# Patient Record
Sex: Female | Born: 1980 | Race: Black or African American | Hispanic: No | Smoking: Current every day smoker
Health system: Southern US, Community
[De-identification: ages and names within clinical notes are randomized; demographics above are authoritative.]

## PROBLEM LIST (undated history)

## (undated) DIAGNOSIS — Z8739 Personal history of other diseases of the musculoskeletal system and connective tissue: Secondary | ICD-10-CM

## (undated) DIAGNOSIS — Z862 Personal history of diseases of the blood and blood-forming organs and certain disorders involving the immune mechanism: Secondary | ICD-10-CM

## (undated) DIAGNOSIS — Z720 Tobacco use: Secondary | ICD-10-CM

## (undated) DIAGNOSIS — F1010 Alcohol abuse, uncomplicated: Secondary | ICD-10-CM

## (undated) DIAGNOSIS — E876 Hypokalemia: Secondary | ICD-10-CM

## (undated) DIAGNOSIS — I428 Other cardiomyopathies: Secondary | ICD-10-CM

## (undated) DIAGNOSIS — I4729 Other ventricular tachycardia: Secondary | ICD-10-CM

## (undated) DIAGNOSIS — D72819 Decreased white blood cell count, unspecified: Secondary | ICD-10-CM

## (undated) DIAGNOSIS — D709 Neutropenia, unspecified: Secondary | ICD-10-CM

## (undated) DIAGNOSIS — J45909 Unspecified asthma, uncomplicated: Secondary | ICD-10-CM

## (undated) DIAGNOSIS — R7401 Elevation of levels of liver transaminase levels: Secondary | ICD-10-CM

## (undated) DIAGNOSIS — I469 Cardiac arrest, cause unspecified: Secondary | ICD-10-CM

## (undated) DIAGNOSIS — Z9289 Personal history of other medical treatment: Secondary | ICD-10-CM

## (undated) HISTORY — DX: Personal history of other diseases of the musculoskeletal system and connective tissue: Z87.39

## (undated) HISTORY — DX: Personal history of other medical treatment: Z92.89

## (undated) HISTORY — DX: Decreased white blood cell count, unspecified: D72.819

## (undated) HISTORY — DX: Unspecified asthma, uncomplicated: J45.909

## (undated) HISTORY — DX: Cardiac arrest, cause unspecified: I46.9

## (undated) HISTORY — DX: Neutropenia, unspecified: D70.9

## (undated) HISTORY — DX: Personal history of diseases of the blood and blood-forming organs and certain disorders involving the immune mechanism: Z86.2

## (undated) HISTORY — DX: Other cardiomyopathies: I42.8

## (undated) MED FILL — Iron Sucrose Inj 20 MG/ML (Fe Equiv): INTRAVENOUS | Qty: 10 | Status: AC

---
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
## 2013-07-08 ENCOUNTER — Emergency Department: Payer: Self-pay | Admitting: Emergency Medicine

## 2014-09-27 ENCOUNTER — Ambulatory Visit
Admit: 2014-09-27 | Disposition: A | Discharge status: Home / Self Care | Payer: Self-pay | Attending: Internal Medicine | Admitting: Internal Medicine

## 2014-10-11 ENCOUNTER — Ambulatory Visit
Admit: 2014-10-11 | Disposition: A | Discharge status: Home / Self Care | Payer: Self-pay | Attending: Internal Medicine | Admitting: Internal Medicine

## 2015-01-29 ENCOUNTER — Inpatient Hospital Stay: Payer: Medicaid Other

## 2015-02-03 ENCOUNTER — Inpatient Hospital Stay: Payer: Medicaid Other | Attending: Internal Medicine

## 2015-02-03 ENCOUNTER — Other Ambulatory Visit: Payer: Self-pay

## 2015-02-03 DIAGNOSIS — D72819 Decreased white blood cell count, unspecified: Secondary | ICD-10-CM | POA: Insufficient documentation

## 2015-03-06 ENCOUNTER — Other Ambulatory Visit: Payer: Medicaid Other

## 2015-03-20 ENCOUNTER — Inpatient Hospital Stay: Payer: Medicaid Other | Attending: Internal Medicine

## 2015-03-20 DIAGNOSIS — D709 Neutropenia, unspecified: Secondary | ICD-10-CM | POA: Diagnosis not present

## 2015-03-20 DIAGNOSIS — R700 Elevated erythrocyte sedimentation rate: Secondary | ICD-10-CM | POA: Insufficient documentation

## 2015-03-20 DIAGNOSIS — D72819 Decreased white blood cell count, unspecified: Secondary | ICD-10-CM | POA: Insufficient documentation

## 2015-03-20 LAB — CBC
HCT: 42.1 % (ref 35.0–47.0)
HEMOGLOBIN: 14 g/dL (ref 12.0–16.0)
MCH: 30 pg (ref 26.0–34.0)
MCHC: 33.2 g/dL (ref 32.0–36.0)
MCV: 90.3 fL (ref 80.0–100.0)
Platelets: 144 10*3/uL — ABNORMAL LOW (ref 150–440)
RBC: 4.66 MIL/uL (ref 3.80–5.20)
RDW: 13.9 % (ref 11.5–14.5)
WBC: 2.4 10*3/uL — AB (ref 3.6–11.0)

## 2015-03-20 LAB — LACTATE DEHYDROGENASE: LDH: 228 U/L — AB (ref 98–192)

## 2015-03-26 ENCOUNTER — Telehealth: Payer: Self-pay | Admitting: *Deleted

## 2015-03-26 NOTE — Telephone Encounter (Signed)
Labs from 03/20/15 continues to show mild leukopenia, looks like differential was not done. Also serum LDH is just above normal range but is better than March 2016. If patient is clinically doing well without any fevers, night sweats, unintentional weight loss, enlarged lymph node masses on self exam, recurrent infections, then plan is continued monitoring and keep scheduled appointments the same. Otherwise let me know and we will need to see her. Thanks.

## 2015-03-27 NOTE — Telephone Encounter (Signed)
I will need to see her and discuss getting workup (bone marrow biopsy and CT scan) to r/o lymphoma. Please make appt for tomorrow. Thanks.

## 2015-03-27 NOTE — Telephone Encounter (Signed)
Pt reports that she is having "alot and I mean a lot of night sweats. I have to get up and shower. I am  Also itching a lot" Denies fevers.

## 2015-03-27 NOTE — Telephone Encounter (Signed)
Called and left message that her appt has been changed to tomorrow at 915 and asked that she return  My call to confirm

## 2015-03-27 NOTE — Telephone Encounter (Signed)
I have called patient again and still get vm

## 2015-03-27 NOTE — Telephone Encounter (Signed)
Patient reports that she has transportation issues and uses [facility]. I offered her a $10 gas voucher if she could get someone to bring her to the appt and she got her neighbor to agree to bring her

## 2015-03-28 ENCOUNTER — Other Ambulatory Visit: Payer: Medicaid Other

## 2015-03-28 ENCOUNTER — Inpatient Hospital Stay: Payer: Medicaid Other

## 2015-03-28 ENCOUNTER — Inpatient Hospital Stay: Payer: Medicaid Other | Admitting: Internal Medicine

## 2015-03-28 ENCOUNTER — Ambulatory Visit: Payer: Medicaid Other | Admitting: Internal Medicine

## 2015-04-02 ENCOUNTER — Ambulatory Visit: Payer: Medicaid Other | Admitting: Internal Medicine

## 2015-04-02 ENCOUNTER — Other Ambulatory Visit: Payer: Medicaid Other

## 2015-04-18 ENCOUNTER — Inpatient Hospital Stay: Payer: Medicaid Other | Attending: Internal Medicine | Admitting: Internal Medicine

## 2015-04-18 ENCOUNTER — Inpatient Hospital Stay: Payer: Medicaid Other

## 2015-04-18 VITALS — BP 108/76 | HR 79 | Temp 96.7°F | Resp 18 | Ht 62.0 in | Wt 95.9 lb

## 2015-04-18 DIAGNOSIS — R700 Elevated erythrocyte sedimentation rate: Secondary | ICD-10-CM | POA: Insufficient documentation

## 2015-04-18 DIAGNOSIS — Z79899 Other long term (current) drug therapy: Secondary | ICD-10-CM | POA: Insufficient documentation

## 2015-04-18 DIAGNOSIS — D890 Polyclonal hypergammaglobulinemia: Secondary | ICD-10-CM

## 2015-04-18 DIAGNOSIS — F1010 Alcohol abuse, uncomplicated: Secondary | ICD-10-CM | POA: Insufficient documentation

## 2015-04-18 DIAGNOSIS — D649 Anemia, unspecified: Secondary | ICD-10-CM | POA: Insufficient documentation

## 2015-04-18 DIAGNOSIS — M303 Mucocutaneous lymph node syndrome [Kawasaki]: Secondary | ICD-10-CM | POA: Diagnosis not present

## 2015-04-18 DIAGNOSIS — R740 Nonspecific elevation of levels of transaminase and lactic acid dehydrogenase [LDH]: Secondary | ICD-10-CM | POA: Insufficient documentation

## 2015-04-18 DIAGNOSIS — R918 Other nonspecific abnormal finding of lung field: Secondary | ICD-10-CM | POA: Insufficient documentation

## 2015-04-18 DIAGNOSIS — J45909 Unspecified asthma, uncomplicated: Secondary | ICD-10-CM | POA: Insufficient documentation

## 2015-04-18 DIAGNOSIS — F17210 Nicotine dependence, cigarettes, uncomplicated: Secondary | ICD-10-CM | POA: Insufficient documentation

## 2015-04-18 DIAGNOSIS — R634 Abnormal weight loss: Secondary | ICD-10-CM

## 2015-04-18 DIAGNOSIS — R61 Generalized hyperhidrosis: Secondary | ICD-10-CM | POA: Insufficient documentation

## 2015-04-18 DIAGNOSIS — D72819 Decreased white blood cell count, unspecified: Secondary | ICD-10-CM | POA: Insufficient documentation

## 2015-04-18 LAB — HEPATIC FUNCTION PANEL
ALBUMIN: 4.3 g/dL (ref 3.5–5.0)
ALT: 42 U/L (ref 14–54)
AST: 121 U/L — AB (ref 15–41)
Alkaline Phosphatase: 75 U/L (ref 38–126)
Bilirubin, Direct: 0.2 mg/dL (ref 0.1–0.5)
Indirect Bilirubin: 0.3 mg/dL (ref 0.3–0.9)
TOTAL PROTEIN: 8.1 g/dL (ref 6.5–8.1)
Total Bilirubin: 0.5 mg/dL (ref 0.3–1.2)

## 2015-04-18 LAB — LACTATE DEHYDROGENASE: LDH: 192 U/L (ref 98–192)

## 2015-04-18 LAB — BASIC METABOLIC PANEL
ANION GAP: 8 (ref 5–15)
BUN: 6 mg/dL (ref 6–20)
CALCIUM: 8.4 mg/dL — AB (ref 8.9–10.3)
CO2: 25 mmol/L (ref 22–32)
CREATININE: 0.57 mg/dL (ref 0.44–1.00)
Chloride: 101 mmol/L (ref 101–111)
GFR calc Af Amer: >60 mL/min (ref >60)
GLUCOSE: 82 mg/dL (ref 65–99)
Potassium: 3.9 mmol/L (ref 3.5–5.1)
Sodium: 134 mmol/L — ABNORMAL LOW (ref 135–145)

## 2015-04-18 LAB — CBC WITH DIFFERENTIAL/PLATELET
BASOS ABS: 0 10*3/uL (ref 0–0.1)
Basophils Relative: 1 %
EOS ABS: 0.1 10*3/uL (ref 0–0.7)
Eosinophils Relative: 3 %
HCT: 38.7 % (ref 35.0–47.0)
HEMOGLOBIN: 13 g/dL (ref 12.0–16.0)
LYMPHS ABS: 0.7 10*3/uL — AB (ref 1.0–3.6)
Lymphocytes Relative: 31 %
MCH: 29.9 pg (ref 26.0–34.0)
MCHC: 33.5 g/dL (ref 32.0–36.0)
MCV: 89.1 fL (ref 80.0–100.0)
Monocytes Absolute: 0.3 10*3/uL (ref 0.2–0.9)
Monocytes Relative: 16 %
NEUTROS PCT: 49 %
Neutro Abs: 1.1 10*3/uL — ABNORMAL LOW (ref 1.4–6.5)
Platelets: 128 10*3/uL — ABNORMAL LOW (ref 150–440)
RBC: 4.34 MIL/uL (ref 3.80–5.20)
RDW: 13.9 % (ref 11.5–14.5)
WBC: 2.1 10*3/uL — AB (ref 3.6–11.0)

## 2015-04-18 LAB — FOLATE: FOLATE: 7.3 ng/mL (ref >5.9)

## 2015-04-18 LAB — IRON AND TIBC
IRON: 97 ug/dL (ref 28–170)
SATURATION RATIOS: 27 % (ref 10.4–31.8)
TIBC: 351 ug/dL (ref 250–450)
UIBC: 256 ug/dL

## 2015-04-18 LAB — FERRITIN: FERRITIN: 107 ng/mL (ref 11–307)

## 2015-04-18 LAB — RETICULOCYTES
RBC.: 4.34 MIL/uL (ref 3.80–5.20)
RETIC COUNT ABSOLUTE: 43.4 10*3/uL (ref 19.0–183.0)
RETIC CT PCT: 1 % (ref 0.4–3.1)

## 2015-04-18 LAB — SEDIMENTATION RATE: SED RATE: 5 mm/h (ref 0–20)

## 2015-04-18 LAB — PREGNANCY, URINE: Preg Test, Ur: NEGATIVE

## 2015-04-18 NOTE — Progress Notes (Signed)
[facility]  Telephone:([phone]) [phone] Fax:(336) [phone]     ID: [name] OB: [birth year]  MR#: [id number]  OZH#:[id number]  Patient Care Team: Colleen Can, MD as PCP - General (Internal Medicine)  CHIEF COMPLAINT/DIAGNOSIS:  Persistent unexplained Leukopenia, Mild Neutropenia. Also found to have mildly elevated ESR of 66, C-reactive protein at 6.3 and serum copper at 181 (ref range 72-166) -  possible etiologies include secondary to regular alcohol intake, ?possible component of constitutional leukopenia/neutropenia. 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 Serum ceruloplasmin level in normal range. 10/28/14 - US Abdomen. IMPRESSION: 1. No splenomegaly. No focal splenic abnormality. Normal abdominal ultrasound.  [prior labs on 06/17/14 reported WBC low at 2600, 55% neutrophils, ANC 1400, 33% lymphocytes, ALC 1900, 10% monos, AMC 300, 2% eos, 0% basos, Hb 13.0, MCV 79, platelets 239, HBsAg negative, HCV Ab negative,  ESR of 66, C-reactive protein at 6.3, serum copper at 181 (ref range 72-166), ANA negative. On 05/30/14, WBC 2200, Hb 13.1, platelets 220, ANC 1200, ALC low at 500, Cr 0.7, LFTs unremarkable, albumin 4.5, HIV Ab nonreactive. On 06/03/14, WBC 2400, Hb 12.1, platelets 208, ANC 1200, normal B12 and folate level].   HPI:   Patient returns for continued hematology follow-up, she had CBC done on September 8 which had continued to show elevated serum LDH along with persistent leukopenia. At that time, patient had been contacted over telephone and had reported she was having major night sweats, she could not come for follow-up appointment until today due to transportation issues. States that she continues to have severe night sweats, appetite is steady but she does not gain much weight, feels that she may have lost at least 6 pounds in the last few months.  She continues to take alcohol, mostly beer, on a regular basis. She has chronic fatigue on exertion. Denies any fevers, chills or recurrent infections. Denies any new skin rashes, joint redness or swelling although sometimes has some stiffness and discomfort in joints of her hands. No recurrent mouth sores or thrush.     Review of Systems   As in HPI above. In addition, no new headaches.  No earache or discharge. No sore throat or dysphagia. No abdominal pain, constipation, diarrhea, dysuria or hematuria. No new bone pain. Appetite is stable. No new paresthesias in extremities.    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 Otherwise remarkable for breast cancer. Denies hematological disorders including leukopenia/neutropenia.    Social History - smokes about 3 cigarettes per day 15 years now. Drinks beer every day, 40 oz and 24 oz cans. Denies recreational drug usage. Physically active.     ROS  No Known Allergies  No current outpatient prescriptions on file.   No current facility-administered medications for this visit.    PHYSICAL EXAM: Filed Vitals:   04/18/15 1211  BP: 108/76  Pulse: 79  Temp: 96.7 F (35.9 C)  Resp: 18     Body mass index is 17.54 kg/(m^2).      GENERAL: Patient is thin individual, alert and oriented and in no acute distress. There is no icterus. HEENT: EOMs intact. No cervical lymphadenopathy. CVS: S1S2, regular LUNGS: Bilaterally clear to auscultation, no rhonchi. ABDOMEN: Soft, nontender. No hepatosplenomegaly clinically.  NEURO: grossly nonfocal, cranial nerves are intact.   EXTREMITIES: No pedal edema. LYMPHATICS: No palpable adenopathy in axillary or inguinal areas.   LAB RESULTS: Lab Results  Component Value Date   WBC 2.1* 04/18/2015   NEUTROABS 1.1* 04/18/2015   HGB 13.0 04/18/2015    HCT 38.7 04/18/2015   MCV 89.1 04/18/2015   PLT 128* 04/18/2015   ANC 1.1, ALC 0.7   STUDIES: 06/17/14 - WBC low at 2600, 55% neutrophils, ANC 1400, 33% lymphocytes, ALC 1900, 10% monos, AMC 300, 2% eos, 0% basos, Hb 13.0, MCV 79, platelets 239, HBsAg negative, HCV Ab negative,  ESR of 66, C-reactive protein at 6.3, serum copper at 181 (ref range 72-166), ANA negative.  05/30/14 - WBC 2200, Hb 13.1, platelets 220, ANC 1200, ALC low at 500, Cr 0.7, LFTs unremarkable, albumin 4.5, HIV Ab nonreactive.  06/03/14 - WBC 2400, Hb 12.1, platelets 208, ANC 1200, normal B12 and folate level.    ASSESSMENT / PLAN:   1. Persistent unexplained Leukopenia, Mild Neutropenia. Also found to have mildly elevated ESR of 66, C-reactive protein at 6.3 and serum copper at 181 (ref range 72-166) -  possible etiologies include secondary to regular alcohol intake, possible component of constitutional leukopenia/neutropenia versus other. 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 Serum ceruloplasmin level in normal range. US Abdomen. IMPRESSION: 1. No splenomegaly. No focal splenic abnormality. Normal abdominal ultrasound. Peripheral blood flow cytometry was unremarkable   -     patient continues to have persistent leukopenia, mild neutropenia, also has mild thrombocytopenia. Have explained that these could be secondary to alcohol intake and have advised her to quit alcohol completely. Also, she has unexplained major night sweats and unintentional weight loss along with elevated LDH. Will send additional workup today, get CT scan of the chest/abdomen/pelvis to evaluate for possibility of underlying lymphoma, plan bone marrow biopsy next week to evaluate for lymphoma or other etiology. She will then return for follow-up in one week after bone marrow biopsy is done to discuss results of workup and make further plan of  management  2. H/o Elevated serum Copper level -  serum ceruloplasmin level was unremarkable. Repeat serum copper level in April 2016 normal at 154.  3. Also given elevated ESR and C-reactive protein along with prior history of Kawasaki's disease, recommend Rheumatology evaluation, patient states that she wants this to be determined by her primary physician.     4. In between visits, the patient has been advised to call or come to the ER in case of fevers, chills, bleeding, acute sickness or new symptoms.          Patient is agreeable to above plan.   Leia Alf, MD   04/18/2015 2:12 PM

## 2015-04-18 NOTE — Progress Notes (Signed)
Patient is here for follow-up of leukopenia. She states that she is very fatigued and stays this way. She states that her appetite is not that great and could be much better. She states that she has night sweats just about every night.

## 2015-04-19 LAB — VITAMIN B12: VITAMIN B 12: 487 pg/mL (ref 180–914)

## 2015-04-22 ENCOUNTER — Encounter: Payer: Self-pay | Admitting: *Deleted

## 2015-04-24 ENCOUNTER — Ambulatory Visit
Admission: RE | Admit: 2015-04-24 | Discharge: 2015-04-24 | Disposition: A | Discharge status: Home / Self Care | Payer: Medicaid Other | Source: Ambulatory Visit | Attending: Internal Medicine | Admitting: Internal Medicine

## 2015-04-24 DIAGNOSIS — D72819 Decreased white blood cell count, unspecified: Secondary | ICD-10-CM | POA: Insufficient documentation

## 2015-04-24 DIAGNOSIS — J439 Emphysema, unspecified: Secondary | ICD-10-CM | POA: Insufficient documentation

## 2015-04-24 DIAGNOSIS — R918 Other nonspecific abnormal finding of lung field: Secondary | ICD-10-CM | POA: Insufficient documentation

## 2015-04-24 MED ORDER — IOHEXOL 300 MG/ML  SOLN: 75.0000 mL | Freq: Once | INTRAMUSCULAR | Status: DC | PRN

## 2015-04-25 ENCOUNTER — Other Ambulatory Visit: Payer: Self-pay | Admitting: *Deleted

## 2015-04-25 ENCOUNTER — Inpatient Hospital Stay (HOSPITAL_BASED_OUTPATIENT_CLINIC_OR_DEPARTMENT_OTHER): Payer: Medicaid Other | Admitting: Internal Medicine

## 2015-04-25 ENCOUNTER — Inpatient Hospital Stay: Payer: Medicaid Other

## 2015-04-25 ENCOUNTER — Encounter: Payer: Self-pay | Admitting: Internal Medicine

## 2015-04-25 VITALS — Temp 97.8°F | Resp 16 | Ht 62.0 in | Wt 94.0 lb

## 2015-04-25 DIAGNOSIS — Z79899 Other long term (current) drug therapy: Secondary | ICD-10-CM

## 2015-04-25 DIAGNOSIS — D72819 Decreased white blood cell count, unspecified: Secondary | ICD-10-CM

## 2015-04-25 DIAGNOSIS — R740 Nonspecific elevation of levels of transaminase and lactic acid dehydrogenase [LDH]: Secondary | ICD-10-CM

## 2015-04-25 DIAGNOSIS — R700 Elevated erythrocyte sedimentation rate: Secondary | ICD-10-CM | POA: Diagnosis not present

## 2015-04-25 DIAGNOSIS — D649 Anemia, unspecified: Secondary | ICD-10-CM

## 2015-04-25 DIAGNOSIS — D708 Other neutropenia: Secondary | ICD-10-CM

## 2015-04-25 LAB — CBC WITH DIFFERENTIAL/PLATELET
Basophils Absolute: 0 10*3/uL (ref 0–0.1)
Basophils Relative: 1 %
EOS PCT: 2 %
Eosinophils Absolute: 0 10*3/uL (ref 0–0.7)
HEMATOCRIT: 41.9 % (ref 35.0–47.0)
HEMOGLOBIN: 13.9 g/dL (ref 12.0–16.0)
LYMPHS ABS: 0.7 10*3/uL — AB (ref 1.0–3.6)
LYMPHS PCT: 28 %
MCH: 29.5 pg (ref 26.0–34.0)
MCHC: 33.1 g/dL (ref 32.0–36.0)
MCV: 89.2 fL (ref 80.0–100.0)
Monocytes Absolute: 0.4 10*3/uL (ref 0.2–0.9)
Monocytes Relative: 17 %
NEUTROS ABS: 1.4 10*3/uL (ref 1.4–6.5)
NEUTROS PCT: 52 %
Platelets: 188 10*3/uL (ref 150–440)
RBC: 4.7 MIL/uL (ref 3.80–5.20)
RDW: 13.9 % (ref 11.5–14.5)
WBC: 2.5 10*3/uL — AB (ref 3.6–11.0)

## 2015-04-25 MED ORDER — LIDOCAINE HCL 2 % IJ SOLN
INTRAMUSCULAR | Status: AC
Start: 2015-04-25 — End: 2015-04-25
  Filled 2015-04-25: qty 20

## 2015-04-25 MED ORDER — HEPARIN SOD (PORK) LOCK FLUSH 100 UNIT/ML IV SOLN
INTRAVENOUS | Status: AC
  Filled 2015-04-25: qty 5

## 2015-04-25 NOTE — Progress Notes (Signed)
BONE MARROW BIOPSY/ASPIRATE PROGRESS NOTE  Yvette [name] presents for Bone Marrow biopsy per MD orders. Yvette [name] verbalized understanding of procedure. Consent reviewed and signed.  Cortni R Malkowski positioned supine for procedure. Time-out performed and Bone Marrow Checklist initiated.  Procedure aborted per MD orders. CBC has improved. CT scan stable. Bone marrow no longer needed.

## 2015-04-25 NOTE — Progress Notes (Signed)
[facility] OFFICE PROGRESS NOTE  Patient Care Team: Colleen Can, MD as PCP - General (Internal Medicine)   SUMMARY OF ONCOLOGIC HISTORY:  # NOV 2015 WBC- 2.2/ANC- 1200/ALC-500; [name]; ALC- 400. [HIV/HCV Ab-Neg]  # OCT 2016- Bil indeterminate lung nodules- repeat CT one year  # Hx of Kawasaki disease [age 37M & 13y]  # Alcohol abuse/Smoking  INTERVAL HISTORY:  A pleasant 34 year old African-American female patient with above history of chronic mild leukopenia/neutropenia is here to review the results of her CT scans that were done for concerns for lymphoma/also for a possible bone marrow biopsy.  Patient states that approximately 2-3 weeks ago she had symptoms of hot flashes/mild sweats. She denies any drenching night sweats no high-grade fevers. She states to have lost about 6 pounds in the last many months.  Her appetite is fair. She denies any frequent infections denies any admissions to the hospital on for antibiotics.   Unfortunately patient drinks alcohol; and also smokes. She denies any skin rashes; denies any other family history of blood problems.  REVIEW OF SYSTEMS:  A complete 10 point review of system is done which is negative except mentioned above/history of present illness.   PAST MEDICAL HISTORY :  Past Medical History  Diagnosis Date  . Leukopenia   . Neutropenia ([place])   . Asthma   . History of anemia   . History of blood transfusion 1984  . H/O Kawasaki's disease     as 60 month old child and at 83 years    PAST SURGICAL HISTORY :  No past surgical history on file.  FAMILY HISTORY :   Family History  Problem Relation Age of Onset  . Lupus Mother     SOCIAL HISTORY:   Social History  Substance Use Topics  . Smoking status: Current Every Day Smoker -- 1.00 packs/day for 15 years    Types: Cigarettes  . Smokeless tobacco: Never Used     Comment: 3 cigars  . Alcohol Use: 24.0 oz/week    40 Standard drinks  or equivalent per week     Comment: daily    ALLERGIES:  has No Known Allergies.  MEDICATIONS:  Current Outpatient Prescriptions  Medication Sig Dispense Refill  . PARoxetine (PAXIL) 20 MG tablet Take 20 mg by mouth daily. Patient states that she takes paxil intermittently for depression     No current facility-administered medications for this visit.    PHYSICAL EXAMINATION: ECOG PERFORMANCE STATUS: 0 - Asymptomatic  Temp(Src) 97.8 F (36.6 C) (Tympanic)  Resp 16  Ht $R'5\' 2"'aF$  (1.575 m)  Wt 94 lb (42.638 kg)  BMI 17.19 kg/m2  SpO2 100%  LMP 08/24/2014  Filed Weights   04/25/15 1106  Weight: 94 lb (42.638 kg)    GENERAL: Thin built ;well-developed; Alert, no distress and comfortable.  Alone; nervous. She smells alcohol. EYES: no pallor or icterus OROPHARYNX: no thrush or ulceration; good dentition  NECK: supple, no masses felt LYMPH:  no palpable lymphadenopathy in the cervical, axillary or inguinal regions LUNGS: clear to auscultation and  No wheeze or crackles HEART/CVS: regular rate & rhythm and no murmurs; No lower extremity edema ABDOMEN:abdomen soft, non-tender and normal bowel sounds Musculoskeletal:no cyanosis of digits and no clubbing  PSYCH: alert & oriented x 3 with fluent speech NEURO: no focal motor/sensory deficits SKIN:  no rashes or significant lesions  LABORATORY DATA:  I have reviewed the data as listed    Component Value Date/Time  NA 134* 04/18/2015 1400   K 3.9 04/18/2015 1400   CL 101 04/18/2015 1400   CO2 25 04/18/2015 1400   GLUCOSE 82 04/18/2015 1400   BUN 6 04/18/2015 1400   CREATININE 0.57 04/18/2015 1400   CALCIUM 8.4* 04/18/2015 1400   PROT 8.1 04/18/2015 1400   ALBUMIN 4.3 04/18/2015 1400   AST 121* 04/18/2015 1400   ALT 42 04/18/2015 1400   ALKPHOS 75 04/18/2015 1400   BILITOT 0.5 04/18/2015 1400   GFRNONAA >60 04/18/2015 1400   GFRAA >60 04/18/2015 1400    No results found for: SPEP, UPEP  Lab Results  Component Value  Date   WBC 2.5* 04/25/2015   NEUTROABS 1.4 04/25/2015   HGB 13.9 04/25/2015   HCT 41.9 04/25/2015   MCV 89.2 04/25/2015   PLT 188 04/25/2015      Chemistry      Component Value Date/Time   NA 134* 04/18/2015 1400   K 3.9 04/18/2015 1400   CL 101 04/18/2015 1400   CO2 25 04/18/2015 1400   BUN 6 04/18/2015 1400   CREATININE 0.57 04/18/2015 1400      Component Value Date/Time   CALCIUM 8.4* 04/18/2015 1400   ALKPHOS 75 04/18/2015 1400   AST 121* 04/18/2015 1400   ALT 42 04/18/2015 1400   BILITOT 0.5 04/18/2015 1400       RADIOGRAPHIC STUDIES: I have personally reviewed the radiological images as listed and agreed with the findings in the report. Ct Chest W Contrast  04/24/2015  CLINICAL DATA:  Night sweats. Leukopenia and thrombocytopenia. Unintentional weight loss of 10 pounds. Abdominal pain. EXAM: CT CHEST, ABDOMEN, AND PELVIS WITH CONTRAST TECHNIQUE: Multidetector CT imaging of the chest, abdomen and pelvis was performed following the standard protocol during bolus administration of intravenous contrast. CONTRAST:  80 mL Omnipaque 300 COMPARISON:  None. FINDINGS: CT CHEST FINDINGS Mediastinum/Lymph Nodes: No masses, pathologically enlarged lymph nodes, or other significant abnormality. Lungs/Pleura: No evidence of pleural effusion. Mild emphysema noted. Mild peripheral scarring seen in both upper lobes. Scattered tiny bilateral pulmonary nodules are noted,, with largest index nodules measuring 4 mm in the anterior left upper lobe on image 21 and 4 mm in the right lower lobe on image 25. No evidence of pulmonary consolidation. Musculoskeletal: No chest wall mass or suspicious bone lesions identified. CT ABDOMEN PELVIS FINDINGS Hepatobiliary: No masses or other significant abnormality. Pancreas: No mass, inflammatory changes, or other significant abnormality. Spleen: Within normal limits in size and appearance. Adrenals/Urinary Tract: No masses identified. No evidence of  hydronephrosis. Stomach/Bowel: No evidence of obstruction, inflammatory process, or abnormal fluid collections. Vascular/Lymphatic: No pathologically enlarged lymph nodes. No evidence of abdominal aortic aneursym. Reproductive: No mass or other significant abnormality. Other: None. Musculoskeletal:  No suspicious bone lesions identified. IMPRESSION: No lymphadenopathy or other soft tissue masses identified. No evidence of splenomegaly. Mild emphysema. Tiny indeterminate bilateral pulmonary nodules measuring up to 4 mm. If the patient is at high risk for bronchogenic carcinoma, follow-up chest CT at 1 year is recommended. If the patient is at low risk, no follow-up is needed. This recommendation follows the consensus statement: Guidelines for Management of Small Pulmonary Nodules Detected on CT Scans: A Statement from the [employer] as published in Radiology 2005; 237:395-400. Electronically Signed   By: Earle Gell M.D.   On: 04/24/2015 12:36   Ct Abdomen Pelvis W Contrast  04/24/2015  CLINICAL DATA:  Night sweats. Leukopenia and thrombocytopenia. Unintentional weight loss of 10 pounds. Abdominal pain. EXAM: CT  CHEST, ABDOMEN, AND PELVIS WITH CONTRAST TECHNIQUE: Multidetector CT imaging of the chest, abdomen and pelvis was performed following the standard protocol during bolus administration of intravenous contrast. CONTRAST:  80 mL Omnipaque 300 COMPARISON:  None. FINDINGS: CT CHEST FINDINGS Mediastinum/Lymph Nodes: No masses, pathologically enlarged lymph nodes, or other significant abnormality. Lungs/Pleura: No evidence of pleural effusion. Mild emphysema noted. Mild peripheral scarring seen in both upper lobes. Scattered tiny bilateral pulmonary nodules are noted,, with largest index nodules measuring 4 mm in the anterior left upper lobe on image 21 and 4 mm in the right lower lobe on image 25. No evidence of pulmonary consolidation. Musculoskeletal: No chest wall mass or suspicious bone lesions  identified. CT ABDOMEN PELVIS FINDINGS Hepatobiliary: No masses or other significant abnormality. Pancreas: No mass, inflammatory changes, or other significant abnormality. Spleen: Within normal limits in size and appearance. Adrenals/Urinary Tract: No masses identified. No evidence of hydronephrosis. Stomach/Bowel: No evidence of obstruction, inflammatory process, or abnormal fluid collections. Vascular/Lymphatic: No pathologically enlarged lymph nodes. No evidence of abdominal aortic aneursym. Reproductive: No mass or other significant abnormality. Other: None. Musculoskeletal:  No suspicious bone lesions identified. IMPRESSION: No lymphadenopathy or other soft tissue masses identified. No evidence of splenomegaly. Mild emphysema. Tiny indeterminate bilateral pulmonary nodules measuring up to 4 mm. If the patient is at high risk for bronchogenic carcinoma, follow-up chest CT at 1 year is recommended. If the patient is at low risk, no follow-up is needed. This recommendation follows the consensus statement: Guidelines for Management of Small Pulmonary Nodules Detected on CT Scans: A Statement from the [place] as published in Radiology 2005; 237:395-400. Electronically Signed   By: Earle Gell M.D.   On: 04/24/2015 12:36     ASSESSMENT & PLAN:   # MILD LEUKOPENIA/NEUTROPENIA-likely secondary to her African-American descent;  Most recent white count is 2.5/absolute neutrophil count of 1400. This is fairly chronic for the last 1 year with slight intermittent worsening. Patient is fairly symptomatic; no frequent infections.   I reviewed the extensive workup done at previous visits including HIV which were negative.  # Mild hot flashes with no significant sweating- question related to Depo-Provera. CT scan-no evidence of any lymphadenopathy or splenomegaly or hepatomegaly NOT suggestive of a lymphoma like process.  # Subcentimeter tiny bilateral lung nodules- recommend CT scan in 1 year  follow-up.  # Alcohol abuse/smoking- discussed multiple times with the patient to quit smoking/abstain alcohol use.  Patient will have labs on monthly basis; follow-up in 3 months.  No orders of the defined types were placed in this encounter.   All questions were answered. The patient knows to call the clinic with any problems, questions or concerns. No barriers to learning was detected. I spent 25 minutes counseling the patient face to face. The total time spent in the appointment was 30 minutes and more than 50% was on counseling and review of test results     Cammie Sickle, MD 04/25/2015 11:45 AM

## 2015-05-02 ENCOUNTER — Other Ambulatory Visit: Payer: Medicaid Other

## 2015-05-02 ENCOUNTER — Ambulatory Visit: Payer: Medicaid Other | Admitting: Internal Medicine

## 2015-05-22 ENCOUNTER — Inpatient Hospital Stay: Payer: Medicaid Other | Attending: Family Medicine

## 2015-05-22 DIAGNOSIS — D72819 Decreased white blood cell count, unspecified: Secondary | ICD-10-CM | POA: Insufficient documentation

## 2015-05-22 DIAGNOSIS — D649 Anemia, unspecified: Secondary | ICD-10-CM | POA: Insufficient documentation

## 2015-05-22 DIAGNOSIS — R740 Nonspecific elevation of levels of transaminase and lactic acid dehydrogenase [LDH]: Secondary | ICD-10-CM | POA: Insufficient documentation

## 2015-05-22 DIAGNOSIS — R700 Elevated erythrocyte sedimentation rate: Secondary | ICD-10-CM | POA: Insufficient documentation

## 2015-05-22 LAB — CBC WITH DIFFERENTIAL/PLATELET
BASOS ABS: 0 10*3/uL (ref 0–0.1)
BASOS PCT: 1 %
Eosinophils Absolute: 0 10*3/uL (ref 0–0.7)
Eosinophils Relative: 2 %
HEMATOCRIT: 38.2 % (ref 35.0–47.0)
Hemoglobin: 12.9 g/dL (ref 12.0–16.0)
Lymphocytes Relative: 33 %
Lymphs Abs: 0.7 10*3/uL — ABNORMAL LOW (ref 1.0–3.6)
MCH: 30 pg (ref 26.0–34.0)
MCHC: 33.6 g/dL (ref 32.0–36.0)
MCV: 89.3 fL (ref 80.0–100.0)
MONO ABS: 0.4 10*3/uL (ref 0.2–0.9)
Monocytes Relative: 18 %
NEUTROS ABS: 1 10*3/uL — AB (ref 1.4–6.5)
Neutrophils Relative %: 46 %
PLATELETS: 172 10*3/uL (ref 150–440)
RBC: 4.28 MIL/uL (ref 3.80–5.20)
RDW: 14.2 % (ref 11.5–14.5)
WBC: 2.1 10*3/uL — ABNORMAL LOW (ref 3.6–11.0)

## 2015-05-23 ENCOUNTER — Other Ambulatory Visit: Payer: Medicaid Other

## 2015-06-20 ENCOUNTER — Inpatient Hospital Stay: Payer: Medicaid Other | Attending: Family Medicine

## 2015-06-20 DIAGNOSIS — R700 Elevated erythrocyte sedimentation rate: Secondary | ICD-10-CM | POA: Insufficient documentation

## 2015-06-20 DIAGNOSIS — R740 Nonspecific elevation of levels of transaminase and lactic acid dehydrogenase [LDH]: Secondary | ICD-10-CM | POA: Diagnosis not present

## 2015-06-20 DIAGNOSIS — D708 Other neutropenia: Secondary | ICD-10-CM

## 2015-06-20 DIAGNOSIS — D649 Anemia, unspecified: Secondary | ICD-10-CM | POA: Insufficient documentation

## 2015-06-20 DIAGNOSIS — D72819 Decreased white blood cell count, unspecified: Secondary | ICD-10-CM | POA: Diagnosis not present

## 2015-06-20 LAB — CBC WITH DIFFERENTIAL/PLATELET
Basophils Absolute: 0 10*3/uL (ref 0–0.1)
Basophils Relative: 1 %
Eosinophils Absolute: 0.1 10*3/uL (ref 0–0.7)
Eosinophils Relative: 2 %
HCT: 41.6 % (ref 35.0–47.0)
Hemoglobin: 13.9 g/dL (ref 12.0–16.0)
Lymphocytes Relative: 29 %
Lymphs Abs: 0.9 10*3/uL — ABNORMAL LOW (ref 1.0–3.6)
MCH: 29.8 pg (ref 26.0–34.0)
MCHC: 33.4 g/dL (ref 32.0–36.0)
MCV: 89.3 fL (ref 80.0–100.0)
Monocytes Absolute: 0.6 10*3/uL (ref 0.2–0.9)
Monocytes Relative: 18 %
Neutro Abs: 1.6 10*3/uL (ref 1.4–6.5)
Neutrophils Relative %: 50 %
Platelets: 185 10*3/uL (ref 150–440)
RBC: 4.66 MIL/uL (ref 3.80–5.20)
RDW: 14.4 % (ref 11.5–14.5)
WBC: 3.2 10*3/uL — ABNORMAL LOW (ref 3.6–11.0)

## 2015-06-20 NOTE — Addendum Note (Signed)
Addended by: Gala Murdoch on: 06/20/2015 09:59 AM   Modules accepted: Orders

## 2015-07-18 ENCOUNTER — Other Ambulatory Visit: Payer: Medicaid Other

## 2015-07-18 ENCOUNTER — Ambulatory Visit: Payer: Medicaid Other | Admitting: Internal Medicine

## 2015-07-25 ENCOUNTER — Inpatient Hospital Stay (HOSPITAL_BASED_OUTPATIENT_CLINIC_OR_DEPARTMENT_OTHER): Payer: Medicaid Other | Admitting: Internal Medicine

## 2015-07-25 ENCOUNTER — Inpatient Hospital Stay: Payer: Medicaid Other | Attending: Internal Medicine

## 2015-07-25 ENCOUNTER — Encounter: Payer: Self-pay | Admitting: Internal Medicine

## 2015-07-25 VITALS — BP 119/74 | HR 86 | Temp 98.2°F | Resp 6 | Ht 62.0 in | Wt 99.2 lb

## 2015-07-25 DIAGNOSIS — D708 Other neutropenia: Secondary | ICD-10-CM

## 2015-07-25 DIAGNOSIS — F1010 Alcohol abuse, uncomplicated: Secondary | ICD-10-CM

## 2015-07-25 DIAGNOSIS — R918 Other nonspecific abnormal finding of lung field: Secondary | ICD-10-CM | POA: Diagnosis not present

## 2015-07-25 DIAGNOSIS — D72819 Decreased white blood cell count, unspecified: Secondary | ICD-10-CM | POA: Diagnosis not present

## 2015-07-25 DIAGNOSIS — F17210 Nicotine dependence, cigarettes, uncomplicated: Secondary | ICD-10-CM | POA: Insufficient documentation

## 2015-07-25 DIAGNOSIS — Z79899 Other long term (current) drug therapy: Secondary | ICD-10-CM | POA: Diagnosis not present

## 2015-07-25 DIAGNOSIS — J45909 Unspecified asthma, uncomplicated: Secondary | ICD-10-CM | POA: Insufficient documentation

## 2015-07-25 LAB — CBC WITH DIFFERENTIAL/PLATELET
BASOS PCT: 1 %
Basophils Absolute: 0 10*3/uL (ref 0–0.1)
Eosinophils Absolute: 0.1 10*3/uL (ref 0–0.7)
Eosinophils Relative: 3 %
HEMATOCRIT: 39 % (ref 35.0–47.0)
Hemoglobin: 13.1 g/dL (ref 12.0–16.0)
LYMPHS ABS: 0.6 10*3/uL — AB (ref 1.0–3.6)
Lymphocytes Relative: 28 %
MCH: 30 pg (ref 26.0–34.0)
MCHC: 33.5 g/dL (ref 32.0–36.0)
MCV: 89.6 fL (ref 80.0–100.0)
MONO ABS: 0.4 10*3/uL (ref 0.2–0.9)
MONOS PCT: 21 %
NEUTROS ABS: 0.9 10*3/uL — AB (ref 1.4–6.5)
Neutrophils Relative %: 47 %
Platelets: 197 10*3/uL (ref 150–440)
RBC: 4.36 MIL/uL (ref 3.80–5.20)
RDW: 14.4 % (ref 11.5–14.5)
WBC: 2 10*3/uL — ABNORMAL LOW (ref 3.6–11.0)

## 2015-07-25 NOTE — Progress Notes (Signed)
[facility] OFFICE PROGRESS NOTE  Patient Care Team: Suan Halter, MD as PCP - General (Internal Medicine)   SUMMARY OF ONCOLOGIC HISTORY:  # NOV 2015 WBC- 2.2/ANC- 1200/ALC-500; [name]; [name]  # OCT 2016- Bil indeterminate lung nodules- repeat CT  In July 2017  # Hx of Kawasaki disease [age 56M & 13y]  # Alcohol abuse/Smoking  INTERVAL HISTORY:  A pleasant 35 year-old African-American female patient with above history of chronic mild leukopenia/neutropenia is here for follow-up.   Patient had one episode of upper respiratory tract infection 4 which was prescribed antibiotics;  But she did not complete the course.  It resolved by itself.  No  Admissions to the hospital.  Unfortunately patient continues to drink alcohol; and also smokes.  No weight loss. No night sweats.   REVIEW OF SYSTEMS:  A complete 10 point review of system is done which is negative except mentioned above/history of present illness.   PAST MEDICAL HISTORY :  Past Medical History  Diagnosis Date  . Leukopenia   . Neutropenia ([facility])   . Asthma   . History of anemia   . History of blood transfusion 1984  . H/O Kawasaki's disease     as 66 month old child and at 18 years    PAST SURGICAL HISTORY :  History reviewed. No pertinent past surgical history.  FAMILY HISTORY :   Family History  Problem Relation Age of Onset  . Lupus Mother     SOCIAL HISTORY:   Social History  Substance Use Topics  . Smoking status: Current Every Day Smoker -- 1.00 packs/day for 15 years    Types: Cigarettes  . Smokeless tobacco: Never Used     Comment: 3 cigars  . Alcohol Use: 24.0 oz/week    40 Standard drinks or equivalent per week     Comment: daily    ALLERGIES:  has No Known Allergies.  MEDICATIONS:  Current Outpatient Prescriptions  Medication Sig Dispense Refill  . PARoxetine (PAXIL) 20 MG tablet Take 20  mg by mouth daily. Reported on 07/25/2015     No current facility-administered medications for this visit.    PHYSICAL EXAMINATION: ECOG PERFORMANCE STATUS: 0 - Asymptomatic  BP 119/74 mmHg  Pulse 86  Temp(Src) 98.2 F (36.8 C) (Tympanic)  Resp 6  Ht 5\' 2"  (1.575 m)  Wt 99 lb 3.3 oz (45 kg)  BMI 18.14 kg/m2  Filed Weights   07/25/15 1139  Weight: 99 lb 3.3 oz (45 kg)    GENERAL: Thin built ;well-developed; Alert, no distress and comfortable.  Alone; nervous. She is alone. EYES: no pallor or icterus OROPHARYNX: no thrush or ulceration; good dentition  NECK: supple, no masses felt LYMPH:  no palpable lymphadenopathy in the cervical, axillary or inguinal regions LUNGS: clear to auscultation and  No wheeze or crackles HEART/CVS: regular rate & rhythm and no murmurs; No lower extremity edema ABDOMEN:abdomen soft, non-tender and normal bowel sounds Musculoskeletal:no cyanosis of digits and no clubbing  PSYCH: alert & oriented x 3 with fluent speech NEURO: no focal motor/sensory deficits SKIN:  no rashes or significant lesions  LABORATORY DATA:  I have reviewed the data as listed    Component Value Date/Time   NA 134* 04/18/2015 1400   K 3.9 04/18/2015 1400   CL 101 04/18/2015 1400   CO2 25 04/18/2015 1400   GLUCOSE 82 04/18/2015 1400   BUN 6 04/18/2015 1400  CREATININE 0.57 04/18/2015 1400   CALCIUM 8.4* 04/18/2015 1400   PROT 8.1 04/18/2015 1400   ALBUMIN 4.3 04/18/2015 1400   AST 121* 04/18/2015 1400   ALT 42 04/18/2015 1400   ALKPHOS 75 04/18/2015 1400   BILITOT 0.5 04/18/2015 1400   GFRNONAA >60 04/18/2015 1400   GFRAA >60 04/18/2015 1400    No results found for: SPEP, UPEP  Lab Results  Component Value Date   WBC 2.0* 07/25/2015   NEUTROABS 0.9* 07/25/2015   HGB 13.1 07/25/2015   HCT 39.0 07/25/2015   MCV 89.6 07/25/2015   PLT 197 07/25/2015      Chemistry      Component Value Date/Time   NA 134* 04/18/2015 1400   K 3.9 04/18/2015 1400   CL 101  04/18/2015 1400   CO2 25 04/18/2015 1400   BUN 6 04/18/2015 1400   CREATININE 0.57 04/18/2015 1400      Component Value Date/Time   CALCIUM 8.4* 04/18/2015 1400   ALKPHOS 75 04/18/2015 1400   AST 121* 04/18/2015 1400   ALT 42 04/18/2015 1400   BILITOT 0.5 04/18/2015 1400       RADIOGRAPHIC STUDIES: I have personally reviewed the radiological images as listed and agreed with the findings in the report. No results found.   ASSESSMENT & PLAN:   # MILD LEUKOPENIA/NEUTROPENIA-likely secondary to her African-American descent Vs. Alcohol use; Today her white count is 2.0;  Absolute neutrophil count is 900;  Lymphocyte count as 600/ stable or slightly worse.  Normal hemoglobin platelets. Patient is fairly symptomatic; no frequent infections.   #  Small lung nodules subcentimeter noted on CT scan/ incidental in October 2016-  Follow-up scan in 6 months.  # Alcohol abuse/smoking- discussed multiple times with the patient to quit smoking/abstain alcohol use.  #  Patient asked regarding  Filing for disability/  I told the patient that from the blood  abnormalitystandpoint-  She does not qualify for disability.  She can talk to her PCP regarding further information.  #  Patient will follow-up in 6 months/labs;  With a scan prior to the next visit.       Earna Coder, MD 07/25/2015 11:54 AM

## 2015-07-25 NOTE — Progress Notes (Signed)
Patient here today for follow up for Leukopenia. States she has had increased fatigue. Reports cough and had an ear infection in December that she was given Amoxicillin that she did not finish the prescription. Denies any pain today.

## 2015-08-14 ENCOUNTER — Emergency Department: Payer: Medicaid Other

## 2015-08-14 ENCOUNTER — Emergency Department
Admission: EM | Admit: 2015-08-14 | Discharge: 2015-08-14 | Disposition: A | Discharge status: Home / Self Care | Payer: Medicaid Other | Attending: Emergency Medicine | Admitting: Emergency Medicine

## 2015-08-14 ENCOUNTER — Encounter: Payer: Self-pay | Admitting: Emergency Medicine

## 2015-08-14 DIAGNOSIS — Z79899 Other long term (current) drug therapy: Secondary | ICD-10-CM | POA: Insufficient documentation

## 2015-08-14 DIAGNOSIS — M79642 Pain in left hand: Secondary | ICD-10-CM | POA: Diagnosis present

## 2015-08-14 DIAGNOSIS — L089 Local infection of the skin and subcutaneous tissue, unspecified: Secondary | ICD-10-CM | POA: Insufficient documentation

## 2015-08-14 DIAGNOSIS — F17210 Nicotine dependence, cigarettes, uncomplicated: Secondary | ICD-10-CM | POA: Insufficient documentation

## 2015-08-14 DIAGNOSIS — B9689 Other specified bacterial agents as the cause of diseases classified elsewhere: Secondary | ICD-10-CM

## 2015-08-14 MED ORDER — SULFAMETHOXAZOLE-TRIMETHOPRIM 800-160 MG PO TABS: 1.0000 | ORAL_TABLET | Freq: Two times a day (BID) | ORAL | Status: DC

## 2015-08-14 MED ORDER — IBUPROFEN 800 MG PO TABS: 800.0000 mg | ORAL_TABLET | Freq: Three times a day (TID) | ORAL | Status: DC | PRN

## 2015-08-14 NOTE — ED Notes (Signed)
States she is not sure what she did to her left index finger   Pain with some swelling since saturday

## 2015-08-14 NOTE — ED Provider Notes (Signed)
[facility] Emergency Department Provider Note  ____________________________________________  Time seen: Approximately 11:48 AM  I have reviewed the triage vital signs and the nursing notes.   HISTORY  Chief Complaint Hand Pain    HPI Yvette [name] is a 35 y.o. female patient complaining of pain, edema, and erythema second digit left hand. Patient states she was at a party 4 days ago and struck her finger against something but did not have pain until the next day. The last 2 days patient is nose erythema and increasing edema to the distal phalange. Patient state decreased range of motion with flexion of the finger. Patient rates the pain as a 5/10. No palliative measures taken for this complaint.   Past Medical History  Diagnosis Date  . Leukopenia   . Neutropenia (HCC)   . Asthma   . History of anemia   . History of blood transfusion 1984  . 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 Leukopenia 02/03/2015    History reviewed. No pertinent past surgical history.  Current Outpatient Rx  Name  Route  Sig  Dispense  Refill  . ibuprofen (ADVIL,MOTRIN) 800 MG tablet   Oral   Take 1 tablet (800 mg total) by mouth every 8 (eight) hours as needed.   30 tablet   0   . PARoxetine (PAXIL) 20 MG tablet   Oral   Take 20 mg by mouth daily. Reported on 07/25/2015         . sulfamethoxazole-trimethoprim (BACTRIM DS,SEPTRA DS) 800-160 MG tablet   Oral   Take 1 tablet by mouth 2 (two) times daily.   20 tablet   0     Allergies Review of patient's allergies indicates no known allergies.  Family History  Problem Relation Age of Onset  . Lupus Mother     Social History Social History  Substance Use Topics  . Smoking status: Current Every Day Smoker -- 1.00 packs/day for 15 years    Types: Cigarettes  . Smokeless tobacco: Never Used     Comment: 3 cigars  . Alcohol  Use: 24.0 oz/week    40 Standard drinks or equivalent per week     Comment: daily    Review of Systems Constitutional: No fever/chills Eyes: No visual changes. ENT: No sore throat. Cardiovascular: Denies chest pain. Respiratory: Denies shortness of breath. Gastrointestinal: No abdominal pain.  No nausea, no vomiting.  No diarrhea.  No constipation. Genitourinary: Negative for dysuria. Musculoskeletal: Negative for back pain. Skin: Negative for rash. Swelling and redness is secondary to left hand. Neurological: Negative for headaches, focal weakness or numbness.   ____________________________________________   PHYSICAL EXAM:  VITAL SIGNS: ED Triage Vitals  Enc Vitals Group     BP --      Pulse --      Resp --      Temp --      Temp src --      SpO2 --      Weight --      Height --      Head Cir --      Peak Flow --      Pain Score 08/14/15 1130 5     Pain Loc --      Pain Edu? --      Excl. in GC? --     Constitutional: Alert  and oriented. Well appearing and in no acute distress. Eyes: Conjunctivae are normal. PERRL. EOMI. Head: Atraumatic. Nose: No congestion/rhinnorhea. Mouth/Throat: Mucous membranes are moist.  Oropharynx non-erythematous. Neck: No stridor.  No cervical spine tenderness to palpation. Hematological/Lymphatic/Immunilogical: No cervical lymphadenopathy. Cardiovascular: Normal rate, regular rhythm. Grossly normal heart sounds.  Good peripheral circulation. Respiratory: Normal respiratory effort.  No retractions. Lungs CTAB. Gastrointestinal: Soft and nontender. No distention. No abdominal bruits. No CVA tenderness. Musculoskeletal: No lower extremity tenderness nor edema.  No joint effusions. Neurologic:  Normal speech and language. No gross focal neurologic deficits are appreciated. No gait instability. Skin:  Skin is warm, dry and intact. No rash noted. Psychiatric: Mood and affect are normal. Speech and behavior are  normal.  ____________________________________________   LABS (all labs ordered are listed, but only abnormal results are displayed)  Labs Reviewed - No data to display ____________________________________________  EKG   ____________________________________________  RADIOLOGY   ____________________________________________   PROCEDURES  Procedure(s) performed: None  Critical Care performed: No  ____________________________________________   INITIAL IMPRESSION / ASSESSMENT AND PLAN / ED COURSE  Pertinent labs & imaging results that were available during my care of the patient were reviewed by me and considered in my medical decision making (see chart for details).  Cellulitis secondary to left hand. Patient given discharge care instructions. Patient given a prescription for Bactrim and ibuprofen. Patient advised follow-up family doctor if no improvement or worsening condition. ____________________________________________   FINAL CLINICAL IMPRESSION(S) / ED DIAGNOSES  Final diagnoses:  Bacterial skin infection      Joni Reining, PA-C 08/14/15 1211  Minna Antis, MD 08/14/15 1504

## 2015-08-14 NOTE — Discharge Instructions (Signed)
Fingertip Infection When an infection is around the nail, it is called a paronychia. When it appears over the tip of the finger, it is called a felon. These infections are due to minor injuries or cracks in the skin. If they are not treated properly, they can lead to bone infection and permanent damage to the fingernail. Incision and drainage is necessary if a pus pocket (an abscess) has formed. Antibiotics and pain medicine may also be needed. Keep your hand elevated for the next 2-3 days to reduce swelling and pain. If a pack was placed in the abscess, it should be removed in 1-2 days by your caregiver. Soak the finger in warm water for 20 minutes 4 times daily to help promote drainage. Keep the hands as dry as possible. Wear protective gloves with cotton liners. See your caregiver for follow-up care as recommended.  HOME CARE INSTRUCTIONS   Keep wound clean, dry and dressed as suggested by your caregiver.  Soak in warm salt water for fifteen minutes, four times per day for bacterial infections.  Your caregiver will prescribe an antibiotic if a bacterial infection is suspected. Take antibiotics as directed and finish the prescription, even if the problem appears to be improving before the medicine is gone.  Only take over-the-counter or prescription medicines for pain, discomfort, or fever as directed by your caregiver. SEEK IMMEDIATE MEDICAL CARE IF:  There is redness, swelling, or increasing pain in the wound.  Pus or any other unusual drainage is coming from the wound.  An unexplained oral temperature above 102 F (38.9 C) develops.  You notice a foul smell coming from the wound or dressing. MAKE SURE YOU:   Understand these instructions.  Monitor your condition.  Contact your caregiver if you are getting worse or not improving.   This information is not intended to replace advice given to you by your health care provider. Make sure you discuss any questions you have with your  health care provider.   Document Released: 08/05/2004 Document Revised: 09/20/2011 Document Reviewed: 12/16/2014 Elsevier Interactive Patient Education 2016 Elsevier Inc[employer]  

## 2015-08-22 ENCOUNTER — Emergency Department
Admission: EM | Admit: 2015-08-22 | Discharge: 2015-08-22 | Disposition: A | Discharge status: Home / Self Care | Payer: Medicaid Other | Attending: Student | Admitting: Student

## 2015-08-22 DIAGNOSIS — L03012 Cellulitis of left finger: Secondary | ICD-10-CM | POA: Insufficient documentation

## 2015-08-22 DIAGNOSIS — Z79899 Other long term (current) drug therapy: Secondary | ICD-10-CM | POA: Insufficient documentation

## 2015-08-22 DIAGNOSIS — F17210 Nicotine dependence, cigarettes, uncomplicated: Secondary | ICD-10-CM | POA: Insufficient documentation

## 2015-08-22 DIAGNOSIS — Z792 Long term (current) use of antibiotics: Secondary | ICD-10-CM | POA: Insufficient documentation

## 2015-08-22 DIAGNOSIS — R2232 Localized swelling, mass and lump, left upper limb: Secondary | ICD-10-CM | POA: Diagnosis present

## 2015-08-22 MED ORDER — BACITRACIN ZINC 500 UNIT/GM EX OINT
TOPICAL_OINTMENT | CUTANEOUS | Status: DC | PRN
  Administered 2015-08-22: 1 via TOPICAL
  Filled 2015-08-22: qty 0.9

## 2015-08-22 MED ORDER — CEPHALEXIN 500 MG PO CAPS: 500.0000 mg | ORAL_CAPSULE | Freq: Three times a day (TID) | ORAL | Status: DC

## 2015-08-22 MED ORDER — PENTAFLUOROPROP-TETRAFLUOROETH EX AERO
INHALATION_SPRAY | CUTANEOUS | Status: DC | PRN
  Administered 2015-08-22: 30 via TOPICAL
  Filled 2015-08-22: qty 30

## 2015-08-22 NOTE — Discharge Instructions (Signed)
Paronychia °Paronychia is an infection of the skin that surrounds a nail. It usually affects the skin around a fingernail, but it may also occur near a toenail. It often causes pain and swelling around the nail. This condition may come on suddenly or develop over a longer period. In some cases, a collection of pus (abscess) can form near or under the nail. Usually, paronychia is not serious and it clears up with treatment. °CAUSES °This condition may be caused by bacteria or fungi. It is commonly caused by either Streptococcus or Staphylococcus bacteria. The bacteria or fungi often cause the infection by getting into the affected area through an opening in the skin, such as a cut or a hangnail. °RISK FACTORS °This condition is more likely to develop in: °· People who get their hands wet often, such as those who work as dishwashers, bartenders, or nurses. °· People who bite their fingernails or suck their thumbs. °· People who trim their nails too short. °· People who have hangnails or injured fingertips. °· People who get manicures. °· People who have diabetes. °SYMPTOMS °Symptoms of this condition include: °· Redness and swelling of the skin near the nail. °· Tenderness around the nail when you touch the area. °· Pus-filled bumps under the cuticle. The cuticle is the skin at the base or sides of the nail. °· Fluid or pus under the nail. °· Throbbing pain in the area. °DIAGNOSIS °This condition is usually diagnosed with a physical exam. In some cases, a sample of pus may be taken from an abscess to be tested in a lab. This can help to determine what type of bacteria or fungi is causing the condition. °TREATMENT °Treatment for this condition depends on the cause and severity of the condition. If the condition is mild, it may clear up on its own in a few days. Your health care provider may recommend soaking the affected area in warm water a few times a day. When treatment is needed, the options may  include: °· Antibiotic medicine, if the condition is caused by a bacterial infection. °· Antifungal medicine, if the condition is caused by a fungal infection. °· Incision and drainage, if an abscess is present. In this procedure, the health care provider will cut open the abscess so the pus can drain out. °HOME CARE INSTRUCTIONS °· Soak the affected area in warm water if directed to do so by your health care provider. You may be told to do this for 20 minutes, 2-3 times a day. Keep the area dry in between soakings. °· Take medicines only as directed by your health care provider. °· If you were prescribed an antibiotic medicine, finish all of it even if you start to feel better. °· Keep the affected area clean. °· Do not try to drain a fluid-filled bump yourself. °· If you will be washing dishes or performing other tasks that require your hands to get wet, wear rubber gloves. You should also wear gloves if your hands might come in contact with irritating substances, such as cleaners or chemicals. °· Follow your health care provider's instructions about: °¨ Wound care. °¨ Bandage (dressing) changes and removal. °SEEK MEDICAL CARE IF: °· Your symptoms get worse or do not improve with treatment. °· You have a fever or chills. °· You have redness spreading from the affected area. °· You have continued or increased fluid, blood, or pus coming from the affected area. °· Your finger or knuckle becomes swollen or is difficult to move. °  °  This information is not intended to replace advice given to you by your health care provider. Make sure you discuss any questions you have with your health care provider. °  °Document Released: 12/22/2000 Document Revised: 11/12/2014 Document Reviewed: 06/05/2014 °Elsevier Interactive Patient Education ©2016 Elsevier Inc. ° °

## 2015-08-22 NOTE — ED Provider Notes (Signed)
[facility] Emergency Department Provider Note  ____________________________________________  Time seen: Approximately 1:19 PM  I have reviewed the triage vital signs and the nursing notes.   HISTORY  Chief Complaint Abscess    HPI Yvette [name] is a 35 y.o. female , NAD, presents to the emergency department with complaints of continued pain and swelling about left index finger. Has been on Bactrim for 8 days but notes no oozing or weeping about the finger. No specific injury or trauma. No numbness or tingling.    Past Medical History  Diagnosis Date  . Leukopenia   . Neutropenia (HCC)   . Asthma   . History of anemia   . History of blood transfusion 1984  . H/O Kawasaki's disease     as 58 month old child and at 54 years    Patient Active Problem List   Diagnosis Date Noted  . Leukopenia 02/03/2015    No past surgical history on file.  Current Outpatient Rx  Name  Route  Sig  Dispense  Refill  . cephALEXin (KEFLEX) 500 MG capsule   Oral   Take 1 capsule (500 mg total) by mouth 3 (three) times daily.   21 capsule   0   . ibuprofen (ADVIL,MOTRIN) 800 MG tablet   Oral   Take 1 tablet (800 mg total) by mouth every 8 (eight) hours as needed.   30 tablet   0   . PARoxetine (PAXIL) 20 MG tablet   Oral   Take 20 mg by mouth daily. Reported on 07/25/2015         . sulfamethoxazole-trimethoprim (BACTRIM DS,SEPTRA DS) 800-160 MG tablet   Oral   Take 1 tablet by mouth 2 (two) times daily.   20 tablet   0     Allergies Review of patient's allergies indicates no known allergies.  Family History  Problem Relation Age of Onset  . Lupus Mother     Social History Social History  Substance Use Topics  . Smoking status: Current Every Day Smoker -- 1.00 packs/day for 15 years    Types: Cigarettes  . Smokeless tobacco: Never Used     Comment: 3 cigars  . Alcohol Use: 24.0 oz/week    40 Standard drinks or equivalent per week   Comment: daily     Review of Systems  Constitutional: No fever/chills, fatigue.  Cardiovascular: No chest pain. Respiratory: No shortness of breath.  Musculoskeletal: Positive pain left index finger. Negative for back pain.  Skin: Positive redness, swelling left index finger. No oozing or weeping. Negative for rash, skin sores. Neurological: Negative for headaches, focal weakness or numbness. 10-point ROS otherwise negative.  ____________________________________________   PHYSICAL EXAM:  VITAL SIGNS: ED Triage Vitals  Enc Vitals Group     BP --      Pulse --      Resp --      Temp --      Temp src --      SpO2 --      Weight --      Height --      Head Cir --      Peak Flow --      Pain Score --      Pain Loc --      Pain Edu? --      Excl. in GC? --     Constitutional: Alert and oriented. Well appearing and in no acute distress. Eyes: Conjunctivae are normal. PERRL. Head:  Atraumatic. Hematological/Lymphatic/Immunilogical: No cervical lymphadenopathy. Cardiovascular:   Good peripheral circulation. Respiratory: Normal respiratory effort without tachypnea or retractions.  Musculoskeletal: Left index finger with tenderness about skin sore. No joint pain. FROM of left fingers, hand, and wrist.  Neurologic:  Normal speech and language. No gross focal neurologic deficits are appreciated.  Skin:  Distal left index finger inflamed about the nail bed with a green tint to the skin. Area is fluctuant.  Psychiatric: Mood and affect are normal. Speech and behavior are normal. Patient exhibits appropriate insight and judgement.   ____________________________________________   LABS  None  ____________________________________________  EKG  None ____________________________________________  RADIOLOGY  None ____________________________________________    PROCEDURES  Procedure(s) performed: INCISION AND DRAINAGE Performed by: Hope Pigeon Consent: Verbal consent  obtained. Risks and benefits: risks, benefits and alternatives were discussed Type: abscess  Body area: left distal index finger, paronychia  Anesthesia: none  Incision was made with a scalpel.  Local anesthetic: Pain ease spray  Anesthetic total: NA  Complexity: simple  Drainage: purulent  Drainage amount: 1.68mL  Packing material: None  Patient tolerance: Patient tolerated the procedure well with no immediate complications.     Medications  pentafluoroprop-tetrafluoroeth (GEBAUERS) aerosol (30 application Topical Given by Other 08/22/15 1354)     ____________________________________________   INITIAL IMPRESSION / ASSESSMENT AND PLAN / ED COURSE  Patient's diagnosis is consistent with paronychia about left index finger. Patient will be discharged home with prescriptions for Keflex 500 mg take one tablet by mouth 3 times daily for week in conjunction with the remaining Bactrim DS prescription she has.  It has no improvement after incision and drainage today as well as addition of Keflex, she is to return to this emergency department for further evaluation and management.  Patient is given ED precautions to return to the ED for any worsening or new symptoms.    ____________________________________________  FINAL CLINICAL IMPRESSION(S) / ED DIAGNOSES  Final diagnoses:  Paronychia, finger, left      NEW MEDICATIONS STARTED DURING THIS VISIT:  New Prescriptions   CEPHALEXIN (KEFLEX) 500 MG CAPSULE    Take 1 capsule (500 mg total) by mouth 3 (three) times daily.         Hope Pigeon, PA-C 08/22/15 1439  Gayla Doss, MD 08/22/15 [phone]

## 2015-08-22 NOTE — ED Notes (Signed)
Per patient she was seen here on the 2nd for left index finger swelling. Patient states she was put on bactrim for 10 days. Today is her 8th day of antibiotics. Patient states her finger has gotten progressively swollen and puffy. Finger does have a bump noted to the lateral nail bed that is light green in color. Patient states she has 3/10 pain to that finger. Positive pulses and sensations bilaterally.

## 2015-12-15 ENCOUNTER — Ambulatory Visit: Payer: Medicaid Other

## 2016-01-19 ENCOUNTER — Ambulatory Visit: Payer: Medicaid Other

## 2016-01-19 ENCOUNTER — Ambulatory Visit
Admission: RE | Admit: 2016-01-19 | Discharge: 2016-01-19 | Disposition: A | Discharge status: Home / Self Care | Payer: Medicaid Other | Source: Ambulatory Visit | Attending: Internal Medicine | Admitting: Internal Medicine

## 2016-01-19 DIAGNOSIS — R918 Other nonspecific abnormal finding of lung field: Secondary | ICD-10-CM

## 2016-01-19 DIAGNOSIS — K449 Diaphragmatic hernia without obstruction or gangrene: Secondary | ICD-10-CM | POA: Diagnosis not present

## 2016-01-19 DIAGNOSIS — D72819 Decreased white blood cell count, unspecified: Secondary | ICD-10-CM | POA: Diagnosis present

## 2016-01-19 MED ORDER — IOPAMIDOL (ISOVUE-300) INJECTION 61%
75.0000 mL | Freq: Once | INTRAVENOUS | Status: AC | PRN
  Administered 2016-01-19: 75 mL via INTRAVENOUS

## 2016-01-23 ENCOUNTER — Other Ambulatory Visit: Payer: Medicaid Other

## 2016-01-23 ENCOUNTER — Inpatient Hospital Stay: Payer: Medicaid Other | Admitting: Internal Medicine

## 2016-01-23 ENCOUNTER — Ambulatory Visit: Payer: Medicaid Other | Admitting: Hematology and Oncology

## 2016-01-23 ENCOUNTER — Inpatient Hospital Stay: Payer: Medicaid Other

## 2016-02-18 ENCOUNTER — Inpatient Hospital Stay: Payer: Medicaid Other

## 2016-02-18 ENCOUNTER — Inpatient Hospital Stay: Payer: Medicaid Other | Admitting: Internal Medicine

## 2016-03-03 ENCOUNTER — Encounter (INDEPENDENT_AMBULATORY_CARE_PROVIDER_SITE_OTHER): Payer: Self-pay

## 2016-03-03 ENCOUNTER — Inpatient Hospital Stay: Payer: Medicaid Other

## 2016-03-03 ENCOUNTER — Inpatient Hospital Stay: Payer: Medicaid Other | Attending: Internal Medicine | Admitting: Internal Medicine

## 2016-03-03 ENCOUNTER — Encounter: Payer: Self-pay | Admitting: Internal Medicine

## 2016-03-03 VITALS — BP 123/85 | HR 65 | Temp 96.9°F | Resp 16 | Ht 62.0 in | Wt 94.2 lb

## 2016-03-03 DIAGNOSIS — J45909 Unspecified asthma, uncomplicated: Secondary | ICD-10-CM | POA: Diagnosis not present

## 2016-03-03 DIAGNOSIS — D72819 Decreased white blood cell count, unspecified: Secondary | ICD-10-CM

## 2016-03-03 DIAGNOSIS — F17210 Nicotine dependence, cigarettes, uncomplicated: Secondary | ICD-10-CM | POA: Diagnosis not present

## 2016-03-03 DIAGNOSIS — R918 Other nonspecific abnormal finding of lung field: Secondary | ICD-10-CM | POA: Diagnosis not present

## 2016-03-03 DIAGNOSIS — F1010 Alcohol abuse, uncomplicated: Secondary | ICD-10-CM | POA: Insufficient documentation

## 2016-03-03 DIAGNOSIS — Z79899 Other long term (current) drug therapy: Secondary | ICD-10-CM | POA: Diagnosis not present

## 2016-03-03 DIAGNOSIS — M303 Mucocutaneous lymph node syndrome [Kawasaki]: Secondary | ICD-10-CM | POA: Diagnosis not present

## 2016-03-03 LAB — CBC WITH DIFFERENTIAL/PLATELET
BASOS ABS: 0 10*3/uL (ref 0–0.1)
EOS ABS: 0.1 10*3/uL (ref 0–0.7)
HCT: 36.5 % (ref 35.0–47.0)
HEMOGLOBIN: 12.5 g/dL (ref 12.0–16.0)
LYMPHS ABS: 0.6 10*3/uL — AB (ref 1.0–3.6)
Lymphocytes Relative: 48 %
MCH: 30.3 pg (ref 26.0–34.0)
MCHC: 34.3 g/dL (ref 32.0–36.0)
MCV: 88.5 fL (ref 80.0–100.0)
Monocytes Absolute: 0.3 10*3/uL (ref 0.2–0.9)
Monocytes Relative: 20 %
Neutro Abs: 0.4 10*3/uL — ABNORMAL LOW (ref 1.4–6.5)
PLATELETS: 146 10*3/uL — AB (ref 150–440)
RBC: 4.12 MIL/uL (ref 3.80–5.20)
RDW: 14.5 % (ref 11.5–14.5)
WBC: 1.3 10*3/uL — AB (ref 3.6–11.0)

## 2016-03-03 LAB — COMPREHENSIVE METABOLIC PANEL
ALBUMIN: 4.1 g/dL (ref 3.5–5.0)
ALK PHOS: 81 U/L (ref 38–126)
ALT: 73 U/L — AB (ref 14–54)
AST: 279 U/L — AB (ref 15–41)
Anion gap: 9 (ref 5–15)
BUN: 10 mg/dL (ref 6–20)
CALCIUM: 8.4 mg/dL — AB (ref 8.9–10.3)
CHLORIDE: 104 mmol/L (ref 101–111)
CO2: 22 mmol/L (ref 22–32)
CREATININE: 0.57 mg/dL (ref 0.44–1.00)
GFR calc Af Amer: >60 mL/min (ref >60)
GFR calc non Af Amer: >60 mL/min (ref >60)
GLUCOSE: 77 mg/dL (ref 65–99)
Potassium: 4.1 mmol/L (ref 3.5–5.1)
SODIUM: 135 mmol/L (ref 135–145)
Total Bilirubin: 0.6 mg/dL (ref 0.3–1.2)
Total Protein: 7.3 g/dL (ref 6.5–8.1)

## 2016-03-03 NOTE — Progress Notes (Signed)
Critical ANC today. Called by [facility], in lab @ [phone] 03/03/2016 Anc: 0.4.  Read  Back process performed with Selena Batten.  Read back process performed--with Dr. Donneta Romberg 1120am

## 2016-03-03 NOTE — Progress Notes (Signed)
Here for CT scan results.  

## 2016-03-03 NOTE — Progress Notes (Signed)
[name] OFFICE PROGRESS NOTE  Patient Care Team: Yvette Can, MD as PCP - General (Internal Medicine)   SUMMARY OF ONCOLOGIC HISTORY:  # NOV 2015 WBC- 2.2/ANC- 1200/ALC-500; [name]; 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   # Hx of Kawasaki disease [age 28M & 13y]  # Alcohol abuse/Smoking  INTERVAL HISTORY:  A pleasant 35 year-old African-American female patient with above history of chronic mild leukopenia/neutropenia is here for follow-up.    Unfortunately patient continues to drink alcohol; and also smokes.  No weight loss. No night sweats. Denies any recent infections. He has not been in the hospital.   REVIEW OF SYSTEMS:  A complete 10 point review of system is done which is negative except mentioned above/history of present illness.   PAST MEDICAL HISTORY :  Past Medical History:  Diagnosis Date  . Asthma   . H/O Kawasaki's disease    as 40 month old child and at 31 years  . History of anemia   . History of blood transfusion 1984  . Leukopenia   . Neutropenia ([place])     PAST SURGICAL HISTORY :  No past surgical history on file.  FAMILY HISTORY :   Family History  Problem Relation Age of Onset  . Lupus Mother   . Cancer Paternal Aunt     Breast   . Cancer Paternal Aunt     Breast     SOCIAL HISTORY:   Social History  Substance Use Topics  . Smoking status: Current Every Day Smoker    Packs/day: 1.00    Years: 15.00    Types: Cigarettes  . Smokeless tobacco: Never Used     Comment: 3 cigars  . Alcohol use 24.0 oz/week    40 Standard drinks or equivalent per week     Comment: daily    ALLERGIES:  has No Known Allergies.  MEDICATIONS:  Current Outpatient Prescriptions  Medication Sig Dispense Refill  . ibuprofen (ADVIL,MOTRIN) 800 MG tablet Take 1 tablet (800 mg total) by mouth every 8 (eight) hours as  needed. 30 tablet 0  . PARoxetine (PAXIL) 20 MG tablet Take 20 mg by mouth daily. Reported on 07/25/2015     No current facility-administered medications for this visit.     PHYSICAL EXAMINATION: ECOG PERFORMANCE STATUS: 0 - Asymptomatic  BP 123/85 (BP Location: Right Arm, Patient Position: Sitting)   Pulse 65   Temp (!) 96.9 F (36.1 C) (Tympanic)   Resp 16   Ht '5\' 2"'$  (1.575 m)   Wt 94 lb 3.2 oz (42.7 kg)   SpO2 99%   BMI 17.23 kg/m   Filed Weights   03/03/16 1145  Weight: 94 lb 3.2 oz (42.7 kg)    GENERAL: Thin built ;well-developed; Alert, no distress and comfortable.  Alone; nervous. She is alone. EYES: no pallor or icterus OROPHARYNX: no thrush or ulceration; good dentition  NECK: supple, no masses felt LYMPH:  no palpable lymphadenopathy in the cervical, axillary or inguinal regions LUNGS: clear to auscultation and  No wheeze or crackles HEART/CVS: regular rate & rhythm and no murmurs; No lower extremity edema ABDOMEN:abdomen soft, non-tender and normal bowel sounds Musculoskeletal:no cyanosis of digits and no clubbing  PSYCH: alert & oriented x 3 with fluent speech NEURO: no focal motor/sensory deficits SKIN:  no rashes or significant lesions  LABORATORY DATA:  I have reviewed the data  as listed    Component Value Date/Time   NA 135 03/03/2016 1102   K 4.1 03/03/2016 1102   CL 104 03/03/2016 1102   CO2 22 03/03/2016 1102   GLUCOSE 77 03/03/2016 1102   BUN 10 03/03/2016 1102   CREATININE 0.57 03/03/2016 1102   CALCIUM 8.4 (L) 03/03/2016 1102   PROT 7.3 03/03/2016 1102   ALBUMIN 4.1 03/03/2016 1102   AST 279 (H) 03/03/2016 1102   ALT 73 (H) 03/03/2016 1102   ALKPHOS 81 03/03/2016 1102   BILITOT 0.6 03/03/2016 1102   GFRNONAA >60 03/03/2016 1102   GFRAA >60 03/03/2016 1102    No results found for: SPEP, UPEP  Lab Results  Component Value Date   WBC 1.3 (LL) 03/03/2016   NEUTROABS 0.4 (L) 03/03/2016   HGB 12.5 03/03/2016   HCT 36.5 03/03/2016    MCV 88.5 03/03/2016   PLT 146 (L) 03/03/2016      Chemistry      Component Value Date/Time   NA 135 03/03/2016 1102   K 4.1 03/03/2016 1102   CL 104 03/03/2016 1102   CO2 22 03/03/2016 1102   BUN 10 03/03/2016 1102   CREATININE 0.57 03/03/2016 1102      Component Value Date/Time   CALCIUM 8.4 (L) 03/03/2016 1102   ALKPHOS 81 03/03/2016 1102   AST 279 (H) 03/03/2016 1102   ALT 73 (H) 03/03/2016 1102   BILITOT 0.6 03/03/2016 1102       RADIOGRAPHIC STUDIES: I have personally reviewed the radiological images as listed and agreed with the findings in the report. No results found.   ASSESSMENT & PLAN:  Leukopenia # Today white count 1.3 ANC 400- asymptomatic/hemoglobin 12.5 platelets 147. Question alcohol versus other causes. Recommend bone marrow biopsy discussed the procedure pros and cons. Want to have and anesthesia; question transportation issues to [place].  # Patient will follow-up with me in approximately 3-4 weeks if she is having a bone marrow biopsy; otherwise follow-up with me in 6 months/CBC CMP.  # Heavy alcohol- not interested in cessation.  # Lung nodules- CT chest July 2017 improving/likely inflammatory.       Cammie Sickle, MD 03/03/2016 12:46 PM

## 2016-03-03 NOTE — Assessment & Plan Note (Addendum)
#  Today white count 1.3 ANC 400- asymptomatic/hemoglobin 12.5 platelets 147. Question alcohol versus other causes. Recommend bone marrow biopsy discussed the procedure pros and cons. Want to have and anesthesia; question transportation issues to [place].  # Patient will follow-up with me in approximately 3-4 weeks if she is having a bone marrow biopsy; otherwise follow-up with me in 6 months/CBC CMP.  # AST/ALT- elevated- likely sec to alcohol. Asymptomatic.   # Heavy alcohol- not interested in cessation.  # Lung nodules- CT chest July 2017 improving/likely inflammatory.

## 2016-03-08 ENCOUNTER — Other Ambulatory Visit: Payer: Medicaid Other

## 2016-03-08 ENCOUNTER — Ambulatory Visit: Payer: Medicaid Other | Admitting: Internal Medicine

## 2016-03-09 ENCOUNTER — Other Ambulatory Visit: Payer: Self-pay | Admitting: Radiology

## 2016-03-10 ENCOUNTER — Other Ambulatory Visit: Payer: Self-pay | Admitting: General Surgery

## 2016-03-10 ENCOUNTER — Other Ambulatory Visit: Payer: Self-pay | Admitting: Radiology

## 2016-03-12 ENCOUNTER — Ambulatory Visit (HOSPITAL_COMMUNITY): Admission: RE | Admit: 2016-03-12 | Payer: Medicaid Other | Source: Ambulatory Visit

## 2016-05-20 ENCOUNTER — Telehealth: Payer: Self-pay | Admitting: *Deleted

## 2016-05-20 NOTE — Telephone Encounter (Signed)
Pt called. Left msg for scheduling team in cancer center. Pt asking for ct guided bone marrow biospy - pt now agreeable to biopsy.  MD does not need to see patient now -

## 2016-05-25 ENCOUNTER — Other Ambulatory Visit: Payer: Self-pay | Admitting: Radiology

## 2016-05-26 ENCOUNTER — Encounter: Payer: Self-pay | Admitting: Oncology

## 2016-05-26 ENCOUNTER — Ambulatory Visit
Admission: RE | Admit: 2016-05-26 | Discharge: 2016-05-26 | Disposition: A | Discharge status: Home / Self Care | Payer: Medicaid Other | Source: Ambulatory Visit | Attending: Internal Medicine | Admitting: Internal Medicine

## 2016-05-26 DIAGNOSIS — J45909 Unspecified asthma, uncomplicated: Secondary | ICD-10-CM | POA: Diagnosis not present

## 2016-05-26 DIAGNOSIS — Z803 Family history of malignant neoplasm of breast: Secondary | ICD-10-CM | POA: Insufficient documentation

## 2016-05-26 DIAGNOSIS — D696 Thrombocytopenia, unspecified: Secondary | ICD-10-CM | POA: Insufficient documentation

## 2016-05-26 DIAGNOSIS — Z8489 Family history of other specified conditions: Secondary | ICD-10-CM | POA: Insufficient documentation

## 2016-05-26 DIAGNOSIS — Z79899 Other long term (current) drug therapy: Secondary | ICD-10-CM | POA: Diagnosis not present

## 2016-05-26 DIAGNOSIS — F17210 Nicotine dependence, cigarettes, uncomplicated: Secondary | ICD-10-CM | POA: Insufficient documentation

## 2016-05-26 DIAGNOSIS — D72819 Decreased white blood cell count, unspecified: Secondary | ICD-10-CM | POA: Diagnosis not present

## 2016-05-26 DIAGNOSIS — M303 Mucocutaneous lymph node syndrome [Kawasaki]: Secondary | ICD-10-CM | POA: Insufficient documentation

## 2016-05-26 LAB — CBC WITH DIFFERENTIAL/PLATELET
BASOS PCT: 1 %
Basophils Absolute: 0 10*3/uL (ref 0–0.1)
EOS ABS: 0.1 10*3/uL (ref 0–0.7)
EOS PCT: 2 %
HCT: 40.8 % (ref 35.0–47.0)
HEMOGLOBIN: 13.8 g/dL (ref 12.0–16.0)
Lymphocytes Relative: 28 %
Lymphs Abs: 0.8 10*3/uL — ABNORMAL LOW (ref 1.0–3.6)
MCH: 29.9 pg (ref 26.0–34.0)
MCHC: 33.9 g/dL (ref 32.0–36.0)
MCV: 88.3 fL (ref 80.0–100.0)
MONOS PCT: 15 %
Monocytes Absolute: 0.4 10*3/uL (ref 0.2–0.9)
NEUTROS PCT: 54 %
Neutro Abs: 1.5 10*3/uL (ref 1.4–6.5)
PLATELETS: 132 10*3/uL — AB (ref 150–440)
RBC: 4.62 MIL/uL (ref 3.80–5.20)
RDW: 14.8 % — ABNORMAL HIGH (ref 11.5–14.5)
WBC: 2.8 10*3/uL — AB (ref 3.6–11.0)

## 2016-05-26 LAB — PROTIME-INR
INR: 1
PROTHROMBIN TIME: 13.2 s (ref 11.4–15.2)

## 2016-05-26 LAB — PREGNANCY, URINE: Preg Test, Ur: NEGATIVE

## 2016-05-26 MED ORDER — MIDAZOLAM HCL 2 MG/2ML IJ SOLN
INTRAMUSCULAR | Status: AC | PRN
Start: 2016-05-26 — End: 2016-05-26
  Administered 2016-05-26 (×2): 1 mg via INTRAVENOUS

## 2016-05-26 MED ORDER — SODIUM CHLORIDE 0.9 % IV SOLN
INTRAVENOUS | Status: DC
  Administered 2016-05-26: 10:00:00 via INTRAVENOUS

## 2016-05-26 MED ORDER — MIDAZOLAM HCL 2 MG/2ML IJ SOLN
INTRAMUSCULAR | Status: DC | PRN
  Administered 2016-05-26: 1 mg via INTRAVENOUS

## 2016-05-26 MED ORDER — FENTANYL CITRATE (PF) 100 MCG/2ML IJ SOLN
INTRAMUSCULAR | Status: AC | PRN
  Administered 2016-05-26 (×2): 50 ug via INTRAVENOUS

## 2016-05-26 NOTE — H&P (Signed)
Chief Complaint: Patient was seen in consultation today for bone marrow biopsy at the request of Brahmanday,Govinda R  Referring Physician(s): Brahmanday,Govinda R  Patient Status: [facility] - Out-pt  History of Present Illness: Yvette [name] is a 35 y.o. female with leukopenia here for CT guided bone marrow biopsy.  Past Medical History:  Diagnosis Date  . Asthma   . 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 History of anemia   . History of blood transfusion 1984  . Leukopenia   . Neutropenia ([place])     History reviewed. No pertinent surgical history.  Allergies: Patient has no known allergies.  Medications: Prior to Admission medications   Medication Sig Start Date End Date Taking? Authorizing Provider  ibuprofen (ADVIL,MOTRIN) 800 MG tablet Take 1 tablet (800 mg total) by mouth every 8 (eight) hours as needed. Patient not taking: Reported on 05/26/2016 08/14/15   Sable Feil, PA-C  PARoxetine (PAXIL) 20 MG tablet Take 20 mg by mouth daily. Reported on 07/25/2015    Historical Provider, MD     Family History  Problem Relation Age of Onset  . Lupus Mother   . Cancer Paternal Aunt     Breast   . Cancer Paternal Aunt     Breast     Social History   Social History  . Marital status: Single    Spouse name: N/A  . Number of children: N/A  . Years of education: N/A   Social History Main Topics  . Smoking status: Current Every Day Smoker    Packs/day: 0.20    Years: 15.00    Types: Cigarettes  . Smokeless tobacco: Never Used  . Alcohol use 36.6 oz/week    40 Standard drinks or equivalent, 21 Cans of beer per week     Comment: daily  . Drug use: No  . Sexual activity: Yes    Partners: Male   Other Topics Concern  . None   Social History Narrative  . None    Review of Systems: A 12 point ROS discussed and pertinent positives are indicated in the HPI above.  All other systems are negative.  Review of Systems  Constitutional:  Negative.   HENT: Negative.   Respiratory: Negative.   Cardiovascular: Negative.   Gastrointestinal: Negative.   Genitourinary: Negative.   Musculoskeletal: Negative.   Neurological: Negative.     Vital Signs: BP 136/79   Temp 98.6 F (37 C) (Oral)   Wt 94 lb (42.6 kg)   SpO2 100%   BMI 17.19 kg/m   Physical Exam  Constitutional: She is oriented to person, place, and time. No distress.  HENT:  Head: Normocephalic and atraumatic.  Mouth/Throat: Oropharynx is clear and moist.  Neck: Neck supple. No JVD present. No tracheal deviation present. No thyromegaly present.  Cardiovascular: Normal rate, regular rhythm and normal heart sounds.  Exam reveals no gallop and no friction rub.   No murmur heard. Pulmonary/Chest: Effort normal and breath sounds normal. No stridor. No respiratory distress. She has no wheezes. She has no rales.  Abdominal: Soft. Bowel sounds are normal. She exhibits no distension and no mass. There is no tenderness. There is no rebound and no guarding.  Musculoskeletal: She exhibits no edema.  Lymphadenopathy:    She has no cervical adenopathy.  Neurological: She is alert and oriented to person, place, and time.  Skin: She is not diaphoretic.  Vitals reviewed.   Mallampati  Score:  MD Evaluation Airway: WNL Heart: WNL Abdomen: WNL Chest/ Lungs: WNL ASA  Classification: 2 Mallampati/Airway Score: One  Imaging: No results found.  Labs:  CBC:  Recent Labs  06/20/15 0950 07/25/15 1104 03/03/16 1102 05/26/16 0855  WBC 3.2* 2.0* 1.3* 2.8*  HGB 13.9 13.1 12.5 13.8  HCT 41.6 39.0 36.5 40.8  PLT 185 197 146* 132*    COAGS:  Recent Labs  05/26/16 0855  INR 1.00    BMP:  Recent Labs  03/03/16 1102  NA 135  K 4.1  CL 104  CO2 22  GLUCOSE 77  BUN 10  CALCIUM 8.4*  CREATININE 0.57  GFRNONAA >60  GFRAA >60    LIVER FUNCTION TESTS:  Recent Labs  03/03/16 1102  BILITOT 0.6  AST 279*  ALT 73*  ALKPHOS 81  PROT 7.3  ALBUMIN  4.1     Assessment and Plan:  Bone marrow biopsy today under CT guidance.  Consent obtained.  Risks and benefits discussed with the patient including, but not limited to bleeding, infection, damage to adjacent structures or low yield requiring additional tests. All of the patient's questions were answered, patient is agreeable to proceed. Consent signed and in chart.  Thank you for this interesting consult.  I greatly enjoyed meeting [name] and look forward to participating in their care.  A copy of this report was sent to the requesting provider on this date.  Electronically SignedAletta Edouard T 05/26/2016, 9:52 AM   I spent a total of 30 Minutes in face to face in clinical consultation, greater than 50% of which was counseling/coordinating care for bone marrow biopsy.

## 2016-05-26 NOTE — Discharge Instructions (Signed)
Bone Marrow Aspiration and Bone Marrow Biopsy, Adult Bone marrow aspiration and bone marrow biopsy are procedures that are done to diagnose blood disorders. You may also have one of these procedures to help diagnose infections or some types of cancer. Bone marrow is the soft tissue that is inside your bones. Blood cells are produced in bone marrow. For bone marrow aspiration, a sample of tissue in liquid form is removed from inside your bone. For a bone marrow biopsy, a small core of bone marrow tissue is removed. These samples are examined under a microscope or tested in a lab. You may need these procedures if you have an abnormal complete blood count (CBC). The aspiration or biopsy sample is usually taken from the top of your hip bone. Sometimes, an aspiration sample is taken from your chest bone (sternum). Tell a health care provider about:  Any allergies you have.  All medicines you are taking, including vitamins, herbs, eye drops, creams, and over-the-counter medicines.  Any problems you or family members have had with anesthetic medicines.  Any blood or bone disorders you have.  Any surgeries you have had.  Any medical conditions you have.  Whether you are pregnant or you think that you may be pregnant. What are the risks? Generally, this is a safe procedure. However, problems may occur, including:  Infection.  Bleeding.  Persistent pain after the procedure.  Cracking (fracture) of the bone.  Allergic reactions to medicines. What happens before the procedure? Staying hydrated  Follow instructions from your health care provider about hydration, which may include:  Up to 2 hours before the procedure - you may continue to drink clear liquids, such as water, clear fruit juice, black coffee, and plain tea. Eating and drinking restrictions  Follow instructions from your health care provider about eating and drinking, which may include:  8 hours before the procedure - stop  eating heavy meals or foods such as meat, fried foods, or fatty foods.  6 hours before the procedure - stop eating light meals or foods, such as toast or cereal.  6 hours before the procedure - stop drinking milk or drinks that contain milk.  2 hours before the procedure - stop drinking clear liquids. Medicines  Ask your health care provider about:  Changing or stopping your regular medicines. This is especially important if you are taking diabetes medicines or blood thinners.  Taking medicines such as aspirin and ibuprofen. These medicines can thin your blood. Do not take these medicines before your procedure if your health care provider instructs you not to.  You may be given antibiotic medicine to help prevent infection. General instructions  Plan to have someone take you home after the procedure.  If you will be going home right after the procedure, plan to have someone with you for 24 hours.  Ask your health care provider how your surgical site will be marked or identified. What happens during the procedure?  To reduce your risk of infection:  Your health care team will wash or sanitize their hands.  Your skin will be washed with soap.  Hair may be removed from the surgical area.  An IV tube may be inserted into one of your veins.  The injection site will be cleaned with a germ-killing solution (antiseptic).  You will be given one or more of the following:  A medicine to help you relax (sedative).  A medicine to numb the area (local anesthetic).  A medicine to make you fall asleep (general  asleep (general anesthetic). °· The bone marrow sample will be removed as follows: °¨ For an aspiration, a hollow needle will be inserted through your skin and into your bone. Bone marrow fluid will be drawn up into a syringe. °¨ For a biopsy, your health care provider will use a hollow needle to remove a core of tissue from your bone marrow. °· The needle will be removed. °· A bandage (dressing)  will be placed over the insertion site and taped in place. °The procedure may vary among health care providers and hospitals. °What happens after the procedure? °· Your blood pressure, heart rate, breathing rate, and blood oxygen level will be monitored until the medicines you were given have worn off. °· Your IV tube will be removed, and the insertion site will be checked for bleeding. °· Do not drive for 24 hours if you were given a sedative. °This information is not intended to replace advice given to you by your health care provider. Make sure you discuss any questions you have with your health care provider. °Document Released: 07/01/2004 Document Revised: 01/16/2016 Document Reviewed: 12/10/2015 °Elsevier Interactive Patient Education © 2017 [employer] ° °

## 2016-05-26 NOTE — Procedures (Signed)
Interventional Radiology Procedure Note  Procedure: CT guided aspirate and core biopsy of right iliac bone Complications: None Recommendations: - Bedrest supine x 1 hrs - Follow biopsy results  Romelle Reiley T. Karmelo Bass, M.D Pager:  [phone]   

## 2016-06-11 ENCOUNTER — Inpatient Hospital Stay: Payer: Medicaid Other

## 2016-06-11 ENCOUNTER — Inpatient Hospital Stay: Payer: Medicaid Other | Attending: Internal Medicine | Admitting: Internal Medicine

## 2016-06-11 DIAGNOSIS — F1010 Alcohol abuse, uncomplicated: Secondary | ICD-10-CM

## 2016-06-11 DIAGNOSIS — J45909 Unspecified asthma, uncomplicated: Secondary | ICD-10-CM | POA: Diagnosis not present

## 2016-06-11 DIAGNOSIS — D708 Other neutropenia: Secondary | ICD-10-CM | POA: Insufficient documentation

## 2016-06-11 DIAGNOSIS — F17210 Nicotine dependence, cigarettes, uncomplicated: Secondary | ICD-10-CM | POA: Insufficient documentation

## 2016-06-11 DIAGNOSIS — Z79899 Other long term (current) drug therapy: Secondary | ICD-10-CM | POA: Diagnosis not present

## 2016-06-11 DIAGNOSIS — D709 Neutropenia, unspecified: Secondary | ICD-10-CM | POA: Insufficient documentation

## 2016-06-11 DIAGNOSIS — D72819 Decreased white blood cell count, unspecified: Secondary | ICD-10-CM

## 2016-06-11 DIAGNOSIS — M303 Mucocutaneous lymph node syndrome [Kawasaki]: Secondary | ICD-10-CM | POA: Diagnosis not present

## 2016-06-11 LAB — CBC WITH DIFFERENTIAL/PLATELET
BASOS ABS: 0 10*3/uL (ref 0–0.1)
Basophils Relative: 1 %
EOS ABS: 0.1 10*3/uL (ref 0–0.7)
EOS PCT: 3 %
HCT: 39.3 % (ref 35.0–47.0)
Hemoglobin: 13.3 g/dL (ref 12.0–16.0)
Lymphocytes Relative: 33 %
Lymphs Abs: 1 10*3/uL (ref 1.0–3.6)
MCH: 30 pg (ref 26.0–34.0)
MCHC: 33.8 g/dL (ref 32.0–36.0)
MCV: 88.8 fL (ref 80.0–100.0)
Monocytes Absolute: 0.5 10*3/uL (ref 0.2–0.9)
Monocytes Relative: 17 %
Neutro Abs: 1.3 10*3/uL — ABNORMAL LOW (ref 1.4–6.5)
Neutrophils Relative %: 46 %
PLATELETS: 156 10*3/uL (ref 150–440)
RBC: 4.42 MIL/uL (ref 3.80–5.20)
RDW: 14.2 % (ref 11.5–14.5)
WBC: 2.9 10*3/uL — AB (ref 3.6–11.0)

## 2016-06-11 NOTE — Progress Notes (Signed)
Pt here for follow up. Does c/o intermittent s.o.b. On inspiration-left side she stated.

## 2016-06-11 NOTE — Assessment & Plan Note (Signed)
#  Intermittent neutropenia white count  2-3 ANC [phone]; asymptomatic/hemoglobin 12.5 platelets 147. Likely alcohol versus other causes- likely benign ethnic neutropenia. Bone marrow biopsy- Negative for any malignancy. She was given a copy of her bone marrow biopsy.  # Heavy alcohol- again discussed cessation. not interested in cessation.  # Patient follow-up with me in approximately 12 months CBC CMP prior.

## 2016-06-11 NOTE — Progress Notes (Signed)
[facility] OFFICE PROGRESS NOTE  Patient Care Team: Colleen Can, MD as PCP - General (Internal Medicine)   SUMMARY OF ONCOLOGIC HISTORY:  # NOV 2015 WBC- 2.2/ANC- 1200/ALC-500; [name]; 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 Normal cytogenetics  # OCT 2016- Bil indeterminate lung nodules-CT  In July 2017- Improving.   # Hx of Kawasaki disease [age 56M & 13y]  # Alcohol abuse/Smoking  INTERVAL HISTORY:  A pleasant 35 year-old African-American female patient with above history of chronic mild leukopenia/neutropenia is here for follow-up/ review the results of her bone marrow biopsy.   Unfortunately patient continues to drink alcohol; and also smokes.  No weight loss. No night sweats. Denies any recent infections.   REVIEW OF SYSTEMS:  A complete 10 point review of system is done which is negative except mentioned above/history of present illness.   PAST MEDICAL HISTORY :  Past Medical History:  Diagnosis Date  . Asthma   . H/O Kawasaki's disease    as 55 month old child and at 84 years  . History of anemia   . History of blood transfusion 1984  . Leukopenia   . Neutropenia ([place])     PAST SURGICAL HISTORY :  No past surgical history on file.  FAMILY HISTORY :   Family History  Problem Relation Age of Onset  . Lupus Mother   . Cancer Paternal Aunt     Breast   . Cancer Paternal Aunt     Breast     SOCIAL HISTORY:   Social History  Substance Use Topics  . Smoking status: Current Every Day Smoker    Packs/day: 0.20    Years: 15.00    Types: Cigarettes  . Smokeless tobacco: Never Used  . Alcohol use 36.6 oz/week    40 Standard drinks or equivalent, 21 Cans of beer per week     Comment: daily    ALLERGIES:  has No Known Allergies.  MEDICATIONS:  Current Outpatient Prescriptions  Medication Sig Dispense Refill  .  diphenhydrAMINE (BENADRYL) 25 MG tablet Take 25 mg by mouth at bedtime as needed for sleep.    Marland Kitchen PARoxetine (PAXIL) 20 MG tablet Take 20 mg by mouth daily. Reported on 07/25/2015    . sertraline (ZOLOFT) 25 MG tablet Take 25 mg by mouth daily.     No current facility-administered medications for this visit.     PHYSICAL EXAMINATION: ECOG PERFORMANCE STATUS: 0 - Asymptomatic  BP 106/80 (BP Location: Right Arm, Patient Position: Sitting)   Pulse 88   Temp 97.1 F (36.2 C) (Tympanic)   Resp 18   Wt 92 lb 9.6 oz (42 kg)   BMI 16.94 kg/m   Filed Weights   06/11/16 1052  Weight: 92 lb 9.6 oz (42 kg)    GENERAL: Thin built ;well-developed; Alert, no distress and comfortable.  Alone; nervous. She is alone. EYES: no pallor or icterus OROPHARYNX: no thrush or ulceration; good dentition  NECK: supple, no masses felt LYMPH:  no palpable lymphadenopathy in the cervical, axillary or inguinal regions LUNGS: clear to auscultation and  No wheeze or crackles HEART/CVS: regular rate & rhythm and no murmurs; No lower extremity edema ABDOMEN:abdomen soft, non-tender and normal bowel sounds Musculoskeletal:no cyanosis of digits and no clubbing  PSYCH: alert & oriented x 3 with fluent speech NEURO: no focal motor/sensory deficits SKIN:  no rashes or significant lesions  LABORATORY DATA:  I have reviewed the data as listed    Component Value Date/Time   NA 135 03/03/2016 1102   K 4.1 03/03/2016 1102   CL 104 03/03/2016 1102   CO2 22 03/03/2016 1102   GLUCOSE 77 03/03/2016 1102   BUN 10 03/03/2016 1102   CREATININE 0.57 03/03/2016 1102   CALCIUM 8.4 (L) 03/03/2016 1102   PROT 7.3 03/03/2016 1102   ALBUMIN 4.1 03/03/2016 1102   AST 279 (H) 03/03/2016 1102   ALT 73 (H) 03/03/2016 1102   ALKPHOS 81 03/03/2016 1102   BILITOT 0.6 03/03/2016 1102   GFRNONAA >60 03/03/2016 1102   GFRAA >60 03/03/2016 1102    No results found for: SPEP, UPEP  Lab Results  Component Value Date   WBC 2.9  (L) 06/11/2016   NEUTROABS 1.3 (L) 06/11/2016   HGB 13.3 06/11/2016   HCT 39.3 06/11/2016   MCV 88.8 06/11/2016   PLT 156 06/11/2016      Chemistry      Component Value Date/Time   NA 135 03/03/2016 1102   K 4.1 03/03/2016 1102   CL 104 03/03/2016 1102   CO2 22 03/03/2016 1102   BUN 10 03/03/2016 1102   CREATININE 0.57 03/03/2016 1102      Component Value Date/Time   CALCIUM 8.4 (L) 03/03/2016 1102   ALKPHOS 81 03/03/2016 1102   AST 279 (H) 03/03/2016 1102   ALT 73 (H) 03/03/2016 1102   BILITOT 0.6 03/03/2016 1102       RADIOGRAPHIC STUDIES: I have personally reviewed the radiological images as listed and agreed with the findings in the report. No results found.   ASSESSMENT & PLAN:  Other neutropenia ([place]) # Intermittent neutropenia white count  2-3 ANC [phone]; asymptomatic/hemoglobin 12.5 platelets 147. Likely alcohol versus other causes- likely benign ethnic neutropenia. Bone marrow biopsy- Negative for any malignancy. She was given a copy of her bone marrow biopsy.  # Heavy alcohol- again discussed cessation. not interested in cessation.  # Patient follow-up with me in approximately 12 months CBC CMP prior.       Cammie Sickle, MD 06/12/2016 11:47 AM

## 2016-06-23 ENCOUNTER — Other Ambulatory Visit: Payer: Medicaid Other

## 2016-06-23 ENCOUNTER — Ambulatory Visit: Payer: Medicaid Other | Admitting: Internal Medicine

## 2016-12-15 ENCOUNTER — Ambulatory Visit: Payer: Medicaid Other

## 2016-12-28 ENCOUNTER — Ambulatory Visit: Payer: Medicaid Other | Attending: Family Medicine

## 2016-12-28 DIAGNOSIS — G8929 Other chronic pain: Secondary | ICD-10-CM | POA: Insufficient documentation

## 2016-12-28 DIAGNOSIS — M545 Low back pain: Secondary | ICD-10-CM | POA: Diagnosis not present

## 2016-12-28 DIAGNOSIS — M6281 Muscle weakness (generalized): Secondary | ICD-10-CM

## 2016-12-28 NOTE — Therapy (Signed)
[place] [facility]ICINE [address] [place], [address] Phone: [phone]   Fax:  [phone]  Physical Therapy Evaluation  Patient Details  Name: Yvette [name] MRN: [id number] Date of Birth: [birth year] Referring Provider: Lanier Ensign MD  Encounter Date: 12/28/2016      PT End of Session - 12/28/16 1340    Visit Number 1   Number of Visits 12   Date for PT Re-Evaluation 02/08/17   PT Start Time 1030   PT Stop Time 1125   PT Time Calculation (min) 55 min   Activity Tolerance Patient tolerated treatment well   Behavior During Therapy [facility] for tasks assessed/performed      Past Medical History:  Diagnosis Date  . Asthma   . 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 History of anemia   . History of blood transfusion 1984  . Leukopenia   . Neutropenia (HCC)     History reviewed. No pertinent surgical history.  There were no vitals filed for this visit.       Subjective Assessment - 12/28/16 1048    Subjective Patient reports increased back pain when standing for prolonged periods of time, lying on her back (needs to lift feet for suppport, sititng without support, and walking. Patient states increase strain with lifting but reports the most drastic increase in pain is when standing for prolonged periods of time. Patient reports she begins to feel nasueated and dizzy when performing prolonged standing. Patient reports after she sits and lies down for 2 minutes the pain will go away. Patient reports pain is worse with acitivity.    Pertinent History PMH: Kawaski's x 2, underweight, scoliosis, denies other PMH   Limitations Standing;Walking;Lifting   How long can you stand comfortably?   Diagnostic tests None   Patient Stated Goals to learn more about scoliosis   Currently in Pain? Yes   Pain Score 0-No pain  7/10: worst   Pain Location Back   Pain Orientation Right;Left    Pain Descriptors / Indicators Burning;Aching   Pain Type Chronic pain   Pain Onset More than a month ago   Pain Frequency Intermittent            [facility] PT Assessment - 12/28/16 1045      Assessment   Medical Diagnosis LBP   Referring Provider Lanier Ensign MD   Onset Date/Surgical Date 07/12/13   Hand Dominance Right   Next MD Visit 01/04/2017   Prior Therapy none     Balance Screen   Has the patient fallen in the past 6 months Yes   How many times? 1   Has the patient had a decrease in activity level because of a fear of falling?  Yes   Is the patient reluctant to leave their home because of a fear of falling?  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   Leisure fishing, playing with daughter     Cognition   Overall Cognitive Status Within Functional Limits for tasks assessed     Observation/Other Assessments   Other Surveys  Other Surveys   Modified [place] 40%     Sensation   Light Touch Appears Intact   Additional Comments Reflexes: 3+; Hoffman sign: neg     Functional Tests   Functional tests Squat     Squat   Comments Increased forward knee translation, increased forward lumbar flexion     Posture/Postural Control   Posture/Postural Control Postural limitations   Postural Limitations Rounded Shoulders;Forward head;Decreased lumbar lordosis  Increased lumbar flex in standing     ROM / Strength   AROM / PROM / Strength AROM;Strength     AROM   AROM Assessment Site Hip;Knee;Ankle;Lumbar    Right/Left Hip Right;Left   Right Hip Extension 20   Right Hip Flexion 120   Right Hip External Rotation  80   Right Hip ABduction 40   Left Hip Extension 20   Left Hip Flexion 120   Left Hip External Rotation  80   Left Hip ABduction 40   Right/Left Knee Right;Left   Right Knee Extension 0   Right Knee Flexion 120   Left Knee Extension 0   Left Knee Flexion 120   Right/Left Ankle Right;Left   Right Ankle Dorsiflexion 10   Right Ankle Eversion 20   Left Ankle Dorsiflexion 10   Left Ankle Eversion 20   Lumbar Flexion WNL - pain   Lumbar Extension 50% limited - pain   Lumbar - Right Side Bend WNL   Lumbar - Left Side Bend 25% limited - pain   Lumbar - Right Rotation WNL   Lumbar - Left Rotation WNL     Strength   Strength Assessment Site Ankle;Knee;Hip;Lumbar   Right/Left Hip Right;Left   Right Hip Flexion 4-/5   Right Hip Extension 3+/5   Right Hip External Rotation  4-/5   Right Hip ABduction 4-/5   Left Hip Flexion 4/5   Left Hip Extension 3+/5   Left Hip External Rotation 4-/5   Left Hip ABduction 4-/5   Right/Left Knee Left;Right   Right Knee Flexion 4/5   Right Knee Extension 4+/5   Left Knee Flexion 4/5   Left Knee Extension 4+/5   Right/Left Ankle Right;Left   Right Ankle Dorsiflexion 4-/5   Right Ankle Plantar Flexion 4-/5   Right Ankle Eversion 4+/5   Left Ankle Dorsiflexion 4-/5   Left Ankle Eversion 4-/5   Lumbar Flexion 4-/5   Lumbar Extension 4-/5     Palpation   Spinal mobility WNL L1-3; L4-S1 hypomobile - pain   SI assessment  Increased pain with P-A pressure along L SI   Palpation comment Increased TTP along B superior glute musculature and QL     Special Tests    Special Tests Lumbar   Lumbar Tests FABER test;Straight Leg Raise     FABER test   findings Negative   Comment B     Straight Leg Raise   Findings Negative   Comment B      Objective measurements completed on examination: See above findings.   TREATMENT:(Added to  HEP) Bridges -- x 10  Squats -- x 10  Prone press ups -- x 10  Clamshells        PT Education - 12/28/16 1339    Education provided Yes   Education Details HEP: Bridges, squats,  hip abduction   Person(s) Educated Patient   Methods Explanation;Demonstration   Comprehension Verbalized understanding;Returned demonstration             PT Long Term Goals - 12/28/16 1347      PT LONG TERM GOAL #1   Title Patient will be independent with HEP to continue benefits of therapy after discharge   Baseline Dependent with exercise performance and technique   Time 6   Period Weeks   Status New     PT LONG TERM GOAL #2   Title Patient will improve MODI to under 10% to demonstrate singificant improvement in lumbar functioning and less pain with functional activities.    Baseline MODI: 40%   Time 6   Period Weeks   Status New     PT LONG TERM GOAL #3   Title Patient will be able to stand for >1 hour to better allow for the performance of functional activities such as cooking and cleaning around home   Baseline can stand for without pain   Time 6   Period Weeks   Status New                Plan - 12/28/16 1341    Clinical Impression Statement Patient is a 36 yo female presenting with increased lumbar dysfunction with insidious onset which has worsened over the past 3 years. Patient demonstrates decreased hip and lumbar strength and poor coordination leading to patient increased LBP. Patient was educated to perform lumbar exercises to help combat the pain. Patient will benefit from further skilled therapy focused on improving functional strength to return to prior level of function.    History and Personal Factors relevant to plan of care: Hx of ETOH abuse, Chronic pain, underweight, current everyday smoker, Kawasaki Dz   Clinical Presentation Evolving   Clinical Presentation due to: Chronic pain, poor movement patterns, increased LE/hip weakness   Clinical Decision  Making Moderate   Rehab Potential Good   Clinical Impairments Affecting Rehab Potential (+) highly motivated (-) Current everyday smoker, ETOH abuse   PT Frequency 2x / week   PT Duration 6 weeks   PT Treatment/Interventions Dry needling;Neuromuscular re-education;Gait training;Stair training;Functional mobility training;Therapeutic activities;Therapeutic exercise;Balance training;Iontophoresis 4mg /ml Dexamethasone;Electrical Stimulation;Moist Heat;Ultrasound;Cryotherapy;Patient/family education;Manual techniques;Passive range of motion   PT Next Visit Plan progress hip strenghening   Consulted and Agree with Plan of Care Patient      Patient will benefit from skilled therapeutic intervention in order to improve the following deficits and impairments:  Decreased activity tolerance, Decreased endurance, Decreased range of motion, Decreased strength, Decreased coordination, Increased muscle spasms, Pain, Impaired sensation, Increased fascial restricitons, Abnormal gait  Visit Diagnosis: Chronic bilateral low back pain without sciatica - Plan: PT plan of care cert/re-cert  Muscle weakness (generalized) - Plan: PT plan of care cert/re-cert     Problem List Patient Active Problem List   Diagnosis Date Noted  . Other neutropenia (HCC) 06/11/2016  . Leukopenia 02/03/2015    Myrene Galas, PT DPT 12/28/2016, 2:01 PM  [place] [facility] PHYSICAL AND SPORTS MEDICINE [address] [place], [address] Phone: [phone]   Fax:  [phone]  Name: Yvette [name] MRN: [id number] Date of Birth: Dec 22, 1980

## 2017-06-13 ENCOUNTER — Inpatient Hospital Stay: Payer: Medicaid Other | Attending: Internal Medicine | Admitting: Internal Medicine

## 2017-06-13 ENCOUNTER — Other Ambulatory Visit: Payer: Self-pay

## 2017-06-13 ENCOUNTER — Inpatient Hospital Stay: Payer: Medicaid Other

## 2017-06-13 VITALS — BP 119/73 | HR 96 | Temp 97.8°F | Resp 20

## 2017-06-13 DIAGNOSIS — J45909 Unspecified asthma, uncomplicated: Secondary | ICD-10-CM | POA: Insufficient documentation

## 2017-06-13 DIAGNOSIS — Z79899 Other long term (current) drug therapy: Secondary | ICD-10-CM | POA: Diagnosis not present

## 2017-06-13 DIAGNOSIS — M303 Mucocutaneous lymph node syndrome [Kawasaki]: Secondary | ICD-10-CM | POA: Diagnosis not present

## 2017-06-13 DIAGNOSIS — F17210 Nicotine dependence, cigarettes, uncomplicated: Secondary | ICD-10-CM | POA: Diagnosis not present

## 2017-06-13 DIAGNOSIS — D708 Other neutropenia: Secondary | ICD-10-CM | POA: Diagnosis not present

## 2017-06-13 DIAGNOSIS — F1010 Alcohol abuse, uncomplicated: Secondary | ICD-10-CM

## 2017-06-13 LAB — CBC WITH DIFFERENTIAL/PLATELET
BASOS PCT: 1 %
Basophils Absolute: 0 10*3/uL (ref 0–0.1)
Eosinophils Absolute: 0.1 10*3/uL (ref 0–0.7)
Eosinophils Relative: 3 %
HEMATOCRIT: 35.3 % (ref 35.0–47.0)
HEMOGLOBIN: 11.9 g/dL — AB (ref 12.0–16.0)
LYMPHS ABS: 0.6 10*3/uL — AB (ref 1.0–3.6)
LYMPHS PCT: 31 %
MCH: 30.8 pg (ref 26.0–34.0)
MCHC: 33.7 g/dL (ref 32.0–36.0)
MCV: 91.4 fL (ref 80.0–100.0)
MONO ABS: 0.4 10*3/uL (ref 0.2–0.9)
MONOS PCT: 21 %
NEUTROS ABS: 0.9 10*3/uL — AB (ref 1.4–6.5)
NEUTROS PCT: 44 %
Platelets: 148 10*3/uL — ABNORMAL LOW (ref 150–440)
RBC: 3.86 MIL/uL (ref 3.80–5.20)
RDW: 13.7 % (ref 11.5–14.5)
WBC: 2 10*3/uL — ABNORMAL LOW (ref 3.6–11.0)

## 2017-06-13 LAB — COMPREHENSIVE METABOLIC PANEL
ALBUMIN: 4.2 g/dL (ref 3.5–5.0)
ALT: 24 U/L (ref 14–54)
ANION GAP: 12 (ref 5–15)
AST: 73 U/L — ABNORMAL HIGH (ref 15–41)
Alkaline Phosphatase: 67 U/L (ref 38–126)
BUN: 11 mg/dL (ref 6–20)
CALCIUM: 9 mg/dL (ref 8.9–10.3)
CHLORIDE: 100 mmol/L — AB (ref 101–111)
CO2: 23 mmol/L (ref 22–32)
Creatinine, Ser: 0.61 mg/dL (ref 0.44–1.00)
GFR calc non Af Amer: >60 mL/min (ref >60)
GLUCOSE: 78 mg/dL (ref 65–99)
POTASSIUM: 4.1 mmol/L (ref 3.5–5.1)
SODIUM: 135 mmol/L (ref 135–145)
Total Bilirubin: 0.5 mg/dL (ref 0.3–1.2)
Total Protein: 8.1 g/dL (ref 6.5–8.1)

## 2017-06-13 NOTE — Progress Notes (Signed)
[place] OFFICE PROGRESS NOTE  Patient Care Team: Colleen Can, MD as PCP - General (Internal Medicine)   SUMMARY OF ONCOLOGIC HISTORY:  # NOV 2015 WBC- 2.2/ANC- 1200/ALC-500; [name]; 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 Normal cytogenetics  # OCT 2016- Bil indeterminate lung nodules-CT  In July 2017- Improving.   # Hx of Kawasaki disease [age 57M & 13y]  # Alcohol abuse/Smoking  INTERVAL HISTORY:  A pleasant 36 year-old African-American female patient with above history of chronic mild leukopenia/neutropenia is here for follow-up.  Patient unfortunately continues to drink alcohol.  She also smokes.  She denies any recent fevers or hospitalizations.  No weight loss or night sweats.   She does complain of hot flashes-she is on Lupron.  She also complains of easy bruising.  REVIEW OF SYSTEMS:  A complete 10 point review of system is done which is negative except mentioned above/history of present illness.   PAST MEDICAL HISTORY :  Past Medical History:  Diagnosis Date  . Asthma   . H/O Kawasaki's disease    as 51 month old child and at 41 years  . History of anemia   . History of blood transfusion 1984  . Leukopenia   . Neutropenia ([place])     PAST SURGICAL HISTORY :  No past surgical history on file.  FAMILY HISTORY :   Family History  Problem Relation Age of Onset  . Lupus Mother   . Cancer Paternal Aunt        Breast   . Cancer Paternal Aunt        Breast     SOCIAL HISTORY:   Social History   Tobacco Use  . Smoking status: Current Every Day Smoker    Packs/day: 0.20    Years: 15.00    Pack years: 3.00    Types: Cigarettes  . Smokeless tobacco: Never Used  Substance Use Topics  . Alcohol use: Yes    Alcohol/week: 36.6 oz    Types: 40 Standard drinks or equivalent, 21 Cans of beer per week    Comment: daily  . Drug  use: No    ALLERGIES:  has No Known Allergies.  MEDICATIONS:  Current Outpatient Medications  Medication Sig Dispense Refill  . cyclobenzaprine (FLEXERIL) 5 MG tablet Take 5 mg by mouth 3 (three) times daily as needed for muscle spasms.    . diphenhydrAMINE (BENADRYL) 25 MG tablet Take 25 mg by mouth at bedtime as needed for sleep.    Marland Kitchen ibuprofen (ADVIL,MOTRIN) 200 MG tablet Take 200 mg by mouth every 6 (six) hours as needed for mild pain.    Marland Kitchen sertraline (ZOLOFT) 25 MG tablet Take 25 mg by mouth daily.     No current facility-administered medications for this visit.     PHYSICAL EXAMINATION: ECOG PERFORMANCE STATUS: 0 - Asymptomatic  BP 119/73   Pulse 96   Temp 97.8 F (36.6 C) (Tympanic)   Resp 20   There were no vitals filed for this visit.  GENERAL: Thin built ;well-developed; Alert, no distress and comfortable.  Alone; She is alone. EYES: no pallor or icterus OROPHARYNX: no thrush or ulceration; good dentition  NECK: supple, no masses felt LYMPH:  no palpable lymphadenopathy in the cervical, axillary or inguinal regions LUNGS: clear to auscultation and  No wheeze or crackles HEART/CVS: regular rate & rhythm and no murmurs; No lower extremity edema  ABDOMEN:abdomen soft, non-tender and normal bowel sounds Musculoskeletal:no cyanosis of digits and no clubbing  PSYCH: alert & oriented x 3 with fluent speech NEURO: no focal motor/sensory deficits SKIN:  no rashes or significant lesions  LABORATORY DATA:  I have reviewed the data as listed    Component Value Date/Time   NA 135 06/13/2017 1020   K 4.1 06/13/2017 1020   CL 100 (L) 06/13/2017 1020   CO2 23 06/13/2017 1020   GLUCOSE 78 06/13/2017 1020   BUN 11 06/13/2017 1020   CREATININE 0.61 06/13/2017 1020   CALCIUM 9.0 06/13/2017 1020   PROT 8.1 06/13/2017 1020   ALBUMIN 4.2 06/13/2017 1020   AST 73 (H) 06/13/2017 1020   ALT 24 06/13/2017 1020   ALKPHOS 67 06/13/2017 1020   BILITOT 0.5 06/13/2017 1020    GFRNONAA >60 06/13/2017 1020   GFRAA >60 06/13/2017 1020    No results found for: SPEP, UPEP  Lab Results  Component Value Date   WBC 2.0 (L) 06/13/2017   NEUTROABS 0.9 (L) 06/13/2017   HGB 11.9 (L) 06/13/2017   HCT 35.3 06/13/2017   MCV 91.4 06/13/2017   PLT 148 (L) 06/13/2017      Chemistry      Component Value Date/Time   NA 135 06/13/2017 1020   K 4.1 06/13/2017 1020   CL 100 (L) 06/13/2017 1020   CO2 23 06/13/2017 1020   BUN 11 06/13/2017 1020   CREATININE 0.61 06/13/2017 1020      Component Value Date/Time   CALCIUM 9.0 06/13/2017 1020   ALKPHOS 67 06/13/2017 1020   AST 73 (H) 06/13/2017 1020   ALT 24 06/13/2017 1020   BILITOT 0.5 06/13/2017 1020       RADIOGRAPHIC STUDIES: I have personally reviewed the radiological images as listed and agreed with the findings in the report. No results found.   ASSESSMENT & PLAN:  Other neutropenia ([place]) # Intermittent neutropenia white count  2-3 ANC [phone]; asymptomatic/hemoglobin 12.5 platelets 147. Likely alcohol versus other causes- likely benign ethnic neutropenia. Bone marrow biopsy- Negative for any malignancy.   # Hot flashes-? Related to depot lupron.   # easy bruising-PT/PTT normal; ? Sec to alcohol.   # Heavy alcohol- again discussed cessation. not interested in cessation.  # Patient follow-up with NP in approximately 12 months CBC CMP prior.       Cammie Sickle, MD 06/13/2017 10:50 AM

## 2017-06-13 NOTE — Progress Notes (Signed)
Patient here for follow-up for h/o neutropenia. Patient reports hot flashes, but denies any fevers or signs of infection.

## 2017-06-13 NOTE — Assessment & Plan Note (Addendum)
#  Intermittent neutropenia white count  2-3 ANC [phone]; asymptomatic/hemoglobin 12.5 platelets 147. Likely alcohol versus other causes- likely benign ethnic neutropenia. Bone marrow biopsy- Negative for any malignancy.   # Hot flashes-? Related to depot lupron.   # easy bruising-PT/PTT normal; ? Sec to alcohol.   # Heavy alcohol- again discussed cessation. not interested in cessation.  # Patient follow-up with NP in approximately 12 months CBC CMP prior.

## 2017-11-06 ENCOUNTER — Emergency Department: Payer: Medicaid Other

## 2017-11-06 ENCOUNTER — Other Ambulatory Visit: Payer: Self-pay

## 2017-11-06 ENCOUNTER — Emergency Department
Admission: EM | Admit: 2017-11-06 | Discharge: 2017-11-06 | Disposition: A | Discharge status: Home / Self Care | Payer: Medicaid Other | Attending: Emergency Medicine | Admitting: Emergency Medicine

## 2017-11-06 DIAGNOSIS — R079 Chest pain, unspecified: Secondary | ICD-10-CM | POA: Diagnosis not present

## 2017-11-06 DIAGNOSIS — F10920 Alcohol use, unspecified with intoxication, uncomplicated: Secondary | ICD-10-CM

## 2017-11-06 DIAGNOSIS — Z79899 Other long term (current) drug therapy: Secondary | ICD-10-CM | POA: Diagnosis not present

## 2017-11-06 DIAGNOSIS — F10129 Alcohol abuse with intoxication, unspecified: Secondary | ICD-10-CM | POA: Diagnosis not present

## 2017-11-06 DIAGNOSIS — F17210 Nicotine dependence, cigarettes, uncomplicated: Secondary | ICD-10-CM | POA: Diagnosis not present

## 2017-11-06 DIAGNOSIS — J45909 Unspecified asthma, uncomplicated: Secondary | ICD-10-CM | POA: Insufficient documentation

## 2017-11-06 LAB — BASIC METABOLIC PANEL
Anion gap: 13 (ref 5–15)
BUN: 7 mg/dL (ref 6–20)
CO2: 25 mmol/L (ref 22–32)
Calcium: 8.7 mg/dL — ABNORMAL LOW (ref 8.9–10.3)
Chloride: 103 mmol/L (ref 101–111)
Creatinine, Ser: 0.51 mg/dL (ref 0.44–1.00)
GFR calc Af Amer: >60 mL/min (ref >60)
Glucose, Bld: 85 mg/dL (ref 65–99)
POTASSIUM: 3.8 mmol/L (ref 3.5–5.1)
Sodium: 141 mmol/L (ref 135–145)

## 2017-11-06 LAB — CBC
HCT: 40.2 % (ref 35.0–47.0)
Hemoglobin: 13.7 g/dL (ref 12.0–16.0)
MCH: 31.7 pg (ref 26.0–34.0)
MCHC: 34.1 g/dL (ref 32.0–36.0)
MCV: 92.8 fL (ref 80.0–100.0)
PLATELETS: 153 10*3/uL (ref 150–440)
RBC: 4.33 MIL/uL (ref 3.80–5.20)
RDW: 14 % (ref 11.5–14.5)
WBC: 1.5 10*3/uL — ABNORMAL LOW (ref 3.6–11.0)

## 2017-11-06 LAB — TROPONIN I: Troponin I: <0.03 ng/mL (ref <0.03)

## 2017-11-06 LAB — FIBRIN DERIVATIVES D-DIMER (ARMC ONLY): FIBRIN DERIVATIVES D-DIMER (ARMC): 385.84 ng{FEU}/mL (ref 0.00–499.00)

## 2017-11-06 LAB — ETHANOL: Alcohol, Ethyl (B): 401 mg/dL (ref <10)

## 2017-11-06 NOTE — ED Notes (Signed)
Patient is resting comfortably. 

## 2017-11-06 NOTE — ED Triage Notes (Signed)
Pt to the er via ems for stabbing chest pain to the right side of the chest next to breast that is intermittent. Pt had two 40 oz beers or more tonight and was staggering on eMS arrival. Pain started 3 days ago.

## 2017-11-06 NOTE — ED Provider Notes (Addendum)
None  [facility] Emergency Department Provider Note   ____________________________________________   First MD Initiated Contact with Patient 11/06/17 [phone]     (approximate)  I have reviewed the triage vital signs and the nursing notes.   HISTORY  Chief Complaint Chest Pain    HPI Yvette [name] is a 37 y.o. female who comes into the hospital today with a stabbing pain to her right breast.  She states that she did have 2-40 ounce beers tonight but the pain started about 3 days ago.  The patient is seen at the cancer center for leukopenia and she has not taken anything for the pain.  The pain is to her lateral right breast and it hurts worse when she takes a deep breath in.  The patient rates her pain a 10 out of 10 in intensity currently.  She denies any fever, nausea, vomiting.  She is never had this before.  Past Medical History:  Diagnosis Date  . Asthma   . H/O Kawasaki's disease    as 13 month old child and at 70 years  . History of anemia   . History of blood transfusion 1984  . Leukopenia   . Neutropenia [facility])     Patient Active Problem List   Diagnosis Date Noted  . Other neutropenia (HCC) 06/11/2016  . Leukopenia 02/03/2015    History reviewed. No pertinent surgical history.  Prior to Admission medications   Medication Sig Start Date End Date Taking? Authorizing Provider  cyclobenzaprine (FLEXERIL) 5 MG tablet Take 5 mg by mouth 3 (three) times daily as needed for muscle spasms.    [provider]  diphenhydrAMINE (BENADRYL) 25 MG tablet Take 25 mg by mouth at bedtime as needed for sleep.    [provider]  ibuprofen (ADVIL,MOTRIN) 200 MG tablet Take 200 mg by mouth every 6 (six) hours as needed for mild pain.    [provider]  sertraline (ZOLOFT) 25 MG tablet Take 25 mg by mouth daily.    [provider]    Allergies Patient has no known allergies.  Family History  Problem Relation Age of Onset   . Lupus Mother   . Cancer Paternal Aunt        Breast   . Cancer Paternal Aunt        Breast     Social History Social History   Tobacco Use  . Smoking status: Current Every Day Smoker    Packs/day: 0.20    Years: 15.00    Pack years: 3.00    Types: Cigarettes  . Smokeless tobacco: Never Used  Substance Use Topics  . Alcohol use: Yes    Alcohol/week: 36.6 oz    Types: 21 Cans of beer, [name]  . Drug use: No    Review of Systems  Constitutional: No fever/chills Eyes: No visual changes. ENT: No sore throat. Cardiovascular: chest pain. Respiratory: shortness of breath. Gastrointestinal: No abdominal pain.  No nausea, no vomiting.  No diarrhea.  No constipation. Genitourinary: Negative for dysuria. Musculoskeletal: Negative for back pain. Skin: Negative for rash. Neurological: Negative for headaches, focal weakness or numbness.   ____________________________________________   PHYSICAL EXAM:  VITAL SIGNS: ED Triage Vitals  Enc Vitals Group     BP 11/06/17 0330 118/78     Pulse Rate 11/06/17 0330 79     Resp 11/06/17 0330 11     Temp 11/06/17 0330 (!)  97.5 F (36.4 C)     Temp Source 11/06/17 0330 Oral     SpO2 11/06/17 0328 99 %     Weight 11/06/17 0331 100 lb (45.4 kg)     Height 11/06/17 0331 5\' 1"  (1.549 m)     Head Circumference --      Peak Flow --      Pain Score 11/06/17 0331 10     Pain Loc --      Pain Edu? --      Excl. in GC? --     Constitutional: Alert and oriented. Well appearing and in mild distress. Eyes: Conjunctivae are normal. PERRL. EOMI. Head: Atraumatic. Nose: No congestion/rhinnorhea. Mouth/Throat: Mucous membranes are moist.  Oropharynx non-erythematous. Cardiovascular: Normal rate, regular rhythm. Grossly normal heart sounds.  Good peripheral circulation. Respiratory: Normal respiratory effort.  No retractions. Lungs CTAB. Gastrointestinal: Soft and nontender. No  distention.  Positive bowel sounds Musculoskeletal: No lower extremity tenderness nor edema.  Neurologic:  Normal speech and language.  Skin:  Skin is warm, dry and intact.  Psychiatric: Mood and affect are normal.  ____________________________________________   LABS (all labs ordered are listed, but only abnormal results are displayed)  Labs Reviewed  CBC - Abnormal; Notable for the following components:      Result Value   WBC 1.5 (*)    All other components within normal limits  BASIC METABOLIC PANEL - Abnormal; Notable for the following components:   Calcium 8.7 (*)    All other components within normal limits  ETHANOL - Abnormal; Notable for the following components:   Alcohol, Ethyl (B) 401 (*)    All other components within normal limits  TROPONIN I  FIBRIN DERIVATIVES D-DIMER ([facility] ONLY)   ____________________________________________  EKG  ED ECG REPORT I, Rebecka Apley, the attending physician, personally viewed and interpreted this ECG.   Date: 11/06/2017  EKG Time: 320  Rate: 97  Rhythm: normal sinus rhythm  Axis: normal  Intervals:none  ST&T Change: none  ____________________________________________  RADIOLOGY  ED MD interpretation:  CXR: Emphysematous changes and chronic bronchitis changes in the lungs  Official radiology report(s): Dg Chest 2 View  Result Date: 11/06/2017 CLINICAL DATA:  Stabbing pain to the right side of the chest. Began 3 days ago. Current smoker. EXAM: CHEST - 2 VIEW COMPARISON:  CT chest 01/19/2016 FINDINGS: Hyperinflation suggesting emphysematous change. Peribronchial thickening consistent with chronic bronchitis. No airspace disease or consolidation. No blunting of costophrenic angles. No pneumothorax. Mediastinal contours appear intact. Normal heart size and pulmonary vascularity. IMPRESSION: Emphysematous changes and chronic bronchitic changes in the lungs. No active infiltration. Electronically Signed   By: Burman Nieves M.D.   On: 11/06/2017 05:32    ____________________________________________   PROCEDURES  Procedure(s) performed: None  Procedures  Critical Care performed: No  ____________________________________________   INITIAL IMPRESSION / ASSESSMENT AND PLAN / ED COURSE  As part of my medical decision making, I reviewed the following data within the electronic MEDICAL RECORD NUMBER Notes from prior ED visits and [place] Controlled Substance Database   This is 37 year old female who comes into the hospital today with some right-sided pain.  As soon as I left the room the nurse states that the patient states her pain is gone.  My differential diagnosis includes musculoskeletal pain, pulmonary embolus, pneumonia, acute coronary syndrome.  I did check some blood work to include a CBC BMP troponin, d-dimer and an ethanol.  The patient's blood work is unremarkable aside from her  white blood cell count of 1.5 and her ethanol of 401.  I do not feel that we need to repeat the patient's troponin given her pain of 3 days and her resolution of symptoms.  We will allow the patient to metabolize until she is clinically sober or someone comes to pick her up to take her home. Dr. Cyril Loosen will reassess the patient      ____________________________________________   FINAL CLINICAL IMPRESSION(S) / ED DIAGNOSES  Final diagnoses:  Chest pain, unspecified type  Alcoholic intoxication without complication [facility])     ED Discharge Orders    None       Note:  This document was prepared using Dragon voice recognition software and may include unintentional dictation errors.   Rebecka Apley, MD 11/06/17 1610  Rebecka Apley, MD 11/06/17 [phone]

## 2017-11-06 NOTE — ED Notes (Signed)
Pt reports no pain at this time and would like her phone to call her friend who just gave birth.

## 2017-11-06 NOTE — ED Notes (Signed)
Pt is resting comfortably. Pt given a phone to call her husband.

## 2017-11-06 NOTE — ED Notes (Signed)
Pt given phone to call for ride for d/c. Pt A&Ox4, speaking in complete sentences. Denies complaints at this time. Will d/c once ride produced.

## 2017-11-06 NOTE — ED Notes (Signed)
Dc instructions discussed, pt informed to follow up with PCP and call tomorrow for appointment.  Pt left with friend.  Denies any needs at this time. Pt ambulatory to the lobby without difficulty.

## 2018-06-14 ENCOUNTER — Inpatient Hospital Stay: Payer: Medicaid Other | Admitting: Oncology

## 2018-06-14 ENCOUNTER — Inpatient Hospital Stay: Payer: Medicaid Other

## 2018-07-04 ENCOUNTER — Emergency Department
Admission: EM | Admit: 2018-07-04 | Discharge: 2018-07-05 | Disposition: A | Discharge status: Home / Self Care | Payer: Medicaid Other | Attending: Emergency Medicine | Admitting: Emergency Medicine

## 2018-07-04 ENCOUNTER — Other Ambulatory Visit: Payer: Self-pay

## 2018-07-04 DIAGNOSIS — F10220 Alcohol dependence with intoxication, uncomplicated: Secondary | ICD-10-CM | POA: Diagnosis present

## 2018-07-04 DIAGNOSIS — Z79899 Other long term (current) drug therapy: Secondary | ICD-10-CM | POA: Insufficient documentation

## 2018-07-04 DIAGNOSIS — F17210 Nicotine dependence, cigarettes, uncomplicated: Secondary | ICD-10-CM | POA: Insufficient documentation

## 2018-07-04 DIAGNOSIS — F10920 Alcohol use, unspecified with intoxication, uncomplicated: Secondary | ICD-10-CM

## 2018-07-04 LAB — HCG, QUANTITATIVE, PREGNANCY

## 2018-07-04 NOTE — Discharge Instructions (Signed)
Please seek medical attention for any high fevers, chest pain, shortness of breath, change in behavior, persistent vomiting, bloody stool or any other new or concerning symptoms.  

## 2018-07-04 NOTE — ED Notes (Signed)
Patient found to have removed IV. Spoke with boyfriend who states he will come to pick her up in the morning, states she has noone else to come get her.

## 2018-07-04 NOTE — ED Triage Notes (Signed)
Patient arrives via EMS, Per boyfriend, patient drank 25 beers, per patient, patient drank liquor. BGL 105. 20g inserted into left hand.

## 2018-07-04 NOTE — ED Provider Notes (Signed)
[facility] Emergency Department Provider Note   ____________________________________________   I have reviewed the triage vital signs and the nursing notes.   HISTORY  Chief Complaint Alcohol Intoxication   History limited by: Intoxication   HPI Yvette [name] is a 37 y.o. female who presents to the emergency department today via EMS because of concern for alcohol intoxication. Per EMS boyfriend on the scene stated she drank 25 beers. He did ask for her to be transported out of the house. Unclear if there was any other concern for the patient other than intoxication. The patient herself does appear to be intoxicated and admits to drinking alcohol but cannot state how much.    Per medical record review patient has a history of anemia.   Past Medical History:  Diagnosis Date  . Asthma   . 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 History of anemia   . History of blood transfusion 1984  . Leukopenia   . Neutropenia [facility](HCC)     Patient Active Problem List   Diagnosis Date Noted  . Other neutropenia (HCC) 06/11/2016  . Leukopenia 02/03/2015    No past surgical history on file.  Prior to Admission medications   Medication Sig Start Date End Date Taking? Authorizing Provider  cyclobenzaprine (FLEXERIL) 5 MG tablet Take 5 mg by mouth 3 (three) times daily as needed for muscle spasms.    [provider]  diphenhydrAMINE (BENADRYL) 25 MG tablet Take 25 mg by mouth at bedtime as needed for sleep.    [provider]  ibuprofen (ADVIL,MOTRIN) 200 MG tablet Take 200 mg by mouth every 6 (six) hours as needed for mild pain.    [provider]  sertraline (ZOLOFT) 25 MG tablet Take 25 mg by mouth daily.    [provider]    Allergies Patient has no known allergies.  Family History  Problem Relation Age of Onset  . Lupus Mother   . Cancer Paternal Aunt        Breast   . Cancer Paternal Aunt         Breast     Social History Social History   Tobacco Use  . Smoking status: Current Every Day Smoker    Packs/day: 0.20    Years: 15.00    Pack years: 3.00    Types: Cigarettes  . Smokeless tobacco: Never Used  Substance Use Topics  . Alcohol use: Yes    Alcohol/week: 61.0 standard drinks    Types: 21 Cans of beer, [name]  . Drug use: No    Review of Systems Unable to obtain reliable review of systems secondary to intoxication ____________________________________________   PHYSICAL EXAM:  VITAL SIGNS: ED Triage Vitals  Enc Vitals Group     BP 07/04/18 2101 116/70     Pulse Rate 07/04/18 2101 (!) 110     Resp 07/04/18 2101 16     Temp 07/04/18 2101 97.7 F (36.5 C)     Temp Source 07/04/18 2101 Oral     SpO2 07/04/18 2101 100 %     Weight --      Height --      Head Circumference --      Peak Flow --      Pain Score 07/04/18 2102 0   Constitutional: Alert and oriented.  Eyes: Conjunctivae are normal.  ENT  Head: Normocephalic and atraumatic.      Nose: No congestion/rhinnorhea.      Mouth/Throat: Mucous membranes are moist.      Neck: No stridor. Hematological/Lymphatic/Immunilogical: No cervical lymphadenopathy. Cardiovascular: Tachycardic, regular rhythm.  No murmurs, rubs, or gallops.  Respiratory: Normal respiratory effort without tachypnea nor retractions. Breath sounds are clear and equal bilaterally. No wheezes/rales/rhonchi. Gastrointestinal: Soft and non tender. No rebound. No guarding.  Genitourinary: Deferred Musculoskeletal: Normal range of motion in all extremities. No lower extremity edema. Neurologic: Intoxicated. Able to answer simple questions. Moving all extremities.  Skin:  Skin is warm, dry and intact. No rash noted. Psychiatric: Intoxicated  ____________________________________________    LABS (pertinent positives/negatives)  bhcg  <1  ____________________________________________   EKG  None  ____________________________________________    RADIOLOGY  None  ____________________________________________   PROCEDURES  Procedures  ____________________________________________   INITIAL IMPRESSION / ASSESSMENT AND PLAN / ED COURSE  Pertinent labs & imaging results that were available during my care of the patient were reviewed by me and considered in my medical decision making (see chart for details).   Patient presented to the emergency department today via EMS because of concerns for alcohol intoxication.  On exam patient appears intoxicated.  No evidence of trauma.  Patient's beta-hCG was negative.  Will observe observe for sobriety.   ____________________________________________   FINAL CLINICAL IMPRESSION(S) / ED DIAGNOSES  Final diagnoses:  Alcoholic intoxication without complication (HCC)     Note: This dictation was prepared with Dragon dictation. Any transcriptional errors that result from this process are unintentional     Phineas Semen, MD 07/04/18 [phone]

## 2018-07-05 MED ORDER — LORAZEPAM 2 MG PO TABS
2.0000 mg | ORAL_TABLET | Freq: Once | ORAL | Status: AC
  Administered 2018-07-05: 2 mg via ORAL
  Filled 2018-07-05: qty 1

## 2018-07-05 NOTE — ED Notes (Signed)
Patient resting in bed, able to wake up to answer questions.

## 2018-07-05 NOTE — ED Notes (Signed)
Patient awake, requesting to leave. Dr. York Cerise made aware.

## 2018-07-05 NOTE — ED Notes (Signed)
Pt reports decreased anxiety and "shakiness" after taking Ativan - pt reports that her boyfriend is on the way to get her - she denies need to use phone to call again

## 2018-07-05 NOTE — ED Provider Notes (Signed)
-----------------------------------------   6:31 AM on 07/05/2018 -----------------------------------------   Blood pressure (!) 118/54, pulse (!) 124, temperature 97.7 F (36.5 C), temperature source Oral, resp. rate 16, SpO2 97 %.  The patient had no acute events since last update.  She was signed out to me as alcohol intoxication requiring observation overnight because of her intoxication as well as the fact that no one would come pick her up.  She is calm and cooperative at this time.  She has been resting all night.  Her heart rate is gone up to the 120s but she is a daily drinker and is likely starting to withdraw.  She is not having any hallucinations.  I am giving her Ativan 2 mg by mouth.  She is cleared for discharge when her ride gets here.   Loleta Rose, MD 07/05/18 [phone]

## 2018-07-05 NOTE — ED Notes (Signed)
Patient given phone to attempt to call ride

## 2018-09-13 ENCOUNTER — Inpatient Hospital Stay: Payer: Medicaid Other | Admitting: Internal Medicine

## 2018-09-13 ENCOUNTER — Inpatient Hospital Stay: Payer: Medicaid Other

## 2018-09-20 ENCOUNTER — Inpatient Hospital Stay: Payer: Medicaid Other | Admitting: Internal Medicine

## 2018-09-20 ENCOUNTER — Inpatient Hospital Stay: Payer: Medicaid Other

## 2018-12-14 ENCOUNTER — Telehealth: Payer: Self-pay | Admitting: Internal Medicine

## 2018-12-15 ENCOUNTER — Inpatient Hospital Stay: Payer: Medicaid Other | Attending: Internal Medicine

## 2018-12-15 ENCOUNTER — Telehealth: Payer: Self-pay | Admitting: *Deleted

## 2018-12-15 ENCOUNTER — Other Ambulatory Visit: Payer: Self-pay

## 2018-12-15 ENCOUNTER — Inpatient Hospital Stay: Payer: Medicaid Other | Admitting: Internal Medicine

## 2018-12-15 ENCOUNTER — Other Ambulatory Visit: Payer: Self-pay | Admitting: *Deleted

## 2018-12-15 DIAGNOSIS — D708 Other neutropenia: Secondary | ICD-10-CM

## 2018-12-15 DIAGNOSIS — D696 Thrombocytopenia, unspecified: Secondary | ICD-10-CM | POA: Diagnosis present

## 2018-12-15 DIAGNOSIS — D709 Neutropenia, unspecified: Secondary | ICD-10-CM | POA: Diagnosis present

## 2018-12-15 LAB — CBC WITH DIFFERENTIAL/PLATELET
Abs Immature Granulocytes: 0.01 10*3/uL (ref 0.00–0.07)
Basophils Absolute: 0 10*3/uL (ref 0.0–0.1)
Basophils Relative: 1 %
Eosinophils Absolute: 0.1 10*3/uL (ref 0.0–0.5)
Eosinophils Relative: 3 %
HCT: 35.6 % — ABNORMAL LOW (ref 36.0–46.0)
Hemoglobin: 12 g/dL (ref 12.0–15.0)
Immature Granulocytes: 1 %
Lymphocytes Relative: 33 %
Lymphs Abs: 0.7 10*3/uL (ref 0.7–4.0)
MCH: 30.2 pg (ref 26.0–34.0)
MCHC: 33.7 g/dL (ref 30.0–36.0)
MCV: 89.4 fL (ref 80.0–100.0)
Monocytes Absolute: 0.4 10*3/uL (ref 0.1–1.0)
Monocytes Relative: 18 %
Neutro Abs: 0.9 10*3/uL — ABNORMAL LOW (ref 1.7–7.7)
Neutrophils Relative %: 44 %
Platelets: 127 10*3/uL — ABNORMAL LOW (ref 150–400)
RBC: 3.98 MIL/uL (ref 3.87–5.11)
RDW: 14.2 % (ref 11.5–15.5)
WBC: 2 10*3/uL — ABNORMAL LOW (ref 4.0–10.5)
nRBC: 0 % (ref 0.0–0.2)

## 2018-12-15 LAB — COMPREHENSIVE METABOLIC PANEL
ALT: 56 U/L — ABNORMAL HIGH (ref 0–44)
AST: 177 U/L — ABNORMAL HIGH (ref 15–41)
Albumin: 4.3 g/dL (ref 3.5–5.0)
Alkaline Phosphatase: 110 U/L (ref 38–126)
Anion gap: 13 (ref 5–15)
BUN: 6 mg/dL (ref 6–20)
CO2: 24 mmol/L (ref 22–32)
Calcium: 9 mg/dL (ref 8.9–10.3)
Chloride: 104 mmol/L (ref 98–111)
Creatinine, Ser: 0.61 mg/dL (ref 0.44–1.00)
GFR calc Af Amer: >60 mL/min (ref >60)
GFR calc non Af Amer: >60 mL/min (ref >60)
Glucose, Bld: 99 mg/dL (ref 70–99)
Potassium: 4.3 mmol/L (ref 3.5–5.1)
Sodium: 141 mmol/L (ref 135–145)
Total Bilirubin: 0.3 mg/dL (ref 0.3–1.2)
Total Protein: 8.3 g/dL — ABNORMAL HIGH (ref 6.5–8.1)

## 2018-12-15 LAB — LACTATE DEHYDROGENASE: LDH: 273 U/L — ABNORMAL HIGH (ref 98–192)

## 2018-12-15 NOTE — Telephone Encounter (Signed)
Called patient 3 times for Telehealth visit.  No answer, no voicemail, recording states "Your call cannot be completed at this time, Please try your call later".

## 2018-12-15 NOTE — Telephone Encounter (Signed)
2nd call attempt by nursing team to reach patient for virtual visit- unsuccessful. First call attempt by Raynelle Fanning, CMA at 11 am. No answer.  I personally left a vm for patient to request a return phone call.  Colette, pls r/s this phone visit.

## 2018-12-18 ENCOUNTER — Telehealth: Payer: Self-pay | Admitting: Internal Medicine

## 2018-12-19 ENCOUNTER — Encounter: Payer: Self-pay | Admitting: Internal Medicine

## 2018-12-19 ENCOUNTER — Other Ambulatory Visit: Payer: Self-pay

## 2018-12-19 ENCOUNTER — Inpatient Hospital Stay (HOSPITAL_BASED_OUTPATIENT_CLINIC_OR_DEPARTMENT_OTHER): Payer: Medicaid Other | Admitting: Internal Medicine

## 2018-12-19 ENCOUNTER — Other Ambulatory Visit: Payer: Self-pay | Admitting: *Deleted

## 2018-12-19 DIAGNOSIS — D708 Other neutropenia: Secondary | ICD-10-CM

## 2018-12-19 NOTE — Assessment & Plan Note (Addendum)
#  Intermittent neutropenia white count  2-3 ANC [phone]; today absolute neutrophil count is 900; hemoglobin 12 platelets 127.  Asymptomatic/benign ethnic neutropenia.  Previous bone marrow biopsy negative.  # Heavy alcohol-again discussed cessation patient not interested.  #Question acute bronchitis-recommend symptomatic treatment with antihistamines/Mucinex.  Defer further management to PCP.  # DISPOSITION: # 12 months- MD-labs-cbc/cmp/ldh-Dr.B

## 2018-12-19 NOTE — Progress Notes (Signed)
I connected with [name] on 12/19/18 at  9:30 AM EDT by telephone visit and verified that I am speaking with the correct person using two identifiers.  I discussed the limitations, risks, security and privacy concerns of performing an evaluation and management service by telemedicine and the availability of in-person appointments. I also discussed with the patient that there may be a patient responsible charge related to this service. The patient expressed understanding and agreed to proceed.    Other persons participating in the visit and their role in the encounter: RN/medical reconciliation Patient's location: Home Provider's location: Home   No history exists.     Chief Complaint: Neutropenia thrombocytopenia   History of present illness:Yvette [name] 38 y.o.  female with history of chronic intermittent neutropenia/thrombocytopenia is here for follow-up.  Patient denies any frequent infections.  Denies any frequent hospitalization for pneumonia or sinusitis.  Patient also have chest tightness and cough intermittent wheezing.  Shortness of breath on exertion.  Denies any fevers.  Denies any chills.  Unfortunately continues smoke/she also continues to drink alcohol heavily.  Observation/objective:  Assessment and plan: Other neutropenia ([place]) # Intermittent neutropenia white count  2-3 ANC [phone]; today absolute neutrophil count is 900; hemoglobin 12 platelets 127.  Asymptomatic/benign ethnic neutropenia.  Previous bone marrow biopsy negative.  # Heavy alcohol-again discussed cessation patient not interested.  #Question acute bronchitis-recommend symptomatic treatment with antihistamines/Mucinex.  Defer further management to PCP.  # DISPOSITION: # 12 months- MD-labs-cbc/cmp/ldh-Dr.B    Follow-up instructions:  I discussed the assessment and treatment plan with the patient.  The patient was provided an opportunity to ask questions and all were answered.  The patient  agreed with the plan and demonstrated understanding of instructions.  The patient was advised to call back or seek an in person evaluation if the symptoms worsen or if the condition fails to improve as anticipated.  I provided 12 minutes of non face-to-face telephone visit time during this encounter, and > 50% was spent counseling as documented under my assessment & plan.   Dr. Charlaine Dalton Taney at [facility] 12/19/2018 3:08 PM

## 2018-12-30 ENCOUNTER — Other Ambulatory Visit: Payer: Self-pay

## 2018-12-30 ENCOUNTER — Emergency Department: Payer: Medicaid Other

## 2018-12-30 ENCOUNTER — Encounter: Payer: Self-pay | Admitting: Emergency Medicine

## 2018-12-30 DIAGNOSIS — M79641 Pain in right hand: Secondary | ICD-10-CM | POA: Insufficient documentation

## 2018-12-30 DIAGNOSIS — Z5321 Procedure and treatment not carried out due to patient leaving prior to being seen by health care provider: Secondary | ICD-10-CM | POA: Insufficient documentation

## 2018-12-30 NOTE — ED Triage Notes (Signed)
Pt arrives POV to triage with c/o "I was stupid and I punched a wall". Pt has noted swelling to right hand at this time but is otherwise in NAD.

## 2018-12-31 ENCOUNTER — Emergency Department
Admission: EM | Admit: 2018-12-31 | Discharge: 2018-12-31 | Disposition: A | Discharge status: Home / Self Care | Payer: Medicaid Other | Attending: Emergency Medicine | Admitting: Emergency Medicine

## 2019-01-01 ENCOUNTER — Telehealth: Payer: Self-pay | Admitting: Emergency Medicine

## 2019-01-01 NOTE — Telephone Encounter (Signed)
Called patient due to lwot to inquire about condition and follow up plans. She does have a fracture on her xray.  She will need to return here or go to UC or pcp to follow up.  I left her a message asking her to call me.

## 2019-08-14 ENCOUNTER — Telehealth: Payer: Self-pay | Admitting: *Deleted

## 2019-08-14 NOTE — Telephone Encounter (Signed)
Patient called reporting that she had her physical and that her PCP wants her to see [place] prior to her Jun appointment due to lab results. She does not know what results and I have asked her to contact them to send a fax to [place] so that we can determine when we need to see her. Please watch for a fax from [facility] health  Actually there is a fax from them under Media for a referral with results of WBC 2.2 and ANC of 1.0. Please advise

## 2019-08-15 NOTE — Telephone Encounter (Signed)
Dr. Leonard Schwartz- please review labs and let Colette know when to schedule the patient. She is not due to come back until 6 months from now. Pcp is requesting a sooner apt.

## 2019-08-15 NOTE — Telephone Encounter (Signed)
Please have pt follow up with me next week-MD; labs; cbc/cmp/ldh- Thanks GB

## 2019-08-17 ENCOUNTER — Other Ambulatory Visit: Payer: Self-pay

## 2019-08-20 ENCOUNTER — Inpatient Hospital Stay: Payer: Medicaid Other | Admitting: Internal Medicine

## 2019-08-20 ENCOUNTER — Inpatient Hospital Stay: Payer: Medicaid Other | Attending: Internal Medicine

## 2019-08-20 ENCOUNTER — Other Ambulatory Visit: Payer: Self-pay

## 2019-08-20 DIAGNOSIS — D708 Other neutropenia: Secondary | ICD-10-CM

## 2019-08-20 LAB — CBC WITH DIFFERENTIAL/PLATELET
Abs Immature Granulocytes: 0 10*3/uL (ref 0.00–0.07)
Basophils Absolute: 0 10*3/uL (ref 0.0–0.1)
Basophils Relative: 1 %
Eosinophils Absolute: 0 10*3/uL (ref 0.0–0.5)
Eosinophils Relative: 2 %
HCT: 38.3 % (ref 36.0–46.0)
Hemoglobin: 12.6 g/dL (ref 12.0–15.0)
Immature Granulocytes: 0 %
Lymphocytes Relative: 26 %
Lymphs Abs: 0.5 10*3/uL — ABNORMAL LOW (ref 0.7–4.0)
MCH: 29.7 pg (ref 26.0–34.0)
MCHC: 32.9 g/dL (ref 30.0–36.0)
MCV: 90.3 fL (ref 80.0–100.0)
Monocytes Absolute: 0.5 10*3/uL (ref 0.1–1.0)
Monocytes Relative: 22 %
Neutro Abs: 1 10*3/uL — ABNORMAL LOW (ref 1.7–7.7)
Neutrophils Relative %: 49 %
Platelets: 152 10*3/uL (ref 150–400)
RBC: 4.24 MIL/uL (ref 3.87–5.11)
RDW: 13.2 % (ref 11.5–15.5)
WBC: 2 10*3/uL — ABNORMAL LOW (ref 4.0–10.5)
nRBC: 0 % (ref 0.0–0.2)

## 2019-08-20 LAB — BASIC METABOLIC PANEL
Anion gap: 13 (ref 5–15)
BUN: 7 mg/dL (ref 6–20)
CO2: 21 mmol/L — ABNORMAL LOW (ref 22–32)
Calcium: 9.1 mg/dL (ref 8.9–10.3)
Chloride: 99 mmol/L (ref 98–111)
Creatinine, Ser: 0.53 mg/dL (ref 0.44–1.00)
GFR calc Af Amer: >60 mL/min (ref >60)
GFR calc non Af Amer: >60 mL/min (ref >60)
Glucose, Bld: 106 mg/dL — ABNORMAL HIGH (ref 70–99)
Potassium: 3.6 mmol/L (ref 3.5–5.1)
Sodium: 133 mmol/L — ABNORMAL LOW (ref 135–145)

## 2019-08-20 LAB — LACTATE DEHYDROGENASE: LDH: 237 U/L — ABNORMAL HIGH (ref 98–192)

## 2019-08-20 NOTE — Progress Notes (Unsigned)
[place] OFFICE PROGRESS NOTE  Patient Care Team: Colleen Can, MD as PCP - General (Internal Medicine)   SUMMARY OF ONCOLOGIC HISTORY:  # NOV 2015 WBC- 2.2/ANC- 1200/ALC-500; [name]; 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 Normal cytogenetics  # OCT 2016- Bil indeterminate lung nodules-CT  In July 2017- Improving.   # Hx of Kawasaki disease [age 49M & 13y]  # Alcohol abuse/Smoking  INTERVAL HISTORY:  A pleasant 39 year-old African-American female patient with above history of chronic mild leukopenia/neutropenia is here for follow-up...   Chest pain- 6-8 months.. on reflux- not helping.. ? With eating..   Patient unfortunately continues to drink alcohol.  She also smokes.  She denies any recent fevers or hospitalizations.  No weight loss or night sweats.   She does complain of hot flashes-she is on Lupron.  She also complains of easy bruising.  Review of Systems  Constitutional: Negative for chills, diaphoresis, fever, malaise/fatigue and weight loss.  HENT: Negative for nosebleeds and sore throat.   Eyes: Negative for double vision.  Respiratory: Negative for cough, hemoptysis, sputum production, shortness of breath and wheezing.   Cardiovascular: Negative for chest pain, palpitations, orthopnea and leg swelling.  Gastrointestinal: Negative for abdominal pain, blood in stool, constipation, diarrhea, heartburn, melena, nausea and vomiting.  Genitourinary: Negative for dysuria, frequency and urgency.  Musculoskeletal: Negative for back pain and joint pain.  Skin: Negative.  Negative for itching and rash.  Neurological: Negative for dizziness, tingling, focal weakness, weakness and headaches.  Endo/Heme/Allergies: Does not bruise/bleed easily.  Psychiatric/Behavioral: Negative for depression. The patient is not nervous/anxious and does not  have insomnia.     PAST MEDICAL HISTORY :  Past Medical History:  Diagnosis Date  . Asthma   . H/O Kawasaki's disease    as 55 month old child and at 74 years  . History of anemia   . History of blood transfusion 1984  . Leukopenia   . Neutropenia ([place])     PAST SURGICAL HISTORY :  No past surgical history on file.  FAMILY HISTORY :   Family History  Problem Relation Age of Onset  . Lupus Mother   . Cancer Paternal Aunt        Breast   . Cancer Paternal Aunt        Breast     SOCIAL HISTORY:   Social History   Tobacco Use  . Smoking status: Current Every Day Smoker    Packs/day: 0.20    Years: 15.00    Pack years: 3.00    Types: Cigarettes  . Smokeless tobacco: Never Used  Substance Use Topics  . Alcohol use: Yes    Alcohol/week: 61.0 standard drinks    Types: 21 Cans of beer, [name]  . Drug use: No    ALLERGIES:  has No Known Allergies.  MEDICATIONS:  Current Outpatient Medications  Medication Sig Dispense Refill  . ibuprofen (ADVIL) 100 MG tablet Take 200 mg by mouth every 6 (six) hours as needed for fever.    . mirtazapine (REMERON) 15 MG tablet Take 15 mg by mouth at bedtime.    Marland Kitchen omeprazole (PRILOSEC) 20 MG capsule Take 20 mg by mouth daily.     No current facility-administered medications for this visit.    PHYSICAL EXAMINATION: ECOG PERFORMANCE STATUS: 0 - Asymptomatic  BP Marland Kitchen)  143/93 (BP Location: Left Arm, Patient Position: Sitting, Cuff Size: Normal)   Pulse 86   Temp 97.9 F (36.6 C) (Tympanic)   Wt 105 lb (47.6 kg)   BMI 19.52 kg/m   Filed Weights   08/20/19 1458  Weight: 105 lb (47.6 kg)    Physical Exam  Constitutional: She is oriented to person, place, and time and well-developed, well-nourished, and in no distress.  HENT:  Head: Normocephalic and atraumatic.  Mouth/Throat: Oropharynx is clear and moist. No oropharyngeal exudate.  Eyes: Pupils are equal, round, and reactive  to light.  Cardiovascular: Normal rate and regular rhythm.  Pulmonary/Chest: No respiratory distress. She has no wheezes.  Abdominal: Soft. Bowel sounds are normal. She exhibits no distension and no mass. There is no abdominal tenderness. There is no rebound and no guarding.  Musculoskeletal:        General: No tenderness or edema. Normal range of motion.     Cervical back: Normal range of motion and neck supple.  Neurological: She is alert and oriented to person, place, and time.  Skin: Skin is warm.  Psychiatric: Affect normal.     LABORATORY DATA:  I have reviewed the data as listed    Component Value Date/Time   NA 133 (L) 08/20/2019 1429   K 3.6 08/20/2019 1429   CL 99 08/20/2019 1429   CO2 21 (L) 08/20/2019 1429   GLUCOSE 106 (H) 08/20/2019 1429   BUN 7 08/20/2019 1429   CREATININE 0.53 08/20/2019 1429   CALCIUM 9.1 08/20/2019 1429   PROT 8.3 (H) 12/15/2018 0933   ALBUMIN 4.3 12/15/2018 0933   AST 177 (H) 12/15/2018 0933   ALT 56 (H) 12/15/2018 0933   ALKPHOS 110 12/15/2018 0933   BILITOT 0.3 12/15/2018 0933   GFRNONAA >60 08/20/2019 1429   GFRAA >60 08/20/2019 1429    No results found for: SPEP, UPEP  Lab Results  Component Value Date   WBC 2.0 (L) 08/20/2019   NEUTROABS 1.0 (L) 08/20/2019   HGB 12.6 08/20/2019   HCT 38.3 08/20/2019   MCV 90.3 08/20/2019   PLT 152 08/20/2019      Chemistry      Component Value Date/Time   NA 133 (L) 08/20/2019 1429   K 3.6 08/20/2019 1429   CL 99 08/20/2019 1429   CO2 21 (L) 08/20/2019 1429   BUN 7 08/20/2019 1429   CREATININE 0.53 08/20/2019 1429      Component Value Date/Time   CALCIUM 9.1 08/20/2019 1429   ALKPHOS 110 12/15/2018 0933   AST 177 (H) 12/15/2018 0933   ALT 56 (H) 12/15/2018 0933   BILITOT 0.3 12/15/2018 0933       RADIOGRAPHIC STUDIES: I have personally reviewed the radiological images as listed and agreed with the findings in the report. No results found.   ASSESSMENT & PLAN:  No  problem-specific Assessment & Plan notes found for this encounter.      Earna Coder, MD 08/20/2019 3:20 PM

## 2019-08-20 NOTE — Assessment & Plan Note (Signed)
#  Intermittent neutropenia white count  2-3 ANC [phone]; today absolute neutrophil count is 900; hemoglobin 12 platelets 127.  Asymptomatic/benign ethnic neutropenia.  Previous bone marrow biopsy negative.  # Heavy alcohol-again discussed cessation patient not interested.  #chest pains- ? EGD-   #   # DISPOSITION: # 12 months- MD-labs-cbc/cmp/ldh-Dr.B  Cc; Therapist, nutritional.

## 2019-08-23 ENCOUNTER — Other Ambulatory Visit: Payer: Self-pay

## 2019-08-23 ENCOUNTER — Encounter: Payer: Self-pay | Admitting: Emergency Medicine

## 2019-08-23 ENCOUNTER — Emergency Department: Payer: Medicaid Other

## 2019-08-23 ENCOUNTER — Observation Stay
Admission: EM | Admit: 2019-08-23 | Discharge: 2019-08-24 | Disposition: A | Discharge status: Home / Self Care | Payer: Medicaid Other | Attending: Internal Medicine | Admitting: Internal Medicine

## 2019-08-23 DIAGNOSIS — K7010 Alcoholic hepatitis without ascites: Secondary | ICD-10-CM | POA: Insufficient documentation

## 2019-08-23 DIAGNOSIS — R112 Nausea with vomiting, unspecified: Secondary | ICD-10-CM | POA: Diagnosis present

## 2019-08-23 DIAGNOSIS — D72819 Decreased white blood cell count, unspecified: Secondary | ICD-10-CM | POA: Insufficient documentation

## 2019-08-23 DIAGNOSIS — E860 Dehydration: Principal | ICD-10-CM | POA: Insufficient documentation

## 2019-08-23 DIAGNOSIS — A059 Bacterial foodborne intoxication, unspecified: Secondary | ICD-10-CM | POA: Diagnosis not present

## 2019-08-23 DIAGNOSIS — E872 Acidosis: Secondary | ICD-10-CM | POA: Insufficient documentation

## 2019-08-23 DIAGNOSIS — Z20822 Contact with and (suspected) exposure to covid-19: Secondary | ICD-10-CM | POA: Insufficient documentation

## 2019-08-23 DIAGNOSIS — Z79899 Other long term (current) drug therapy: Secondary | ICD-10-CM | POA: Insufficient documentation

## 2019-08-23 DIAGNOSIS — F17210 Nicotine dependence, cigarettes, uncomplicated: Secondary | ICD-10-CM | POA: Insufficient documentation

## 2019-08-23 DIAGNOSIS — E876 Hypokalemia: Secondary | ICD-10-CM | POA: Diagnosis not present

## 2019-08-23 LAB — BASIC METABOLIC PANEL
Anion gap: 21 — ABNORMAL HIGH (ref 5–15)
BUN: 11 mg/dL (ref 6–20)
CO2: 18 mmol/L — ABNORMAL LOW (ref 22–32)
Calcium: 10.1 mg/dL (ref 8.9–10.3)
Chloride: 98 mmol/L (ref 98–111)
Creatinine, Ser: 0.78 mg/dL (ref 0.44–1.00)
GFR calc Af Amer: >60 mL/min (ref >60)
GFR calc non Af Amer: >60 mL/min (ref >60)
Glucose, Bld: 119 mg/dL — ABNORMAL HIGH (ref 70–99)
Potassium: 3.7 mmol/L (ref 3.5–5.1)
Sodium: 137 mmol/L (ref 135–145)

## 2019-08-23 LAB — HEPATIC FUNCTION PANEL
ALT: 48 U/L — ABNORMAL HIGH (ref 0–44)
AST: 70 U/L — ABNORMAL HIGH (ref 15–41)
Albumin: 5 g/dL (ref 3.5–5.0)
Alkaline Phosphatase: 81 U/L (ref 38–126)
Bilirubin, Direct: 0.1 mg/dL (ref 0.0–0.2)
Indirect Bilirubin: 0.9 mg/dL (ref 0.3–0.9)
Total Bilirubin: 1 mg/dL (ref 0.3–1.2)
Total Protein: 9.3 g/dL — ABNORMAL HIGH (ref 6.5–8.1)

## 2019-08-23 LAB — CBC
HCT: 41.2 % (ref 36.0–46.0)
Hemoglobin: 13.9 g/dL (ref 12.0–15.0)
MCH: 30.2 pg (ref 26.0–34.0)
MCHC: 33.7 g/dL (ref 30.0–36.0)
MCV: 89.4 fL (ref 80.0–100.0)
Platelets: 166 10*3/uL (ref 150–400)
RBC: 4.61 MIL/uL (ref 3.87–5.11)
RDW: 13 % (ref 11.5–15.5)
WBC: 9.6 10*3/uL (ref 4.0–10.5)
nRBC: 0 % (ref 0.0–0.2)

## 2019-08-23 LAB — TROPONIN I (HIGH SENSITIVITY): Troponin I (High Sensitivity): 3 ng/L (ref <18)

## 2019-08-23 LAB — LIPASE, BLOOD: Lipase: 18 U/L (ref 11–51)

## 2019-08-23 LAB — SARS CORONAVIRUS 2 (TAT 6-24 HRS): SARS Coronavirus 2: NEGATIVE

## 2019-08-23 LAB — MAGNESIUM: Magnesium: 1.9 mg/dL (ref 1.7–2.4)

## 2019-08-23 MED ORDER — ONDANSETRON HCL 4 MG/2ML IJ SOLN
4.0000 mg | Freq: Four times a day (QID) | INTRAMUSCULAR | Status: DC | PRN
  Administered 2019-08-24: 4 mg via INTRAVENOUS
  Filled 2019-08-23: qty 2

## 2019-08-23 MED ORDER — HEPARIN SODIUM (PORCINE) 5000 UNIT/ML IJ SOLN
5000.0000 [IU] | Freq: Three times a day (TID) | INTRAMUSCULAR | Status: DC
  Administered 2019-08-23 – 2019-08-24 (×2): 5000 [IU] via SUBCUTANEOUS
  Filled 2019-08-23 (×2): qty 1

## 2019-08-23 MED ORDER — HALOPERIDOL LACTATE 5 MG/ML IJ SOLN
2.5000 mg | Freq: Once | INTRAMUSCULAR | Status: AC
  Administered 2019-08-23: 2.5 mg via INTRAVENOUS
  Filled 2019-08-23: qty 1

## 2019-08-23 MED ORDER — DIPHENHYDRAMINE HCL 50 MG/ML IJ SOLN
25.0000 mg | Freq: Once | INTRAMUSCULAR | Status: AC
  Administered 2019-08-23: 25 mg via INTRAVENOUS
  Filled 2019-08-23: qty 1

## 2019-08-23 MED ORDER — ONDANSETRON 4 MG PO TBDP
4.0000 mg | ORAL_TABLET | Freq: Once | ORAL | Status: AC | PRN
  Administered 2019-08-23: 4 mg via ORAL

## 2019-08-23 MED ORDER — MIRTAZAPINE 15 MG PO TABS
15.0000 mg | ORAL_TABLET | Freq: Every day | ORAL | Status: DC
  Administered 2019-08-24: 15 mg via ORAL
  Filled 2019-08-23: qty 1

## 2019-08-23 MED ORDER — SODIUM CHLORIDE 0.9 % IV BOLUS
1000.0000 mL | Freq: Once | INTRAVENOUS | Status: AC
  Administered 2019-08-23: 1000 mL via INTRAVENOUS

## 2019-08-23 MED ORDER — SODIUM BICARBONATE 8.4 % IV SOLN
INTRAVENOUS | Status: DC
  Filled 2019-08-23 (×4): qty 100

## 2019-08-23 MED ORDER — PROMETHAZINE HCL 25 MG PO TABS: 12.5000 mg | ORAL_TABLET | Freq: Four times a day (QID) | ORAL | Status: DC | PRN

## 2019-08-23 MED ORDER — SODIUM CHLORIDE 0.9 % IV BOLUS
1000.0000 mL | Freq: Once | INTRAVENOUS | Status: AC
  Administered 2019-08-23: 12:00:00 1000 mL via INTRAVENOUS

## 2019-08-23 MED ORDER — LORAZEPAM 2 MG/ML IJ SOLN: 0.5000 mg | Freq: Four times a day (QID) | INTRAMUSCULAR | Status: DC | PRN

## 2019-08-23 MED ORDER — ACETAMINOPHEN 650 MG RE SUPP: 650.0000 mg | Freq: Four times a day (QID) | RECTAL | Status: DC | PRN

## 2019-08-23 MED ORDER — ONDANSETRON HCL 4 MG/2ML IJ SOLN
4.0000 mg | Freq: Once | INTRAMUSCULAR | Status: AC
  Administered 2019-08-23: 12:00:00 4 mg via INTRAVENOUS
  Filled 2019-08-23: qty 2

## 2019-08-23 MED ORDER — ONDANSETRON HCL 4 MG PO TABS: 4.0000 mg | ORAL_TABLET | Freq: Four times a day (QID) | ORAL | Status: DC | PRN

## 2019-08-23 MED ORDER — PROMETHAZINE HCL 25 MG/ML IJ SOLN
12.5000 mg | Freq: Once | INTRAMUSCULAR | Status: AC
  Administered 2019-08-23: 12:00:00 12.5 mg via INTRAVENOUS
  Filled 2019-08-23: qty 1

## 2019-08-23 MED ORDER — HALOPERIDOL LACTATE 5 MG/ML IJ SOLN: 2.5000 mg | Freq: Once | INTRAMUSCULAR | Status: DC

## 2019-08-23 MED ORDER — PANTOPRAZOLE SODIUM 40 MG IV SOLR
40.0000 mg | Freq: Once | INTRAVENOUS | Status: AC
  Administered 2019-08-23: 15:00:00 40 mg via INTRAVENOUS
  Filled 2019-08-23: qty 40

## 2019-08-23 MED ORDER — ACETAMINOPHEN 325 MG PO TABS: 650.0000 mg | ORAL_TABLET | Freq: Four times a day (QID) | ORAL | Status: DC | PRN

## 2019-08-23 MED ORDER — ONDANSETRON 4 MG PO TBDP
ORAL_TABLET | ORAL | Status: AC
  Filled 2019-08-23: qty 1

## 2019-08-23 MED ORDER — DICYCLOMINE HCL 10 MG/ML IM SOLN
20.0000 mg | Freq: Once | INTRAMUSCULAR | Status: AC
  Administered 2019-08-23: 13:00:00 20 mg via INTRAMUSCULAR
  Filled 2019-08-23: qty 2

## 2019-08-23 NOTE — ED Triage Notes (Addendum)
Patient to ER for c/o vomiting (17 episodes) with blood seen in emesis (began at 10pm last night). Patient also reports feeling weak, "like I'm going to pass out". +Chest pain.

## 2019-08-23 NOTE — ED Provider Notes (Signed)
[facility] Emergency Department Provider Note  ____________________________________________  Time seen: Approximately 2:10 PM  I have reviewed the triage vital signs and the nursing notes.   HISTORY  Chief Complaint Emesis and Weakness    HPI Yvette [name] is a 39 y.o. female with a history of anemia, chronic leukopenia, asthma who comes the ED complaining of upper abdominal pain and multiple episodes of vomiting.  She was in her usual state of health until dinnertime yesterday when she ate some fish that came frozen from the grocery.  She had left it on the counter to fall, prepared and after eating she started having nausea and a feeling of malaise.  She soon started vomiting and has vomited almost 20 times since last night after dinner.  Unable to eat or drink anything.  Has upper abdominal pain that is nonradiating, no aggravating or alleviating factors.  Denies constipation or diarrhea.  No fevers or chills or body aches.  Denies rash or itching, shortness of breath or wheezing.   Follows up with cancer center for monitoring of her chronic leukopenia.  Reports a negative bone marrow biopsy in the past.   Past Medical History:  Diagnosis Date  . Asthma   . 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 History of anemia   . History of blood transfusion 1984  . Leukopenia   . Neutropenia [facility])      Patient Active Problem List   Diagnosis Date Noted  . Other neutropenia ([place]) 06/11/2016  . Leukopenia 02/03/2015     History reviewed. No pertinent surgical history.   Prior to Admission medications   Medication Sig Start Date End Date Taking? Authorizing Provider  ibuprofen (ADVIL) 100 MG tablet Take 200 mg by mouth every 6 (six) hours as needed for fever.    [provider]  mirtazapine (REMERON) 15 MG tablet Take 15 mg by mouth at bedtime.    [provider]  omeprazole (PRILOSEC) 20 MG capsule Take 20 mg by  mouth daily.    [provider]     Allergies Patient has no known allergies.   Family History  Problem Relation Age of Onset  . Lupus Mother   . Cancer Paternal Aunt        Breast   . Cancer Paternal Aunt        Breast     Social History Social History   Tobacco Use  . Smoking status: Current Every Day Smoker    Packs/day: 0.20    Years: 15.00    Pack years: 3.00    Types: Cigarettes  . Smokeless tobacco: Never Used  Substance Use Topics  . Alcohol use: Yes    Alcohol/week: 61.0 standard drinks    Types: 21 Cans of beer, [name]  . Drug use: No    Review of Systems  Constitutional:   No fever or chills.  ENT:   No sore throat. No rhinorrhea. Cardiovascular:   No chest pain or syncope. Respiratory:   No dyspnea or cough. Gastrointestinal:   Positive as above for upper abdominal pain and vomiting Musculoskeletal:   Negative for focal pain or swelling All other systems reviewed and are negative except as documented above in ROS and HPI.  ____________________________________________   PHYSICAL EXAM:  VITAL SIGNS: ED Triage Vitals  Enc Vitals Group     BP 08/23/19 0917 Marland Kitchen)  151/88     Pulse Rate 08/23/19 0917 65     Resp 08/23/19 0917 18     Temp 08/23/19 0917 97.9 F (36.6 C)     Temp Source 08/23/19 0917 Oral     SpO2 08/23/19 0917 100 %     Weight 08/23/19 0918 105 lb (47.6 kg)     Height 08/23/19 0918 5' (1.524 m)     Head Circumference --      Peak Flow --      Pain Score 08/23/19 0918 7     Pain Loc --      Pain Edu? --      Excl. in [place]? --     Vital signs reviewed, nursing assessments reviewed.   Constitutional:   Alert and oriented. Non-toxic appearance. Eyes:   Conjunctivae are normal. EOMI. PERRL. ENT      Head:   Normocephalic and atraumatic.      Nose:   Wearing a mask.      Mouth/Throat:   Wearing a mask.      Neck:   No meningismus. Full  ROM. Hematological/Lymphatic/Immunilogical:   No cervical lymphadenopathy. Cardiovascular:   RRR. Symmetric bilateral radial and DP pulses.  No murmurs. Cap refill less than 2 seconds. Respiratory:   Normal respiratory effort without tachypnea/retractions. Breath sounds are clear and equal bilaterally. No wheezes/rales/rhonchi. Gastrointestinal:   Soft and nontender. Non distended. There is no CVA tenderness.  No rebound, rigidity, or guarding. Musculoskeletal:   Normal range of motion in all extremities. No joint effusions.  No lower extremity tenderness.  No edema. Neurologic:   Normal speech and language.  Motor grossly intact. No acute focal neurologic deficits are appreciated.  Skin:    Skin is warm, dry and intact. No rash noted.  No petechiae, purpura, or bullae.  ____________________________________________    LABS (pertinent positives/negatives) (all labs ordered are listed, but only abnormal results are displayed) Labs Reviewed  BASIC METABOLIC PANEL - Abnormal; Notable for the following components:      Result Value   CO2 18 (*)    Glucose, Bld 119 (*)    Anion gap 21 (*)    All other components within normal limits  HEPATIC FUNCTION PANEL - Abnormal; Notable for the following components:   Total Protein 9.3 (*)    AST 70 (*)    ALT 48 (*)    All other components within normal limits  CBC  LIPASE, BLOOD  URINALYSIS, COMPLETE (UACMP) WITH MICROSCOPIC  POC URINE PREG, ED  TROPONIN I (HIGH SENSITIVITY)   ____________________________________________   EKG  Interpreted by me Normal sinus rhythm rate of 79, normal axis and intervals.  Poor R wave progression.  Normal ST segments and T waves.  ____________________________________________    [id number]  DG Chest 2 View  Result Date: 08/23/2019 CLINICAL DATA:  Chest pain EXAM: CHEST - 2 VIEW COMPARISON:  November 06, 2017. FINDINGS: The lungs are clear. The heart size and pulmonary vascularity are normal. No  adenopathy. There is thoracolumbar levoscoliosis. No pneumothorax. IMPRESSION: Lungs clear.  Cardiac silhouette within normal limits. Electronically Signed   By: Lowella Grip III M.D.   On: 08/23/2019 09:49    ____________________________________________   PROCEDURES Procedures  ____________________________________________  DIFFERENTIAL DIAGNOSIS   GERD, gastritis, food poisoning, scombroid.  CLINICAL IMPRESSION / ASSESSMENT AND PLAN / ED COURSE  Medications ordered in the ED: Medications  ondansetron (ZOFRAN-ODT) disintegrating tablet 4 mg (4 mg Oral Given 08/23/19 0929)  diphenhydrAMINE (BENADRYL) injection  25 mg (25 mg Intravenous Given 08/23/19 1146)  sodium chloride 0.9 % bolus 1,000 mL (0 mLs Intravenous Stopped 08/23/19 1327)  ondansetron (ZOFRAN) injection 4 mg (4 mg Intravenous Given 08/23/19 1142)  dicyclomine (BENTYL) injection 20 mg (20 mg Intramuscular Given 08/23/19 1234)  promethazine (PHENERGAN) injection 12.5 mg (12.5 mg Intravenous Given 08/23/19 1229)  haloperidol lactate (HALDOL) injection 2.5 mg (2.5 mg Intravenous Given 08/23/19 1418)  sodium chloride 0.9 % bolus 1,000 mL (1,000 mLs Intravenous New Bag/Given 08/23/19 1422)  pantoprazole (PROTONIX) injection 40 mg (40 mg Intravenous Given 08/23/19 1528)    Pertinent labs & imaging results that were available during my care of the patient were reviewed by me and considered in my medical decision making (see chart for details).  Yvette [name] was evaluated in Emergency Department on 08/23/2019 for the symptoms described in the history of present illness. She was evaluated in the context of the global COVID-19 pandemic, which necessitated consideration that the patient might be at risk for infection with the SARS-CoV-2 virus that causes COVID-19. Institutional protocols and algorithms that pertain to the evaluation of patients at risk for COVID-19 are in a state of rapid change based on information released by regulatory  bodies including the CDC and federal and state organizations. These policies and algorithms were followed during the patient's care in the ED.   Patient presents with abdominal pain and vomiting.  Vital signs unremarkable.  Labs are all reassuring.  Considering the patient's symptoms, medical history, and physical examination today, I have low suspicion for cholecystitis or biliary pathology, pancreatitis, perforation or bowel obstruction, hernia, intra-abdominal abscess, AAA or dissection, volvulus or intussusception, mesenteric ischemia, or appendicitis.  We will give the patient IV fluids, antiemetics.     Clinical Course as of Aug 22 1732  Thu Aug 23, 2019  1415 Patient reports that Phenergan did help with her nausea some, but when prompted to p.o. trial, she states that her nausea is worse again.  I will try low-dose of Haldol with further IV fluids.  If symptoms are not improving she may require hospitalization for intractable vomiting.   [PS]  1733 Patient reports persistent nausea, unable to eat or drink despite multiple attempts to p.o. trial.  At this point I will consult hospitalist for further management of intractable vomiting   [PS]    Clinical Course User Index [PS] Carrie Mew, MD     ____________________________________________   FINAL CLINICAL IMPRESSION(S) / ED DIAGNOSES    Final diagnoses:  Food poisoning  Non-intractable vomiting with nausea, unspecified vomiting type  Dehydration     ED Discharge Orders    None      Portions of this note were generated with dragon dictation software. Dictation errors may occur despite best attempts at proofreading.   Carrie Mew, MD 08/23/19 1734

## 2019-08-23 NOTE — H&P (Signed)
[facility] [place] [facility]   PATIENT NAME: Yvette [name]    MR#:  [id number]  DATE OF BIRTH:  Sep 25, 1980  DATE OF ADMISSION:  08/23/2019  PRIMARY CARE PHYSICIAN: Suan Halter, MD   REQUESTING/REFERRING PHYSICIAN: Dr Scotty Court  Patient coming from :Home   CHIEF COMPLAINT:  Intractable nausea vomiting since yesterday  HISTORY OF PRESENT ILLNESS:  Yvette [name]  is a 39 y.o. female with a known history of chronic leukopenia, chronic anemia follows at the cancer center with Dr. Donneta Romberg comes to the emergency room after she started having upper abdominal pain and multiple episodes of vomiting since likes night. Patient's say  during dinner time she thawed some fish that came frozen from the grocery store. She had left it on the counter for few hours before cooking it eight after that started having vomiting about 20 times after dinner. She is unable to keep anything orally.  ED course:  In the ER patient is currently dry heaving. She got Zofran and Phenergan. Receiving IV fluids. She was found to be clinically dehydrated and hence internal medicine was consulted for admission. Patient is afebrile pulses 71 blood pressure is 138/78 sats are hundred percent on room air. Denies any fever or diarrhea.  It is being admitted for intractable nausea vomiting suspected due to food poisoning. PAST MEDICAL HISTORY:   Past Medical History:  Diagnosis Date  . Asthma   . H/O Kawasaki's disease    as 54 month old child and at 8 years  . History of anemia   . History of blood transfusion 1984  . Leukopenia   . Neutropenia ([facility])     PAST SURGICAL HISTOIRY:  History reviewed. No pertinent surgical history.  SOCIAL HISTORY:   Social History   Tobacco Use  . Smoking status: Current Every Day Smoker    Packs/day: 0.20    Years: 15.00    Pack years: 3.00    Types: Cigarettes  . Smokeless tobacco: Never Used  Substance Use Topics  . Alcohol use: Yes   Alcohol/week: 61.0 standard drinks    Types: 21 Cans of beer, [name]    FAMILY HISTORY:   Family History  Problem Relation Age of Onset  . Lupus Mother   . Cancer Paternal Aunt        Breast   . Cancer Paternal Aunt        Breast     DRUG ALLERGIES:  No Known Allergies  REVIEW OF SYSTEMS:  Review of Systems  Constitutional: Negative for chills, fever and weight loss.  HENT: Negative for ear discharge, ear pain and nosebleeds.   Eyes: Negative for blurred vision, pain and discharge.  Respiratory: Negative for sputum production, shortness of breath, wheezing and stridor.   Cardiovascular: Negative for chest pain, palpitations, orthopnea and PND.  Gastrointestinal: Positive for abdominal pain, nausea and vomiting. Negative for diarrhea.  Genitourinary: Negative for frequency and urgency.  Musculoskeletal: Negative for back pain and joint pain.  Neurological: Positive for weakness. Negative for sensory change, speech change and focal weakness.  Psychiatric/Behavioral: Negative for depression and hallucinations. The patient is not nervous/anxious.      MEDICATIONS AT HOME:   Prior to Admission medications   Medication Sig Start Date End Date Taking? Authorizing Provider  ibuprofen (ADVIL) 200 MG tablet Take 200-400 mg by mouth every 6 (six) hours as needed for pain or fever.    Yes [provider]  mirtazapine (REMERON) 15 MG tablet Take 15 mg by mouth at bedtime.   Yes [provider]  omeprazole (PRILOSEC) 20 MG capsule Take 20 mg by mouth daily.   Yes [provider]      VITAL SIGNS:  Blood pressure 138/78, pulse (!) 58, temperature 97.9 F (36.6 C), temperature source Oral, resp. rate 18, height 5' (1.524 m), weight 47.6 kg, last menstrual period 08/09/2019, SpO2 100 %.  PHYSICAL EXAMINATION:  GENERAL:  39 y.o.-year-old patient lying in the bed with no acute distress.  EYES: Pupils equal,  round, reactive to light and accommodation. No scleral icterus.  HEENT: Head atraumatic, normocephalic. Oropharynx and nasopharynx clear.  NECK:  Supple, no jugular venous distention. No thyroid enlargement, no tenderness.  LUNGS: Normal breath sounds bilaterally, no wheezing, rales,rhonchi or crepitation. No use of accessory muscles of respiration.  CARDIOVASCULAR: S1, S2 normal. No murmurs, rubs, or gallops.  ABDOMEN: Soft, nontender, nondistended. Bowel sounds present. No organomegaly or mass.  EXTREMITIES: No pedal edema, cyanosis, or clubbing.  NEUROLOGIC: Cranial nerves II through XII are intact. Muscle strength 5/5 in all extremities. Sensation intact. Gait not checked.  PSYCHIATRIC: The patient is alert and oriented x 3.  SKIN: No obvious rash, lesion, or ulcer.   LABORATORY PANEL:   CBC Recent Labs  Lab 08/23/19 0950  WBC 9.6  HGB 13.9  HCT 41.2  PLT 166   ------------------------------------------------------------------------------------------------------------------  Chemistries  Recent Labs  Lab 08/23/19 0950  NA 137  K 3.7  CL 98  CO2 18*  GLUCOSE 119*  BUN 11  CREATININE 0.78  CALCIUM 10.1  AST 70*  ALT 48*  ALKPHOS 81  BILITOT 1.0   ------------------------------------------------------------------------------------------------------------------  Cardiac Enzymes No results for input(s): TROPONINI in the last 168 hours. ------------------------------------------------------------------------------------------------------------------  RADIOLOGY:  DG Chest 2 View  Result Date: 08/23/2019 CLINICAL DATA:  Chest pain EXAM: CHEST - 2 VIEW COMPARISON:  November 06, 2017. FINDINGS: The lungs are clear. The heart size and pulmonary vascularity are normal. No adenopathy. There is thoracolumbar levoscoliosis. No pneumothorax. IMPRESSION: Lungs clear.  Cardiac silhouette within normal limits. Electronically Signed   By: Lowella Grip III M.D.   On: 08/23/2019  09:49    EKG:    IMPRESSION AND PLAN:   Yvette [name]  is a 39 y.o. female with a known history of chronic leukopenia, chronic anemia follows at the cancer center with Dr. Rogue Bussing comes to the emergency room after she started having upper abdominal pain and multiple episodes of vomiting since likes night  1. Intractable nausea and vomiting suspected due to food poisoning from eating fish last night -for overnight observation -IV fluids -monitor input output -clear liquid -PRN Zofran and Phenergan  2. 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 She drank last night 16 ounce beer can. -No signs of withdrawal however will keep her on PRN Ativan as needed for symptoms of withdrawal -monitor LFTs -patient advised to abstain from excessive drinking  3. Metabolic acidosis with mild elevated anion gap due to intractable nausea vomiting with starvation acidosis -will give 1/2 normal saline with bicarb  4. Chronic leukopenia-- white count is stable -patient follows at the cancer center with Dr. Rogue Bussing  5. DVT prophylaxis subcu heparin   Family Communication : patient Consults : none Code Status : full DVT prophylaxis : heparin  TOTAL TIME TAKING CARE OF THIS PATIENT: *45* minutes.    Fritzi Mandes M.D  [facility]     CC: Primary care physician; Suan Halter, MD

## 2019-08-23 NOTE — ED Notes (Addendum)
ED TO INPATIENT HANDOFF REPORT  ED Nurse Name and Phone #:   Elijah Birk RN  [phone]  S Name/Age/Gender Yvette [name] 39 y.o. female Room/Bed: ED17A/ED17A  Code Status   Code Status: Not on file  Home/SNF/Other Home Patient oriented to: self, place, time and situation Is this baseline? Yes   Triage Complete: Triage complete  Chief Complaint Intractable vomiting with nausea [R11.2]  Triage Note Patient to ER for c/o vomiting (17 episodes) with blood seen in emesis (began at 10pm last night). Patient also reports feeling weak, "like I'm going to pass out". +Chest pain.     Allergies No Known Allergies  Level of Care/Admitting Diagnosis ED Disposition    ED Disposition Condition Comment   Admit  Hospital Area: [facility] [[id number]]  Level of Care: Med-Surg [16]  Covid Evaluation: Asymptomatic Screening Protocol (No Symptoms)  Diagnosis: Intractable vomiting with nausea [[id number]]  Admitting Physician: Joselyn Glassman  Attending Physician: Joselyn Glassman       B Medical/Surgery History Past Medical History:  Diagnosis Date  . Asthma   . H/O Kawasaki's disease    as 17 month old child and at 69 years  . History of anemia   . History of blood transfusion 1984  . Leukopenia   . Neutropenia (HCC)    History reviewed. No pertinent surgical history.   A IV Location/Drains/Wounds Patient Lines/Drains/Airways Status   Active Line/Drains/Airways    Name:   Placement date:   Placement time:   Site:   Days:   Peripheral IV 08/23/19 Left Hand   08/23/19    1006    Hand   less than 1          Intake/Output Last 24 hours  Intake/Output Summary (Last 24 hours) at 08/23/2019 2013 Last data filed at 08/23/2019 1937 Gross per 24 hour  Intake 2000 ml  Output --  Net 2000 ml    Labs/Imaging Results for orders placed or performed during the hospital encounter of 08/23/19 (from the past 48 hour(s))  Basic metabolic panel     Status: Abnormal    Collection Time: 08/23/19  9:50 AM  Result Value Ref Range   Sodium 137 135 - 145 mmol/L    Comment: LYTES REPEATED DAS   Potassium 3.7 3.5 - 5.1 mmol/L   Chloride 98 98 - 111 mmol/L   CO2 18 (L) 22 - 32 mmol/L   Glucose, Bld 119 (H) 70 - 99 mg/dL   BUN 11 6 - 20 mg/dL   Creatinine, Ser 3.66 0.44 - 1.00 mg/dL   Calcium 29.4 8.9 - 76.5 mg/dL   GFR calc non Af Amer >60 >60 mL/min   GFR calc Af Amer >60 >60 mL/min   Anion gap 21 (H) 5 - 15    Comment: Performed at [facility], [address] [place], [place] [address]  CBC     Status: None   Collection Time: 08/23/19  9:50 AM  Result Value Ref Range   WBC 9.6 4.0 - 10.5 K/uL   RBC 4.61 3.87 - 5.11 MIL/uL   Hemoglobin 13.9 12.0 - 15.0 g/dL   HCT 54.6 56.8 - 12.7 %   MCV 89.4 80.0 - 100.0 fL   MCH 30.2 26.0 - 34.0 pg   MCHC 33.7 30.0 - 36.0 g/dL   RDW 51.7 00.1 - 74.9 %   Platelets 166 150 - 400 K/uL   nRBC 0.0 0.0 - 0.2 %    Comment: Performed  at [facility], [place], [place] [address]  Troponin I (High Sensitivity)     Status: None   Collection Time: 08/23/19  9:50 AM  Result Value Ref Range   Troponin I (High Sensitivity) 3 <18 ng/L    Comment: (NOTE) Elevated high sensitivity troponin I (hsTnI) values and significant  changes across serial measurements may suggest ACS but many other  chronic and acute conditions are known to elevate hsTnI results.  Refer to the "Links" section for chest pain algorithms and additional  guidance. Performed at [facility], [address] [place], [place] [address]   Lipase, blood     Status: None   Collection Time: 08/23/19  9:50 AM  Result Value Ref Range   Lipase 18 11 - 51 U/L    Comment: Performed at [facility], [address] [place], [place] [address]  Hepatic function panel     Status: Abnormal   Collection Time: 08/23/19  9:50 AM  Result Value Ref Range   Total Protein 9.3 (H) 6.5 - 8.1 g/dL   Albumin 5.0 3.5 - 5.0  g/dL   AST 70 (H) 15 - 41 U/L   ALT 48 (H) 0 - 44 U/L   Alkaline Phosphatase 81 38 - 126 U/L   Total Bilirubin 1.0 0.3 - 1.2 mg/dL   Bilirubin, Direct 0.1 0.0 - 0.2 mg/dL   Indirect Bilirubin 0.9 0.3 - 0.9 mg/dL    Comment: Performed at [facility], [address] [place], [place] [address]  Magnesium     Status: None   Collection Time: 08/23/19  7:40 PM  Result Value Ref Range   Magnesium 1.9 1.7 - 2.4 mg/dL    Comment: Performed at [facility], [address] [place],  [address]   DG Chest 2 View  Result Date: 08/23/2019 CLINICAL DATA:  Chest pain EXAM: CHEST - 2 VIEW COMPARISON:  November 06, 2017. FINDINGS: The lungs are clear. The heart size and pulmonary vascularity are normal. No adenopathy. There is thoracolumbar levoscoliosis. No pneumothorax. IMPRESSION: Lungs clear.  Cardiac silhouette within normal limits. Electronically Signed   By: Lowella Grip III M.D.   On: 08/23/2019 09:49    Pending Labs Unresulted Labs (From admission, onward)    Start     Ordered   08/23/19 1758  SARS CORONAVIRUS 2 (TAT 6-24 HRS) Nasopharyngeal Nasopharyngeal Swab  (Tier 3 (TAT 6-24 hrs))  ONCE - STAT,   STAT    Question Answer Comment  Is this test for diagnosis or screening Screening   Symptomatic for COVID-19 as defined by CDC No   Hospitalized for COVID-19 No   Admitted to ICU for COVID-19 No   Previously tested for COVID-19 No   Resident in a congregate (group) care setting No   Employed in healthcare setting No   Pregnant No      08/23/19 1757   08/23/19 1009  Urinalysis, Complete w Microscopic  Once,   STAT     08/23/19 1008   Signed and Held  CBC  (heparin)  Once,   R    Comments: Baseline for heparin therapy IF NOT ALREADY DRAWN.  Notify MD if PLT < 100 K.    Signed and Held   Signed and Held  Creatinine, serum  (heparin)  Once,   R    Comments: Baseline for heparin therapy IF NOT ALREADY DRAWN.    Signed and Held   Signed and Held  Comprehensive metabolic panel  Tomorrow morning,   R     Signed and Held          Vitals/Pain Today's Vitals   08/23/19 1215 08/23/19 1300 08/23/19 1330 08/23/19 1415  BP: (!) 157/85 140/77 140/79 138/78  Pulse: 75 71 68 (!) 58  Resp: 15 17  18   Temp:      TempSrc:      SpO2: 100% 100% 98% 100%  Weight:      Height:      PainSc:        Isolation Precautions No active isolations  Medications Medications  haloperidol lactate (HALDOL) injection 2.5 mg (has no administration in time range)  diphenhydrAMINE (BENADRYL) injection 25 mg (has no administration in time range)  LORazepam (ATIVAN) injection 0.5 mg (has no administration in time range)  promethazine (PHENERGAN) tablet 12.5 mg (has no administration in time range)  sodium bicarbonate 100 mEq in dextrose 5 % 1,000 mL infusion (has no administration in time range)  ondansetron (ZOFRAN-ODT) disintegrating tablet 4 mg (4 mg Oral Given 08/23/19 0929)  diphenhydrAMINE (BENADRYL) injection 25 mg (25 mg Intravenous Given 08/23/19 1146)  sodium chloride 0.9 % bolus 1,000 mL (0 mLs Intravenous Stopped 08/23/19 1327)  ondansetron (ZOFRAN) injection 4 mg (4 mg Intravenous Given 08/23/19 1142)  dicyclomine (BENTYL) injection 20 mg (20 mg Intramuscular Given 08/23/19 1234)  promethazine (PHENERGAN) injection 12.5 mg (12.5 mg Intravenous Given 08/23/19 1229)  haloperidol lactate (HALDOL) injection 2.5 mg (2.5 mg Intravenous Given 08/23/19 1418)  sodium chloride 0.9 % bolus 1,000 mL (0 mLs Intravenous Stopped 08/23/19 1937)  pantoprazole (PROTONIX) injection 40 mg (40 mg Intravenous Given 08/23/19 1528)    Mobility walks Low fall risk   Focused Assessments Cardiac Assessment Handoff:    Lab Results  Component Value Date   TROPONINI <0.03 11/06/2017   No results found for: DDIMER Does the Patient currently have chest pain? No     R Recommendations: See Admitting Provider Note  Report given to:  11/08/2017 RN on 2C  Additional Notes:

## 2019-08-24 DIAGNOSIS — E860 Dehydration: Secondary | ICD-10-CM | POA: Diagnosis not present

## 2019-08-24 DIAGNOSIS — E876 Hypokalemia: Secondary | ICD-10-CM

## 2019-08-24 DIAGNOSIS — A059 Bacterial foodborne intoxication, unspecified: Secondary | ICD-10-CM | POA: Diagnosis not present

## 2019-08-24 DIAGNOSIS — R112 Nausea with vomiting, unspecified: Secondary | ICD-10-CM | POA: Diagnosis not present

## 2019-08-24 LAB — COMPREHENSIVE METABOLIC PANEL
ALT: 29 U/L (ref 0–44)
AST: 39 U/L (ref 15–41)
Albumin: 3.3 g/dL — ABNORMAL LOW (ref 3.5–5.0)
Alkaline Phosphatase: 52 U/L (ref 38–126)
Anion gap: 8 (ref 5–15)
BUN: 6 mg/dL (ref 6–20)
CO2: 27 mmol/L (ref 22–32)
Calcium: 8.7 mg/dL — ABNORMAL LOW (ref 8.9–10.3)
Chloride: 102 mmol/L (ref 98–111)
Creatinine, Ser: 0.67 mg/dL (ref 0.44–1.00)
GFR calc Af Amer: >60 mL/min (ref >60)
GFR calc non Af Amer: >60 mL/min (ref >60)
Glucose, Bld: 116 mg/dL — ABNORMAL HIGH (ref 70–99)
Potassium: 2.8 mmol/L — ABNORMAL LOW (ref 3.5–5.1)
Sodium: 137 mmol/L (ref 135–145)
Total Bilirubin: 0.9 mg/dL (ref 0.3–1.2)
Total Protein: 6.5 g/dL (ref 6.5–8.1)

## 2019-08-24 LAB — POTASSIUM: Potassium: 4.9 mmol/L (ref 3.5–5.1)

## 2019-08-24 MED ORDER — POTASSIUM CHLORIDE 20 MEQ PO PACK
40.0000 meq | PACK | ORAL | Status: AC
  Administered 2019-08-24 (×2): 40 meq via ORAL
  Filled 2019-08-24 (×2): qty 2

## 2019-08-24 MED ORDER — ONDANSETRON HCL 4 MG PO TABS: 4.0000 mg | ORAL_TABLET | Freq: Four times a day (QID) | ORAL | 0 refills | Status: DC | PRN

## 2019-08-24 MED ORDER — POTASSIUM CHLORIDE 10 MEQ/100ML IV SOLN: 10.0000 meq | INTRAVENOUS | Status: AC

## 2019-08-24 NOTE — Progress Notes (Signed)
   08/24/19 1400  Clinical Encounter Type  Visited With Patient  Visit Type Initial  Referral From Chaplain  Consult/Referral To Chaplain  Chaplain briefly visited with patient. Patient said she is feeling much better. Nurse came in and Chaplain left.

## 2019-08-24 NOTE — Discharge Summary (Signed)
[facility] - [place] [facility]   PATIENT NAME: Yvette [name]    MR#:  [id number]  DATE OF BIRTH:  08/11/1980  DATE OF ADMISSION:  08/23/2019 ADMITTING PHYSICIAN: Enedina Finner, MD  DATE OF DISCHARGE: 08/24/2019  PRIMARY CARE PHYSICIAN: Suan Halter, MD    ADMISSION DIAGNOSIS:  Dehydration [E86.0] Food poisoning [A05.9] Intractable vomiting with nausea [R11.2] Non-intractable vomiting with nausea, unspecified vomiting type [R11.2]  DISCHARGE DIAGNOSIS:  intractable nausea vomiting suspected due to food poisoning metabolic acidosis resolved hypokalemia repleted SECONDARY DIAGNOSIS:   Past Medical History:  Diagnosis Date  . Asthma   . 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 History of anemia   . History of blood transfusion 1984  . Leukopenia   . Neutropenia [facility]     HOSPITAL COURSE:   Brittanny Levenhagen  is a 39 y.o. female with a known history of chronic leukopenia, chronic anemia follows at the cancer center with Dr. Donneta Romberg comes to the emergency room after she started having upper abdominal pain and multiple episodes of vomiting since last night  1. Intractable nausea and vomiting suspected due to food poisoning from eating fish last night -no vomiting or dry heaves since admission. -Received IV fluids -tolerated clear liquid-- Stryffeler liquid -PRN Zofran   2. Elevated LFTs appears due to alcoholic hepatitis and vomiting -patient has history of drinking alcohol on daily basis. She drank last night 16 ounce beer can. -No signs of withdrawal however will keep her on PRN Ativan as needed for symptoms of withdrawal -monitor LFTs-- trending down -patient advised to abstain from excessive drinking  3. Metabolic acidosis with mild elevated anion gap due to intractable nausea vomiting with starvation acidosis -will give 1/2 normal saline with bicarb-- resolved -potassium repleted--4.9  4. Chronic leukopenia--  white count is stable -patient follows at the cancer center with Dr. Donneta Romberg  5. DVT prophylaxis subcu heparin  Hemodynamically stable. Will discharge patient to home with outpatient follow-up primary care and Dr. Donneta Romberg at the cancer center on her follow-up appointment patient is in agreement with the plan CONSULTS OBTAINED:    DRUG ALLERGIES:  No Known Allergies  DISCHARGE MEDICATIONS:   Allergies as of 08/24/2019   No Known Allergies     Medication List    STOP taking these medications   ibuprofen 200 MG tablet Commonly known as: ADVIL     TAKE these medications   mirtazapine 15 MG tablet Commonly known as: REMERON Take 15 mg by mouth at bedtime.   omeprazole 20 MG capsule Commonly known as: PRILOSEC Take 20 mg by mouth daily.   ondansetron 4 MG tablet Commonly known as: ZOFRAN Take 1 tablet (4 mg total) by mouth every 6 (six) hours as needed for nausea.       If you experience worsening of your admission symptoms, develop shortness of breath, life threatening emergency, suicidal or homicidal thoughts you must seek medical attention immediately by calling 911 or calling your MD immediately  if symptoms less severe.  You Must read complete instructions/literature along with all the possible adverse reactions/side effects for all the Medicines you take and that have been prescribed to you. Take any new Medicines after you have completely understood and accept all the possible adverse reactions/side effects.   Please note  You were cared for by a hospitalist during your hospital stay. If you have any questions about your discharge medications or the care you received  while you were in the hospital after you are discharged, you can call the unit and asked to speak with the hospitalist on call if the hospitalist that took care of you is not available. Once you are discharged, your primary care physician will handle any further medical issues. Please note that NO  REFILLS for any discharge medications will be authorized once you are discharged, as it is imperative that you return to your primary care physician (or establish a relationship with a primary care physician if you do not have one) for your aftercare needs so that they can reassess your need for medications and monitor your lab values. Today   SUBJECTIVE   No vomiting. Tolerating clear liquid. No abdominal pain  VITAL SIGNS:  Blood pressure 116/81, pulse 73, temperature 98.2 F (36.8 C), temperature source Axillary, resp. rate 18, height 5' (1.524 m), weight 47.6 kg, last menstrual period 08/09/2019, SpO2 100 %.  I/O:    Intake/Output Summary (Last 24 hours) at 08/24/2019 1316 Last data filed at 08/24/2019 1042 Gross per 24 hour  Intake 2600 ml  Output 650 ml  Net 1950 ml    PHYSICAL EXAMINATION:  GENERAL:  39 y.o.-year-old patient lying in the bed with no acute distress.  EYES: Pupils equal, round, reactive to light and accommodation. No scleral icterus.  HEENT: Head atraumatic, normocephalic. Oropharynx and nasopharynx clear.  NECK:  Supple, no jugular venous distention. No thyroid enlargement, no tenderness.  LUNGS: Normal breath sounds bilaterally, no wheezing, rales,rhonchi or crepitation. No use of accessory muscles of respiration.  CARDIOVASCULAR: S1, S2 normal. No murmurs, rubs, or gallops.  ABDOMEN: Soft, non-tender, non-distended. Bowel sounds present. No organomegaly or mass.  EXTREMITIES: No pedal edema, cyanosis, or clubbing.  NEUROLOGIC: Cranial nerves II through XII are intact. Muscle strength 5/5 in all extremities. Sensation intact. Gait not checked.  PSYCHIATRIC: The patient is alert and oriented x 3.  SKIN: No obvious rash, lesion, or ulcer.   DATA REVIEW:   CBC  Recent Labs  Lab 08/23/19 0950  WBC 9.6  HGB 13.9  HCT 41.2  PLT 166    Chemistries  Recent Labs  Lab 08/23/19 0950 08/23/19 1940 08/24/19 0619 08/24/19 0619 08/24/19 1220  NA   < >  --   137  --   --   K   < >  --  2.8*   < > 4.9  CL   < >  --  102  --   --   CO2   < >  --  27  --   --   GLUCOSE   < >  --  116*  --   --   BUN   < >  --  6  --   --   CREATININE   < >  --  0.67  --   --   CALCIUM   < >  --  8.7*  --   --   MG  --  1.9  --   --   --   AST   < >  --  39  --   --   ALT   < >  --  29  --   --   ALKPHOS   < >  --  52  --   --   BILITOT   < >  --  0.9  --   --    < > = values in this interval not displayed.  Microbiology Results   Recent Results (from the past 240 hour(s))  SARS CORONAVIRUS 2 (TAT 6-24 HRS) Nasopharyngeal Nasopharyngeal Swab     Status: None   Collection Time: 08/23/19  5:58 PM   Specimen: Nasopharyngeal Swab  Result Value Ref Range Status   SARS Coronavirus 2 NEGATIVE NEGATIVE Final    Comment: (NOTE) SARS-CoV-2 target nucleic acids are NOT DETECTED. The SARS-CoV-2 RNA is generally detectable in upper and lower respiratory specimens during the acute phase of infection. Negative results do not preclude SARS-CoV-2 infection, do not rule out co-infections with other pathogens, and should not be used as the sole basis for treatment or other patient management decisions. Negative results must be combined with clinical observations, patient history, and epidemiological information. The expected result is Negative. Fact Sheet for Patients: HairSlick.no Fact Sheet for Healthcare Providers: quierodirigir.com This test is not yet approved or cleared by the [place] FDA and  has been authorized for detection and/or diagnosis of SARS-CoV-2 by FDA under an Emergency Use Authorization (EUA). This EUA will remain  in effect (meaning this test can be used) for the duration of the COVID-19 declaration under Section 56 4(b)(1) of the Act, 21 U.S.C. section 360bbb-3(b)(1), unless the authorization is terminated or revoked sooner. Performed at [facility], [address],  [place], [place] [address]     RADIOLOGY:  DG Chest 2 View  Result Date: 08/23/2019 CLINICAL DATA:  Chest pain EXAM: CHEST - 2 VIEW COMPARISON:  November 06, 2017. FINDINGS: The lungs are clear. The heart size and pulmonary vascularity are normal. No adenopathy. There is thoracolumbar levoscoliosis. No pneumothorax. IMPRESSION: Lungs clear.  Cardiac silhouette within normal limits. Electronically Signed   By: Bretta Bang III M.D.   On: 08/23/2019 09:49     CODE STATUS:     Code Status Orders  (From admission, onward)         Start     Ordered   08/23/19 2200  Full code  Continuous     08/23/19 2159        Code Status History    This patient has a current code status but no historical code status.   Advance Care Planning Activity       TOTAL TIME TAKING CARE OF THIS PATIENT: *40* minutes.    Enedina Finner M.D  [facility]    CC: Primary care physician; Suan Halter, MD

## 2019-08-24 NOTE — Progress Notes (Signed)
Yvette [name] to be D/C'd Home per MD order.  Discussed prescriptions and follow up appointments with the patient. Prescriptions given to patient, medication list explained in detail. Pt verbalized understanding.  Allergies as of 08/24/2019   No Known Allergies     Medication List    STOP taking these medications   ibuprofen 200 MG tablet Commonly known as: ADVIL     TAKE these medications   mirtazapine 15 MG tablet Commonly known as: REMERON Take 15 mg by mouth at bedtime.   omeprazole 20 MG capsule Commonly known as: PRILOSEC Take 20 mg by mouth daily.   ondansetron 4 MG tablet Commonly known as: ZOFRAN Take 1 tablet (4 mg total) by mouth every 6 (six) hours as needed for nausea.       Vitals:   08/24/19 0555 08/24/19 0919  BP: (!) 102/51 116/81  Pulse: 94 73  Resp: 18 18  Temp: 98.5 F (36.9 C) 98.2 F (36.8 C)  SpO2: 99% 100%    Skin clean, dry and intact without evidence of skin break down, no evidence of skin tears noted. IV catheter discontinued intact. Site without signs and symptoms of complications. Dressing and pressure applied. Pt denies pain at this time. No complaints noted.  An After Visit Summary was printed and given to the patient. Patient escorted via WC, and D/C home via private auto.  Madie Reno, RN

## 2019-08-24 NOTE — Discharge Instructions (Signed)
Patient advised to take soft diet for 1 to 2 days and then advance as tolerated. Drink plenty of fluids

## 2019-08-24 NOTE — Consult Note (Signed)
PHARMACY CONSULT NOTE  Pharmacy Consult for Electrolyte Monitoring and Replacement   Recent Labs: Potassium (mmol/L)  Date Value  08/24/2019 2.8 (L)   Magnesium (mg/dL)  Date Value  61/60/7371 1.9   Calcium (mg/dL)  Date Value  01/05/9484 8.7 (L)   Albumin (g/dL)  Date Value  46/27/0350 3.3 (L)   Sodium (mmol/L)  Date Value  08/24/2019 137   Corrected Ca: 9.26 mg/dL  Assessment: 39 y.o. female with a known history of chronic leukopenia, chronic anemia follows at the cancer center with Dr. Donneta Romberg comes to the emergency room after she started having upper abdominal pain and multiple episodes of vomiting   Goal of Therapy:  Electrolytes WNL  Plan:   Potassium was replaced with a total of 80 mEq oral KCl  Follow-up potassium level 4.9 mmol/L following replacement  No further electrolyte supplementation required  Yvette [name] ,PharmD Clinical Pharmacist 08/24/2019 8:14 AM

## 2019-09-29 ENCOUNTER — Emergency Department
Admission: EM | Admit: 2019-09-29 | Discharge: 2019-09-29 | Disposition: A | Discharge status: Home / Self Care | Payer: Medicaid Other | Attending: Emergency Medicine | Admitting: Emergency Medicine

## 2019-09-29 ENCOUNTER — Other Ambulatory Visit: Payer: Self-pay

## 2019-09-29 ENCOUNTER — Emergency Department: Payer: Medicaid Other

## 2019-09-29 DIAGNOSIS — R7989 Other specified abnormal findings of blood chemistry: Secondary | ICD-10-CM | POA: Insufficient documentation

## 2019-09-29 DIAGNOSIS — Z79899 Other long term (current) drug therapy: Secondary | ICD-10-CM | POA: Diagnosis not present

## 2019-09-29 DIAGNOSIS — F17210 Nicotine dependence, cigarettes, uncomplicated: Secondary | ICD-10-CM | POA: Insufficient documentation

## 2019-09-29 DIAGNOSIS — J45909 Unspecified asthma, uncomplicated: Secondary | ICD-10-CM | POA: Diagnosis not present

## 2019-09-29 DIAGNOSIS — R1011 Right upper quadrant pain: Secondary | ICD-10-CM | POA: Insufficient documentation

## 2019-09-29 DIAGNOSIS — R110 Nausea: Secondary | ICD-10-CM | POA: Insufficient documentation

## 2019-09-29 LAB — COMPREHENSIVE METABOLIC PANEL
ALT: 121 U/L — ABNORMAL HIGH (ref 0–44)
AST: 422 U/L — ABNORMAL HIGH (ref 15–41)
Albumin: 4.8 g/dL (ref 3.5–5.0)
Alkaline Phosphatase: 88 U/L (ref 38–126)
Anion gap: 16 — ABNORMAL HIGH (ref 5–15)
BUN: 9 mg/dL (ref 6–20)
CO2: 18 mmol/L — ABNORMAL LOW (ref 22–32)
Calcium: 8.8 mg/dL — ABNORMAL LOW (ref 8.9–10.3)
Chloride: 110 mmol/L (ref 98–111)
Creatinine, Ser: 0.58 mg/dL (ref 0.44–1.00)
GFR calc Af Amer: >60 mL/min (ref >60)
GFR calc non Af Amer: >60 mL/min (ref >60)
Glucose, Bld: 148 mg/dL — ABNORMAL HIGH (ref 70–99)
Potassium: 4.1 mmol/L (ref 3.5–5.1)
Sodium: 144 mmol/L (ref 135–145)
Total Bilirubin: 0.5 mg/dL (ref 0.3–1.2)
Total Protein: 8.7 g/dL — ABNORMAL HIGH (ref 6.5–8.1)

## 2019-09-29 LAB — CBC
HCT: 41.2 % (ref 36.0–46.0)
Hemoglobin: 14 g/dL (ref 12.0–15.0)
MCH: 29.7 pg (ref 26.0–34.0)
MCHC: 34 g/dL (ref 30.0–36.0)
MCV: 87.5 fL (ref 80.0–100.0)
Platelets: 142 10*3/uL — ABNORMAL LOW (ref 150–400)
RBC: 4.71 MIL/uL (ref 3.87–5.11)
RDW: 13.4 % (ref 11.5–15.5)
WBC: 4.3 10*3/uL (ref 4.0–10.5)
nRBC: 0 % (ref 0.0–0.2)

## 2019-09-29 LAB — LIPASE, BLOOD: Lipase: 17 U/L (ref 11–51)

## 2019-09-29 LAB — TROPONIN I (HIGH SENSITIVITY): Troponin I (High Sensitivity): 4 ng/L (ref <18)

## 2019-09-29 MED ORDER — PROMETHAZINE HCL 25 MG/ML IJ SOLN
12.5000 mg | Freq: Once | INTRAMUSCULAR | Status: AC
  Administered 2019-09-29: 12.5 mg via INTRAVENOUS
  Filled 2019-09-29: qty 1

## 2019-09-29 MED ORDER — DROPERIDOL 2.5 MG/ML IJ SOLN
2.5000 mg | Freq: Once | INTRAMUSCULAR | Status: AC
  Administered 2019-09-29: 2.5 mg via INTRAVENOUS
  Filled 2019-09-29: qty 2

## 2019-09-29 MED ORDER — SODIUM CHLORIDE 0.9 % IV BOLUS
1000.0000 mL | Freq: Once | INTRAVENOUS | Status: AC
  Administered 2019-09-29: 1000 mL via INTRAVENOUS

## 2019-09-29 MED ORDER — PANTOPRAZOLE SODIUM 40 MG IV SOLR
40.0000 mg | Freq: Once | INTRAVENOUS | Status: AC
  Administered 2019-09-29: 40 mg via INTRAVENOUS
  Filled 2019-09-29: qty 40

## 2019-09-29 MED ORDER — PANTOPRAZOLE SODIUM 40 MG PO TBEC: 40.0000 mg | DELAYED_RELEASE_TABLET | Freq: Every day | ORAL | 1 refills | Status: DC

## 2019-09-29 MED ORDER — ONDANSETRON 4 MG PO TBDP: 4.0000 mg | ORAL_TABLET | Freq: Three times a day (TID) | ORAL | 0 refills | Status: DC | PRN

## 2019-09-29 MED ORDER — LORAZEPAM 2 MG/ML IJ SOLN
1.0000 mg | Freq: Once | INTRAMUSCULAR | Status: AC
  Administered 2019-09-29: 1 mg via INTRAVENOUS
  Filled 2019-09-29: qty 1

## 2019-09-29 MED ORDER — PROMETHAZINE HCL 25 MG PO TABS: 25.0000 mg | ORAL_TABLET | Freq: Four times a day (QID) | ORAL | 0 refills | Status: DC | PRN

## 2019-09-29 NOTE — ED Provider Notes (Signed)
[facility] Emergency Department Provider Note  ____________________________________________   First MD Initiated Contact with Patient 09/29/19 [phone]     (approximate)  I have reviewed the triage vital signs and the nursing notes.   HISTORY  Chief Complaint Nausea    HPI Yvette [name] is a 39 y.o. female with history of Kawasaki's who comes in for nausea.  Patient was actually admitted back on 2/12 for nausea and vomiting secondary to food poisoning.  Patient states that the nausea began around 10 PM.  Patient was given 4 mg of Zofran.  Patient states the Zofran did not help at all.  Her nausea is severe, constant, not better with Zofran, nothing makes it worse.  States that she did not eat anything different than normal.  States that she does drink daily but states she had a few beers today.  She denies any abdominal pain.  Denies any chest pain or shortness of breath.          Past Medical History:  Diagnosis Date  . Asthma   . H/O Kawasaki's disease    as 79 month old child and at 83 years  . History of anemia   . History of blood transfusion 1984  . Leukopenia   . Neutropenia [facility])     Patient Active Problem List   Diagnosis Date Noted  . Hypokalemia   . Intractable vomiting with nausea 08/23/2019  . Dehydration   . Food poisoning   . Other neutropenia ([place]) 06/11/2016  . Leukopenia 02/03/2015   No prior abdominal surgery  Prior to Admission medications   Medication Sig Start Date End Date Taking? Authorizing Provider  mirtazapine (REMERON) 15 MG tablet Take 15 mg by mouth at bedtime.    [provider]  omeprazole (PRILOSEC) 20 MG capsule Take 20 mg by mouth daily.    [provider]  ondansetron (ZOFRAN) 4 MG tablet Take 1 tablet (4 mg total) by mouth every 6 (six) hours as needed for nausea. 08/24/19   Fritzi Mandes, MD    Allergies Patient has no known allergies.  Family History  Problem Relation Age of Onset    . Lupus Mother   . Cancer Paternal Aunt        Breast   . Cancer Paternal Aunt        Breast     Social History Social History   Tobacco Use  . Smoking status: Current Every Day Smoker    Packs/day: 0.20    Years: 15.00    Pack years: 3.00    Types: Cigarettes  . Smokeless tobacco: Never Used  Substance Use Topics  . Alcohol use: Yes    Alcohol/week: 61.0 standard drinks    Types: 21 Cans of beer, [name]  . Drug use: No      Review of Systems Constitutional: No fever/chills Eyes: No visual changes. ENT: No sore throat. Cardiovascular: Denies chest pain. Respiratory: Denies shortness of breath. Gastrointestinal: No abdominal pain.  Positive nausea  genitourinary: Negative for dysuria. Musculoskeletal: Negative for back pain. Skin: Negative for rash. Neurological: Negative for headaches, focal weakness or numbness. All other ROS negative ____________________________________________   PHYSICAL EXAM:  VITAL SIGNS: ED Triage Vitals  Enc Vitals Group     BP 09/29/19 0418 (!) 125/103     Pulse Rate 09/29/19 0418 (!) 150     Resp 09/29/19 0418 (!) 22  Temp --      Temp Source 09/29/19 0418 Oral     SpO2 09/29/19 0418 100 %     Weight 09/29/19 0419 105 lb (47.6 kg)     Height 09/29/19 0419 5\' 1"  (1.549 m)     Head Circumference --      Peak Flow --      Pain Score 09/29/19 0418 0     Pain Loc --      Pain Edu? --      Excl. in [place]? --     Constitutional: Alert and oriented. Well appearing and in no acute distress. Eyes: Conjunctivae are normal. EOMI. Head: Atraumatic. Nose: No congestion/rhinnorhea. Mouth/Throat: Mucous membranes are moist.   Neck: No stridor. Trachea Midline. FROM Cardiovascular: tachycardia, regular rhythm. Grossly normal heart sounds.  Good peripheral circulation. Respiratory: Normal respiratory effort.  No retractions. Lungs CTAB. Gastrointestinal: Soft and nontender. No  distention. No abdominal bruits.  Musculoskeletal: No lower extremity tenderness nor edema.  No joint effusions. Neurologic:  Normal speech and language. No gross focal neurologic deficits are appreciated.  Skin:  Skin is warm, dry and intact. No rash noted. Psychiatric: Mood and affect are normal. Speech and behavior are normal. GU: Deferred   ____________________________________________   LABS (all labs ordered are listed, but only abnormal results are displayed)  Labs Reviewed  COMPREHENSIVE METABOLIC PANEL - Abnormal; Notable for the following components:      Result Value   CO2 18 (*)    Glucose, Bld 148 (*)    Calcium 8.8 (*)    Total Protein 8.7 (*)    AST 422 (*)    ALT 121 (*)    Anion gap 16 (*)    All other components within normal limits  CBC - Abnormal; Notable for the following components:   Platelets 142 (*)    All other components within normal limits  LIPASE, BLOOD  URINALYSIS, COMPLETE (UACMP) WITH MICROSCOPIC  POC URINE PREG, ED  POC URINE PREG, ED  TROPONIN I (HIGH SENSITIVITY)   ____________________________________________   ED ECG REPORT I, 10/01/19, the attending physician, personally viewed and interpreted this ECG.  EKG is sinus tachycardia rate of 143, no ST elevations, no T wave inversions, normal intervals ____________________________________________  RADIOLOGY Concha Se, personally viewed and evaluated these images (plain radiographs) as part of my medical decision making, as well as reviewing the written report by the radiologist.  ED MD interpretation: Pending  Official radiology report(s): No results found.  ____________________________________________   PROCEDURES  Procedure(s) performed (including Critical Care):  Procedures   ____________________________________________   INITIAL IMPRESSION / ASSESSMENT AND PLAN / ED COURSE  Yvette [name] was evaluated in Emergency Department on 09/29/2019 for the symptoms  described in the history of present illness. She was evaluated in the context of the global COVID-19 pandemic, which necessitated consideration that the patient might be at risk for infection with the SARS-CoV-2 virus that causes COVID-19. Institutional protocols and algorithms that pertain to the evaluation of patients at risk for COVID-19 are in a state of rapid change based on information released by regulatory bodies including the CDC and federal and state organizations. These policies and algorithms were followed during the patient's care in the ED.    Patient is a 39 year old who comes in with severe nausea and vomiting that started tonight around 10 PM.  States that she did drink but only had 1-2 beers.  Will get labs to evaluate for electrolyte  abnormalities, AKI, cholecystitis, pancreatitis.  Will get urine to evaluate for pregnancy, UTI.  Patient did not getting better with Zofran.  We will give a dose of IV Phenergan and some fluids and reassess patient  LFTs are slightly elevated most likely secondary to her EtOH but will add ultrasound to make sure no gallbladder pathology  6:14 AM reevaluated patient does not feel any better after the Phenergan.  We will give a dose of droperidol and see if that helps.  We will get repeat EKG to see if patient can be given more QTC prolonging medications after the droperidol.  Patient handed off to oncoming team pending reassessment and ultrasound for final disposition        ____________________________________________   FINAL CLINICAL IMPRESSION(S) / ED DIAGNOSES   Final diagnoses:  RUQ pain  Nausea  Elevated LFTs      MEDICATIONS GIVEN DURING THIS VISIT:  Medications  sodium chloride 0.9 % bolus 1,000 mL (has no administration in time range)  sodium chloride 0.9 % bolus 1,000 mL (1,000 mLs Intravenous New Bag/Given 09/29/19 0541)  promethazine (PHENERGAN) injection 12.5 mg (12.5 mg Intravenous Given 09/29/19 0549)  pantoprazole  (PROTONIX) injection 40 mg (40 mg Intravenous Given 09/29/19 0541)  droperidol (INAPSINE) 2.5 MG/ML injection 2.5 mg (2.5 mg Intravenous Given 09/29/19 0620)     ED Discharge Orders    None       Note:  This document was prepared using Dragon voice recognition software and may include unintentional dictation errors.   Concha Se, MD 09/29/19 [id number]

## 2019-09-29 NOTE — Discharge Instructions (Addendum)
Your Liver tests are elevated.  This could be secondary to alcohol use.  You should cut down on your alcohol use.  You should follow with your primary care doctor for further evaluation of this.

## 2019-09-29 NOTE — ED Notes (Signed)
Patient refuses to give urine sample at this time.

## 2019-09-29 NOTE — ED Notes (Signed)
Ultrasound at bedside

## 2019-09-29 NOTE — ED Provider Notes (Signed)
-----------------------------------------   8:18 AM on 09/29/2019 -----------------------------------------  Patient's lab work consistent mild dehydration, LFTs are elevated but reassuring ultrasound.  Patient does admit to daily alcohol use which could very likely be the cause of the LFT elevation.  I have reevaluated the patient, she continues to state nausea although has not vomited since medication administration.  Continues to states she feels unwell because of the nausea.  Patient is awake and alert.  We will dose 1 mg of Ativan to see if we can help with the patient's symptoms.  We will continue to closely monitor on telemetry.  Patient is EKG performed overnight shows a QTC of 422 which is reassuring as well.  Patient continues to state some nausea but has not vomited since medication administration.  Discussed with patient trial of Phenergan going home along with plenty of rest and supportive care including lots of fluids.  Patient is agreeable to plan.  We will also discharge patient on Protonix given the suspicion for gastritis.  I did discuss with the patient avoiding alcohol and NSAIDs.  Patient agreeable to plan.   Minna Antis, MD 09/29/19 1155

## 2019-09-29 NOTE — ED Triage Notes (Signed)
Pt with nausea that began since 2100 last night. Ems gave 4mg  zofran iv. Pt denies pain. 20g iv in right hand. Pt denies fever. Pt states last bowel movement at 2300.

## 2019-09-30 ENCOUNTER — Other Ambulatory Visit: Payer: Self-pay

## 2019-09-30 ENCOUNTER — Inpatient Hospital Stay
Admission: EM | Admit: 2019-09-30 | Discharge: 2019-10-02 | DRG: 392 | Disposition: A | Discharge status: Home / Self Care | Payer: Medicaid Other | Attending: Internal Medicine | Admitting: Internal Medicine

## 2019-09-30 DIAGNOSIS — R7989 Other specified abnormal findings of blood chemistry: Secondary | ICD-10-CM

## 2019-09-30 DIAGNOSIS — D72819 Decreased white blood cell count, unspecified: Secondary | ICD-10-CM | POA: Diagnosis present

## 2019-09-30 DIAGNOSIS — R112 Nausea with vomiting, unspecified: Principal | ICD-10-CM | POA: Diagnosis present

## 2019-09-30 DIAGNOSIS — Z7289 Other problems related to lifestyle: Secondary | ICD-10-CM

## 2019-09-30 DIAGNOSIS — Z79899 Other long term (current) drug therapy: Secondary | ICD-10-CM

## 2019-09-30 DIAGNOSIS — Z20822 Contact with and (suspected) exposure to covid-19: Secondary | ICD-10-CM | POA: Diagnosis present

## 2019-09-30 DIAGNOSIS — J45909 Unspecified asthma, uncomplicated: Secondary | ICD-10-CM | POA: Diagnosis present

## 2019-09-30 DIAGNOSIS — D6959 Other secondary thrombocytopenia: Secondary | ICD-10-CM | POA: Diagnosis present

## 2019-09-30 DIAGNOSIS — D696 Thrombocytopenia, unspecified: Secondary | ICD-10-CM

## 2019-09-30 DIAGNOSIS — F1090 Alcohol use, unspecified, uncomplicated: Secondary | ICD-10-CM

## 2019-09-30 DIAGNOSIS — R1110 Vomiting, unspecified: Secondary | ICD-10-CM

## 2019-09-30 DIAGNOSIS — F17210 Nicotine dependence, cigarettes, uncomplicated: Secondary | ICD-10-CM | POA: Diagnosis present

## 2019-09-30 DIAGNOSIS — Z789 Other specified health status: Secondary | ICD-10-CM

## 2019-09-30 LAB — CBC
HCT: 37.3 % (ref 36.0–46.0)
Hemoglobin: 12.4 g/dL (ref 12.0–15.0)
MCH: 30 pg (ref 26.0–34.0)
MCHC: 33.2 g/dL (ref 30.0–36.0)
MCV: 90.3 fL (ref 80.0–100.0)
Platelets: 117 10*3/uL — ABNORMAL LOW (ref 150–400)
RBC: 4.13 MIL/uL (ref 3.87–5.11)
RDW: 13.7 % (ref 11.5–15.5)
WBC: 2.7 10*3/uL — ABNORMAL LOW (ref 4.0–10.5)
nRBC: 0 % (ref 0.0–0.2)

## 2019-09-30 LAB — COMPREHENSIVE METABOLIC PANEL
ALT: 85 U/L — ABNORMAL HIGH (ref 0–44)
AST: 193 U/L — ABNORMAL HIGH (ref 15–41)
Albumin: 4.4 g/dL (ref 3.5–5.0)
Alkaline Phosphatase: 74 U/L (ref 38–126)
Anion gap: 14 (ref 5–15)
BUN: 6 mg/dL (ref 6–20)
CO2: 21 mmol/L — ABNORMAL LOW (ref 22–32)
Calcium: 9.3 mg/dL (ref 8.9–10.3)
Chloride: 106 mmol/L (ref 98–111)
Creatinine, Ser: 0.66 mg/dL (ref 0.44–1.00)
GFR calc Af Amer: >60 mL/min (ref >60)
GFR calc non Af Amer: >60 mL/min (ref >60)
Glucose, Bld: 108 mg/dL — ABNORMAL HIGH (ref 70–99)
Potassium: 3.9 mmol/L (ref 3.5–5.1)
Sodium: 141 mmol/L (ref 135–145)
Total Bilirubin: 0.9 mg/dL (ref 0.3–1.2)
Total Protein: 7.9 g/dL (ref 6.5–8.1)

## 2019-09-30 LAB — URINALYSIS, COMPLETE (UACMP) WITH MICROSCOPIC
Bilirubin Urine: NEGATIVE
Glucose, UA: NEGATIVE mg/dL
Ketones, ur: 20 mg/dL — AB
Leukocytes,Ua: NEGATIVE
Nitrite: NEGATIVE
Protein, ur: 100 mg/dL — AB
Specific Gravity, Urine: 1.026 (ref 1.005–1.030)
pH: 5 (ref 5.0–8.0)

## 2019-09-30 LAB — POCT PREGNANCY, URINE: Preg Test, Ur: NEGATIVE

## 2019-09-30 LAB — LIPASE, BLOOD: Lipase: 18 U/L (ref 11–51)

## 2019-09-30 MED ORDER — SODIUM CHLORIDE 0.9 % IV SOLN
1.0000 g | Freq: Once | INTRAVENOUS | Status: AC
  Administered 2019-09-30: 21:00:00 1 g via INTRAVENOUS
  Filled 2019-09-30: qty 10

## 2019-09-30 MED ORDER — PANTOPRAZOLE SODIUM 40 MG IV SOLR
40.0000 mg | INTRAVENOUS | Status: DC
  Administered 2019-10-01 (×2): 40 mg via INTRAVENOUS
  Filled 2019-09-30 (×2): qty 40

## 2019-09-30 MED ORDER — LORAZEPAM 2 MG/ML IJ SOLN
1.0000 mg | Freq: Once | INTRAMUSCULAR | Status: AC
  Administered 2019-09-30: 1 mg via INTRAVENOUS
  Filled 2019-09-30: qty 1

## 2019-09-30 MED ORDER — METOCLOPRAMIDE HCL 5 MG/ML IJ SOLN
10.0000 mg | Freq: Four times a day (QID) | INTRAMUSCULAR | Status: AC
  Administered 2019-10-01 (×2): 10 mg via INTRAVENOUS
  Filled 2019-09-30 (×2): qty 2

## 2019-09-30 MED ORDER — DROPERIDOL 2.5 MG/ML IJ SOLN
2.5000 mg | Freq: Once | INTRAMUSCULAR | Status: AC
  Administered 2019-09-30: 2.5 mg via INTRAVENOUS
  Filled 2019-09-30: qty 2

## 2019-09-30 MED ORDER — ONDANSETRON HCL 4 MG/2ML IJ SOLN: 4.0000 mg | Freq: Four times a day (QID) | INTRAMUSCULAR | Status: DC | PRN

## 2019-09-30 MED ORDER — FOSFOMYCIN TROMETHAMINE 3 G PO PACK: 3.0000 g | PACK | Freq: Once | ORAL | Status: DC

## 2019-09-30 MED ORDER — ONDANSETRON HCL 4 MG/2ML IJ SOLN
4.0000 mg | Freq: Once | INTRAMUSCULAR | Status: AC | PRN
  Administered 2019-09-30: 4 mg via INTRAVENOUS
  Filled 2019-09-30: qty 2

## 2019-09-30 MED ORDER — DEXTROSE-NACL 5-0.9 % IV SOLN: INTRAVENOUS | Status: DC

## 2019-09-30 MED ORDER — ONDANSETRON HCL 4 MG PO TABS: 4.0000 mg | ORAL_TABLET | Freq: Four times a day (QID) | ORAL | Status: DC | PRN

## 2019-09-30 MED ORDER — PROMETHAZINE HCL 25 MG/ML IJ SOLN: 12.5000 mg | Freq: Four times a day (QID) | INTRAMUSCULAR | Status: DC | PRN

## 2019-09-30 MED ORDER — ENOXAPARIN SODIUM 40 MG/0.4ML ~~LOC~~ SOLN
40.0000 mg | SUBCUTANEOUS | Status: DC
  Administered 2019-10-01 (×2): 40 mg via SUBCUTANEOUS
  Filled 2019-09-30 (×2): qty 0.4

## 2019-09-30 MED ORDER — SODIUM CHLORIDE 0.9 % IV BOLUS
1000.0000 mL | Freq: Once | INTRAVENOUS | Status: AC
  Administered 2019-09-30: 1000 mL via INTRAVENOUS

## 2019-09-30 NOTE — ED Notes (Signed)
Pt DC'd from IV to go to toilet, pt ambulatory without distress

## 2019-09-30 NOTE — ED Notes (Signed)
Pt vomited.   

## 2019-09-30 NOTE — ED Provider Notes (Signed)
[facility] Emergency Department Provider Note  Time seen: 5:28 PM  I have reviewed the triage vital signs and the nursing notes.   HISTORY  Chief Complaint Emesis   HPI Yvette [name] is a 39 y.o. female with a past medical history of hypokalemia, intractable nausea vomiting, presents to the emergency department for nausea and vomiting. Patient has been experiencing nausea vomiting over the past 3 days, unable to keep anything down per patient. She was seen in the emergency department yesterday, I took over care of the patient yesterday were ultimately able to control her symptoms enough to discharge her home. Patient states she was doing okay at home was able to eat some soup this morning however this afternoon attempted to drink water and began with vomiting once again. Patient states she tried taking her prescribed nausea medication but vomited shortly afterwards and it has not helped. Patient denies any significant abdominal pain but does state continued nausea and vomiting. Currently holding an emesis bag with approximately 200 cc of emesis.  Past Medical History:  Diagnosis Date  . Asthma   . 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 History of anemia   . History of blood transfusion 1984  . Leukopenia   . Neutropenia [facility])     Patient Active Problem List   Diagnosis Date Noted  . Hypokalemia   . Intractable vomiting with nausea 08/23/2019  . Dehydration   . Food poisoning   . Other neutropenia ([facility]) 06/11/2016  . Leukopenia 02/03/2015    History reviewed. No pertinent surgical history.  Prior to Admission medications   Medication Sig Start Date End Date Taking? Authorizing Provider  ondansetron (ZOFRAN ODT) 4 MG disintegrating tablet Take 1 tablet (4 mg total) by mouth every 8 (eight) hours as needed for nausea or vomiting. 09/29/19   Minna Antis, MD  ondansetron (ZOFRAN) 4 MG tablet Take 1 tablet (4 mg total) by  mouth every 6 (six) hours as needed for nausea. Patient not taking: Reported on 09/29/2019 08/24/19   Enedina Finner, MD  pantoprazole (PROTONIX) 40 MG tablet Take 1 tablet (40 mg total) by mouth daily. 09/29/19 09/28/20  Minna Antis, MD  promethazine (PHENERGAN) 25 MG tablet Take 1 tablet (25 mg total) by mouth every 6 (six) hours as needed for nausea or vomiting. 09/29/19   Minna Antis, MD    No Known Allergies  Family History  Problem Relation Age of Onset  . Lupus Mother   . Cancer Paternal Aunt        Breast   . Cancer Paternal Aunt        Breast     Social History Social History   Tobacco Use  . Smoking status: Current Every Day Smoker    Packs/day: 0.20    Years: 15.00    Pack years: 3.00    Types: Cigarettes  . Smokeless tobacco: Never Used  Substance Use Topics  . Alcohol use: Yes    Alcohol/week: 61.0 standard drinks    Types: 21 Cans of beer, [name]  . Drug use: No    Review of Systems Constitutional: Negative for fever. Cardiovascular: Negative for chest pain. Respiratory: Negative for shortness of breath. Gastrointestinal: Negative for abdominal pain. Positive for nausea vomiting. Musculoskeletal: Negative for musculoskeletal complaints Neurological: Negative for headache All other ROS negative  ____________________________________________   PHYSICAL EXAM:  VITAL  SIGNS: ED Triage Vitals [09/30/19 1507]  Enc Vitals Group     BP (!) 151/101     Pulse Rate 90     Resp 18     Temp 98.4 F (36.9 C)     Temp Source Oral     SpO2 99 %     Weight 105 lb (47.6 kg)     Height 5\' 1"  (1.549 m)     Head Circumference      Peak Flow      Pain Score 0     Pain Loc      Pain Edu?      Excl. in [place]?    Constitutional: Alert and oriented.  Patient does appear quite nauseated. Eyes: Normal exam ENT      Head: Normocephalic and atraumatic.      Mouth/Throat: Mucous membranes are  moist. Cardiovascular: Normal rate, regular rhythm. Respiratory: Normal respiratory effort without tachypnea nor retractions. Breath sounds are clear Gastrointestinal: Soft and nontender. No distention. Musculoskeletal: Nontender with normal range of motion in all extremities. Neurologic:  Normal speech and language. No gross focal neurologic deficits Skin:  Skin is warm, dry and intact.  Psychiatric: Mood and affect are normal.    INITIAL IMPRESSION / ASSESSMENT AND PLAN / ED COURSE  Pertinent labs & imaging results that were available during my care of the patient were reviewed by me and considered in my medical decision making (see chart for details).   Patient presents to the emergency department for continued nausea vomiting. Overall the patient appears well, no distress but does appear somewhat nauseated holding an emesis bag. Patient states of all the medications she received yesterday she believes the droperidol worked the best for her. We will repeat droperidol in the emergency department, continue with IV hydration. Patient agreeable to plan of care.  Labs largely unchanged from yesterday, urinalysis does show bacteria unable to keep down any oral antibiotics we will dose IV Rocephin and send urine culture.  Patient continues to be nauseated despite multiple antiemetics including Zofran, droperidol and Ativan.  Patient has vomited once again we will admit to the hospital service for intractable nausea vomiting.  Yvette [name] was evaluated in Emergency Department on 09/30/2019 for the symptoms described in the history of present illness. She was evaluated in the context of the global COVID-19 pandemic, which necessitated consideration that the patient might be at risk for infection with the SARS-CoV-2 virus that causes COVID-19. Institutional protocols and algorithms that pertain to the evaluation of patients at risk for COVID-19 are in a state of rapid change based on information  released by regulatory bodies including the CDC and federal and state organizations. These policies and algorithms were followed during the patient's care in the ED.  ____________________________________________   FINAL CLINICAL IMPRESSION(S) / ED DIAGNOSES  Nausea vomiting    Harvest Dark, MD 09/30/19 2120

## 2019-09-30 NOTE — ED Triage Notes (Addendum)
Pt arrives to ED for continued vomiting. Pt was seen last night and DC with prescription for nausea meds but unsure what it was. States "it's making it worse." pt coughing and dry heaving in triage. Denies diarrhea. Pt asking for a zofran shot.

## 2019-09-30 NOTE — H&P (Signed)
History and Physical    Yvette [name] Yvette [name] DOB: [birth year] DOA: 09/30/2019  PCP: Colleen Can, MD   Patient coming from:  home I have personally briefly reviewed patient's old medical records in [place]  Chief Complaint: Intractable nausea and vomiting  HPI: Yvette [name] is a 39 y.o. 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 Rogue Bussing who presents to the ER for the second time in 2 days with unrelenting nausea and vomiting.  Patient was monitored for several hours in the emergency room yesterday and improved and was discharged.  She tried eating a bowl of soup today and her symptoms started again.  She denies abdominal pain, fever chills and denies change in bowel habits.  Patient was hospitalized in February with similar symptoms at which time her symptoms were believed related to food poisoning. ED Course: Upon arrival in the emergency room blood pressure was 151/101 with otherwise normal vitals.  Blood work significant for normal lipase of 18, elevated AST/ALT of 193/85, also noted during hospitalization in February.  WBC was 2.7 in keeping with history of chronic leukopenia.  Platelet count 1 17,000.  Blood work otherwise unremarkable.  Given several antiemetics and IV hydration over several hours in the emergency room but continued to have intractable symptoms.  Hospitalist consulted for admission.  Review of Systems: As per HPI otherwise 10 point review of systems negative.    Past Medical History:  Diagnosis Date  . Asthma   . H/O Kawasaki's disease    as 10 month old child and at 67 years  . History of anemia   . History of blood transfusion 1984  . Leukopenia   . Neutropenia ([place])     History reviewed. No pertinent surgical history.   reports that she has been smoking cigarettes. She has a 3.00 pack-year smoking history. She has never used smokeless tobacco. She reports current  alcohol use of about 61.0 standard drinks of alcohol per week. She reports that she does not use drugs.  No Known Allergies  Family History  Problem Relation Age of Onset  . Lupus Mother   . Cancer Paternal Aunt        Breast   . Cancer Paternal Aunt        Breast      Prior to Admission medications   Medication Sig Start Date End Date Taking? Authorizing Provider  ondansetron (ZOFRAN ODT) 4 MG disintegrating tablet Take 1 tablet (4 mg total) by mouth every 8 (eight) hours as needed for nausea or vomiting. 09/29/19   Harvest Dark, MD  ondansetron (ZOFRAN) 4 MG tablet Take 1 tablet (4 mg total) by mouth every 6 (six) hours as needed for nausea. Patient not taking: Reported on 09/29/2019 08/24/19   Fritzi Mandes, MD  pantoprazole (PROTONIX) 40 MG tablet Take 1 tablet (40 mg total) by mouth daily. 09/29/19 09/28/20  Harvest Dark, MD  promethazine (PHENERGAN) 25 MG tablet Take 1 tablet (25 mg total) by mouth every 6 (six) hours as needed for nausea or vomiting. 09/29/19   Harvest Dark, MD    Physical Exam: Vitals:   09/30/19 1507 09/30/19 1754  BP: (!) 151/101 (!) 144/89  Pulse: 90 71  Resp: 18 14  Temp: 98.4 F (36.9 C)   TempSrc: Oral   SpO2: 99% 97%  Weight: 47.6 kg   Height: '5\' 1"'$  (1.549 m)      Vitals:   09/30/19 1507 09/30/19 1754  BP: (!) 151/101 (!) 144/89  Pulse: 90 71  Resp: 18 14  Temp: 98.4 F (36.9 C)   TempSrc: Oral   SpO2: 99% 97%  Weight: 47.6 kg   Height: '5\' 1"'$  (1.549 m)     Constitutional: Alert and awake, oriented x3, not in any acute distress. Eyes: PERLA, EOMI, irises appear normal, anicteric sclera,  ENMT: external ears and nose appear normal, normal hearing             Lips appears normal, oropharynx mucosa, tongue, posterior pharynx appear normal  Neck: neck appears normal, no masses, normal ROM, no thyromegaly, no JVD  CVS: S1-S2 clear, no murmur rubs or gallops,  , no carotid bruits, pedal pulses palpable, No LE  edema Respiratory:  clear to auscultation bilaterally, no wheezing, rales or rhonchi. Respiratory effort normal. No accessory muscle use.  Abdomen: soft nontender, nondistended, normal bowel sounds, no hepatosplenomegaly, no hernias Musculoskeletal: : no cyanosis, clubbing , no contractures or atrophy Neuro: Cranial nerves II-XII intact, sensation, reflexes normal, strength Psych: judgement and insight appear normal, stable mood and affect,  Skin: no rashes or lesions or ulcers, no induration or nodules   Labs on Admission: I have personally reviewed following labs and imaging studies  CBC: Recent Labs  Lab 09/29/19 0441 09/30/19 1522  WBC 4.3 2.7*  HGB 14.0 12.4  HCT 41.2 37.3  MCV 87.5 90.3  PLT 142* 829*   Basic Metabolic Panel: Recent Labs  Lab 09/29/19 0441 09/30/19 1522  NA 144 141  K 4.1 3.9  CL 110 106  CO2 18* 21*  GLUCOSE 148* 108*  BUN 9 6  CREATININE 0.58 0.66  CALCIUM 8.8* 9.3   GFR: Estimated Creatinine Clearance: 73.1 mL/min (by C-G formula based on SCr of 0.66 mg/dL). Liver Function Tests: Recent Labs  Lab 09/29/19 0441 09/30/19 1522  AST 422* 193*  ALT 121* 85*  ALKPHOS 88 74  BILITOT 0.5 0.9  PROT 8.7* 7.9  ALBUMIN 4.8 4.4   Recent Labs  Lab 09/29/19 0441 09/30/19 1522  LIPASE 17 18   No results for input(s): AMMONIA in the last 168 hours. Coagulation Profile: No results for input(s): INR, PROTIME in the last 168 hours. Cardiac Enzymes: No results for input(s): CKTOTAL, CKMB, CKMBINDEX, TROPONINI in the last 168 hours. BNP (last 3 results) No results for input(s): PROBNP in the last 8760 hours. HbA1C: No results for input(s): HGBA1C in the last 72 hours. CBG: No results for input(s): GLUCAP in the last 168 hours. Lipid Profile: No results for input(s): CHOL, HDL, LDLCALC, TRIG, CHOLHDL, LDLDIRECT in the last 72 hours. Thyroid Function Tests: No results for input(s): TSH, T4TOTAL, FREET4, T3FREE, THYROIDAB in the last 72  hours. Anemia Panel: No results for input(s): VITAMINB12, FOLATE, FERRITIN, TIBC, IRON, RETICCTPCT in the last 72 hours. Urine analysis:    Component Value Date/Time   COLORURINE AMBER (A) 09/30/2019 1753   APPEARANCEUR HAZY (A) 09/30/2019 1753   LABSPEC 1.026 09/30/2019 1753   PHURINE 5.0 09/30/2019 1753   GLUCOSEU NEGATIVE 09/30/2019 1753   HGBUR MODERATE (A) 09/30/2019 1753   BILIRUBINUR NEGATIVE 09/30/2019 1753   KETONESUR 20 (A) 09/30/2019 1753   PROTEINUR 100 (A) 09/30/2019 1753   NITRITE NEGATIVE 09/30/2019 1753   LEUKOCYTESUR NEGATIVE 09/30/2019 1753    Radiological Exams on Admission: US ABDOMEN LIMITED RUQ  Result Date: 09/29/2019 CLINICAL DATA:  Right upper quadrant pain. Additional history provided by scanning technologist: Patient reports right upper quadrant pain with nausea EXAM: ULTRASOUND ABDOMEN  LIMITED RIGHT UPPER QUADRANT COMPARISON:  CT abdomen/pelvis 04/24/2015, abdominal ultrasound 10/28/2014 FINDINGS: Gallbladder: No gallstones or wall thickening visualized. No sonographic Murphy sign noted by sonographer. Common bile duct: Diameter: 1-2 mm, within normal limits. Liver: No focal lesion identified. Generalized increased hepatic parenchymal echogenicity. Portal vein is patent on color Doppler imaging with normal direction of blood flow towards the liver. IMPRESSION: Generalized increased hepatic parenchymal echogenicity. This is a nonspecific finding which may be seen in the setting of hepatic steatosis or other chronic hepatic parenchymal disease. Otherwise unremarkable right upper quadrant ultrasound as described. Electronically Signed   By: Kellie Simmering DO   On: 09/29/2019 06:57      Assessment/Plan Principal Problem:   Intractable vomiting with nausea -Presented with repeated visits to the ER for intractable nausea and vomiting not responding to outpatient therapy --As needed Zofran with Phenergan for breakthrough -Reglan every 6 hours x24 -Protonix IV -IV  hydration -N.p.o. for complete bowel rest until in the a.m.    Alcohol use, daily   Elevated LFTs -For counseling on cutting back alcohol use -AST was 193 with ALT 85    Leukopenia -WBC 2.7.  -Continue outpatient follow-up with Dr. Rogue Bussing.  Patient has had bone marrow biopsy  Thrombocytopenia -Platelet count 117,000.  Possibly related to alcohol -Continue hematology follow-up  DVT prophylaxis: Lovenox  Code Status: full code  Family Communication:  none  Disposition Plan: Back to previous home environment Consults called: none  Status:obs    Athena Masse MD [facility]     09/30/2019, 9:54 PM

## 2019-09-30 NOTE — ED Notes (Addendum)
Pt requesting more nausea meds reports vomiting, emesis bag empty on examination, pt reports "dry heaving"   EDP messaged for more meds "the one I got last time I was here"  Pt refusing monitoring equipment

## 2019-09-30 NOTE — ED Notes (Signed)
Pt vomited small amount after medication given. Feels too nauseated to take oral abx at this time. Will wait longer before given so she does not throw them up.  Will informed dr Lenard Lance if nausea.

## 2019-10-01 ENCOUNTER — Inpatient Hospital Stay: Payer: Medicaid Other

## 2019-10-01 DIAGNOSIS — Z20822 Contact with and (suspected) exposure to covid-19: Secondary | ICD-10-CM | POA: Diagnosis present

## 2019-10-01 DIAGNOSIS — R112 Nausea with vomiting, unspecified: Secondary | ICD-10-CM | POA: Diagnosis present

## 2019-10-01 DIAGNOSIS — J45909 Unspecified asthma, uncomplicated: Secondary | ICD-10-CM | POA: Diagnosis present

## 2019-10-01 DIAGNOSIS — R1110 Vomiting, unspecified: Secondary | ICD-10-CM | POA: Diagnosis present

## 2019-10-01 DIAGNOSIS — D72819 Decreased white blood cell count, unspecified: Secondary | ICD-10-CM | POA: Diagnosis present

## 2019-10-01 DIAGNOSIS — F17210 Nicotine dependence, cigarettes, uncomplicated: Secondary | ICD-10-CM | POA: Diagnosis present

## 2019-10-01 DIAGNOSIS — Z79899 Other long term (current) drug therapy: Secondary | ICD-10-CM | POA: Diagnosis not present

## 2019-10-01 DIAGNOSIS — D6959 Other secondary thrombocytopenia: Secondary | ICD-10-CM | POA: Diagnosis present

## 2019-10-01 LAB — CBC
HCT: 32.8 % — ABNORMAL LOW (ref 36.0–46.0)
Hemoglobin: 10.9 g/dL — ABNORMAL LOW (ref 12.0–15.0)
MCH: 29.9 pg (ref 26.0–34.0)
MCHC: 33.2 g/dL (ref 30.0–36.0)
MCV: 89.9 fL (ref 80.0–100.0)
Platelets: 84 10*3/uL — ABNORMAL LOW (ref 150–400)
RBC: 3.65 MIL/uL — ABNORMAL LOW (ref 3.87–5.11)
RDW: 13.9 % (ref 11.5–15.5)
WBC: 2.4 10*3/uL — ABNORMAL LOW (ref 4.0–10.5)
nRBC: 0 % (ref 0.0–0.2)

## 2019-10-01 LAB — URINE DRUG SCREEN, QUALITATIVE (ARMC ONLY)
Amphetamines, Ur Screen: NOT DETECTED
Barbiturates, Ur Screen: NOT DETECTED
Benzodiazepine, Ur Scrn: POSITIVE — AB
Cannabinoid 50 Ng, Ur ~~LOC~~: NOT DETECTED
Cocaine Metabolite,Ur ~~LOC~~: NOT DETECTED
MDMA (Ecstasy)Ur Screen: NOT DETECTED
Methadone Scn, Ur: NOT DETECTED
Opiate, Ur Screen: NOT DETECTED
Phencyclidine (PCP) Ur S: NOT DETECTED
Tricyclic, Ur Screen: NOT DETECTED

## 2019-10-01 LAB — URINE CULTURE

## 2019-10-01 LAB — HIV ANTIBODY (ROUTINE TESTING W REFLEX): HIV Screen 4th Generation wRfx: NONREACTIVE

## 2019-10-01 LAB — COMPREHENSIVE METABOLIC PANEL
ALT: 57 U/L — ABNORMAL HIGH (ref 0–44)
AST: 81 U/L — ABNORMAL HIGH (ref 15–41)
Albumin: 3.4 g/dL — ABNORMAL LOW (ref 3.5–5.0)
Alkaline Phosphatase: 57 U/L (ref 38–126)
Anion gap: 12 (ref 5–15)
BUN: 10 mg/dL (ref 6–20)
CO2: 20 mmol/L — ABNORMAL LOW (ref 22–32)
Calcium: 8.6 mg/dL — ABNORMAL LOW (ref 8.9–10.3)
Chloride: 111 mmol/L (ref 98–111)
Creatinine, Ser: 0.79 mg/dL (ref 0.44–1.00)
GFR calc Af Amer: >60 mL/min (ref >60)
GFR calc non Af Amer: >60 mL/min (ref >60)
Glucose, Bld: 77 mg/dL (ref 70–99)
Potassium: 4.1 mmol/L (ref 3.5–5.1)
Sodium: 143 mmol/L (ref 135–145)
Total Bilirubin: 1.3 mg/dL — ABNORMAL HIGH (ref 0.3–1.2)
Total Protein: 6.4 g/dL — ABNORMAL LOW (ref 6.5–8.1)

## 2019-10-01 LAB — SARS CORONAVIRUS 2 (TAT 6-24 HRS): SARS Coronavirus 2: NEGATIVE

## 2019-10-01 MED ORDER — ACETAMINOPHEN 325 MG PO TABS
650.0000 mg | ORAL_TABLET | Freq: Once | ORAL | Status: AC
  Administered 2019-10-01: 650 mg via ORAL
  Filled 2019-10-01: qty 2

## 2019-10-01 MED ORDER — DIPHENHYDRAMINE HCL 25 MG PO CAPS
25.0000 mg | ORAL_CAPSULE | Freq: Four times a day (QID) | ORAL | Status: DC | PRN
  Administered 2019-10-01: 25 mg via ORAL
  Filled 2019-10-01: qty 1

## 2019-10-01 MED ORDER — DEXTROSE-NACL 5-0.45 % IV SOLN: INTRAVENOUS | Status: DC

## 2019-10-01 NOTE — ED Notes (Signed)
Pt given phone to call husband 

## 2019-10-01 NOTE — Progress Notes (Signed)
PROGRESS NOTE    Yvette [name]  GEZ:[id number] DOB: 08/02/1980 DOA: 09/30/2019 PCP: Colleen Can, MD    Brief Narrative:  Yvette [name] is a 39 y.o. 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 Rogue Bussing who presents to the ER for the second time in 2 days with intractable nausea and vomiting    Consultants:   none  Procedures:   Antimicrobials:   Rocephin x1   Subjective: Vomiting x1 this am. Nausous. No abd pain  Objective: Vitals:   10/01/19 0800 10/01/19 0900 10/01/19 0909 10/01/19 1125  BP: 126/77 130/85 130/74 135/75  Pulse: (!) 48 (!) 51 (!) 55 65  Resp: '16 16 16 17  '$ Temp:      TempSrc:      SpO2: 100% 98%  98%  Weight:      Height:        Intake/Output Summary (Last 24 hours) at 10/01/2019 1559 Last data filed at 10/01/2019 0034 Gross per 24 hour  Intake 1100 ml  Output --  Net 1100 ml   Filed Weights   09/30/19 1507 10/01/19 0718  Weight: 47.6 kg 47.6 kg    Examination:  General exam: Appears calm and comfortable , appears tired Respiratory system: Clear to auscultation. Respiratory effort normal. Cardiovascular system: S1 & S2 heard, RRR. No JVD, murmurs, rubs, gallops or clicks.  Gastrointestinal system: Abdomen is nondistended, soft and nontender. Normal bowel sounds heard. Central nervous system: Alert and oriented. No focal neurological deficits. Extremities: no edema. Skin: warm, dry Psychiatry: Judgement and insight appear normal. Mood & affect appropriate in current setting.     Data Reviewed: I have personally reviewed following labs and imaging studies  CBC: Recent Labs  Lab 09/29/19 0441 09/30/19 1522 10/01/19 0729  WBC 4.3 2.7* 2.4*  HGB 14.0 12.4 10.9*  HCT 41.2 37.3 32.8*  MCV 87.5 90.3 89.9  PLT 142* 117* 84*   Basic Metabolic Panel: Recent Labs  Lab 09/29/19 0441 09/30/19 1522 10/01/19 0729  NA 144 141 143  K 4.1 3.9 4.1  CL 110 106  111  CO2 18* 21* 20*  GLUCOSE 148* 108* 77  BUN '9 6 10  '$ CREATININE 0.58 0.66 0.79  CALCIUM 8.8* 9.3 8.6*   GFR: Estimated Creatinine Clearance: 73.1 mL/min (by C-G formula based on SCr of 0.79 mg/dL). Liver Function Tests: Recent Labs  Lab 09/29/19 0441 09/30/19 1522 10/01/19 0729  AST 422* 193* 81*  ALT 121* 85* 57*  ALKPHOS 88 74 57  BILITOT 0.5 0.9 1.3*  PROT 8.7* 7.9 6.4*  ALBUMIN 4.8 4.4 3.4*   Recent Labs  Lab 09/29/19 0441 09/30/19 1522  LIPASE 17 18   No results for input(s): AMMONIA in the last 168 hours. Coagulation Profile: No results for input(s): INR, PROTIME in the last 168 hours. Cardiac Enzymes: No results for input(s): CKTOTAL, CKMB, CKMBINDEX, TROPONINI in the last 168 hours. BNP (last 3 results) No results for input(s): PROBNP in the last 8760 hours. HbA1C: No results for input(s): HGBA1C in the last 72 hours. CBG: No results for input(s): GLUCAP in the last 168 hours. Lipid Profile: No results for input(s): CHOL, HDL, LDLCALC, TRIG, CHOLHDL, LDLDIRECT in the last 72 hours. Thyroid Function Tests: No results for input(s): TSH, T4TOTAL, FREET4, T3FREE, THYROIDAB in the last 72 hours. Anemia Panel: No results for input(s): VITAMINB12, FOLATE, FERRITIN, TIBC, IRON, RETICCTPCT in the last 72 hours. Sepsis Labs: No results for input(s): PROCALCITON, LATICACIDVEN in  the last 168 hours.  Recent Results (from the past 240 hour(s))  SARS CORONAVIRUS 2 (TAT 6-24 HRS) Nasopharyngeal Nasopharyngeal Swab     Status: None   Collection Time: 10/01/19 12:46 AM   Specimen: Nasopharyngeal Swab  Result Value Ref Range Status   SARS Coronavirus 2 NEGATIVE NEGATIVE Final    Comment: (NOTE) SARS-CoV-2 target nucleic acids are NOT DETECTED. The SARS-CoV-2 RNA is generally detectable in upper and lower respiratory specimens during the acute phase of infection. Negative results do not preclude SARS-CoV-2 infection, do not rule out co-infections with other  pathogens, and should not be used as the sole basis for treatment or other patient management decisions. Negative results must be combined with clinical observations, patient history, and epidemiological information. The expected result is Negative. Fact Sheet for Patients: SugarRoll.be Fact Sheet for Healthcare Providers: https://www.woods-mathews.com/ This test is not yet approved or cleared by the [place] FDA and  has been authorized for detection and/or diagnosis of SARS-CoV-2 by FDA under an Emergency Use Authorization (EUA). This EUA will remain  in effect (meaning this test can be used) for the duration of the COVID-19 declaration under Section 56 4(b)(1) of the Act, 21 U.S.C. section 360bbb-3(b)(1), unless the authorization is terminated or revoked sooner. Performed at [facility], [place] [address] [place], [place] [address]          Radiology Studies: No results found.      Scheduled Meds: . enoxaparin (LOVENOX) injection  40 mg Subcutaneous Q24H  . metoCLOPramide (REGLAN) injection  10 mg Intravenous Q6H  . pantoprazole (PROTONIX) IV  40 mg Intravenous Q24H   Continuous Infusions: . dextrose 5 % and 0.45% NaCl    . dextrose 5 % and 0.9% NaCl Stopped (10/01/19 0718)    Assessment & Plan:   Principal Problem:   Intractable vomiting with nausea Active Problems:   Leukopenia   Alcohol use, daily   Elevated LFTs   Thrombocytopenia ([facility])   Intractable vomiting   Intractable vomiting with nausea -Presented with repeated visits to the ER for intractable nausea and vomiting not responding to outpatient therapy --As needed Zofran with Phenergan for breakthrough -Reglan every 6 hours x24 -Protonix IV -start IV hydration -npo for now -ck urine pregnancy, if negative ck abd xr    Alcohol use, daily   Elevated LFTs -For counseling on cutting back alcohol use -AST was 193 with ALT 85..>>improving.     Leukopenia -WBC 2.7.  -Continue outpatient follow-up with Dr. Rogue Bussing.  Patient has had bone marrow biopsy  Thrombocytopenia -Platelet count 117,000.  Possibly related to alcohol -Continue hematology follow-up   DVT prophylaxis: lovenox Code Status:full code Family Communication: none at bedside Disposition Plan: back home Barrier: still vomiting, needs iv hydration, and w/u for intractable v/n sx.       LOS: 0 days   Time spent: 45 min with > 50% coc    Nolberto Hanlon, MD [facility] Pager [phone]  If 7PM-7AM, please contact night-coverage [email or web address] Password [facility], [place] 10/01/2019, 3:59 PM

## 2019-10-01 NOTE — ED Notes (Signed)
Pt comfortable in bed watching TV and watching phone.

## 2019-10-01 NOTE — ED Notes (Signed)
pt informed to call mom

## 2019-10-01 NOTE — ED Notes (Signed)
Pt informed to call mom again. Pt given phone to call mom. Pt states her phone isnt working

## 2019-10-02 NOTE — Discharge Summary (Addendum)
Yvette [name] WNU:[id number] DOB: 30-Apr-1981 DOA: 09/30/2019  PCP: Suan Halter, MD  Admit date: 09/30/2019 Discharge date: 10/02/2019  Admitted From: Home Disposition: Home  Recommendations for Outpatient Follow-up:  1. Follow up with PCP in 1 week 2. Please obtain BMP/CBC in one week     Discharge Condition:Stable CODE STATUS:full Diet recommendation: regular Brief/Interim Summary: Yvette [name] is a 39 y.o. 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 Donneta Romberg who presents to the ER for the second time in 2 days with unrelenting nausea and vomiting.  Patient was monitored for several hours in the emergency room initially  and improved and was discharged.  She tried eating a bowl of soup today and her symptoms started again.  She denies abdominal pain, fever chills and denies change in bowel habits.  Patient was hospitalized in February with similar symptoms at which time her symptoms were believed related to food poisoning. Her LFT's were elevated.  They improved with IV hydration and keep her n.p.o.  Ultrasound of the abdomen revealed Generalized increased hepatic parenchymal echogenicity. This is a nonspecific finding which may be seen in the setting of hepatic steatosis or other chronic hepatic parenchymal disease.  Started on IV Protonix, her diet was increased as tolerated.  She had solids today and has no symptoms.  Abdominal x-ray was obtained with results below essentially only showing nonspecific gas pattern.  She has thrombocytopenia related to her alcohol.  She was extensively counseled on stopping her heavy drinking.  Discharge Diagnoses:  Principal Problem:   Intractable vomiting with nausea Active Problems:   Leukopenia   Alcohol use, daily   Elevated LFTs   Thrombocytopenia (HCC)   Intractable vomiting    Discharge Instructions  Discharge Instructions    Call MD for:  temperature >100.4    Complete by: As directed    Diet - low sodium heart healthy   Complete by: As directed    Increase activity slowly   Complete by: As directed      Allergies as of 10/02/2019   No Known Allergies     Medication List    STOP taking these medications   promethazine 25 MG tablet Commonly known as: PHENERGAN     TAKE these medications   ondansetron 4 MG disintegrating tablet Commonly known as: Zofran ODT Take 1 tablet (4 mg total) by mouth every 8 (eight) hours as needed for nausea or vomiting.   pantoprazole 40 MG tablet Commonly known as: Protonix Take 1 tablet (40 mg total) by mouth daily.       No Known Allergies  Consultations: none  Procedures/Studies: DG Abd 1 View  Result Date: 10/01/2019 CLINICAL DATA:  Chronic alcohol abuse, leukopenia, nausea and vomiting EXAM: ABDOMEN - 1 VIEW COMPARISON:  09/29/2019 FINDINGS: Supine frontal view of the abdomen and pelvis demonstrates an unremarkable bowel gas pattern. No masses or abnormal calcifications. No acute bony abnormalities. IMPRESSION: 1. Unremarkable bowel gas pattern. Electronically Signed   By: Sharlet Salina M.D.   On: 10/01/2019 16:30   US ABDOMEN LIMITED RUQ  Result Date: 09/29/2019 CLINICAL DATA:  Right upper quadrant pain. Additional history provided by scanning technologist: Patient reports right upper quadrant pain with nausea EXAM: ULTRASOUND ABDOMEN LIMITED RIGHT UPPER QUADRANT COMPARISON:  CT abdomen/pelvis 04/24/2015, abdominal ultrasound 10/28/2014 FINDINGS: Gallbladder: No gallstones or wall thickening visualized. No sonographic Murphy sign noted by sonographer. Common bile duct: Diameter: 1-2 mm, within normal limits. Liver: No focal lesion  identified. Generalized increased hepatic parenchymal echogenicity. Portal vein is patent on color Doppler imaging with normal direction of blood flow towards the liver. IMPRESSION: Generalized increased hepatic parenchymal echogenicity. This is a nonspecific finding  which may be seen in the setting of hepatic steatosis or other chronic hepatic parenchymal disease. Otherwise unremarkable right upper quadrant ultrasound as described. Electronically Signed   By: Jackey Loge DO   On: 09/29/2019 06:57      Subjective: Feeling better.  No nausea vomiting or abdominal pain  Discharge Exam: Vitals:   10/01/19 2054 10/02/19 0436  BP: (!) 143/90 139/89  Pulse: (!) 50 (!) 49  Resp: 16 18  Temp: 98 F (36.7 C) 99 F (37.2 C)  SpO2: 100% 100%   Vitals:   10/01/19 1653 10/01/19 1711 10/01/19 2054 10/02/19 0436  BP: 130/74 (!) 148/80 (!) 143/90 139/89  Pulse: 60 (!) 49 (!) 50 (!) 49  Resp: 17  16 18   Temp: 98.4 F (36.9 C) 98.6 F (37 C) 98 F (36.7 C) 99 F (37.2 C)  TempSrc: Oral Oral Oral Oral  SpO2: 98% 100% 100% 100%  Weight:      Height:        General: Pt is alert, awake, not in acute distress Cardiovascular: RRR, S1/S2 +, no rubs, no gallops Respiratory: CTA bilaterally, no wheezing, no rhonchi Abdominal: Soft, NT, ND, bowel sounds + Extremities: no edema, no cyanosis    The results of significant diagnostics from this hospitalization (including imaging, microbiology, ancillary and laboratory) are listed below for reference.     Microbiology: Recent Results (from the past 240 hour(s))  Urine Culture     Status: Abnormal   Collection Time: 09/30/19  6:17 PM   Specimen: Urine, Random  Result Value Ref Range Status   Specimen Description   Final    URINE, RANDOM Performed at [facility], [address], [place], [place]    Special Requests   Final    NONE Performed at [facility], [address] [place], [place]    Culture MULTIPLE SPECIES PRESENT, SUGGEST RECOLLECTION (A)  Final   Report Status 10/01/2019 FINAL  Final  SARS CORONAVIRUS 2 (TAT 6-24 HRS) Nasopharyngeal Nasopharyngeal Swab     Status: None   Collection Time: 10/01/19 12:46 AM   Specimen: Nasopharyngeal Swab  Result  Value Ref Range Status   SARS Coronavirus 2 NEGATIVE NEGATIVE Final    Comment: (NOTE) SARS-CoV-2 target nucleic acids are NOT DETECTED. The SARS-CoV-2 RNA is generally detectable in upper and lower respiratory specimens during the acute phase of infection. Negative results do not preclude SARS-CoV-2 infection, do not rule out co-infections with other pathogens, and should not be used as the sole basis for treatment or other patient management decisions. Negative results must be combined with clinical observations, patient history, and epidemiological information. The expected result is Negative. Fact Sheet for Patients: 10/03/19 Fact Sheet for Healthcare Providers: HairSlick.no This test is not yet approved or cleared by the [place] FDA and  has been authorized for detection and/or diagnosis of SARS-CoV-2 by FDA under an Emergency Use Authorization (EUA). This EUA will remain  in effect (meaning this test can be used) for the duration of the COVID-19 declaration under Section 56 4(b)(1) of the Act, 21 U.S.C. section 360bbb-3(b)(1), unless the authorization is terminated or revoked sooner. Performed at [facility], [address], [place], [place]      Labs: BNP (last 3  results) No results for input(s): BNP in the last 8760 hours. Basic Metabolic Panel: Recent Labs  Lab 09/29/19 0441 09/30/19 1522 10/01/19 0729  NA 144 141 143  K 4.1 3.9 4.1  CL 110 106 111  CO2 18* 21* 20*  GLUCOSE 148* 108* 77  BUN 9 6 10   CREATININE 0.58 0.66 0.79  CALCIUM 8.8* 9.3 8.6*   Liver Function Tests: Recent Labs  Lab 09/29/19 0441 09/30/19 1522 10/01/19 0729  AST 422* 193* 81*  ALT 121* 85* 57*  ALKPHOS 88 74 57  BILITOT 0.5 0.9 1.3*  PROT 8.7* 7.9 6.4*  ALBUMIN 4.8 4.4 3.4*   Recent Labs  Lab 09/29/19 0441 09/30/19 1522  LIPASE 17 18   No results for input(s): AMMONIA in the last 168  hours. CBC: Recent Labs  Lab 09/29/19 0441 09/30/19 1522 10/01/19 0729  WBC 4.3 2.7* 2.4*  HGB 14.0 12.4 10.9*  HCT 41.2 37.3 32.8*  MCV 87.5 90.3 89.9  PLT 142* 117* 84*   Cardiac Enzymes: No results for input(s): CKTOTAL, CKMB, CKMBINDEX, TROPONINI in the last 168 hours. BNP: Invalid input(s): POCBNP CBG: No results for input(s): GLUCAP in the last 168 hours. D-Dimer No results for input(s): DDIMER in the last 72 hours. Hgb A1c No results for input(s): HGBA1C in the last 72 hours. Lipid Profile No results for input(s): CHOL, HDL, LDLCALC, TRIG, CHOLHDL, LDLDIRECT in the last 72 hours. Thyroid function studies No results for input(s): TSH, T4TOTAL, T3FREE, THYROIDAB in the last 72 hours.  Invalid input(s): FREET3 Anemia work up No results for input(s): VITAMINB12, FOLATE, FERRITIN, TIBC, IRON, RETICCTPCT in the last 72 hours. Urinalysis    Component Value Date/Time   COLORURINE AMBER (A) 09/30/2019 1753   APPEARANCEUR HAZY (A) 09/30/2019 1753   LABSPEC 1.026 09/30/2019 1753   PHURINE 5.0 09/30/2019 1753   GLUCOSEU NEGATIVE 09/30/2019 1753   HGBUR MODERATE (A) 09/30/2019 1753   BILIRUBINUR NEGATIVE 09/30/2019 1753   KETONESUR 20 (A) 09/30/2019 1753   PROTEINUR 100 (A) 09/30/2019 1753   NITRITE NEGATIVE 09/30/2019 1753   LEUKOCYTESUR NEGATIVE 09/30/2019 1753   Sepsis Labs Invalid input(s): PROCALCITONIN,  WBC,  LACTICIDVEN Microbiology Recent Results (from the past 240 hour(s))  Urine Culture     Status: Abnormal   Collection Time: 09/30/19  6:17 PM   Specimen: Urine, Random  Result Value Ref Range Status   Specimen Description   Final    URINE, RANDOM Performed at [facility], [address] [place], [place]    Special Requests   Final    NONE Performed at [facility], [address] [place], [place]    Culture MULTIPLE SPECIES PRESENT, SUGGEST RECOLLECTION (A)  Final   Report Status 10/01/2019 FINAL  Final   SARS CORONAVIRUS 2 (TAT 6-24 HRS) Nasopharyngeal Nasopharyngeal Swab     Status: None   Collection Time: 10/01/19 12:46 AM   Specimen: Nasopharyngeal Swab  Result Value Ref Range Status   SARS Coronavirus 2 NEGATIVE NEGATIVE Final    Comment: (NOTE) SARS-CoV-2 target nucleic acids are NOT DETECTED. The SARS-CoV-2 RNA is generally detectable in upper and lower respiratory specimens during the acute phase of infection. Negative results do not preclude SARS-CoV-2 infection, do not rule out co-infections with other pathogens, and should not be used as the sole basis for treatment or other patient management decisions. Negative results must be combined with clinical observations, patient history, and epidemiological information. The expected result is Negative. Fact Sheet for  Patients: SugarRoll.be Fact Sheet for Healthcare Providers: https://www.woods-mathews.com/ This test is not yet approved or cleared by the [place] FDA and  has been authorized for detection and/or diagnosis of SARS-CoV-2 by FDA under an Emergency Use Authorization (EUA). This EUA will remain  in effect (meaning this test can be used) for the duration of the COVID-19 declaration under Section 56 4(b)(1) of the Act, 21 U.S.C. section 360bbb-3(b)(1), unless the authorization is terminated or revoked sooner. Performed at [facility], [place] [address], [place], [place] [address]      Time coordinating discharge: Over 30 minutes  SIGNED:   Nolberto Hanlon, MD  [facility] 10/02/2019, 12:41 PM Pager   If 7PM-7AM, please contact night-coverage [email or web address] Password [email or web address]

## 2019-10-02 NOTE — Progress Notes (Signed)
Yvette [name]  A and O x 4. VSS. Pt tolerating diet well. No complaints of pain or nausea. IV removed intact, prescriptions given. Pt voiced understanding of discharge instructions with no further questions. Pt discharged home.   Allergies as of 10/02/2019   No Known Allergies     Medication List    STOP taking these medications   promethazine 25 MG tablet Commonly known as: PHENERGAN     TAKE these medications   ondansetron 4 MG disintegrating tablet Commonly known as: Zofran ODT Take 1 tablet (4 mg total) by mouth every 8 (eight) hours as needed for nausea or vomiting.   pantoprazole 40 MG tablet Commonly known as: Protonix Take 1 tablet (40 mg total) by mouth daily.       Vitals:   10/01/19 2054 10/02/19 0436  BP: (!) 143/90 139/89  Pulse: (!) 50 (!) 49  Resp: 16 18  Temp: 98 F (36.7 C) 99 F (37.2 C)  SpO2: 100% 100%    Suzzanne Cloud

## 2019-10-08 ENCOUNTER — Inpatient Hospital Stay: Payer: Medicaid Other | Admitting: Internal Medicine

## 2019-11-06 ENCOUNTER — Ambulatory Visit: Payer: Medicaid Other | Admitting: Internal Medicine

## 2019-11-07 ENCOUNTER — Telehealth (INDEPENDENT_AMBULATORY_CARE_PROVIDER_SITE_OTHER): Payer: Medicaid Other | Admitting: Gastroenterology

## 2019-11-07 ENCOUNTER — Other Ambulatory Visit: Payer: Self-pay

## 2019-11-07 ENCOUNTER — Encounter: Payer: Self-pay | Admitting: Gastroenterology

## 2019-11-07 DIAGNOSIS — R7989 Other specified abnormal findings of blood chemistry: Secondary | ICD-10-CM | POA: Diagnosis not present

## 2019-11-07 DIAGNOSIS — K219 Gastro-esophageal reflux disease without esophagitis: Secondary | ICD-10-CM | POA: Diagnosis not present

## 2019-11-07 NOTE — Progress Notes (Signed)
Yvette [name] [address]  [address]  [place], [place] [address]  Main: [phone]  Fax: [phone]   Gastroenterology Consultation  Referring Provider:     Toy Cookey, FNP Primary Care Physician:  Toy Cookey, FNP Reason for Consultation:   GERD        HPI:   Virtual Visit via Telephone Note  I connected with patient on 11/07/19 at  1:30 PM EDT by telephone and verified that I am speaking with the correct person using two identifiers.   I discussed the limitations, risks, security and privacy concerns of performing an evaluation and management service by telephone and the availability of in person appointments. I also discussed with the patient that there may be a patient responsible charge related to this service. The patient expressed understanding and agreed to proceed.  Location of the patient: Home Location of provider: Home Participating persons: Patient and provider only   History of Present Illness: Chief Complaint  Patient presents with  . New Patient (Initial Visit)  . Gastroesophageal Reflux    Patient stated that she does not have nausea, vomiting, RUQ pain, burning in her esophagus or chest pain     Yvette [name] is a 39 y.o. y/o female referred for consultation & management  by Dr. Toy Cookey, FNP.  Patient recently admitted with nausea vomiting and found to have chronically elevated liver enzymes and chronically low platelets with ultrasound showing increased hepatic parenchymal echogenicity in the city.  Patient reported epigastric pain and was started on PPI and since then symptoms have resolved.  She reports when she was pregnant in 2005 she had hematemesis and underwent an upper endoscopy which showed ulcers.  Symptoms have not reoccurred since then. no prior colonoscopy.  Denies any dysphagia.  Reports alcohol use daily, both hard liquor and beer.  No prior GI work-up.  Past Medical History:  Diagnosis Date  . Asthma   .  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 History of anemia   . History of blood transfusion 1984  . Leukopenia   . Neutropenia (HCC)     History reviewed. No pertinent surgical history.  Prior to Admission medications   Medication Sig Start Date End Date Taking? Authorizing Provider  ondansetron (ZOFRAN ODT) 4 MG disintegrating tablet Take 1 tablet (4 mg total) by mouth every 8 (eight) hours as needed for nausea or vomiting. 09/29/19  Yes Minna Antis, MD  pantoprazole (PROTONIX) 40 MG tablet Take 1 tablet (40 mg total) by mouth daily. 09/29/19 09/28/20 Yes Minna Antis, MD    Family History  Problem Relation Age of Onset  . Lupus Mother   . Cancer Paternal Aunt        Breast   . Cancer Paternal Aunt        Breast      Social History   Tobacco Use  . Smoking status: Current Every Day Smoker    Packs/day: 0.20    Years: 15.00    Pack years: 3.00    Types: Cigarettes  . Smokeless tobacco: Never Used  Substance Use Topics  . Alcohol use: Yes    Alcohol/week: 61.0 standard drinks    Types: 21 Cans of beer, [name]  . Drug use: No    Allergies as of 11/07/2019  . (No Known Allergies)    Review of Systems:  All systems reviewed and negative except where noted in HPI.   Observations/Objective:  Labs: CBC    Component Value Date/Time   WBC 2.4 (L) 10/01/2019 0729   RBC 3.65 (L) 10/01/2019 0729   HGB 10.9 (L) 10/01/2019 0729   HCT 32.8 (L) 10/01/2019 0729   PLT 84 (L) 10/01/2019 0729   MCV 89.9 10/01/2019 0729   MCH 29.9 10/01/2019 0729   MCHC 33.2 10/01/2019 0729   RDW 13.9 10/01/2019 0729   LYMPHSABS 0.5 (L) 08/20/2019 1429   MONOABS 0.5 08/20/2019 1429   EOSABS 0.0 08/20/2019 1429   BASOSABS 0.0 08/20/2019 1429   CMP     Component Value Date/Time   NA 143 10/01/2019 0729   K 4.1 10/01/2019 0729   CL 111 10/01/2019 0729   CO2 20 (L) 10/01/2019 0729   GLUCOSE 77  10/01/2019 0729   BUN 10 10/01/2019 0729   CREATININE 0.79 10/01/2019 0729   CALCIUM 8.6 (L) 10/01/2019 0729   PROT 6.4 (L) 10/01/2019 0729   ALBUMIN 3.4 (L) 10/01/2019 0729   AST 81 (H) 10/01/2019 0729   ALT 57 (H) 10/01/2019 0729   ALKPHOS 57 10/01/2019 0729   BILITOT 1.3 (H) 10/01/2019 0729   GFRNONAA >60 10/01/2019 0729   GFRAA >60 10/01/2019 0729    Imaging Studies: No results found.  Assessment and Plan:   Yvette [name] is a 39 y.o. y/o female has been referred for GERD  Assessment and Plan: GERD symptoms are now well controlled with PPI daily (Risks of PPI use were discussed with patient including bone loss, C. Diff diarrhea, pneumonia, infections, CKD, electrolyte abnormalities.  Pt. Verbalizes understanding and chooses to continue the medication.)  I do not have her procedure report from 2005 but I suspect she may have had esophagitis, small ulcers or Mallory-Weiss tear at that time.  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 The patient was provided an opportunity to ask questions and all were answered. The patient agreed with the plan and demonstrated an understanding of the instructions.   The patient was advised to call back or seek an in-person evaluation if the symptoms worsen or if the condition fails to improve as anticipated.  I provided 15 minutes of non-face-to-face time during this encounter.   Virgel Manifold, MD  Speech recognition software was used to dictate the above note.

## 2019-11-08 ENCOUNTER — Other Ambulatory Visit: Payer: Self-pay

## 2019-11-08 DIAGNOSIS — F1010 Alcohol abuse, uncomplicated: Secondary | ICD-10-CM | POA: Diagnosis present

## 2019-11-08 DIAGNOSIS — Z20822 Contact with and (suspected) exposure to covid-19: Secondary | ICD-10-CM | POA: Diagnosis present

## 2019-11-08 DIAGNOSIS — E876 Hypokalemia: Secondary | ICD-10-CM | POA: Diagnosis not present

## 2019-11-08 DIAGNOSIS — R7401 Elevation of levels of liver transaminase levels: Secondary | ICD-10-CM | POA: Diagnosis present

## 2019-11-08 DIAGNOSIS — F17210 Nicotine dependence, cigarettes, uncomplicated: Secondary | ICD-10-CM | POA: Diagnosis present

## 2019-11-08 DIAGNOSIS — R030 Elevated blood-pressure reading, without diagnosis of hypertension: Secondary | ICD-10-CM | POA: Diagnosis present

## 2019-11-08 DIAGNOSIS — R197 Diarrhea, unspecified: Secondary | ICD-10-CM | POA: Diagnosis present

## 2019-11-08 DIAGNOSIS — Z832 Family history of diseases of the blood and blood-forming organs and certain disorders involving the immune mechanism: Secondary | ICD-10-CM

## 2019-11-08 DIAGNOSIS — J45909 Unspecified asthma, uncomplicated: Secondary | ICD-10-CM | POA: Diagnosis present

## 2019-11-08 DIAGNOSIS — K2920 Alcoholic gastritis without bleeding: Principal | ICD-10-CM | POA: Diagnosis present

## 2019-11-08 NOTE — ED Triage Notes (Signed)
Pt presents w/ vomiting x 4 hrs. Pt is pale and vomiting prior to and during triage. Pt is retching and dry heaving at this time.

## 2019-11-09 ENCOUNTER — Inpatient Hospital Stay
Admission: EM | Admit: 2019-11-09 | Discharge: 2019-11-13 | DRG: 392 | Disposition: A | Discharge status: Home / Self Care | Payer: Medicaid Other | Attending: Hospitalist | Admitting: Hospitalist

## 2019-11-09 ENCOUNTER — Encounter: Payer: Self-pay | Admitting: *Deleted

## 2019-11-09 ENCOUNTER — Other Ambulatory Visit: Payer: Self-pay

## 2019-11-09 DIAGNOSIS — Z7289 Other problems related to lifestyle: Secondary | ICD-10-CM | POA: Diagnosis not present

## 2019-11-09 DIAGNOSIS — R7989 Other specified abnormal findings of blood chemistry: Secondary | ICD-10-CM | POA: Diagnosis not present

## 2019-11-09 DIAGNOSIS — R112 Nausea with vomiting, unspecified: Principal | ICD-10-CM

## 2019-11-09 DIAGNOSIS — Z789 Other specified health status: Secondary | ICD-10-CM

## 2019-11-09 DIAGNOSIS — K2920 Alcoholic gastritis without bleeding: Secondary | ICD-10-CM | POA: Diagnosis not present

## 2019-11-09 LAB — COMPREHENSIVE METABOLIC PANEL
ALT: 82 U/L — ABNORMAL HIGH (ref 0–44)
AST: 212 U/L — ABNORMAL HIGH (ref 15–41)
Albumin: 4.9 g/dL (ref 3.5–5.0)
Alkaline Phosphatase: 86 U/L (ref 38–126)
Anion gap: 19 — ABNORMAL HIGH (ref 5–15)
BUN: 9 mg/dL (ref 6–20)
CO2: 18 mmol/L — ABNORMAL LOW (ref 22–32)
Calcium: 9.9 mg/dL (ref 8.9–10.3)
Chloride: 102 mmol/L (ref 98–111)
Creatinine, Ser: 0.59 mg/dL (ref 0.44–1.00)
GFR calc Af Amer: >60 mL/min (ref >60)
GFR calc non Af Amer: >60 mL/min (ref >60)
Glucose, Bld: 109 mg/dL — ABNORMAL HIGH (ref 70–99)
Potassium: 4.3 mmol/L (ref 3.5–5.1)
Sodium: 139 mmol/L (ref 135–145)
Total Bilirubin: 1.3 mg/dL — ABNORMAL HIGH (ref 0.3–1.2)
Total Protein: 8.8 g/dL — ABNORMAL HIGH (ref 6.5–8.1)

## 2019-11-09 LAB — URINALYSIS, COMPLETE (UACMP) WITH MICROSCOPIC
Bilirubin Urine: NEGATIVE
Glucose, UA: NEGATIVE mg/dL
Ketones, ur: 80 mg/dL — AB
Leukocytes,Ua: NEGATIVE
Nitrite: NEGATIVE
Protein, ur: 100 mg/dL — AB
Specific Gravity, Urine: 1.02 (ref 1.005–1.030)
pH: 6 (ref 5.0–8.0)

## 2019-11-09 LAB — CBC
HCT: 37.3 % (ref 36.0–46.0)
Hemoglobin: 12.8 g/dL (ref 12.0–15.0)
MCH: 30.2 pg (ref 26.0–34.0)
MCHC: 34.3 g/dL (ref 30.0–36.0)
MCV: 88 fL (ref 80.0–100.0)
Platelets: 148 10*3/uL — ABNORMAL LOW (ref 150–400)
RBC: 4.24 MIL/uL (ref 3.87–5.11)
RDW: 14.6 % (ref 11.5–15.5)
WBC: 10.8 10*3/uL — ABNORMAL HIGH (ref 4.0–10.5)
nRBC: 0 % (ref 0.0–0.2)

## 2019-11-09 LAB — MAGNESIUM: Magnesium: 1.4 mg/dL — ABNORMAL LOW (ref 1.7–2.4)

## 2019-11-09 LAB — POCT PREGNANCY, URINE: Preg Test, Ur: NEGATIVE

## 2019-11-09 LAB — LIPASE, BLOOD: Lipase: 19 U/L (ref 11–51)

## 2019-11-09 LAB — RESPIRATORY PANEL BY RT PCR (FLU A&B, COVID)
Influenza A by PCR: NEGATIVE
Influenza B by PCR: NEGATIVE
SARS Coronavirus 2 by RT PCR: NEGATIVE

## 2019-11-09 LAB — PHOSPHORUS: Phosphorus: 4 mg/dL (ref 2.5–4.6)

## 2019-11-09 MED ORDER — PANTOPRAZOLE SODIUM 40 MG IV SOLR
40.0000 mg | INTRAVENOUS | Status: DC
  Administered 2019-11-09: 11:00:00 40 mg via INTRAVENOUS
  Filled 2019-11-09: qty 40

## 2019-11-09 MED ORDER — ENOXAPARIN SODIUM 40 MG/0.4ML ~~LOC~~ SOLN
40.0000 mg | SUBCUTANEOUS | Status: DC
  Administered 2019-11-09 – 2019-11-12 (×4): 40 mg via SUBCUTANEOUS
  Filled 2019-11-09 (×4): qty 0.4

## 2019-11-09 MED ORDER — LORAZEPAM 2 MG/ML IJ SOLN
1.0000 mg | INTRAMUSCULAR | Status: AC | PRN
  Administered 2019-11-11: 1 mg via INTRAVENOUS
  Filled 2019-11-09: qty 1

## 2019-11-09 MED ORDER — SODIUM CHLORIDE 0.9% FLUSH: 3.0000 mL | Freq: Once | INTRAVENOUS | Status: DC

## 2019-11-09 MED ORDER — METOCLOPRAMIDE HCL 5 MG/ML IJ SOLN
10.0000 mg | Freq: Four times a day (QID) | INTRAMUSCULAR | Status: DC
  Administered 2019-11-09 – 2019-11-11 (×7): 10 mg via INTRAVENOUS
  Filled 2019-11-09 (×8): qty 2

## 2019-11-09 MED ORDER — PROMETHAZINE HCL 25 MG/ML IJ SOLN
12.5000 mg | Freq: Once | INTRAMUSCULAR | Status: AC
  Administered 2019-11-09: 12.5 mg via INTRAVENOUS
  Filled 2019-11-09: qty 1

## 2019-11-09 MED ORDER — HYDRALAZINE HCL 20 MG/ML IJ SOLN
10.0000 mg | Freq: Four times a day (QID) | INTRAMUSCULAR | Status: DC | PRN
  Administered 2019-11-09: 10 mg via INTRAVENOUS
  Filled 2019-11-09: qty 1

## 2019-11-09 MED ORDER — FLUOXETINE HCL 20 MG PO CAPS
40.0000 mg | ORAL_CAPSULE | Freq: Every day | ORAL | Status: DC
  Administered 2019-11-11 – 2019-11-12 (×2): 40 mg via ORAL
  Filled 2019-11-09 (×6): qty 2

## 2019-11-09 MED ORDER — THIAMINE HCL 100 MG/ML IJ SOLN
INTRAVENOUS | Status: DC
  Filled 2019-11-09 (×6): qty 1000

## 2019-11-09 MED ORDER — LORAZEPAM 1 MG PO TABS
1.0000 mg | ORAL_TABLET | ORAL | Status: AC | PRN
  Administered 2019-11-12: 1 mg via ORAL
  Filled 2019-11-09: qty 1

## 2019-11-09 MED ORDER — SODIUM CHLORIDE 0.9 % IV BOLUS
1000.0000 mL | Freq: Once | INTRAVENOUS | Status: AC
  Administered 2019-11-09: 03:00:00 1000 mL via INTRAVENOUS

## 2019-11-09 MED ORDER — ONDANSETRON HCL 4 MG/2ML IJ SOLN: 4.0000 mg | Freq: Once | INTRAMUSCULAR | Status: DC

## 2019-11-09 MED ORDER — LORAZEPAM 2 MG/ML IJ SOLN: 0.0000 mg | Freq: Two times a day (BID) | INTRAMUSCULAR | Status: DC

## 2019-11-09 MED ORDER — METOCLOPRAMIDE HCL 5 MG/ML IJ SOLN
10.0000 mg | Freq: Once | INTRAMUSCULAR | Status: AC
  Administered 2019-11-09: 05:00:00 10 mg via INTRAVENOUS
  Filled 2019-11-09: qty 2

## 2019-11-09 MED ORDER — PROMETHAZINE HCL 25 MG/ML IJ SOLN
12.5000 mg | Freq: Once | INTRAMUSCULAR | Status: AC
  Administered 2019-11-09: 01:00:00 12.5 mg via INTRAVENOUS

## 2019-11-09 MED ORDER — SODIUM CHLORIDE 0.9 % IV BOLUS
1000.0000 mL | Freq: Once | INTRAVENOUS | Status: AC
  Administered 2019-11-09: 01:00:00 1000 mL via INTRAVENOUS

## 2019-11-09 MED ORDER — SODIUM CHLORIDE 0.9 % IV BOLUS
1000.0000 mL | Freq: Once | INTRAVENOUS | Status: AC
  Administered 2019-11-09: 1000 mL via INTRAVENOUS

## 2019-11-09 MED ORDER — LORAZEPAM 2 MG/ML IJ SOLN
0.0000 mg | Freq: Four times a day (QID) | INTRAMUSCULAR | Status: DC
  Administered 2019-11-09: 2 mg via INTRAVENOUS
  Administered 2019-11-10: 15:00:00 1 mg via INTRAVENOUS
  Filled 2019-11-09 (×2): qty 1

## 2019-11-09 NOTE — ED Notes (Signed)
Pt still co nausea, med given,.

## 2019-11-09 NOTE — ED Notes (Signed)
EDP aware of pt's continuing nausea.

## 2019-11-09 NOTE — ED Notes (Signed)
Pt c/o nausea, emesis x1. Pt states "I want some medication (for nausea), give me the strongest one". Will inform EDP.

## 2019-11-09 NOTE — ED Notes (Signed)
Pt states she feels like she needs admission, still has slight nausea. States "last time I got discharged him I got sicker".

## 2019-11-09 NOTE — ED Provider Notes (Signed)
[facility] Emergency Department Provider Note  ____________________________________________   First MD Initiated Contact with Patient 11/09/19 0016     (approximate)  I have reviewed the triage vital signs and the nursing notes.   HISTORY  Chief Complaint Emesis   HPI Yvette [name] is a 39 y.o. female with below list of previous medical conditions including alcohol abuse with previous admission for intractable nausea and vomiting on 09/30/2019 presents to the emergency department secondary to nausea and vomiting which began approximately 6 PM this evening that has been persistent since onset.  Patient denies any abdominal pain.  Patient denies any fever no diarrhea constipation.  Patient does admit to consuming "a lot of alcohol yesterday".        Past Medical History:  Diagnosis Date  . Asthma   . 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 History of anemia   . History of blood transfusion 1984  . Leukopenia   . Neutropenia [facility])     Patient Active Problem List   Diagnosis Date Noted  . Intractable vomiting 10/01/2019  . Alcohol use, daily 09/30/2019  . Elevated LFTs 09/30/2019  . Thrombocytopenia ([place]) 09/30/2019  . Hypokalemia   . Intractable vomiting with nausea 08/23/2019  . Dehydration   . Food poisoning   . Other neutropenia ([place]) 06/11/2016  . Leukopenia 02/03/2015    History reviewed. No pertinent surgical history.  Prior to Admission medications   Medication Sig Start Date End Date Taking? Authorizing Provider  FLUoxetine (PROZAC) 40 MG capsule Take 40 mg by mouth daily.   Yes [provider]  ondansetron (ZOFRAN ODT) 4 MG disintegrating tablet Take 1 tablet (4 mg total) by mouth every 8 (eight) hours as needed for nausea or vomiting. Patient not taking: Reported on 11/09/2019 09/29/19   Harvest Dark, MD  pantoprazole (PROTONIX) 40 MG tablet Take 1 tablet (40 mg total) by mouth  daily. Patient not taking: Reported on 11/09/2019 09/29/19 09/28/20  Harvest Dark, MD    Allergies Patient has no known allergies.  Family History  Problem Relation Age of Onset  . Lupus Mother   . Cancer Paternal Aunt        Breast   . Cancer Paternal Aunt        Breast     Social History Social History   Tobacco Use  . Smoking status: Current Every Day Smoker    Packs/day: 0.20    Years: 15.00    Pack years: 3.00    Types: Cigarettes  . Smokeless tobacco: Never Used  Substance Use Topics  . Alcohol use: Yes    Alcohol/week: 61.0 standard drinks    Types: 21 Cans of beer, [name]  . Drug use: No    Review of Systems Constitutional: No fever/chills Eyes: No visual changes. ENT: No sore throat. Cardiovascular: Denies chest pain. Respiratory: Denies shortness of breath. Gastrointestinal: No abdominal pain.  No nausea, positive for vomiting.  No diarrhea.  No constipation. Genitourinary: Negative for dysuria. Musculoskeletal: Negative for neck pain.  Negative for back pain. Integumentary: Negative for rash. Neurological: Negative for headaches, focal weakness or numbness.   ____________________________________________   PHYSICAL EXAM:  VITAL SIGNS: ED Triage Vitals  Enc Vitals Group     BP 11/08/19 2357 (!) 146/102     Pulse Rate 11/08/19 2357 91     Resp 11/08/19 2357 (!)  24     Temp 11/08/19 2357 97.7 F (36.5 C)     Temp Source 11/08/19 2357 Oral     SpO2 11/08/19 2357 99 %     Weight 11/08/19 2358 45.8 kg (101 lb)     Height 11/08/19 2358 1.549 m (5\' 1" )     Head Circumference --      Peak Flow --      Pain Score 11/08/19 2358 10     Pain Loc --      Pain Edu? --    Constitutional: Alert and oriented.  Actively vomiting Eyes: Conjunctivae are normal.  Mouth/Throat: Patient is wearing a mask. Neck: No stridor.  No meningeal signs.   Cardiovascular: Normal rate, regular rhythm. Good  peripheral circulation. Grossly normal heart sounds. Respiratory: Normal respiratory effort.  No retractions. Gastrointestinal: Soft and nontender. No distention.  Musculoskeletal: No lower extremity tenderness nor edema. No gross deformities of extremities. Neurologic:  Normal speech and language. No gross focal neurologic deficits are appreciated.  Skin:  Skin is warm, dry and intact. Psychiatric: Mood and affect are normal. Speech and behavior are normal.  ____________________________________________   LABS (all labs ordered are listed, but only abnormal results are displayed)  Labs Reviewed  COMPREHENSIVE METABOLIC PANEL - Abnormal; Notable for the following components:      Result Value   CO2 18 (*)    Glucose, Bld 109 (*)    Total Protein 8.8 (*)    AST 212 (*)    ALT 82 (*)    Total Bilirubin 1.3 (*)    Anion gap 19 (*)    All other components within normal limits  CBC - Abnormal; Notable for the following components:   WBC 10.8 (*)    Platelets 148 (*)    All other components within normal limits  URINALYSIS, COMPLETE (UACMP) WITH MICROSCOPIC - Abnormal; Notable for the following components:   Color, Urine YELLOW (*)    APPearance CLEAR (*)    Hgb urine dipstick SMALL (*)    Ketones, ur 80 (*)    Protein, ur 100 (*)    Bacteria, UA MANY (*)    All other components within normal limits  LIPASE, BLOOD  POC URINE PREG, ED  POCT PREGNANCY, URINE   ____________________________________________  EKG ED ECG REPORT I, [place] N Keeghan Bialy, the attending physician, personally viewed and interpreted this ECG.   Date: 11/09/2019  EKG Time: 12:48 AM  Rate: 99  Rhythm: Normal sinus rhythm  Axis: Normal  Intervals: Normal  ST&T Change: None      Procedures   ____________________________________________   INITIAL IMPRESSION / MDM / ASSESSMENT AND PLAN / ED COURSE  As part of my medical decision making, I reviewed the following data within the electronic medical  record:   39 year old female presented with above-stated history and physical exam consistent with previous episodes of intractable nausea and vomiting.  Patient has no abdominal pain reported or on clinical exam.  Patient received multiple doses of Phenergan and subsequently Reglan but admits to continued nausea and vomiting.  Laboratory data urinalysis revealed 80 ketones.  Patient received 3 L of IV normal saline.  Given intractable nausea and vomiting plan to admit the patient.      ____________________________________________  FINAL CLINICAL IMPRESSION(S) / ED DIAGNOSES  Final diagnoses:  Intractable nausea and vomiting     MEDICATIONS GIVEN DURING THIS VISIT:  Medications  sodium chloride flush (NS) 0.9 % injection 3 mL (has no administration in time  range)  sodium chloride 0.9 % bolus 1,000 mL (0 mLs Intravenous Stopped 11/09/19 0243)  promethazine (PHENERGAN) injection 12.5 mg (12.5 mg Intravenous Given 11/09/19 0039)  promethazine (PHENERGAN) injection 12.5 mg (12.5 mg Intravenous Given 11/09/19 0117)  sodium chloride 0.9 % bolus 1,000 mL (0 mLs Intravenous Stopped 11/09/19 0429)  sodium chloride 0.9 % bolus 1,000 mL (0 mLs Intravenous Stopped 11/09/19 0549)  metoCLOPramide (REGLAN) injection 10 mg (10 mg Intravenous Given 11/09/19 0451)     ED Discharge Orders    None      *Please note:  MERLEAN PIZZINI was evaluated in Emergency Department on 11/09/2019 for the symptoms described in the history of present illness. She was evaluated in the context of the global COVID-19 pandemic, which necessitated consideration that the patient might be at risk for infection with the SARS-CoV-2 virus that causes COVID-19. Institutional protocols and algorithms that pertain to the evaluation of patients at risk for COVID-19 are in a state of rapid change based on information released by regulatory bodies including the CDC and federal and state organizations. These policies and algorithms were  followed during the patient's care in the ED.  Some ED evaluations and interventions may be delayed as a result of limited staffing during the pandemic.*  Note:  This document was prepared using Dragon voice recognition software and may include unintentional dictation errors.   Darci Current, MD 11/09/19 [phone]

## 2019-11-09 NOTE — ED Notes (Signed)
Admitting MD bedside

## 2019-11-09 NOTE — H&P (Signed)
History and Physical    Yvette [name] GMW:[id number] DOB: 29-Mar-1981 DOA: 11/09/2019  PCP: Gennette Pac, FNP   Patient coming from: Home  I have personally briefly reviewed patient's old medical records in [place]  Chief Complaint: Nausea/vomiting  HPI: Yvette [name] is a 39 y.o. female with medical history significant for asthma and alcohol abuse who presents to the emergency room for evaluation of nausea and vomiting that started about 6 PM the day prior to her admission and has been persistent since then.  Patient admits to consuming a lot of alcohol prior to the onset of her symptoms.  She denies having abdominal pain or any changes in her bowel habits.  She denies having any fever, no cough, no shortness of breath, no urinary symptoms headache.  Patient was recently hospitalized in March for similar symptoms and was seen by GI as an outpatient for chronically elevated liver enzymes and low platelet count.  Patient had an ultrasound which showed increased hepatic parenchymal echogenicity.  She is scheduled for an upper endoscopy as an outpatient with GI.     ED Course: Patient seen in the emergency room for evaluation of intractable nausea and vomiting.  Patient received multiple doses of Phenergan and Reglan without any improvement in her symptoms.  She will be admitted to the hospital for further evaluation.  Review of Systems: As per HPI otherwise 10 point review of systems negative.    Past Medical History:  Diagnosis Date  . Asthma   . H/O Kawasaki's disease    as 71 month old child and at 35 years  . History of anemia   . History of blood transfusion 1984  . Leukopenia   . Neutropenia ([place])     History reviewed. No pertinent surgical history.   reports that she has been smoking cigarettes. She has a 3.00 pack-year smoking history. She has never used smokeless tobacco. She reports current alcohol use of about 61.0 standard drinks of alcohol per week. She  reports that she does not use drugs.  No Known Allergies  Family History  Problem Relation Age of Onset  . Lupus Mother   . Cancer Paternal Aunt        Breast   . Cancer Paternal Aunt        Breast      Prior to Admission medications   Medication Sig Start Date End Date Taking? Authorizing Provider  FLUoxetine (PROZAC) 40 MG capsule Take 40 mg by mouth daily.   Yes [provider]  ondansetron (ZOFRAN ODT) 4 MG disintegrating tablet Take 1 tablet (4 mg total) by mouth every 8 (eight) hours as needed for nausea or vomiting. Patient not taking: Reported on 11/09/2019 09/29/19   Harvest Dark, MD  pantoprazole (PROTONIX) 40 MG tablet Take 1 tablet (40 mg total) by mouth daily. Patient not taking: Reported on 11/09/2019 09/29/19 09/28/20  Harvest Dark, MD    Physical Exam: Vitals:   11/09/19 0330 11/09/19 0400 11/09/19 0530 11/09/19 0728  BP: (!) 169/105 (!) 178/105 (!) 178/116 (!) 159/95  Pulse: 93 79 80 67  Resp: (!) 21 16 18 18   Temp:    98.3 F (36.8 C)  TempSrc:    Oral  SpO2: 98% 99% 100% 100%  Weight:      Height:         Vitals:   11/09/19 0330 11/09/19 0400 11/09/19 0530 11/09/19 0728  BP: (!) 169/105 (!) 178/105 (!) 178/116 (!) 159/95  Pulse: 93  79 80 67  Resp: (!) 21 16 18 18   Temp:    98.3 F (36.8 C)  TempSrc:    Oral  SpO2: 98% 99% 100% 100%  Weight:      Height:        Constitutional: NAD, alert and oriented x 3. Looks younger than stated age Eyes: PERRL, lids and conjunctivae normal ENMT: Mucous membranes are moist.  Neck: normal, supple, no masses, no thyromegaly Respiratory: clear to auscultation bilaterally, no wheezing, no crackles. Normal respiratory effort. No accessory muscle use.  Cardiovascular: Regular rate and rhythm, no murmurs / rubs / gallops. No extremity edema. 2+ pedal pulses. No carotid bruits.  Abdomen: no tenderness, no masses palpated. No hepatosplenomegaly. Bowel sounds positive.  Musculoskeletal: no clubbing /  cyanosis. No joint deformity upper and lower extremities.  Skin: no rashes, lesions, ulcers.  Neurologic: No gross focal neurologic deficit. Psychiatric: Normal mood and affect.   Labs on Admission: I have personally reviewed following labs and imaging studies  CBC: Recent Labs  Lab 11/09/19 0034  WBC 10.8*  HGB 12.8  HCT 37.3  MCV 88.0  PLT 148*   Basic Metabolic Panel: Recent Labs  Lab 11/09/19 0034  NA 139  K 4.3  CL 102  CO2 18*  GLUCOSE 109*  BUN 9  CREATININE 0.59  CALCIUM 9.9   GFR: Estimated Creatinine Clearance: 70.3 mL/min (by C-G formula based on SCr of 0.59 mg/dL). Liver Function Tests: Recent Labs  Lab 11/09/19 0034  AST 212*  ALT 82*  ALKPHOS 86  BILITOT 1.3*  PROT 8.8*  ALBUMIN 4.9   Recent Labs  Lab 11/09/19 0034  LIPASE 19   No results for input(s): AMMONIA in the last 168 hours. Coagulation Profile: No results for input(s): INR, PROTIME in the last 168 hours. Cardiac Enzymes: No results for input(s): CKTOTAL, CKMB, CKMBINDEX, TROPONINI in the last 168 hours. BNP (last 3 results) No results for input(s): PROBNP in the last 8760 hours. HbA1C: No results for input(s): HGBA1C in the last 72 hours. CBG: No results for input(s): GLUCAP in the last 168 hours. Lipid Profile: No results for input(s): CHOL, HDL, LDLCALC, TRIG, CHOLHDL, LDLDIRECT in the last 72 hours. Thyroid Function Tests: No results for input(s): TSH, T4TOTAL, FREET4, T3FREE, THYROIDAB in the last 72 hours. Anemia Panel: No results for input(s): VITAMINB12, FOLATE, FERRITIN, TIBC, IRON, RETICCTPCT in the last 72 hours. Urine analysis:    Component Value Date/Time   COLORURINE YELLOW (A) 11/09/2019 0240   APPEARANCEUR CLEAR (A) 11/09/2019 0240   LABSPEC 1.020 11/09/2019 0240   PHURINE 6.0 11/09/2019 0240   GLUCOSEU NEGATIVE 11/09/2019 0240   HGBUR SMALL (A) 11/09/2019 0240   BILIRUBINUR NEGATIVE 11/09/2019 0240   KETONESUR 80 (A) 11/09/2019 0240   PROTEINUR 100 (A)  11/09/2019 0240   NITRITE NEGATIVE 11/09/2019 0240   LEUKOCYTESUR NEGATIVE 11/09/2019 0240    Radiological Exams on Admission: No results found.  EKG: Independently reviewed.  Sinus rhythm Nonspecific T wave changes  Assessment/Plan Principal Problem:   Alcoholic gastritis Active Problems:   Alcohol use, daily   Elevated LFTs    Alcoholic gastritis Supportive care IV fluid hydration IV PPI Place patient on IV antiemetics and keep n.p.o. for now We will consult GI if symptoms do not improve for an endoscopy inpatient.   If patient improves, she will need to follow-up with GI as an outpatient for her already scheduled upper endoscopy   Transaminitis AST >> ALT Secondary to alcohol use Patient has  been counseled on the need to abstain from further alcohol use   Hypertension Blood pressure is uncontrolled We will place patient on IV antihypertensive medication  Patient is at increased risk for developing symptoms of alcohol withdrawal.  Will place on CIWA protocol with lorazepam  DVT prophylaxis: Lovenox Code Status: Full Family Communication: Greater than 50% of time was spent discussing plan of care with patient at the bedside.  All questions and concerns have been addressed. Disposition Plan: Back to previous home environment Consults called: None    Esbeydi Manago MD [facility]     11/09/2019, 8:45 AM

## 2019-11-09 NOTE — ED Notes (Signed)
Pt in with co vomiting intermittently since February, states has apptm with GI to rule out pancreatitis. Pt is a daily drinker, states vomiting became worse Wednesday. Not able to keep food or fluids down. Denies any diarrhea or dysuria, no abd pain.

## 2019-11-09 NOTE — ED Notes (Signed)
Pt up to bathroom, states nausea is better.

## 2019-11-09 NOTE — ED Notes (Signed)
This tech transferred pt to room 111.

## 2019-11-10 DIAGNOSIS — Z20822 Contact with and (suspected) exposure to covid-19: Secondary | ICD-10-CM | POA: Diagnosis present

## 2019-11-10 DIAGNOSIS — F17210 Nicotine dependence, cigarettes, uncomplicated: Secondary | ICD-10-CM | POA: Diagnosis present

## 2019-11-10 DIAGNOSIS — R7401 Elevation of levels of liver transaminase levels: Secondary | ICD-10-CM | POA: Diagnosis present

## 2019-11-10 DIAGNOSIS — R197 Diarrhea, unspecified: Secondary | ICD-10-CM | POA: Diagnosis present

## 2019-11-10 DIAGNOSIS — F1010 Alcohol abuse, uncomplicated: Secondary | ICD-10-CM | POA: Diagnosis present

## 2019-11-10 DIAGNOSIS — R112 Nausea with vomiting, unspecified: Secondary | ICD-10-CM | POA: Diagnosis present

## 2019-11-10 DIAGNOSIS — J45909 Unspecified asthma, uncomplicated: Secondary | ICD-10-CM | POA: Diagnosis present

## 2019-11-10 DIAGNOSIS — Z832 Family history of diseases of the blood and blood-forming organs and certain disorders involving the immune mechanism: Secondary | ICD-10-CM | POA: Diagnosis not present

## 2019-11-10 DIAGNOSIS — R030 Elevated blood-pressure reading, without diagnosis of hypertension: Secondary | ICD-10-CM | POA: Diagnosis present

## 2019-11-10 DIAGNOSIS — E876 Hypokalemia: Secondary | ICD-10-CM | POA: Diagnosis not present

## 2019-11-10 DIAGNOSIS — K2920 Alcoholic gastritis without bleeding: Secondary | ICD-10-CM | POA: Diagnosis not present

## 2019-11-10 LAB — CBC
HCT: 28.1 % — ABNORMAL LOW (ref 36.0–46.0)
Hemoglobin: 9.6 g/dL — ABNORMAL LOW (ref 12.0–15.0)
MCH: 30.4 pg (ref 26.0–34.0)
MCHC: 34.2 g/dL (ref 30.0–36.0)
MCV: 88.9 fL (ref 80.0–100.0)
Platelets: 103 10*3/uL — ABNORMAL LOW (ref 150–400)
RBC: 3.16 MIL/uL — ABNORMAL LOW (ref 3.87–5.11)
RDW: 14.5 % (ref 11.5–15.5)
WBC: 5 10*3/uL (ref 4.0–10.5)
nRBC: 0 % (ref 0.0–0.2)

## 2019-11-10 LAB — BASIC METABOLIC PANEL
Anion gap: 8 (ref 5–15)
BUN: 8 mg/dL (ref 6–20)
CO2: 20 mmol/L — ABNORMAL LOW (ref 22–32)
Calcium: 8.3 mg/dL — ABNORMAL LOW (ref 8.9–10.3)
Chloride: 111 mmol/L (ref 98–111)
Creatinine, Ser: 0.57 mg/dL (ref 0.44–1.00)
GFR calc Af Amer: >60 mL/min (ref >60)
GFR calc non Af Amer: >60 mL/min (ref >60)
Glucose, Bld: 76 mg/dL (ref 70–99)
Potassium: 3.8 mmol/L (ref 3.5–5.1)
Sodium: 139 mmol/L (ref 135–145)

## 2019-11-10 MED ORDER — ONDANSETRON 4 MG PO TBDP
4.0000 mg | ORAL_TABLET | Freq: Three times a day (TID) | ORAL | Status: DC | PRN
  Filled 2019-11-10: qty 1

## 2019-11-10 MED ORDER — GUAIFENESIN-DM 100-10 MG/5ML PO SYRP: 10.0000 mL | ORAL_SOLUTION | Freq: Four times a day (QID) | ORAL | Status: DC | PRN

## 2019-11-10 MED ORDER — POLYETHYLENE GLYCOL 3350 17 G PO PACK: 17.0000 g | PACK | Freq: Two times a day (BID) | ORAL | Status: DC | PRN

## 2019-11-10 MED ORDER — ALUM & MAG HYDROXIDE-SIMETH 200-200-20 MG/5ML PO SUSP: 15.0000 mL | Freq: Four times a day (QID) | ORAL | Status: DC | PRN

## 2019-11-10 MED ORDER — CALCIUM CARBONATE ANTACID 500 MG PO CHEW: 1.0000 | CHEWABLE_TABLET | Freq: Three times a day (TID) | ORAL | Status: DC | PRN

## 2019-11-10 MED ORDER — DOCUSATE SODIUM 100 MG PO CAPS: 100.0000 mg | ORAL_CAPSULE | Freq: Two times a day (BID) | ORAL | Status: DC | PRN

## 2019-11-10 MED ORDER — PROMETHAZINE HCL 25 MG/ML IJ SOLN
25.0000 mg | INTRAMUSCULAR | Status: DC | PRN
  Administered 2019-11-10 – 2019-11-13 (×5): 25 mg via INTRAVENOUS
  Filled 2019-11-10 (×5): qty 1

## 2019-11-10 MED ORDER — ONDANSETRON HCL 4 MG/2ML IJ SOLN
4.0000 mg | Freq: Four times a day (QID) | INTRAMUSCULAR | Status: DC | PRN
  Administered 2019-11-10 – 2019-11-13 (×3): 4 mg via INTRAVENOUS
  Filled 2019-11-10 (×3): qty 2

## 2019-11-10 NOTE — Progress Notes (Signed)
Pt actively vomiting. IV zofran given per PRN order. Will continue to monitor.

## 2019-11-10 NOTE — Progress Notes (Signed)
PROGRESS NOTE    CHRISTIANNE ZACHER  WUJ:[id number] DOB: 28-Jun-1981 DOA: 11/09/2019 PCP: Toy Cookey, FNP    Assessment & Plan:   Principal Problem:   Alcoholic gastritis Active Problems:   Alcohol use, daily   Elevated LFTs    Manuelita Moxon Castille is a 39 y.o. AA female with medical history significant for asthma and alcohol abuse who presents to the emergency room for evaluation of nausea and vomiting that started about 6 PM the day prior to her admission and has been persistent since then.  Patient admits to consuming a lot of alcohol prior to the onset of her symptoms.   # N/V/D --N/V worsened by fatty foods.  Seems more of a GI issue than alcohol withdrawal-related. --s/p MIVF PLAN: --Clear liquid diet for now --supportive care with anti-emetics --d/c MIVF and encourage oral fluid intake  Current everyday heavy alcohol user Transaminitis Secondary to alcohol use AST >> ALT Patient has been counseled on the need to abstain from further alcohol use --CIWA  Elevated BP, resolved No hx of HTN and not on BP med at home.  Elevated BP likely due to stress from N/V.   DVT prophylaxis: Lovenox SQ Code Status: Full code  Family Communication:  Status is: changed to inpatient today from observation Dispo:   The patient is from: home Anticipated d/c is to: home Anticipated d/c date is: tomorrow Patient currently is not medically stable to d/c due to: can not tolerate PO intake due to N/V.   Subjective and Interval History:  This morning, pt reported N/V improved, so resumed on diet, however, started vomiting again after eating breakfast foods.  Nursing also reported diarrhea.  No fever, dyspnea, chest pain.    Objective: Vitals:   11/09/19 1407 11/09/19 1500 11/09/19 2330 11/10/19 0800  BP: 135/89 140/65 129/69 121/71  Pulse:  86 92 67  Resp: 18 17 16 17   Temp: 98.9 F (37.2 C) 98.8 F (37.1 C) 98.8 F (37.1 C) 98.7 F (37.1 C)  TempSrc: Oral Oral Oral Oral    SpO2: 100% 100% 100% 99%  Weight:      Height:        Intake/Output Summary (Last 24 hours) at 11/10/2019 1521 Last data filed at 11/09/2019 1600 Gross per 24 hour  Intake 319.99 ml  Output --  Net 319.99 ml   Filed Weights   11/08/19 2358  Weight: 45.8 kg    Examination:   Constitutional: NAD, AAOx3 HEENT: conjunctivae and lids normal, EOMI CV: RRR no M,R,G. Distal pulses +2.  No cyanosis.   RESP: CTA B/L, normal respiratory effort  GI: +BS, NTND Extremities: No effusions, edema, or tenderness in BLE MSK: normal ROM and strength, no joint enlargement or tenderness of both UE and LE SKIN: warm, dry and intact Neuro: II - XII grossly intact.  Sensation intact Psych: Normal mood and affect.  Appropriate judgement and reason   Data Reviewed: I have personally reviewed following labs and imaging studies  CBC: Recent Labs  Lab 11/09/19 0034 11/10/19 0441  WBC 10.8* 5.0  HGB 12.8 9.6*  HCT 37.3 28.1*  MCV 88.0 88.9  PLT 148* 103*   Basic Metabolic Panel: Recent Labs  Lab 11/09/19 0034 11/09/19 1132 11/10/19 0441  NA 139  --  139  K 4.3  --  3.8  CL 102  --  111  CO2 18*  --  20*  GLUCOSE 109*  --  76  BUN 9  --  8  CREATININE  0.59  --  0.57  CALCIUM 9.9  --  8.3*  MG  --  1.4*  --   PHOS  --  4.0  --    GFR: Estimated Creatinine Clearance: 70.3 mL/min (by C-G formula based on SCr of 0.57 mg/dL). Liver Function Tests: Recent Labs  Lab 11/09/19 0034  AST 212*  ALT 82*  ALKPHOS 86  BILITOT 1.3*  PROT 8.8*  ALBUMIN 4.9   Recent Labs  Lab 11/09/19 0034  LIPASE 19   No results for input(s): AMMONIA in the last 168 hours. Coagulation Profile: No results for input(s): INR, PROTIME in the last 168 hours. Cardiac Enzymes: No results for input(s): CKTOTAL, CKMB, CKMBINDEX, TROPONINI in the last 168 hours. BNP (last 3 results) No results for input(s): PROBNP in the last 8760 hours. HbA1C: No results for input(s): HGBA1C in the last 72  hours. CBG: No results for input(s): GLUCAP in the last 168 hours. Lipid Profile: No results for input(s): CHOL, HDL, LDLCALC, TRIG, CHOLHDL, LDLDIRECT in the last 72 hours. Thyroid Function Tests: No results for input(s): TSH, T4TOTAL, FREET4, T3FREE, THYROIDAB in the last 72 hours. Anemia Panel: No results for input(s): VITAMINB12, FOLATE, FERRITIN, TIBC, IRON, RETICCTPCT in the last 72 hours. Sepsis Labs: No results for input(s): PROCALCITON, LATICACIDVEN in the last 168 hours.  Recent Results (from the past 240 hour(s))  Respiratory Panel by RT PCR (Flu A&B, Covid) - Nasopharyngeal Swab     Status: None   Collection Time: 11/09/19 11:32 AM   Specimen: Nasopharyngeal Swab  Result Value Ref Range Status   SARS Coronavirus 2 by RT PCR NEGATIVE NEGATIVE Final    Comment: (NOTE) SARS-CoV-2 target nucleic acids are NOT DETECTED. The SARS-CoV-2 RNA is generally detectable in upper respiratoy specimens during the acute phase of infection. The lowest concentration of SARS-CoV-2 viral copies this assay can detect is 131 copies/mL. A negative result does not preclude SARS-Cov-2 infection and should not be used as the sole basis for treatment or other patient management decisions. A negative result may occur with  improper specimen collection/handling, submission of specimen other than nasopharyngeal swab, presence of viral mutation(s) within the areas targeted by this assay, and inadequate number of viral copies (<131 copies/mL). A negative result must be combined with clinical observations, patient history, and epidemiological information. The expected result is Negative. Fact Sheet for Patients:  PinkCheek.be Fact Sheet for Healthcare Providers:  GravelBags.it This test is not yet ap proved or cleared by the [place] FDA and  has been authorized for detection and/or diagnosis of SARS-CoV-2 by FDA under an Emergency Use  Authorization (EUA). This EUA will remain  in effect (meaning this test can be used) for the duration of the COVID-19 declaration under Section 564(b)(1) of the Act, 21 U.S.C. section 360bbb-3(b)(1), unless the authorization is terminated or revoked sooner.    Influenza A by PCR NEGATIVE NEGATIVE Final   Influenza B by PCR NEGATIVE NEGATIVE Final    Comment: (NOTE) The Xpert Xpress SARS-CoV-2/FLU/RSV assay is intended as an aid in  the diagnosis of influenza from Nasopharyngeal swab specimens and  should not be used as a sole basis for treatment. Nasal washings and  aspirates are unacceptable for Xpert Xpress SARS-CoV-2/FLU/RSV  testing. Fact Sheet for Patients: PinkCheek.be Fact Sheet for Healthcare Providers: GravelBags.it This test is not yet approved or cleared by the [place] FDA and  has been authorized for detection and/or diagnosis of SARS-CoV-2 by  FDA under an Emergency Use Authorization (EUA).  This EUA will remain  in effect (meaning this test can be used) for the duration of the  Covid-19 declaration under Section 564(b)(1) of the Act, 21  U.S.C. section 360bbb-3(b)(1), unless the authorization is  terminated or revoked. Performed at [facility], [address], [place], [place] [address]       Radiology Studies: No results found.   Scheduled Meds: . enoxaparin (LOVENOX) injection  40 mg Subcutaneous Q24H  . FLUoxetine  40 mg Oral Daily  . LORazepam  0-4 mg Intravenous Q6H   Followed by  . [START ON 11/11/2019] LORazepam  0-4 mg Intravenous Q12H  . metoCLOPramide (REGLAN) injection  10 mg Intravenous Q6H  . pantoprazole (PROTONIX) IV  40 mg Intravenous Q24H  . sodium chloride flush  3 mL Intravenous Once   Continuous Infusions:   LOS: 0 days     Darlin Priestly, MD [facility] If 7PM-7AM, please contact night-coverage 11/10/2019, 3:21 PM

## 2019-11-11 LAB — BASIC METABOLIC PANEL
Anion gap: 14 (ref 5–15)
BUN: 6 mg/dL (ref 6–20)
CO2: 18 mmol/L — ABNORMAL LOW (ref 22–32)
Calcium: 8.9 mg/dL (ref 8.9–10.3)
Chloride: 107 mmol/L (ref 98–111)
Creatinine, Ser: 0.73 mg/dL (ref 0.44–1.00)
GFR calc Af Amer: >60 mL/min (ref >60)
GFR calc non Af Amer: >60 mL/min (ref >60)
Glucose, Bld: 79 mg/dL (ref 70–99)
Potassium: 3.2 mmol/L — ABNORMAL LOW (ref 3.5–5.1)
Sodium: 139 mmol/L (ref 135–145)

## 2019-11-11 LAB — MAGNESIUM: Magnesium: 1.8 mg/dL (ref 1.7–2.4)

## 2019-11-11 LAB — CBC
HCT: 33.2 % — ABNORMAL LOW (ref 36.0–46.0)
Hemoglobin: 11.3 g/dL — ABNORMAL LOW (ref 12.0–15.0)
MCH: 30 pg (ref 26.0–34.0)
MCHC: 34 g/dL (ref 30.0–36.0)
MCV: 88.1 fL (ref 80.0–100.0)
Platelets: 101 10*3/uL — ABNORMAL LOW (ref 150–400)
RBC: 3.77 MIL/uL — ABNORMAL LOW (ref 3.87–5.11)
RDW: 14.3 % (ref 11.5–15.5)
WBC: 5.8 10*3/uL (ref 4.0–10.5)
nRBC: 0 % (ref 0.0–0.2)

## 2019-11-11 MED ORDER — POTASSIUM CHLORIDE CRYS ER 20 MEQ PO TBCR
40.0000 meq | EXTENDED_RELEASE_TABLET | Freq: Once | ORAL | Status: AC
  Administered 2019-11-11: 09:00:00 40 meq via ORAL
  Filled 2019-11-11: qty 2

## 2019-11-11 NOTE — Progress Notes (Signed)
PROGRESS NOTE    Yvette [name]  TML:[id number] DOB: 05-May-1981 DOA: 11/09/2019 PCP: Toy Cookey, FNP    Assessment & Plan:   Principal Problem:   Alcoholic gastritis Active Problems:   Alcohol use, daily   Elevated LFTs   Nausea & vomiting    Yvette [name] is a 39 y.o. AA female with medical history significant for asthma and alcohol abuse who presents to the emergency room for evaluation of nausea and vomiting that started about 6 PM the day prior to her admission and has been persistent since then.  Patient admits to consuming a lot of alcohol prior to the onset of her symptoms.   # N/V/D --N/V worsened by fatty foods.  Seems more of a GI issue than alcohol withdrawal-related. --s/p MIVF PLAN: --advanced to Full liquid today --supportive care with anti-emetics --Hold MIVF and encourage oral fluid intake  Current everyday heavy alcohol user Transaminitis Secondary to alcohol use AST >> ALT Patient has been counseled on the need to abstain from further alcohol use --CIWA  Elevated BP, resolved No hx of HTN and not on BP med at home.  Elevated BP likely due to stress from N/V.   DVT prophylaxis: Lovenox SQ Code Status: Full code  Family Communication:  Status is: changed to inpatient from observation Dispo:   The patient is from: home Anticipated d/c is to: home Anticipated d/c date is: tomorrow Patient currently is not medically stable to d/c due to: can not tolerate PO intake due to N/V.   Subjective and Interval History:  Pt was still having vomiting, however, wanted to try to advance diet, so Full liquid ordered.  Pt was noted to vomit 1 hour after eating.  Some minor loose stool, not significant diarrhea.  No fever, dyspnea, chest pain, abdominal pain.  Having urine output.   Objective: Vitals:   11/09/19 2330 11/10/19 0800 11/11/19 0054 11/11/19 1403  BP: 129/69 121/71 (!) 149/97 (!) 164/117  Pulse: 92 67 77 75  Resp: 16 17 20 20   Temp: 98.8 F  (37.1 C) 98.7 F (37.1 C) 98.9 F (37.2 C) 99.1 F (37.3 C)  TempSrc: Oral Oral Oral Oral  SpO2: 100% 99% 100% 100%  Weight:      Height:        Intake/Output Summary (Last 24 hours) at 11/11/2019 1624 Last data filed at 11/11/2019 1030 Gross per 24 hour  Intake 120 ml  Output --  Net 120 ml   Filed Weights   11/08/19 2358  Weight: 45.8 kg    Examination:   Constitutional: NAD, AAOx3 HEENT: conjunctivae and lids normal, EOMI CV: RRR no M,R,G. Distal pulses +2.  No cyanosis.   RESP: CTA B/L, normal respiratory effort  GI: +BS, NTND Extremities: No effusions, edema, or tenderness in BLE SKIN: warm, dry and intact Neuro: II - XII grossly intact.  Sensation intact Psych: Depressed mood and affect.     Data Reviewed: I have personally reviewed following labs and imaging studies  CBC: Recent Labs  Lab 11/09/19 0034 11/10/19 0441 11/11/19 0444  WBC 10.8* 5.0 5.8  HGB 12.8 9.6* 11.3*  HCT 37.3 28.1* 33.2*  MCV 88.0 88.9 88.1  PLT 148* 103* 101*   Basic Metabolic Panel: Recent Labs  Lab 11/09/19 0034 11/09/19 1132 11/10/19 0441 11/11/19 0444  NA 139  --  139 139  K 4.3  --  3.8 3.2*  CL 102  --  111 107  CO2 18*  --  20* 18*  GLUCOSE 109*  --  76 79  BUN 9  --  8 6  CREATININE 0.59  --  0.57 0.73  CALCIUM 9.9  --  8.3* 8.9  MG  --  1.4*  --  1.8  PHOS  --  4.0  --   --    GFR: Estimated Creatinine Clearance: 70.3 mL/min (by C-G formula based on SCr of 0.73 mg/dL). Liver Function Tests: Recent Labs  Lab 11/09/19 0034  AST 212*  ALT 82*  ALKPHOS 86  BILITOT 1.3*  PROT 8.8*  ALBUMIN 4.9   Recent Labs  Lab 11/09/19 0034  LIPASE 19   No results for input(s): AMMONIA in the last 168 hours. Coagulation Profile: No results for input(s): INR, PROTIME in the last 168 hours. Cardiac Enzymes: No results for input(s): CKTOTAL, CKMB, CKMBINDEX, TROPONINI in the last 168 hours. BNP (last 3 results) No results for input(s): PROBNP in the last 8760  hours. HbA1C: No results for input(s): HGBA1C in the last 72 hours. CBG: No results for input(s): GLUCAP in the last 168 hours. Lipid Profile: No results for input(s): CHOL, HDL, LDLCALC, TRIG, CHOLHDL, LDLDIRECT in the last 72 hours. Thyroid Function Tests: No results for input(s): TSH, T4TOTAL, FREET4, T3FREE, THYROIDAB in the last 72 hours. Anemia Panel: No results for input(s): VITAMINB12, FOLATE, FERRITIN, TIBC, IRON, RETICCTPCT in the last 72 hours. Sepsis Labs: No results for input(s): PROCALCITON, LATICACIDVEN in the last 168 hours.  Recent Results (from the past 240 hour(s))  Respiratory Panel by RT PCR (Flu A&B, Covid) - Nasopharyngeal Swab     Status: None   Collection Time: 11/09/19 11:32 AM   Specimen: Nasopharyngeal Swab  Result Value Ref Range Status   SARS Coronavirus 2 by RT PCR NEGATIVE NEGATIVE Final    Comment: (NOTE) SARS-CoV-2 target nucleic acids are NOT DETECTED. The SARS-CoV-2 RNA is generally detectable in upper respiratoy specimens during the acute phase of infection. The lowest concentration of SARS-CoV-2 viral copies this assay can detect is 131 copies/mL. A negative result does not preclude SARS-Cov-2 infection and should not be used as the sole basis for treatment or other patient management decisions. A negative result may occur with  improper specimen collection/handling, submission of specimen other than nasopharyngeal swab, presence of viral mutation(s) within the areas targeted by this assay, and inadequate number of viral copies (<131 copies/mL). A negative result must be combined with clinical observations, patient history, and epidemiological information. The expected result is Negative. Fact Sheet for Patients:  https://www.moore.com/ Fact Sheet for Healthcare Providers:  https://www.young.biz/ This test is not yet ap proved or cleared by the [place] FDA and  has been authorized for detection  and/or diagnosis of SARS-CoV-2 by FDA under an Emergency Use Authorization (EUA). This EUA will remain  in effect (meaning this test can be used) for the duration of the COVID-19 declaration under Section 564(b)(1) of the Act, 21 U.S.C. section 360bbb-3(b)(1), unless the authorization is terminated or revoked sooner.    Influenza A by PCR NEGATIVE NEGATIVE Final   Influenza B by PCR NEGATIVE NEGATIVE Final    Comment: (NOTE) The Xpert Xpress SARS-CoV-2/FLU/RSV assay is intended as an aid in  the diagnosis of influenza from Nasopharyngeal swab specimens and  should not be used as a sole basis for treatment. Nasal washings and  aspirates are unacceptable for Xpert Xpress SARS-CoV-2/FLU/RSV  testing. Fact Sheet for Patients: https://www.moore.com/ Fact Sheet for Healthcare Providers: https://www.young.biz/ This test is not yet approved or cleared by the  [place] Architectural techn[employer] and  has been authorized for detection and/or diagnosis of SARS-CoV-2 by  FDA under an Print production planner (EUA). This EUA will remain  in effect (meaning this test can be used) for the duration of the  Covid-19 declaration under Section 564(b)(1) of the Act, 21  U.S.C. section 360bbb-3(b)(1), unless the authorization is  terminated or revoked. Performed at [facility], [address], [place], [place] [address]       Radiology Studies: No results found.   Scheduled Meds: . enoxaparin (LOVENOX) injection  40 mg Subcutaneous Q24H  . FLUoxetine  40 mg Oral Daily  . sodium chloride flush  3 mL Intravenous Once   Continuous Infusions:   LOS: 1 day     Enzo Bi, MD [facility] If 7PM-7AM, please contact night-coverage 11/11/2019, 4:24 PM

## 2019-11-12 LAB — BASIC METABOLIC PANEL
Anion gap: 8 (ref 5–15)
BUN: 8 mg/dL (ref 6–20)
CO2: 23 mmol/L (ref 22–32)
Calcium: 8.7 mg/dL — ABNORMAL LOW (ref 8.9–10.3)
Chloride: 104 mmol/L (ref 98–111)
Creatinine, Ser: 0.68 mg/dL (ref 0.44–1.00)
GFR calc Af Amer: >60 mL/min (ref >60)
GFR calc non Af Amer: >60 mL/min (ref >60)
Glucose, Bld: 113 mg/dL — ABNORMAL HIGH (ref 70–99)
Potassium: 2.9 mmol/L — ABNORMAL LOW (ref 3.5–5.1)
Sodium: 135 mmol/L (ref 135–145)

## 2019-11-12 LAB — CBC
HCT: 36.3 % (ref 36.0–46.0)
Hemoglobin: 12.4 g/dL (ref 12.0–15.0)
MCH: 29.5 pg (ref 26.0–34.0)
MCHC: 34.2 g/dL (ref 30.0–36.0)
MCV: 86.4 fL (ref 80.0–100.0)
Platelets: 118 10*3/uL — ABNORMAL LOW (ref 150–400)
RBC: 4.2 MIL/uL (ref 3.87–5.11)
RDW: 13.5 % (ref 11.5–15.5)
WBC: 5.1 10*3/uL (ref 4.0–10.5)
nRBC: 0 % (ref 0.0–0.2)

## 2019-11-12 LAB — POTASSIUM: Potassium: 4 mmol/L (ref 3.5–5.1)

## 2019-11-12 LAB — MAGNESIUM: Magnesium: 1.7 mg/dL (ref 1.7–2.4)

## 2019-11-12 MED ORDER — LORAZEPAM 1 MG PO TABS: 1.0000 mg | ORAL_TABLET | ORAL | Status: DC | PRN

## 2019-11-12 MED ORDER — POTASSIUM CHLORIDE 20 MEQ PO PACK
40.0000 meq | PACK | ORAL | Status: AC
  Administered 2019-11-12 (×2): 40 meq via ORAL
  Filled 2019-11-12 (×2): qty 2

## 2019-11-12 MED ORDER — LORAZEPAM 2 MG/ML IJ SOLN
1.0000 mg | INTRAMUSCULAR | Status: DC | PRN
  Administered 2019-11-13: 1 mg via INTRAVENOUS
  Filled 2019-11-12: qty 1

## 2019-11-12 MED ORDER — POTASSIUM CHLORIDE CRYS ER 20 MEQ PO TBCR: 40.0000 meq | EXTENDED_RELEASE_TABLET | ORAL | Status: DC

## 2019-11-12 NOTE — Progress Notes (Signed)
PROGRESS NOTE    Yvette [name]  SEG:[id number] DOB: 1980/09/14 DOA: 11/09/2019 PCP: Toy Cookey, FNP    Assessment & Plan:   Principal Problem:   Alcoholic gastritis Active Problems:   Alcohol use, daily   Elevated LFTs   Nausea & vomiting    Yvette [name] is a 39 y.o. AA female with medical history significant for asthma and alcohol abuse who presents to the emergency room for evaluation of nausea and vomiting that started about 6 PM the day prior to her admission and has been persistent since then.  Patient admits to consuming a lot of alcohol prior to the onset of her symptoms.   # N/V, resolved # Diarrhea, worsened --N/V worsened by fatty foods.  Seems more of a GI issue than alcohol withdrawal-related.  Now having profuse diarrhea. --s/p MIVF PLAN: --advanced to regular diet today --supportive care with anti-emetics --Hold MIVF and encourage oral fluid intake, since no signs of dehydration   # Hypokalemia --K+ dropped to 2.9 from 3.2 yesterday despite already having received repletion.  Likely due to GI loss from diarrhea. PLAN: --Potassium suppl 40 mEq x2 today --recheck K in the pm.  Current everyday heavy alcohol user Transaminitis Secondary to alcohol use AST >> ALT Patient has been counseled on the need to abstain from further alcohol use --CIWA  Elevated BP, resolved No hx of HTN and not on BP med at home.  Elevated BP likely due to stress from N/V.   DVT prophylaxis: Lovenox SQ Code Status: Full code  Family Communication:  Status is: changed to inpatient from observation Dispo:   The patient is from: home Anticipated d/c is to: home Anticipated d/c date is: tomorrow Patient currently is not medically stable to d/c due to: Started having profuse diarrhea and had significant and acute drop in K+ despite repletion, therefore needs to stay inpatient for further monitoring of K level.   Subjective and Interval History:  Pt reported no more  vomiting, however, started having profuse watery diarrhea, ~5 episodes overnight.  No fever, dyspnea, chest pain, abdominal pain, dysuria.  Significant K+ drop this morning.   Objective: Vitals:   11/11/19 2357 11/12/19 0014 11/12/19 0019 11/12/19 0720  BP: (!) 121/97   103/82  Pulse: (!) 115 (!) 105 86 90  Resp: 14   20  Temp: 98.7 F (37.1 C)   98.3 F (36.8 C)  TempSrc: Oral   Oral  SpO2: 100% 100%  100%  Weight:      Height:        Intake/Output Summary (Last 24 hours) at 11/12/2019 1559 Last data filed at 11/12/2019 1407 Gross per 24 hour  Intake 120 ml  Output --  Net 120 ml   Filed Weights   11/08/19 2358  Weight: 45.8 kg    Examination:   Constitutional: NAD, AAOx3 HEENT: conjunctivae and lids normal, EOMI CV: RRR no M,R,G. Distal pulses +2.  No cyanosis.   RESP: CTA B/L, normal respiratory effort  GI: +BS, NTND Extremities: No effusions, edema, or tenderness in BLE SKIN: warm, dry and intact Neuro: II - XII grossly intact.  Sensation intact Psych: better mood and affect.     Data Reviewed: I have personally reviewed following labs and imaging studies  CBC: Recent Labs  Lab 11/09/19 0034 11/10/19 0441 11/11/19 0444 11/12/19 0543  WBC 10.8* 5.0 5.8 5.1  HGB 12.8 9.6* 11.3* 12.4  HCT 37.3 28.1* 33.2* 36.3  MCV 88.0 88.9 88.1 86.4  PLT 148*  103* 101* 443*   Basic Metabolic Panel: Recent Labs  Lab 11/09/19 0034 11/09/19 1132 11/10/19 0441 11/11/19 0444 11/12/19 0543  NA 139  --  139 139 135  K 4.3  --  3.8 3.2* 2.9*  CL 102  --  111 107 104  CO2 18*  --  20* 18* 23  GLUCOSE 109*  --  76 79 113*  BUN 9  --  8 6 8   CREATININE 0.59  --  0.57 0.73 0.68  CALCIUM 9.9  --  8.3* 8.9 8.7*  MG  --  1.4*  --  1.8 1.7  PHOS  --  4.0  --   --   --    GFR: Estimated Creatinine Clearance: 70.3 mL/min (by C-G formula based on SCr of 0.68 mg/dL). Liver Function Tests: Recent Labs  Lab 11/09/19 0034  AST 212*  ALT 82*  ALKPHOS 86  BILITOT 1.3*   PROT 8.8*  ALBUMIN 4.9   Recent Labs  Lab 11/09/19 0034  LIPASE 19   No results for input(s): AMMONIA in the last 168 hours. Coagulation Profile: No results for input(s): INR, PROTIME in the last 168 hours. Cardiac Enzymes: No results for input(s): CKTOTAL, CKMB, CKMBINDEX, TROPONINI in the last 168 hours. BNP (last 3 results) No results for input(s): PROBNP in the last 8760 hours. HbA1C: No results for input(s): HGBA1C in the last 72 hours. CBG: No results for input(s): GLUCAP in the last 168 hours. Lipid Profile: No results for input(s): CHOL, HDL, LDLCALC, TRIG, CHOLHDL, LDLDIRECT in the last 72 hours. Thyroid Function Tests: No results for input(s): TSH, T4TOTAL, FREET4, T3FREE, THYROIDAB in the last 72 hours. Anemia Panel: No results for input(s): VITAMINB12, FOLATE, FERRITIN, TIBC, IRON, RETICCTPCT in the last 72 hours. Sepsis Labs: No results for input(s): PROCALCITON, LATICACIDVEN in the last 168 hours.  Recent Results (from the past 240 hour(s))  Respiratory Panel by RT PCR (Flu A&B, Covid) - Nasopharyngeal Swab     Status: None   Collection Time: 11/09/19 11:32 AM   Specimen: Nasopharyngeal Swab  Result Value Ref Range Status   SARS Coronavirus 2 by RT PCR NEGATIVE NEGATIVE Final    Comment: (NOTE) SARS-CoV-2 target nucleic acids are NOT DETECTED. The SARS-CoV-2 RNA is generally detectable in upper respiratoy specimens during the acute phase of infection. The lowest concentration of SARS-CoV-2 viral copies this assay can detect is 131 copies/mL. A negative result does not preclude SARS-Cov-2 infection and should not be used as the sole basis for treatment or other patient management decisions. A negative result may occur with  improper specimen collection/handling, submission of specimen other than nasopharyngeal swab, presence of viral mutation(s) within the areas targeted by this assay, and inadequate number of viral copies (<131 copies/mL). A negative  result must be combined with clinical observations, patient history, and epidemiological information. The expected result is Negative. Fact Sheet for Patients:  PinkCheek.be Fact Sheet for Healthcare Providers:  GravelBags.it This test is not yet ap proved or cleared by the [place] FDA and  has been authorized for detection and/or diagnosis of SARS-CoV-2 by FDA under an Emergency Use Authorization (EUA). This EUA will remain  in effect (meaning this test can be used) for the duration of the COVID-19 declaration under Section 564(b)(1) of the Act, 21 U.S.C. section 360bbb-3(b)(1), unless the authorization is terminated or revoked sooner.    Influenza A by PCR NEGATIVE NEGATIVE Final   Influenza B by PCR NEGATIVE NEGATIVE Final    Comment: (  NOTE) The Xpert Xpress SARS-CoV-2/FLU/RSV assay is intended as an aid in  the diagnosis of influenza from Nasopharyngeal swab specimens and  should not be used as a sole basis for treatment. Nasal washings and  aspirates are unacceptable for Xpert Xpress SARS-CoV-2/FLU/RSV  testing. Fact Sheet for Patients: https://www.moore.com/ Fact Sheet for Healthcare Providers: https://www.young.biz/ This test is not yet approved or cleared by the [place] FDA and  has been authorized for detection and/or diagnosis of SARS-CoV-2 by  FDA under an Emergency Use Authorization (EUA). This EUA will remain  in effect (meaning this test can be used) for the duration of the  Covid-19 declaration under Section 564(b)(1) of the Act, 21  U.S.C. section 360bbb-3(b)(1), unless the authorization is  terminated or revoked. Performed at [facility], [address] [place], [place] [address]       Radiology Studies: No results found.   Scheduled Meds: . enoxaparin (LOVENOX) injection  40 mg Subcutaneous Q24H  . FLUoxetine  40 mg Oral Daily  .  potassium chloride  40 mEq Oral Q4H  . sodium chloride flush  3 mL Intravenous Once   Continuous Infusions:   LOS: 2 days     Darlin Priestly, MD [facility] If 7PM-7AM, please contact night-coverage 11/12/2019, 3:59 PM

## 2019-11-12 NOTE — Progress Notes (Signed)
   11/11/19 2357  Vitals  Temp 98.7 F (37.1 C)  Temp Source Oral  BP (!) 121/97  MAP (mmHg) 106  BP Location Right Arm  BP Method Automatic  Patient Position (if appropriate) Sitting  Pulse Rate (!) 115 (pt with tremmor of hand)  Pulse Rate Source Monitor  Resp 14  Oxygen Therapy  SpO2 100 %  MEWS Score  MEWS Temp 0  MEWS Systolic 0  MEWS Pulse 2  MEWS RR 0  MEWS LOC 0  MEWS Score 2  MEWS Score Color Yellow    Pt focused assessment revealed tremor of hands. Pulse retaken vis dinamap with attempt to steady had read rate of 105. Manual count of pulse for one minute revealed pulse of 86. CIWA scale rechecked score of two for mild tremor. 1 mg of ativan PO administered to pt. Will continue to monitor. With correction of pulse Mews score is green. Manual pulse counted via this RN.

## 2019-11-13 LAB — CBC
HCT: 33.6 % — ABNORMAL LOW (ref 36.0–46.0)
Hemoglobin: 11.3 g/dL — ABNORMAL LOW (ref 12.0–15.0)
MCH: 29.7 pg (ref 26.0–34.0)
MCHC: 33.6 g/dL (ref 30.0–36.0)
MCV: 88.4 fL (ref 80.0–100.0)
Platelets: 115 10*3/uL — ABNORMAL LOW (ref 150–400)
RBC: 3.8 MIL/uL — ABNORMAL LOW (ref 3.87–5.11)
RDW: 13.9 % (ref 11.5–15.5)
WBC: 3.1 10*3/uL — ABNORMAL LOW (ref 4.0–10.5)
nRBC: 0 % (ref 0.0–0.2)

## 2019-11-13 LAB — BASIC METABOLIC PANEL
Anion gap: 8 (ref 5–15)
BUN: 6 mg/dL (ref 6–20)
CO2: 25 mmol/L (ref 22–32)
Calcium: 9.1 mg/dL (ref 8.9–10.3)
Chloride: 104 mmol/L (ref 98–111)
Creatinine, Ser: 0.58 mg/dL (ref 0.44–1.00)
GFR calc Af Amer: >60 mL/min (ref >60)
GFR calc non Af Amer: >60 mL/min (ref >60)
Glucose, Bld: 114 mg/dL — ABNORMAL HIGH (ref 70–99)
Potassium: 4.7 mmol/L (ref 3.5–5.1)
Sodium: 137 mmol/L (ref 135–145)

## 2019-11-13 LAB — MAGNESIUM: Magnesium: 1.6 mg/dL — ABNORMAL LOW (ref 1.7–2.4)

## 2019-11-13 MED ORDER — PANTOPRAZOLE SODIUM 40 MG PO TBEC
40.0000 mg | DELAYED_RELEASE_TABLET | Freq: Every day | ORAL | 2 refills | Status: DC
Start: 2019-11-13 — End: 2020-10-31

## 2019-11-13 MED ORDER — ONDANSETRON 4 MG PO TBDP
4.0000 mg | ORAL_TABLET | Freq: Three times a day (TID) | ORAL | 0 refills | Status: DC | PRN
Start: 2019-11-13 — End: 2019-11-13

## 2019-11-13 MED ORDER — PROMETHAZINE HCL 25 MG PO TABS
25.0000 mg | ORAL_TABLET | Freq: Three times a day (TID) | ORAL | 0 refills | Status: DC | PRN
Start: 2019-11-13 — End: 2019-11-15

## 2019-11-13 MED ORDER — MAGNESIUM SULFATE 2 GM/50ML IV SOLN
2.0000 g | Freq: Once | INTRAVENOUS | Status: AC
  Administered 2019-11-13: 10:00:00 2 g via INTRAVENOUS
  Filled 2019-11-13: qty 50

## 2019-11-13 NOTE — Progress Notes (Signed)
Ch attempted visit, but Pt was asleep at this time. CH will attempt visit again later.

## 2019-11-13 NOTE — Progress Notes (Signed)
IV removed before discharge. Went over discharge instructions and medications with patient. All questions answered and patient stated that she understood. Patient going home POV.

## 2019-11-13 NOTE — Discharge Summary (Signed)
Physician Discharge Summary   Yvette [name]  female DOB: [birth year]  NOB:[id number]  PCP: Toy Cookey, FNP  Admit date: 11/09/2019 Discharge date: 11/13/2019  Admitted From: home Disposition:  home CODE STATUS: Full code   Hospital Course:  For full details, please see H&P, progress notes, consult notes and ancillary notes.  Briefly,  Yvette R Butleris a 39 y.o.AA femalewith medical history significant forasthma and alcohol abuse who presented to the emergency room for evaluation of nausea and vomiting that started about 6 PM the day prior to her admission and has been persistent since then. Patient admited to consuming a lot of alcohol prior to the onset of her symptoms.   # N/V, resolved # Diarrhea, resolved N/V worsened by fatty foods.  Seems more of a GI issue than alcohol withdrawal-related.  Started having profuse watery diarrhea a day after presentation which dropped her potassium level 2 days in a row, therefore pt was kept inpatient until her diarrhea had improved and pt no longer needed potassium supplementation.  Pt only received IVF on day 1, and since then was able to maintain hydration with oral intake.  Pt's diet was advanced, and was discharged on PRN phenergan.   # Hypokalemia 2/2 GI loss, resolved K+ dropped to 2.9 from 3.2 on 5/3 despite already having received repletion.  Likely due to GI loss from diarrhea.  Pt received aggressive potassium repletion with normalization of potassium level on the day of discharge.  Current everyday heavy alcohol user Transaminitis Secondary to alcohol use AST >> ALT.  Patient has been counseled on the need to abstain from further alcohol use, and pt herself wanted to quit drinking.  Pt was monitored on CIWA, however, showed no signs of alcohol withdrawal.   Elevated BP, resolved No hx of HTN and not on BP med at home.  Elevated BP likely due to stress from N/V.   Discharge Diagnoses:  Principal Problem:  Alcoholic gastritis Active Problems:   Alcohol use, daily   Elevated LFTs   Nausea & vomiting    Discharge Instructions:  Allergies as of 11/13/2019   No Known Allergies     Medication List    STOP taking these medications   ondansetron 4 MG disintegrating tablet Commonly known as: Zofran ODT     TAKE these medications   FLUoxetine 40 MG capsule Commonly known as: PROZAC Take 40 mg by mouth daily.   pantoprazole 40 MG tablet Commonly known as: Protonix Take 1 tablet (40 mg total) by mouth daily.   promethazine 25 MG tablet Commonly known as: PHENERGAN Take 1 tablet (25 mg total) by mouth every 8 (eight) hours as needed for nausea or vomiting.       Follow-up Information    Toy Cookey, FNP. Go on 11/22/2019.   Specialty: Family Medicine Why: at 11:20 a.m. Contact information: [address] [place] [address] [phone]           No Known Allergies   The results of significant diagnostics from this hospitalization (including imaging, microbiology, ancillary and laboratory) are listed below for reference.   Consultations:   Procedures/Studies: No results found.    Labs: BNP (last 3 results) No results for input(s): BNP in the last 8760 hours. Basic Metabolic Panel: Recent Labs  Lab 11/09/19 0034 11/09/19 0034 11/09/19 1132 11/10/19 0441 11/11/19 0444 11/12/19 0543 11/12/19 1758 11/13/19 0523  NA 139  --   --  139 139 135  --  137  K 4.3   < >  --  3.8 3.2* 2.9* 4.0 4.7  CL 102  --   --  111 107 104  --  104  CO2 18*  --   --  20* 18* 23  --  25  GLUCOSE 109*  --   --  76 79 113*  --  114*  BUN 9  --   --  8 6 8   --  6  CREATININE 0.59  --   --  0.57 0.73 0.68  --  0.58  CALCIUM 9.9  --   --  8.3* 8.9 8.7*  --  9.1  MG  --   --  1.4*  --  1.8 1.7  --  1.6*  PHOS  --   --  4.0  --   --   --   --   --    < > = values in this interval not displayed.   Liver Function Tests: Recent Labs  Lab 11/09/19 0034  AST 212*  ALT 82*    ALKPHOS 86  BILITOT 1.3*  PROT 8.8*  ALBUMIN 4.9   Recent Labs  Lab 11/09/19 0034  LIPASE 19   No results for input(s): AMMONIA in the last 168 hours. CBC: Recent Labs  Lab 11/09/19 0034 11/10/19 0441 11/11/19 0444 11/12/19 0543 11/13/19 0523  WBC 10.8* 5.0 5.8 5.1 3.1*  HGB 12.8 9.6* 11.3* 12.4 11.3*  HCT 37.3 28.1* 33.2* 36.3 33.6*  MCV 88.0 88.9 88.1 86.4 88.4  PLT 148* 103* 101* 118* 115*   Cardiac Enzymes: No results for input(s): CKTOTAL, CKMB, CKMBINDEX, TROPONINI in the last 168 hours. BNP: Invalid input(s): POCBNP CBG: No results for input(s): GLUCAP in the last 168 hours. D-Dimer No results for input(s): DDIMER in the last 72 hours. Hgb A1c No results for input(s): HGBA1C in the last 72 hours. Lipid Profile No results for input(s): CHOL, HDL, LDLCALC, TRIG, CHOLHDL, LDLDIRECT in the last 72 hours. Thyroid function studies No results for input(s): TSH, T4TOTAL, T3FREE, THYROIDAB in the last 72 hours.  Invalid input(s): FREET3 Anemia work up No results for input(s): VITAMINB12, FOLATE, FERRITIN, TIBC, IRON, RETICCTPCT in the last 72 hours. Urinalysis    Component Value Date/Time   COLORURINE YELLOW (A) 11/09/2019 0240   APPEARANCEUR CLEAR (A) 11/09/2019 0240   LABSPEC 1.020 11/09/2019 0240   PHURINE 6.0 11/09/2019 0240   GLUCOSEU NEGATIVE 11/09/2019 0240   HGBUR SMALL (A) 11/09/2019 0240   BILIRUBINUR NEGATIVE 11/09/2019 0240   KETONESUR 80 (A) 11/09/2019 0240   PROTEINUR 100 (A) 11/09/2019 0240   NITRITE NEGATIVE 11/09/2019 0240   LEUKOCYTESUR NEGATIVE 11/09/2019 0240   Sepsis Labs Invalid input(s): PROCALCITONIN,  WBC,  LACTICIDVEN Microbiology Recent Results (from the past 240 hour(s))  Respiratory Panel by RT PCR (Flu A&B, Covid) - Nasopharyngeal Swab     Status: None   Collection Time: 11/09/19 11:32 AM   Specimen: Nasopharyngeal Swab  Result Value Ref Range Status   SARS Coronavirus 2 by RT PCR NEGATIVE NEGATIVE Final    Comment:  (NOTE) SARS-CoV-2 target nucleic acids are NOT DETECTED. The SARS-CoV-2 RNA is generally detectable in upper respiratoy specimens during the acute phase of infection. The lowest concentration of SARS-CoV-2 viral copies this assay can detect is 131 copies/mL. A negative result does not preclude SARS-Cov-2 infection and should not be used as the sole basis for treatment or other patient management decisions. A negative result may occur with  improper specimen collection/handling, submission of specimen other than nasopharyngeal swab, presence of viral mutation(s)  within the areas targeted by this assay, and inadequate number of viral copies (<131 copies/mL). A negative result must be combined with clinical observations, patient history, and epidemiological information. The expected result is Negative. Fact Sheet for Patients:  PinkCheek.be Fact Sheet for Healthcare Providers:  GravelBags.it This test is not yet ap proved or cleared by the [place] FDA and  has been authorized for detection and/or diagnosis of SARS-CoV-2 by FDA under an Emergency Use Authorization (EUA). This EUA will remain  in effect (meaning this test can be used) for the duration of the COVID-19 declaration under Section 564(b)(1) of the Act, 21 U.S.C. section 360bbb-3(b)(1), unless the authorization is terminated or revoked sooner.    Influenza A by PCR NEGATIVE NEGATIVE Final   Influenza B by PCR NEGATIVE NEGATIVE Final    Comment: (NOTE) The Xpert Xpress SARS-CoV-2/FLU/RSV assay is intended as an aid in  the diagnosis of influenza from Nasopharyngeal swab specimens and  should not be used as a sole basis for treatment. Nasal washings and  aspirates are unacceptable for Xpert Xpress SARS-CoV-2/FLU/RSV  testing. Fact Sheet for Patients: PinkCheek.be Fact Sheet for Healthcare  Providers: GravelBags.it This test is not yet approved or cleared by the [place] FDA and  has been authorized for detection and/or diagnosis of SARS-CoV-2 by  FDA under an Emergency Use Authorization (EUA). This EUA will remain  in effect (meaning this test can be used) for the duration of the  Covid-19 declaration under Section 564(b)(1) of the Act, 21  U.S.C. section 360bbb-3(b)(1), unless the authorization is  terminated or revoked. Performed at [facility], [address] [place], [place] [address]      Total time spend on discharging this patient, including the last patient exam, discussing the hospital stay, instructions for ongoing care as it relates to all pertinent caregivers, as well as preparing the medical discharge records, prescriptions, and/or referrals as applicable, is 35 minutes.    Enzo Bi, MD  [facility] 11/13/2019, 10:54 AM  If 7PM-7AM, please contact night-coverage

## 2019-11-15 ENCOUNTER — Emergency Department
Admission: EM | Admit: 2019-11-15 | Discharge: 2019-11-15 | Disposition: A | Discharge status: Home / Self Care | Payer: Medicaid Other | Attending: Student in an Organized Health Care Education/Training Program | Admitting: Student in an Organized Health Care Education/Training Program

## 2019-11-15 ENCOUNTER — Other Ambulatory Visit: Payer: Self-pay

## 2019-11-15 DIAGNOSIS — Z79899 Other long term (current) drug therapy: Secondary | ICD-10-CM | POA: Insufficient documentation

## 2019-11-15 DIAGNOSIS — R1111 Vomiting without nausea: Secondary | ICD-10-CM

## 2019-11-15 DIAGNOSIS — F17210 Nicotine dependence, cigarettes, uncomplicated: Secondary | ICD-10-CM | POA: Insufficient documentation

## 2019-11-15 DIAGNOSIS — R112 Nausea with vomiting, unspecified: Secondary | ICD-10-CM | POA: Diagnosis present

## 2019-11-15 DIAGNOSIS — J45909 Unspecified asthma, uncomplicated: Secondary | ICD-10-CM | POA: Diagnosis not present

## 2019-11-15 LAB — CBC WITH DIFFERENTIAL/PLATELET
Abs Immature Granulocytes: 0.02 10*3/uL (ref 0.00–0.07)
Basophils Absolute: 0 10*3/uL (ref 0.0–0.1)
Basophils Relative: 0 %
Eosinophils Absolute: 0 10*3/uL (ref 0.0–0.5)
Eosinophils Relative: 0 %
HCT: 34 % — ABNORMAL LOW (ref 36.0–46.0)
Hemoglobin: 11.3 g/dL — ABNORMAL LOW (ref 12.0–15.0)
Immature Granulocytes: 1 %
Lymphocytes Relative: 6 %
Lymphs Abs: 0.3 10*3/uL — ABNORMAL LOW (ref 0.7–4.0)
MCH: 29.7 pg (ref 26.0–34.0)
MCHC: 33.2 g/dL (ref 30.0–36.0)
MCV: 89.2 fL (ref 80.0–100.0)
Monocytes Absolute: 0.4 10*3/uL (ref 0.1–1.0)
Monocytes Relative: 9 %
Neutro Abs: 3.5 10*3/uL (ref 1.7–7.7)
Neutrophils Relative %: 84 %
Platelets: 160 10*3/uL (ref 150–400)
RBC: 3.81 MIL/uL — ABNORMAL LOW (ref 3.87–5.11)
RDW: 14.6 % (ref 11.5–15.5)
WBC: 4.1 10*3/uL (ref 4.0–10.5)
nRBC: 0 % (ref 0.0–0.2)

## 2019-11-15 LAB — BASIC METABOLIC PANEL
Anion gap: 12 (ref 5–15)
BUN: 8 mg/dL (ref 6–20)
CO2: 25 mmol/L (ref 22–32)
Calcium: 9.4 mg/dL (ref 8.9–10.3)
Chloride: 101 mmol/L (ref 98–111)
Creatinine, Ser: 0.49 mg/dL (ref 0.44–1.00)
GFR calc Af Amer: >60 mL/min (ref >60)
GFR calc non Af Amer: >60 mL/min (ref >60)
Glucose, Bld: 129 mg/dL — ABNORMAL HIGH (ref 70–99)
Potassium: 3.8 mmol/L (ref 3.5–5.1)
Sodium: 138 mmol/L (ref 135–145)

## 2019-11-15 MED ORDER — METOCLOPRAMIDE HCL 10 MG PO TABS: 10.0000 mg | ORAL_TABLET | Freq: Once | ORAL | Status: DC

## 2019-11-15 MED ORDER — PROMETHAZINE HCL 25 MG RE SUPP: 25.0000 mg | Freq: Four times a day (QID) | RECTAL | 1 refills | Status: DC | PRN

## 2019-11-15 MED ORDER — SODIUM CHLORIDE 0.9% FLUSH
10.0000 mL | Freq: Once | INTRAVENOUS | Status: AC
  Administered 2019-11-15: 10 mL via INTRAVENOUS

## 2019-11-15 MED ORDER — METOCLOPRAMIDE HCL 5 MG/ML IJ SOLN
10.0000 mg | Freq: Once | INTRAMUSCULAR | Status: AC
  Administered 2019-11-15: 10 mg via INTRAVENOUS
  Filled 2019-11-15: qty 2

## 2019-11-15 MED ORDER — SODIUM CHLORIDE 0.9 % IV BOLUS
1000.0000 mL | Freq: Once | INTRAVENOUS | Status: AC
  Administered 2019-11-15: 1000 mL via INTRAVENOUS

## 2019-11-15 MED ORDER — DROPERIDOL 2.5 MG/ML IJ SOLN: 1.2500 mg | Freq: Once | INTRAMUSCULAR | Status: DC

## 2019-11-15 MED ORDER — DICYCLOMINE HCL 10 MG PO CAPS
10.0000 mg | ORAL_CAPSULE | Freq: Once | ORAL | Status: AC
  Administered 2019-11-15: 10 mg via ORAL
  Filled 2019-11-15: qty 1

## 2019-11-15 MED ORDER — PROMETHAZINE HCL 25 MG/ML IJ SOLN
25.0000 mg | Freq: Once | INTRAMUSCULAR | Status: AC
  Administered 2019-11-15: 25 mg via INTRAMUSCULAR
  Filled 2019-11-15: qty 1

## 2019-11-15 MED ORDER — PANTOPRAZOLE SODIUM 40 MG PO TBEC
40.0000 mg | DELAYED_RELEASE_TABLET | Freq: Once | ORAL | Status: AC
  Administered 2019-11-15: 40 mg via ORAL
  Filled 2019-11-15: qty 1

## 2019-11-15 MED ORDER — METOCLOPRAMIDE HCL 5 MG/ML IJ SOLN: 10.0000 mg | Freq: Once | INTRAMUSCULAR | Status: DC

## 2019-11-15 NOTE — ED Notes (Signed)
See triage note  Presents with some n/v  States she was given Zofran per EMS  But is requesting phenergan

## 2019-11-15 NOTE — ED Triage Notes (Signed)
Pt in via EMS from home with c/o NV. EMS reports no production with the emesis. Pt was seen here Saturday and discharged home on Sunday for the same. Pt reports did not know she had HTN but her BP 164/97. Pt was given 4mg  zofran IM en route

## 2019-11-15 NOTE — Discharge Instructions (Addendum)
Your exam is normal at this time. Take the nausea medicine as prescribed by your provider. Return as needed.

## 2019-11-15 NOTE — ED Triage Notes (Signed)
Pt comes via [facility] from home with c/o N/V. Pt states this just started a few hours ago. Pt denies any abdominal pain.  EMS gave zofran in route.

## 2019-11-15 NOTE — ED Notes (Signed)
Pt asking for more phenergan and more medicine. Pt told that EDP will not give more medicine at this time. Pt asking to be able to stay in room longer, pt informed that this section of the ER is closing down and that she is discharged. Pt called her husband while this RN in room, husband to get a ride for pt. Pt states she needs to call him back in 10 minutes, pt wheeled to courtesy phone in lobby.

## 2019-11-15 NOTE — ED Provider Notes (Signed)
[facility] Emergency Department Provider Note ____________________________________________  Time seen: 1757  I have reviewed the triage vital signs and the nursing notes.  HISTORY  Chief Complaint  Nausea and Emesis  HPI Yvette [name] is a 39 y.o. female presents to the ED via EMS from home, for complaints of nausea and vomiting.  Patient was discharged from the ED observation status 2 days prior for similar complaints.  Her work-up was overall benign reassuring at that time.  She was discharged with a normal potassium the vital signs and labs otherwise within normal limits.   Patient presents today stating she was only able to pick up her prescriptions for promethazine and pantoprazole today. She reports vomiting just after dosing the meds.  She denies any fever, chills, or sweats.  Patient presents with episodic nonbilious, nonbloody nausea and vomiting for evaluation management.  Past Medical History:  Diagnosis Date  . Asthma   . 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 History of anemia   . History of blood transfusion 1984  . Leukopenia   . Neutropenia [facility])     Patient Active Problem List   Diagnosis Date Noted  . Nausea & vomiting 11/10/2019  . Alcoholic gastritis 48/54/6270  . Intractable vomiting 10/01/2019  . Alcohol use, daily 09/30/2019  . Elevated LFTs 09/30/2019  . Thrombocytopenia ([place]) 09/30/2019  . Hypokalemia   . Intractable vomiting with nausea 08/23/2019  . Dehydration   . Food poisoning   . Other neutropenia ([place]) 06/11/2016  . Leukopenia 02/03/2015    History reviewed. No pertinent surgical history.  Prior to Admission medications   Medication Sig Start Date End Date Taking? Authorizing Provider  FLUoxetine (PROZAC) 40 MG capsule Take 40 mg by mouth daily.    [provider]  pantoprazole (PROTONIX) 40 MG tablet Take 1 tablet (40 mg total) by mouth daily. 11/13/19 02/11/20  Enzo Bi, MD   promethazine (PHENERGAN) 25 MG tablet Take 1 tablet (25 mg total) by mouth every 8 (eight) hours as needed for nausea or vomiting. 11/13/19   Enzo Bi, MD    Allergies Patient has no known allergies.  Family History  Problem Relation Age of Onset  . Lupus Mother   . Cancer Paternal Aunt        Breast   . Cancer Paternal Aunt        Breast     Social History Social History   Tobacco Use  . Smoking status: Current Every Day Smoker    Packs/day: 0.20    Years: 15.00    Pack years: 3.00    Types: Cigarettes  . Smokeless tobacco: Never Used  Substance Use Topics  . Alcohol use: Yes    Alcohol/week: 61.0 standard drinks    Types: 21 Cans of beer, [name]  . Drug use: No    Review of Systems  Constitutional: Negative for fever. Eyes: Negative for visual changes. ENT: Negative for sore throat. Cardiovascular: Negative for chest pain. Respiratory: Negative for shortness of breath. Gastrointestinal: Negative for abdominal pain, and diarrhea. Reports nausea & vomiting Genitourinary: Negative for dysuria. Musculoskeletal: Negative for back pain. Skin: Negative for rash. Neurological: Negative for headaches, focal weakness or numbness. ____________________________________________  PHYSICAL EXAM:  VITAL SIGNS: ED Triage Vitals  Enc Vitals Group     BP 11/15/19 1657 (!) 141/87     Pulse Rate 11/15/19  1657 70     Resp 11/15/19 1657 18     Temp 11/15/19 1657 98.1 F (36.7 C)     Temp Source 11/15/19 2015 Oral     SpO2 11/15/19 1657 100 %     Weight 11/15/19 1657 101 lb (45.8 kg)     Height 11/15/19 1657 5\' 1"  (1.549 m)     Head Circumference --      Peak Flow --      Pain Score 11/15/19 1657 0     Pain Loc --      Pain Edu? --      Excl. in GC? --     Constitutional: Alert and oriented. Well appearing and in no distress. Head: Normocephalic and atraumatic. Eyes: Conjunctivae are normal. Normal extraocular  movements Cardiovascular: Normal rate, regular rhythm. Normal distal pulses. Respiratory: Normal respiratory effort. No wheezes/rales/rhonchi. Gastrointestinal: Soft and nontender. No distention, rebound, guarding, or rigidity.  Normoactive bowel sounds x4.  No CVA tenderness elicited. Musculoskeletal: Nontender with normal range of motion in all extremities.  Neurologic:  Normal gait without ataxia. Normal speech and language. No gross focal neurologic deficits are appreciated. Skin:  Skin is warm, dry and intact. No rash noted. Psychiatric: Mood and affect are normal. Patient exhibits appropriate insight and judgment. ____________________________________________   LABS (pertinent positives/negatives) Labs Reviewed  CBC WITH DIFFERENTIAL/PLATELET - Abnormal; Notable for the following components:      Result Value   RBC 3.81 (*)    Hemoglobin 11.3 (*)    HCT 34.0 (*)    Lymphs Abs 0.3 (*)    All other components within normal limits  BASIC METABOLIC PANEL - Abnormal; Notable for the following components:   Glucose, Bld 129 (*)    All other components within normal limits  ____________________________________________  EKG  NSR Prolonged QT Interval ____________________________________________  PROCEDURES  Phenergan 25 mg IM Reglan 20 mg IVP Bentyl 20 mg PO Pantoprazole 40 mg PO NS 1000 ml bolus  Procedures ____________________________________________  INITIAL IMPRESSION / ASSESSMENT AND PLAN / ED COURSE  Patient with ED evaluation of nausea & vomiting. Her VS are stable, and her labs are WNL. No signs of acute dehydration, infection, or intractable emesis. She has not had a single episode of dry heaving/wretching during her course. She has presented 2 bags of nonbloody/nonbilious emesis since arrival. She has been treated with Zofran PO via EMS, promethazine IM, metoclopramide IV, and dicyclomine & pantoprazole PO. She is requesting admission, but does not meet any admission  criteria. She will be held in the ED for fluid hydration and discharged when fluids are complete.   ----------------------------------------- 10:14 PM on 11/15/2019 ----------------------------------------- Patient appears comfortable and drowsy in the room. She is updated on the current plan and transfer of care to C. Triplett, NP-C.   Yvette [name] was evaluated in Emergency Department on 11/15/2019 for the symptoms described in the history of present illness. She was evaluated in the context of the global COVID-19 pandemic, which necessitated consideration that the patient might be at risk for infection with the SARS-CoV-2 virus that causes COVID-19. Institutional protocols and algorithms that pertain to the evaluation of patients at risk for COVID-19 are in a state of rapid change based on information released by regulatory bodies including the CDC and federal and state organizations. These policies and algorithms were followed during the patient's care in the ED. ____________________________________________  FINAL CLINICAL IMPRESSION(S) / ED DIAGNOSES  Final diagnoses:  Non-intractable vomiting without nausea, unspecified vomiting type  Lissa Hoard, PA-C 11/15/19 2215    Willy Eddy, MD 11/17/19 1504

## 2019-12-18 ENCOUNTER — Ambulatory Visit: Payer: Medicaid Other | Admitting: Gastroenterology

## 2019-12-19 ENCOUNTER — Other Ambulatory Visit: Payer: Medicaid Other

## 2019-12-19 ENCOUNTER — Ambulatory Visit: Payer: Medicaid Other | Admitting: Internal Medicine

## 2020-01-01 ENCOUNTER — Other Ambulatory Visit: Payer: Self-pay

## 2020-01-01 ENCOUNTER — Ambulatory Visit (INDEPENDENT_AMBULATORY_CARE_PROVIDER_SITE_OTHER): Payer: Medicaid Other | Admitting: Gastroenterology

## 2020-01-01 ENCOUNTER — Encounter: Payer: Self-pay | Admitting: Gastroenterology

## 2020-01-01 VITALS — BP 137/88 | HR 84 | Temp 98.1°F | Wt 100.6 lb

## 2020-01-01 DIAGNOSIS — R748 Abnormal levels of other serum enzymes: Secondary | ICD-10-CM

## 2020-01-01 DIAGNOSIS — R7989 Other specified abnormal findings of blood chemistry: Secondary | ICD-10-CM

## 2020-01-01 NOTE — Progress Notes (Signed)
Yvette Bouillon, MD [address]  [address]  [place], [place] [address]  Main: [phone]  Fax: [phone]   Primary Care Physician: Yvette Cookey, FNP   Chief Complaint  Patient presents with  . Follow-up  . Gastroesophageal Reflux    Patient stated that she had been doing better.    HPI: Yvette [name] is a 39 y.o. female previously seen for nausea vomiting, reflux presents for follow-up.  It was also noted that patient had thrombocytopenia and elevated liver enzymes chronically, but ultrasound showing increased hepatic parenchymal echogenicity on previous visit and cirrhosis was suspected.  Patient also reported daily alcohol use.  Continues to drink alcohol daily.  EGD was recommended on last visit but patient did not schedule.  Labs were also ordered but patient did not have these done.  Patient states when she ran out of her PPI, she had nausea vomiting and heartburn.  However, as long as she is on this medication she does not have symptoms.  Current Outpatient Medications  Medication Sig Dispense Refill  . FLUoxetine (PROZAC) 40 MG capsule Take 40 mg by mouth daily.    . pantoprazole (PROTONIX) 40 MG tablet Take 1 tablet (40 mg total) by mouth daily. 30 tablet 2  . promethazine (PHENERGAN) 25 MG suppository Place 1 suppository (25 mg total) rectally every 6 (six) hours as needed for nausea. 12 suppository 1   No current facility-administered medications for this visit.    Allergies as of 01/01/2020  . (No Known Allergies)    ROS:  General: Negative for anorexia, weight loss, fever, chills, fatigue, weakness. ENT: Negative for hoarseness, difficulty swallowing , nasal congestion. CV: Negative for chest pain, angina, palpitations, dyspnea on exertion, peripheral edema.  Respiratory: Negative for dyspnea at rest, dyspnea on exertion, cough, sputum, wheezing.  GI: See history of present illness. GU:  Negative for dysuria, hematuria, urinary incontinence,  urinary frequency, nocturnal urination.  Endo: Negative for unusual weight change.    Physical Examination:   BP 137/88   Pulse 84   Temp 98.1 F (36.7 C) (Oral)   Wt 100 lb 9.6 oz (45.6 kg)   BMI 19.01 kg/m   General: Well-nourished, well-developed in no acute distress.  Eyes: No icterus. Conjunctivae pink. Mouth: Oropharyngeal mucosa moist and pink , no lesions erythema or exudate. Neck: Supple, Trachea midline Abdomen: Bowel sounds are normal, nontender, nondistended, no hepatosplenomegaly or masses, no abdominal bruits or hernia , no rebound or guarding.   Extremities: No lower extremity edema. No clubbing or deformities. Neuro: Alert and oriented x 3.  Grossly intact. Skin: Warm and dry, no jaundice.   Psych: Alert and cooperative, normal mood and affect.   Labs: CMP     Component Value Date/Time   NA 138 11/15/2019 2054   K 3.8 11/15/2019 2054   CL 101 11/15/2019 2054   CO2 25 11/15/2019 2054   GLUCOSE 129 (H) 11/15/2019 2054   BUN 8 11/15/2019 2054   CREATININE 0.49 11/15/2019 2054   CALCIUM 9.4 11/15/2019 2054   PROT 8.8 (H) 11/09/2019 0034   ALBUMIN 4.9 11/09/2019 0034   AST 212 (H) 11/09/2019 0034   ALT 82 (H) 11/09/2019 0034   ALKPHOS 86 11/09/2019 0034   BILITOT 1.3 (H) 11/09/2019 0034   GFRNONAA >60 11/15/2019 2054   GFRAA >60 11/15/2019 2054   Lab Results  Component Value Date   WBC 4.1 11/15/2019   HGB 11.3 (L) 11/15/2019   HCT 34.0 (L) 11/15/2019  MCV 89.2 11/15/2019   PLT 160 11/15/2019    Imaging Studies: No results found.  Assessment and Plan:   Yvette [name] is a 39 y.o. y/o female with daily alcohol abuse 3 years, chronically elevated liver enzymes, reflux, here for follow-up  I have asked the patient to get blood work done that was previously ordered  Clinical findings suggestive of cirrhosis discussed in detail.  She was asked to come abstain from alcohol abuse, or the risk of complications from cirrhosis/decompensation can  occur.  She verbalized understanding  EGD indicated due to reflux, nausea vomiting, and variceal screening   Dr Vonda Antigua

## 2020-01-02 ENCOUNTER — Telehealth: Payer: Self-pay

## 2020-01-02 LAB — PROTIME-INR
INR: 1 (ref 0.9–1.2)
Prothrombin Time: 11 s (ref 9.1–12.0)

## 2020-01-02 LAB — HEPATIC FUNCTION PANEL
ALT: 65 IU/L — ABNORMAL HIGH (ref 0–32)
AST: 320 IU/L — ABNORMAL HIGH (ref 0–40)
Albumin: 4.8 g/dL (ref 3.8–4.8)
Alkaline Phosphatase: 157 IU/L — ABNORMAL HIGH (ref 48–121)
Bilirubin Total: 0.5 mg/dL (ref 0.0–1.2)
Bilirubin, Direct: 0.17 mg/dL (ref 0.00–0.40)
Total Protein: 7.9 g/dL (ref 6.0–8.5)

## 2020-01-02 NOTE — Telephone Encounter (Signed)
-----   Message from Pasty Spillers, MD sent at 01/02/2020  8:29 AM EDT ----- Yvette [name] please let the patient know, her blood work shows increasing liver enzymes. The rest of her bloodwork is still pending. She would need to stop all and any alcohol use for this to get better otherwise this is likely to continue to get worse. She should not take any tylenol or any over the counter herbal products or supplements without talking to her PCP

## 2020-01-02 NOTE — Telephone Encounter (Signed)
Called patient to give her the following lab results. Patient had no questions and agreed with Dr. Michele Mcalpine recommendations.

## 2020-01-09 ENCOUNTER — Other Ambulatory Visit: Admission: RE | Admit: 2020-01-09 | Payer: Medicaid Other | Source: Ambulatory Visit

## 2020-01-09 ENCOUNTER — Other Ambulatory Visit: Payer: Self-pay

## 2020-01-09 DIAGNOSIS — K219 Gastro-esophageal reflux disease without esophagitis: Secondary | ICD-10-CM

## 2020-01-09 DIAGNOSIS — K259 Gastric ulcer, unspecified as acute or chronic, without hemorrhage or perforation: Secondary | ICD-10-CM

## 2020-01-10 ENCOUNTER — Telehealth: Payer: Self-pay

## 2020-01-10 NOTE — Telephone Encounter (Signed)
Rosey Bath from [employer] called letting us know that patient's Medicaid is out of network, therefore, patient will need to call her social worker to change her insurance to the ones that are in-network so her procedure could be covered. I then call patient to let her know and she stated that she had spoken to Driggs from [employer] and she told her what to do and that she was going to take care of it. I told patient that if she did not call to change it, then she will have to pay the procedure in full, so once she had taken care of it, to call us back. Patient agreed.

## 2020-01-11 ENCOUNTER — Encounter: Admission: RE | Payer: Self-pay | Source: Home / Self Care

## 2020-01-11 ENCOUNTER — Ambulatory Visit: Admission: RE | Admit: 2020-01-11 | Payer: Medicaid Other | Source: Home / Self Care | Admitting: Gastroenterology

## 2020-01-11 SURGERY — ESOPHAGOGASTRODUODENOSCOPY (EGD) WITH PROPOFOL
Anesthesia: General

## 2020-01-15 ENCOUNTER — Other Ambulatory Visit: Payer: Medicaid Other | Attending: Gastroenterology

## 2020-01-15 ENCOUNTER — Telehealth: Payer: Self-pay

## 2020-01-16 NOTE — Telephone Encounter (Signed)
Called patient to reschedule her colonoscopy per patient's request when she called Trish from Endoscopy unit. However, I had to leave her a voicemail to call me back so we could reschedule. Now awaiting for patient to call back.

## 2020-01-17 ENCOUNTER — Encounter: Admission: RE | Payer: Self-pay | Source: Home / Self Care

## 2020-01-17 ENCOUNTER — Ambulatory Visit: Admission: RE | Admit: 2020-01-17 | Payer: Medicaid Other | Source: Home / Self Care | Admitting: Gastroenterology

## 2020-01-17 SURGERY — ESOPHAGOGASTRODUODENOSCOPY (EGD) WITH PROPOFOL
Anesthesia: General

## 2020-01-18 ENCOUNTER — Telehealth: Payer: Self-pay

## 2020-01-18 LAB — NASH FIBROSURE
ALPHA 2-MACROGLOBULINS, QN: 284 mg/dL — ABNORMAL HIGH (ref 110–276)
ALT (SGPT) P5P: 79 IU/L — ABNORMAL HIGH (ref 0–40)
AST (SGOT) P5P: 378 IU/L — ABNORMAL HIGH (ref 0–40)
Apolipoprotein A-1: 276 mg/dL — ABNORMAL HIGH (ref 116–209)
Bilirubin, Total: 0.4 mg/dL (ref 0.0–1.2)
Cholesterol, Total: 261 mg/dL — ABNORMAL HIGH (ref 100–199)
GGT: 635 IU/L — ABNORMAL HIGH (ref 0–60)
Glucose: 76 mg/dL (ref 65–99)
Haptoglobin: 167 mg/dL (ref 33–278)
Height: 63 in
Triglycerides: 178 mg/dL — ABNORMAL HIGH (ref 0–149)
Weight: 100 [lb_av]

## 2020-01-18 NOTE — Telephone Encounter (Signed)
-----   Message from Pasty Spillers, MD sent at 01/17/2020 11:50 AM EDT ----- Kandis Cocking, please see below lab comment about fibrosis score.  Please call them at their number listed to give them height, weight information so they can calculate fibrosis score. ----- Message ----- From: Interface, [facility] Lab Results In Sent: 01/03/2020   7:39 AM EDT To: Pasty Spillers, MD

## 2020-01-18 NOTE — Telephone Encounter (Signed)
Called [facility] to give them the information they needed so they could calculate patient's  Fibrosis, Steatosis, and NASH scores. They stated that they would calculate and that they will send [place] the scores in a couple of hours. Dr. Maximino Greenland will be informed.

## 2020-01-23 ENCOUNTER — Telehealth: Payer: Self-pay

## 2020-01-23 NOTE — Telephone Encounter (Signed)
Tried to call patient number and number is disconnect. Called patient father number and left a message for call back

## 2020-01-23 NOTE — Telephone Encounter (Signed)
error 

## 2020-01-23 NOTE — Telephone Encounter (Signed)
-----   Message from Pasty Spillers, MD sent at 01/22/2020 10:57 AM EDT ----- Kandis Cocking please let the patient know, the blood test was inconclusive. However, we need to continue follow up in clinic. Follow up in 8 weeks

## 2020-01-23 NOTE — Telephone Encounter (Signed)
Patient called and states she can be reached today at [phone] to be given further lab results.

## 2020-01-23 NOTE — Telephone Encounter (Signed)
Called patient back and was not able to leave her a voicemail.

## 2020-01-24 NOTE — Telephone Encounter (Signed)
Called patient yesterday and today and was not able to get in contact with her. I left her a voicemail to call us back.

## 2020-02-20 ENCOUNTER — Telehealth: Payer: Self-pay

## 2020-02-20 ENCOUNTER — Other Ambulatory Visit: Payer: Self-pay

## 2020-02-20 DIAGNOSIS — R748 Abnormal levels of other serum enzymes: Secondary | ICD-10-CM

## 2020-02-20 NOTE — Telephone Encounter (Signed)
Patient called stating that she wanted to reschedule her EGD. After going back and forth with several dates, she agreed on 03/14/2020. Patient was given instructions and I also told her that I would be mailing them for her to review. Patient was informed that on 03/12/2020 she would have to take a COVID-19 test and patient agreed.

## 2020-03-12 ENCOUNTER — Other Ambulatory Visit: Payer: Self-pay

## 2020-03-12 ENCOUNTER — Other Ambulatory Visit
Admission: RE | Admit: 2020-03-12 | Discharge: 2020-03-12 | Disposition: A | Discharge status: Home / Self Care | Payer: Medicaid Other | Source: Ambulatory Visit | Attending: Gastroenterology | Admitting: Gastroenterology

## 2020-03-12 DIAGNOSIS — Z01812 Encounter for preprocedural laboratory examination: Secondary | ICD-10-CM | POA: Insufficient documentation

## 2020-03-12 DIAGNOSIS — Z20822 Contact with and (suspected) exposure to covid-19: Secondary | ICD-10-CM | POA: Insufficient documentation

## 2020-03-12 LAB — SARS CORONAVIRUS 2 (TAT 6-24 HRS): SARS Coronavirus 2: NEGATIVE

## 2020-03-14 ENCOUNTER — Ambulatory Visit: Payer: Medicaid Other | Admitting: Anesthesiology

## 2020-03-14 ENCOUNTER — Encounter: Payer: Self-pay | Admitting: Gastroenterology

## 2020-03-14 ENCOUNTER — Other Ambulatory Visit: Payer: Self-pay

## 2020-03-14 ENCOUNTER — Ambulatory Visit
Admission: RE | Admit: 2020-03-14 | Discharge: 2020-03-14 | Disposition: A | Discharge status: Home / Self Care | Payer: Medicaid Other | Attending: Gastroenterology | Admitting: Gastroenterology

## 2020-03-14 ENCOUNTER — Encounter
Admission: RE | Disposition: A | Discharge status: Home / Self Care | Payer: Self-pay | Source: Home / Self Care | Attending: Gastroenterology

## 2020-03-14 DIAGNOSIS — K449 Diaphragmatic hernia without obstruction or gangrene: Secondary | ICD-10-CM

## 2020-03-14 DIAGNOSIS — Z79899 Other long term (current) drug therapy: Secondary | ICD-10-CM | POA: Diagnosis not present

## 2020-03-14 DIAGNOSIS — F17210 Nicotine dependence, cigarettes, uncomplicated: Secondary | ICD-10-CM | POA: Diagnosis not present

## 2020-03-14 DIAGNOSIS — J45909 Unspecified asthma, uncomplicated: Secondary | ICD-10-CM | POA: Diagnosis not present

## 2020-03-14 DIAGNOSIS — K7460 Unspecified cirrhosis of liver: Secondary | ICD-10-CM | POA: Insufficient documentation

## 2020-03-14 DIAGNOSIS — K228 Other specified diseases of esophagus: Secondary | ICD-10-CM | POA: Insufficient documentation

## 2020-03-14 DIAGNOSIS — K3189 Other diseases of stomach and duodenum: Secondary | ICD-10-CM | POA: Insufficient documentation

## 2020-03-14 DIAGNOSIS — K209 Esophagitis, unspecified without bleeding: Secondary | ICD-10-CM | POA: Insufficient documentation

## 2020-03-14 DIAGNOSIS — K2950 Unspecified chronic gastritis without bleeding: Secondary | ICD-10-CM | POA: Diagnosis not present

## 2020-03-14 DIAGNOSIS — R112 Nausea with vomiting, unspecified: Secondary | ICD-10-CM | POA: Diagnosis not present

## 2020-03-14 DIAGNOSIS — K7030 Alcoholic cirrhosis of liver without ascites: Secondary | ICD-10-CM

## 2020-03-14 DIAGNOSIS — K766 Portal hypertension: Secondary | ICD-10-CM

## 2020-03-14 DIAGNOSIS — R748 Abnormal levels of other serum enzymes: Secondary | ICD-10-CM

## 2020-03-14 HISTORY — PX: ESOPHAGOGASTRODUODENOSCOPY (EGD) WITH PROPOFOL: SHX5813

## 2020-03-14 LAB — KOH PREP
KOH Prep: NONE SEEN
Special Requests: NORMAL

## 2020-03-14 LAB — POCT PREGNANCY, URINE: Preg Test, Ur: NEGATIVE

## 2020-03-14 SURGERY — ESOPHAGOGASTRODUODENOSCOPY (EGD) WITH PROPOFOL
Anesthesia: General

## 2020-03-14 MED ORDER — PROPOFOL 500 MG/50ML IV EMUL
INTRAVENOUS | Status: AC
  Filled 2020-03-14: qty 50

## 2020-03-14 MED ORDER — LIDOCAINE HCL (PF) 2 % IJ SOLN
INTRAMUSCULAR | Status: AC
  Filled 2020-03-14: qty 5

## 2020-03-14 MED ORDER — LIDOCAINE HCL (CARDIAC) PF 100 MG/5ML IV SOSY
PREFILLED_SYRINGE | INTRAVENOUS | Status: DC | PRN
  Administered 2020-03-14: 25 mg via INTRAVENOUS

## 2020-03-14 MED ORDER — PROPOFOL 500 MG/50ML IV EMUL
INTRAVENOUS | Status: DC | PRN
  Administered 2020-03-14: 125 ug/kg/min via INTRAVENOUS

## 2020-03-14 MED ORDER — PROPOFOL 10 MG/ML IV BOLUS
INTRAVENOUS | Status: DC | PRN
  Administered 2020-03-14 (×2): 50 mg via INTRAVENOUS
  Administered 2020-03-14: 100 mg via INTRAVENOUS
  Administered 2020-03-14 (×3): 50 mg via INTRAVENOUS

## 2020-03-14 MED ORDER — SODIUM CHLORIDE 0.9 % IV SOLN: INTRAVENOUS | Status: DC

## 2020-03-14 NOTE — H&P (Signed)
[name], MD [address], [address], [place], [place], [address] [address], [place], [place], [address] Phone: [phone]  Fax: [phone]  Primary Care Physician:  [facility]   Pre-Procedure History & Physical: HPI:  Yvette [name] is a 39 y.o. female is here for an EGD.   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 No pertinent surgical history.  Prior to Admission medications   Medication Sig Start Date End Date Taking? Authorizing Provider  FLUoxetine (PROZAC) 40 MG capsule Take 40 mg by mouth daily.   Yes [provider]  pantoprazole (PROTONIX) 40 MG tablet Take 1 tablet (40 mg total) by mouth daily. 11/13/19 02/11/20  Darlin Priestly, MD  promethazine (PHENERGAN) 25 MG suppository Place 1 suppository (25 mg total) rectally every 6 (six) hours as needed for nausea. 11/15/19 11/14/20  Kem Boroughs B, FNP    Allergies as of 02/20/2020   (No Known Allergies)    Family History  Problem Relation Age of Onset   Lupus Mother    Cancer Paternal Aunt        Breast    Cancer Paternal Aunt        Breast     Social History   Socioeconomic History   Marital status: Single    Spouse name: Not on file   Number of children: Not on file   Years of education: Not on file   Highest education level: Not on file  Occupational History   Not on file  Tobacco Use   Smoking status: Current Every Day Smoker    Packs/day: 0.20    Years: 15.00    Pack years: 3.00    Types: Cigarettes   Smokeless tobacco: Never Used  Vaping Use   Vaping Use: Never used  Substance and Sexual Activity   Alcohol use: Yes    Alcohol/week: 61.0 standard drinks    Types: 21 Cans of beer, [name]     Drug use: No   Sexual activity: Yes    Partners: Male    Birth control/protection: Injection  Other Topics Concern   Not on file  Social History Narrative   Not on file   Social Determinants of Health   Financial Resource Strain:    Difficulty of Paying Living Expenses: Not on file  Food Insecurity:    Worried About Programme researcher, broadcasting/film/video in the Last Year: Not on file   [employer] of Food in the Last Year: Not on file  Transportation Needs:    Lack of Transportation (Medical): Not on file   Lack of Transportation (Non-Medical): Not on file  Physical Activity:    Days of Exercise per Week: Not on file   Minutes of Exercise per Session: Not on file  Stress:    Feeling of Stress : Not on file  Social Connections:    Frequency of Communication with Friends and Family: Not on file   Frequency of Social Gatherings with Friends and Family: Not on file   Attends Religious Services: Not on file   Active Member of Clubs or Organizations: Not on file   Attends Banker Meetings:  Not on file   Marital Status: Not on file  Intimate Partner Violence:    Fear of Current or Ex-Partner: Not on file   Emotionally Abused: Not on file   Physically Abused: Not on file   Sexually Abused: Not on file    Review of Systems: See HPI, otherwise negative ROS  Physical Exam: BP 130/90    Pulse 79    Temp 97.9 F (36.6 C) (Temporal)    Resp 18    Ht 5\' 2"  (1.575 m)    Wt 47.6 kg    SpO2 100%    BMI 19.20 kg/m  General:   Alert,  pleasant and cooperative in NAD Head:  Normocephalic and atraumatic. Neck:  Supple; no masses or thyromegaly. Lungs:  Clear throughout to auscultation, normal respiratory effort.    Heart:  +S1, +S2, Regular rate and rhythm, No edema. Abdomen:  Soft, nontender and nondistended. Normal bowel sounds, without guarding, and without rebound.   Neurologic:  Alert and  oriented x4;  grossly normal neurologically.  Impression/Plan: Yvette [name] is here for an EGD for variceal screening  Risks, benefits, limitations, and alternatives regarding the procedure have been reviewed with the patient.  Questions have been answered.  All parties agreeable.   Marisa Severin, MD  03/14/2020, 8:52 AM

## 2020-03-14 NOTE — Transfer of Care (Signed)
Immediate Anesthesia Transfer of Care Note  Patient: Yvette [name]  Procedure(s) Performed: ESOPHAGOGASTRODUODENOSCOPY (EGD) WITH PROPOFOL (N/A )  Patient Location: PACU and Endoscopy Unit  Anesthesia Type:General  Level of Consciousness: awake  Airway & Oxygen Therapy: Patient Spontanous Breathing  Post-op Assessment: Report given to RN  Post vital signs: stable  Last Vitals:  Vitals Value Taken Time  BP 115/75 03/14/20 0921  Temp 36.1 C 03/14/20 0919  Pulse 94 03/14/20 0921  Resp 13 03/14/20 0921  SpO2 100 % 03/14/20 0921  Vitals shown include unvalidated device data.  Last Pain:  Vitals:   03/14/20 0919  TempSrc: Temporal  PainSc: 0-No pain         Complications: No complications documented.

## 2020-03-14 NOTE — OR Nursing (Signed)
Waiting on ride to come pt up.

## 2020-03-14 NOTE — Anesthesia Postprocedure Evaluation (Signed)
Anesthesia Post Note  Patient: Yvette [name]  Procedure(s) Performed: ESOPHAGOGASTRODUODENOSCOPY (EGD) WITH PROPOFOL (N/A )  Patient location during evaluation: PACU Anesthesia Type: General Level of consciousness: awake and alert Pain management: pain level controlled Vital Signs Assessment: post-procedure vital signs reviewed and stable Respiratory status: spontaneous breathing and respiratory function stable Cardiovascular status: stable Anesthetic complications: no   No complications documented.   Last Vitals:  Vitals:   03/14/20 0919 03/14/20 0929  BP: 115/75 130/90  Pulse: 99 87  Resp: 18 17  Temp: (!) 36.1 C   SpO2:  100%    Last Pain:  Vitals:   03/14/20 0929  TempSrc:   PainSc: 0-No pain                 Tres Grzywacz K

## 2020-03-14 NOTE — Op Note (Signed)
[facility] Patient Name: Yvette [name] Procedure Date: 03/14/2020 8:54 AM MRN: [id number] Account #: [id number] Date of Birth: 22-Jul-1980 Admit Type: Outpatient Age: 39 Room: [facility]OM 2 Gender: Female Note Status: Finalized Procedure:             Upper GI endoscopy Indications:           Cirrhosis rule out esophageal varices, Nausea with                         vomiting Providers:             Evvie Behrmann B. Maximino Greenland MD, MD Referring MD:          [facility] (Referring MD) Medicines:             Monitored Anesthesia Care Complications:         No immediate complications. Procedure:             Pre-Anesthesia Assessment:                        - Prior to the procedure, a History and Physical was                         performed, and patient medications, allergies and                         sensitivities were reviewed. The patient's tolerance                         of previous anesthesia was reviewed.                        - The risks and benefits of the procedure and the                         sedation options and risks were discussed with the                         patient. All questions were answered and informed                         consent was obtained.                        - Patient identification and proposed procedure were                         verified prior to the procedure by the physician, the                         nurse, the anesthesiologist, the anesthetist and the                         technician. The procedure was verified in the                         procedure room.                        - ASA Grade Assessment: II -  A patient with mild                         systemic disease.                        After obtaining informed consent, the endoscope was                         passed under direct vision. Throughout the procedure,                         the patient's blood pressure, pulse,  and oxygen                         saturations were monitored continuously. The Endoscope                         was introduced through the mouth, and advanced to the                         second part of duodenum. The upper GI endoscopy was                         accomplished with ease. The patient tolerated the                         procedure well. Findings:      White nummular lesions were noted in the distal esophagus. Brushings for       KOH prep were obtained.      Mucosal changes including circumferential folds were found in the entire       esophagus. Biopsies were obtained from the proximal and distal esophagus       with cold forceps for histology of suspected eosinophilic esophagitis.      There is no endoscopic evidence of varices in the entire esophagus.      A small hiatal hernia was present.      Mild portal hypertensive gastropathy was found in the gastric body.      The exam of the stomach was otherwise normal.      The duodenal bulb, second portion of the duodenum and examined duodenum       were normal. Impression:            - White nummular lesions in esophageal mucosa.                         Brushings performed.                        - Esophageal mucosal changes suggestive of                         eosinophilic esophagitis. Biopsied.                        - Small hiatal hernia.                        - Portal hypertensive gastropathy.                        -  Normal duodenal bulb, second portion of the duodenum                         and examined duodenum. Recommendation:        - Await pathology results.                        - Discharge patient to home (with escort).                        - Advance diet as tolerated.                        - Continue present medications.                        - Patient has a contact number available for                         emergencies. The signs and symptoms of potential                         delayed  complications were discussed with the patient.                         Return to normal activities tomorrow. Written                         discharge instructions were provided to the patient.                        - Discharge patient to home (with escort).                        - The findings and recommendations were discussed with                         the patient.                        - The findings and recommendations were discussed with                         the patient's family.                        - Return to my office in 4 weeks. Procedure Code(s):     --- Professional ---                        [phone], Esophagogastroduodenoscopy, flexible,                         transoral; with biopsy, single or multiple Diagnosis Code(s):     --- Professional ---                        K22.8, Other specified diseases of esophagus                        K44.9, Diaphragmatic hernia without obstruction or  gangrene                        K76.6, Portal hypertension                        K31.89, Other diseases of stomach and duodenum                        K74.60, Unspecified cirrhosis of liver                        R11.2, Nausea with vomiting, unspecified CPT copyright 2019 American Medical Association. All rights reserved. The codes documented in this report are preliminary and upon coder review may  be revised to meet current compliance requirements.  [name], MD Michel Bickers B. Maximino Greenland MD, MD 03/14/2020 9:21:19 AM This report has been signed electronically. Number of Addenda: 0 Note Initiated On: 03/14/2020 8:54 AM Estimated Blood Loss:  Estimated blood loss: none.      [facility]

## 2020-03-14 NOTE — Anesthesia Preprocedure Evaluation (Signed)
Anesthesia Evaluation  Patient identified by MRN, date of birth, ID band Patient awake    Reviewed: Allergy & Precautions, NPO status , Patient's Chart, lab work & pertinent test results  History of Anesthesia Complications Negative for: history of anesthetic complications  Airway Mallampati: II       Dental   Pulmonary asthma (no inhalers for years) , neg sleep apnea, Current Smoker,           Cardiovascular (-) hypertension(-) Past MI and (-) CHF (-) dysrhythmias (-) Valvular Problems/Murmurs     Neuro/Psych neg Seizures    GI/Hepatic Neg liver ROS, GERD  Medicated and Controlled,  Endo/Other  neg diabetes  Renal/GU negative Renal ROS     Musculoskeletal   Abdominal   Peds  Hematology   Anesthesia Other Findings   Reproductive/Obstetrics                             Anesthesia Physical Anesthesia Plan  ASA: II  Anesthesia Plan: General   Post-op Pain Management:    Induction: Intravenous  PONV Risk Score and Plan: 2 and Propofol infusion and TIVA  Airway Management Planned: Nasal Cannula  Additional Equipment:   Intra-op Plan:   Post-operative Plan:   Informed Consent: I have reviewed the patients History and Physical, chart, labs and discussed the procedure including the risks, benefits and alternatives for the proposed anesthesia with the patient or authorized representative who has indicated his/her understanding and acceptance.       Plan Discussed with:   Anesthesia Plan Comments:         Anesthesia Quick Evaluation

## 2020-03-16 ENCOUNTER — Encounter: Payer: Self-pay | Admitting: Gastroenterology

## 2020-03-19 LAB — SURGICAL PATHOLOGY

## 2020-03-20 ENCOUNTER — Encounter: Payer: Self-pay | Admitting: Gastroenterology

## 2020-05-08 ENCOUNTER — Ambulatory Visit: Payer: Medicaid Other | Admitting: Gastroenterology

## 2020-06-23 ENCOUNTER — Ambulatory Visit: Payer: Medicaid Other | Admitting: Gastroenterology

## 2020-06-23 ENCOUNTER — Encounter: Payer: Self-pay | Admitting: *Deleted

## 2020-08-19 ENCOUNTER — Inpatient Hospital Stay: Payer: Medicaid Other | Admitting: Internal Medicine

## 2020-08-19 ENCOUNTER — Inpatient Hospital Stay: Payer: Medicaid Other | Attending: Internal Medicine

## 2020-10-26 ENCOUNTER — Other Ambulatory Visit: Payer: Self-pay

## 2020-10-26 ENCOUNTER — Inpatient Hospital Stay: Payer: Medicaid Other

## 2020-10-26 ENCOUNTER — Inpatient Hospital Stay
Admission: EM | Admit: 2020-10-26 | Discharge: 2020-10-31 | DRG: 896 | Disposition: A | Discharge status: Home / Self Care | Payer: Medicaid Other | Attending: Internal Medicine | Admitting: Internal Medicine

## 2020-10-26 DIAGNOSIS — Z9119 Patient's noncompliance with other medical treatment and regimen: Secondary | ICD-10-CM | POA: Diagnosis not present

## 2020-10-26 DIAGNOSIS — D61818 Other pancytopenia: Secondary | ICD-10-CM | POA: Diagnosis present

## 2020-10-26 DIAGNOSIS — R000 Tachycardia, unspecified: Secondary | ICD-10-CM | POA: Diagnosis present

## 2020-10-26 DIAGNOSIS — K3189 Other diseases of stomach and duodenum: Secondary | ICD-10-CM | POA: Diagnosis present

## 2020-10-26 DIAGNOSIS — R748 Abnormal levels of other serum enzymes: Secondary | ICD-10-CM | POA: Diagnosis present

## 2020-10-26 DIAGNOSIS — K222 Esophageal obstruction: Secondary | ICD-10-CM | POA: Diagnosis present

## 2020-10-26 DIAGNOSIS — E872 Acidosis: Secondary | ICD-10-CM | POA: Diagnosis present

## 2020-10-26 DIAGNOSIS — K2100 Gastro-esophageal reflux disease with esophagitis, without bleeding: Secondary | ICD-10-CM | POA: Diagnosis present

## 2020-10-26 DIAGNOSIS — N179 Acute kidney failure, unspecified: Secondary | ICD-10-CM | POA: Diagnosis not present

## 2020-10-26 DIAGNOSIS — E8729 Other acidosis: Secondary | ICD-10-CM | POA: Diagnosis present

## 2020-10-26 DIAGNOSIS — K2210 Ulcer of esophagus without bleeding: Secondary | ICD-10-CM | POA: Diagnosis not present

## 2020-10-26 DIAGNOSIS — F17210 Nicotine dependence, cigarettes, uncomplicated: Secondary | ICD-10-CM | POA: Diagnosis present

## 2020-10-26 DIAGNOSIS — K709 Alcoholic liver disease, unspecified: Secondary | ICD-10-CM | POA: Diagnosis present

## 2020-10-26 DIAGNOSIS — F10939 Alcohol use, unspecified with withdrawal, unspecified: Secondary | ICD-10-CM | POA: Diagnosis present

## 2020-10-26 DIAGNOSIS — E876 Hypokalemia: Secondary | ICD-10-CM | POA: Diagnosis present

## 2020-10-26 DIAGNOSIS — K2211 Ulcer of esophagus with bleeding: Secondary | ICD-10-CM | POA: Diagnosis present

## 2020-10-26 DIAGNOSIS — K7030 Alcoholic cirrhosis of liver without ascites: Secondary | ICD-10-CM | POA: Diagnosis present

## 2020-10-26 DIAGNOSIS — Z20822 Contact with and (suspected) exposure to covid-19: Secondary | ICD-10-CM | POA: Diagnosis present

## 2020-10-26 DIAGNOSIS — K766 Portal hypertension: Secondary | ICD-10-CM | POA: Diagnosis present

## 2020-10-26 DIAGNOSIS — K920 Hematemesis: Secondary | ICD-10-CM

## 2020-10-26 DIAGNOSIS — F10239 Alcohol dependence with withdrawal, unspecified: Secondary | ICD-10-CM | POA: Diagnosis present

## 2020-10-26 DIAGNOSIS — F10229 Alcohol dependence with intoxication, unspecified: Secondary | ICD-10-CM | POA: Diagnosis present

## 2020-10-26 DIAGNOSIS — Z452 Encounter for adjustment and management of vascular access device: Secondary | ICD-10-CM

## 2020-10-26 DIAGNOSIS — K2920 Alcoholic gastritis without bleeding: Secondary | ICD-10-CM | POA: Diagnosis not present

## 2020-10-26 DIAGNOSIS — Y900 Blood alcohol level of less than 20 mg/100 ml: Secondary | ICD-10-CM | POA: Diagnosis present

## 2020-10-26 DIAGNOSIS — K449 Diaphragmatic hernia without obstruction or gangrene: Secondary | ICD-10-CM | POA: Diagnosis present

## 2020-10-26 DIAGNOSIS — Z79899 Other long term (current) drug therapy: Secondary | ICD-10-CM

## 2020-10-26 DIAGNOSIS — R0602 Shortness of breath: Secondary | ICD-10-CM

## 2020-10-26 LAB — COMPREHENSIVE METABOLIC PANEL
ALT: 131 U/L — ABNORMAL HIGH (ref 0–44)
AST: 283 U/L — ABNORMAL HIGH (ref 15–41)
Albumin: 5.3 g/dL — ABNORMAL HIGH (ref 3.5–5.0)
Alkaline Phosphatase: 118 U/L (ref 38–126)
Anion gap: 27 — ABNORMAL HIGH (ref 5–15)
BUN: 17 mg/dL (ref 6–20)
CO2: 12 mmol/L — ABNORMAL LOW (ref 22–32)
Calcium: 9.6 mg/dL (ref 8.9–10.3)
Chloride: 98 mmol/L (ref 98–111)
Creatinine, Ser: 1.39 mg/dL — ABNORMAL HIGH (ref 0.44–1.00)
GFR, Estimated: 50 mL/min — ABNORMAL LOW (ref >60)
Glucose, Bld: 169 mg/dL — ABNORMAL HIGH (ref 70–99)
Potassium: 4.9 mmol/L (ref 3.5–5.1)
Sodium: 137 mmol/L (ref 135–145)
Total Bilirubin: 1.6 mg/dL — ABNORMAL HIGH (ref 0.3–1.2)
Total Protein: 10 g/dL — ABNORMAL HIGH (ref 6.5–8.1)

## 2020-10-26 LAB — GLUCOSE, CAPILLARY: Glucose-Capillary: 137 mg/dL — ABNORMAL HIGH (ref 70–99)

## 2020-10-26 LAB — POC URINE PREG, ED: Preg Test, Ur: NEGATIVE

## 2020-10-26 LAB — PHOSPHORUS: Phosphorus: 2.2 mg/dL — ABNORMAL LOW (ref 2.5–4.6)

## 2020-10-26 LAB — CBC
HCT: 46.1 % — ABNORMAL HIGH (ref 36.0–46.0)
Hemoglobin: 14.5 g/dL (ref 12.0–15.0)
MCH: 28.2 pg (ref 26.0–34.0)
MCHC: 31.5 g/dL (ref 30.0–36.0)
MCV: 89.5 fL (ref 80.0–100.0)
Platelets: 202 10*3/uL (ref 150–400)
RBC: 5.15 MIL/uL — ABNORMAL HIGH (ref 3.87–5.11)
RDW: 16.6 % — ABNORMAL HIGH (ref 11.5–15.5)
WBC: 16.5 10*3/uL — ABNORMAL HIGH (ref 4.0–10.5)
nRBC: 0 % (ref 0.0–0.2)

## 2020-10-26 LAB — HEPATITIS B SURFACE ANTIGEN: Hepatitis B Surface Ag: NONREACTIVE

## 2020-10-26 LAB — ACETAMINOPHEN LEVEL: Acetaminophen (Tylenol), Serum: <10 ug/mL — ABNORMAL LOW (ref 10–30)

## 2020-10-26 LAB — LACTIC ACID, PLASMA
Lactic Acid, Venous: 6.5 mmol/L (ref 0.5–1.9)
Lactic Acid, Venous: 3.1 mmol/L (ref 0.5–1.9)

## 2020-10-26 LAB — TYPE AND SCREEN
ABO/RH(D): O POS
Antibody Screen: NEGATIVE

## 2020-10-26 LAB — RESP PANEL BY RT-PCR (FLU A&B, COVID) ARPGX2
Influenza A by PCR: NEGATIVE
Influenza B by PCR: NEGATIVE
SARS Coronavirus 2 by RT PCR: NEGATIVE

## 2020-10-26 LAB — MAGNESIUM: Magnesium: 1.7 mg/dL (ref 1.7–2.4)

## 2020-10-26 LAB — PROTIME-INR
INR: 1.2 (ref 0.8–1.2)
Prothrombin Time: 14.7 seconds (ref 11.4–15.2)

## 2020-10-26 LAB — HEMOGLOBIN AND HEMATOCRIT, BLOOD
HCT: 42.4 % (ref 36.0–46.0)
HCT: 35 % — ABNORMAL LOW (ref 36.0–46.0)
Hemoglobin: 13.9 g/dL (ref 12.0–15.0)
Hemoglobin: 11.8 g/dL — ABNORMAL LOW (ref 12.0–15.0)

## 2020-10-26 LAB — SALICYLATE LEVEL: Salicylate Lvl: <7 mg/dL — ABNORMAL LOW (ref 7.0–30.0)

## 2020-10-26 LAB — MRSA PCR SCREENING: MRSA by PCR: NEGATIVE

## 2020-10-26 LAB — HEPATITIS C ANTIBODY: HCV Ab: NONREACTIVE

## 2020-10-26 LAB — OSMOLALITY: Osmolality: 313 mOsm/kg — ABNORMAL HIGH (ref 275–295)

## 2020-10-26 LAB — HCG, QUANTITATIVE, PREGNANCY: hCG, Beta Chain, Quant, S: <1 m[IU]/mL (ref <5)

## 2020-10-26 LAB — HEPATITIS B CORE ANTIBODY, IGM: Hep B C IgM: NONREACTIVE

## 2020-10-26 LAB — LIPASE, BLOOD: Lipase: 21 U/L (ref 11–51)

## 2020-10-26 LAB — HIV ANTIBODY (ROUTINE TESTING W REFLEX): HIV Screen 4th Generation wRfx: NONREACTIVE

## 2020-10-26 MED ORDER — PANTOPRAZOLE SODIUM 40 MG IV SOLR
40.0000 mg | Freq: Once | INTRAVENOUS | Status: AC
  Administered 2020-10-26: 40 mg via INTRAVENOUS
  Filled 2020-10-26: qty 40

## 2020-10-26 MED ORDER — LORAZEPAM 1 MG PO TABS: 1.0000 mg | ORAL_TABLET | ORAL | Status: DC | PRN

## 2020-10-26 MED ORDER — THIAMINE HCL 100 MG/ML IJ SOLN
Freq: Once | INTRAVENOUS | Status: AC
  Filled 2020-10-26: qty 1000

## 2020-10-26 MED ORDER — LACTATED RINGERS IV BOLUS
1000.0000 mL | Freq: Once | INTRAVENOUS | Status: AC
  Administered 2020-10-26: 1000 mL via INTRAVENOUS

## 2020-10-26 MED ORDER — LORAZEPAM 2 MG/ML IJ SOLN
0.0000 mg | Freq: Four times a day (QID) | INTRAMUSCULAR | Status: AC
  Administered 2020-10-27: 1 mg via INTRAVENOUS
  Filled 2020-10-26: qty 1

## 2020-10-26 MED ORDER — LORAZEPAM 2 MG/ML IJ SOLN: 1.0000 mg | Freq: Once | INTRAMUSCULAR | Status: DC

## 2020-10-26 MED ORDER — ONDANSETRON HCL 4 MG/2ML IJ SOLN
4.0000 mg | Freq: Four times a day (QID) | INTRAMUSCULAR | Status: DC | PRN
  Administered 2020-10-31: 4 mg via INTRAVENOUS
  Filled 2020-10-26 (×4): qty 2

## 2020-10-26 MED ORDER — HYDROMORPHONE HCL 1 MG/ML IJ SOLN
0.5000 mg | Freq: Once | INTRAMUSCULAR | Status: AC
  Administered 2020-10-26: 0.5 mg via INTRAVENOUS
  Filled 2020-10-26: qty 1

## 2020-10-26 MED ORDER — CHLORHEXIDINE GLUCONATE CLOTH 2 % EX PADS
6.0000 | MEDICATED_PAD | Freq: Every day | CUTANEOUS | Status: DC
  Administered 2020-10-27: 6 via TOPICAL
  Filled 2020-10-26: qty 6

## 2020-10-26 MED ORDER — CHLORHEXIDINE GLUCONATE CLOTH 2 % EX PADS: 6.0000 | MEDICATED_PAD | Freq: Every day | CUTANEOUS | Status: DC

## 2020-10-26 MED ORDER — LORAZEPAM 2 MG/ML IJ SOLN
0.0000 mg | Freq: Two times a day (BID) | INTRAMUSCULAR | Status: AC
  Filled 2020-10-26: qty 1

## 2020-10-26 MED ORDER — LORAZEPAM 2 MG/ML IJ SOLN
2.0000 mg | Freq: Once | INTRAMUSCULAR | Status: AC
  Administered 2020-10-26: 2 mg via INTRAVENOUS
  Filled 2020-10-26: qty 1

## 2020-10-26 MED ORDER — PROCHLORPERAZINE EDISYLATE 10 MG/2ML IJ SOLN
10.0000 mg | Freq: Once | INTRAMUSCULAR | Status: AC
  Administered 2020-10-26: 10 mg via INTRAVENOUS
  Filled 2020-10-26: qty 2

## 2020-10-26 MED ORDER — PANTOPRAZOLE SODIUM 40 MG IV SOLR
40.0000 mg | INTRAVENOUS | Status: DC
  Administered 2020-10-27: 40 mg via INTRAVENOUS
  Filled 2020-10-26: qty 40

## 2020-10-26 MED ORDER — HEPARIN SODIUM (PORCINE) 1000 UNIT/ML IJ SOLN
2800.0000 [IU] | Freq: Once | INTRAMUSCULAR | Status: AC
  Administered 2020-10-26: 2800 [IU]

## 2020-10-26 MED ORDER — ONDANSETRON HCL 4 MG PO TABS
4.0000 mg | ORAL_TABLET | Freq: Four times a day (QID) | ORAL | Status: DC | PRN
  Administered 2020-10-27: 4 mg via ORAL
  Filled 2020-10-26: qty 1

## 2020-10-26 MED ORDER — LORAZEPAM 2 MG/ML IJ SOLN: 1.0000 mg | INTRAMUSCULAR | Status: DC | PRN

## 2020-10-26 NOTE — ED Notes (Addendum)
NP and MD at bedside for trialysis cath placement

## 2020-10-26 NOTE — Procedures (Signed)
Central Venous Catheter Insertion Procedure Note  Yvette [name]  [id number]  04-10-1981  Date:10/26/20  Time:3:40 PM   Provider Performing:Lyzette Reinhardt A Simone Tuckey   Procedure: Insertion of Non-tunneled Central Venous Catheter(36556)with US guidance (16606)    Indication(s) Hemodialysis  Consent Risks of the procedure as well as the alternatives and risks of each were explained to the patient and/or caregiver.  Consent for the procedure was obtained and is signed in the bedside chart  Anesthesia Topical only with 1% lidocaine   Timeout Verified patient identification, verified procedure, site/side was marked, verified correct patient position, special equipment/implants available, medications/allergies/relevant history reviewed, required imaging and test results available.  Sterile Technique Maximal sterile technique including full sterile barrier drape, hand hygiene, sterile gown, sterile gloves, mask, hair covering, sterile ultrasound probe cover (if used).  Procedure Description Area of catheter insertion was cleaned with chlorhexidine and draped in sterile fashion.   With real-time ultrasound guidance a HD catheter was placed into the right internal jugular vein.  Nonpulsatile blood flow and easy flushing noted in all ports.  The catheter was sutured in place and sterile dressing applied.  Complications/Tolerance None; patient tolerated the procedure well. Chest X-ray is ordered to verify placement for internal jugular or subclavian cannulation.  Chest x-ray is not ordered for femoral cannulation.  EBL Minimal  Specimen(s) None  Catheter placed at 17cm mark BIOPATCH APPLIED

## 2020-10-26 NOTE — Consult Note (Addendum)
NAME:  Yvette [name], MRN:  [id number], DOB:  05-11-1981, LOS: 0 ADMISSION DATE:  10/26/2020, INITIAL CONSULTATION DATE:  10/26/2020 REFERRING MD:  Yvette Dowdy, MD, CHIEF COMPLAINT: Alcohol overdose  Brief Patient Description  Patient is a 40 year old female who presents to the ER for evaluation of nausea and coffee-ground emesis for about 2 days prior to her admission. Patient has history of alcohol abuse and found to have serum osm gap and anion gap. Concerning for ethyl alcohol ingestion. Given this concerns, Nephrology was consulted for possible emergent HD.  PCCM consulted for CVL placement.  Pertinent  Medical History   Asthma   H/O Kawasaki's disease   as 71 month old child and at 11 years  History of anemia   History of blood transfusion 1984  Leukopenia   Neutropenia (HCC)     OBJECTIVE   Blood pressure (!) 128/91, pulse 89, temperature 100 F (37.8 C), temperature source Axillary, resp. rate 18, weight 49.1 kg, SpO2 100 %.       No intake or output data in the 24 hours ending 10/26/20 1833 Filed Weights   10/26/20 1102 10/26/20 1704  Weight: 45 kg 49.1 kg    Physical Examination: GENERAL: 40 year-old patient lying in the bed obtunded EYES: Pupils equal, round, reactive to light and accommodation. No scleral icterus. Extraocular muscles intact.  HEENT: Head atraumatic, normocephalic. Oropharynx and nasopharynx clear.  NECK:  Supple, no jugular venous distention. No thyroid enlargement, no tenderness.  LUNGS: Normal breath sounds bilaterally, no wheezing, rales,rhonchi or crepitation. No use of accessory muscles of respiration.  CARDIOVASCULAR: S1, S2 normal. No murmurs, rubs, or gallops.  ABDOMEN: Soft, nontender, nondistended. Bowel sounds present. No organomegaly or mass.  EXTREMITIES: No pedal edema, cyanosis, or clubbing.  NEUROLOGIC: unable to assess. Sensation intact. Gait not checked.  PSYCHIATRIC:unable to assess SKIN: No obvious rash, lesion, or  ulcer.   Labs/imaging that I havepersonally reviewed  (right click and "Reselect all SmartList Selections" daily)    Labs   CBC: Recent Labs  Lab 10/26/20 0938 10/26/20 1228 10/26/20 1752  WBC 16.5*  --   --   HGB 14.5 13.9 11.8*  HCT 46.1* 42.4 35.0*  MCV 89.5  --   --   PLT 202  --   --     Basic Metabolic Panel: Recent Labs  Lab 10/26/20 0938 10/26/20 1234  NA 137  --   K 4.9  --   CL 98  --   CO2 12*  --   GLUCOSE 169*  --   BUN 17  --   CREATININE 1.39*  --   CALCIUM 9.6  --   MG  --  1.7  PHOS  --  2.2*   GFR: CrCl cannot be calculated (Unknown ideal weight.). Recent Labs  Lab 10/26/20 0938 10/26/20 1031 10/26/20 1126  WBC 16.5*  --   --   LATICACIDVEN  --  6.5* 3.1*    Liver Function Tests: Recent Labs  Lab 10/26/20 0938  AST 283*  ALT 131*  ALKPHOS 118  BILITOT 1.6*  PROT 10.0*  ALBUMIN 5.3*   Recent Labs  Lab 10/26/20 0938  LIPASE 21   No results for input(s): AMMONIA in the last 168 hours.  ABG No results found for: PHART, PCO2ART, PO2ART, HCO3, TCO2, ACIDBASEDEF, O2SAT   Coagulation Profile: Recent Labs  Lab 10/26/20 1031  INR 1.2    Cardiac Enzymes: No results for input(s): CKTOTAL, CKMB, CKMBINDEX, TROPONINI in the last 168  hours.  HbA1C: No results found for: HGBA1C  CBG: Recent Labs  Lab 10/26/20 1708  GLUCAP 137*    Allergies No Known Allergies   Home Medications  Prior to Admission medications   Medication Sig Start Date End Date Taking? Authorizing Provider  mirtazapine (REMERON) 30 MG tablet Take 30 mg by mouth at bedtime.   Yes [provider]  FLUoxetine (PROZAC) 40 MG capsule Take 40 mg by mouth daily.    [provider]  ibuprofen (ADVIL) 800 MG tablet Take 800 mg by mouth every 6 (six) hours as needed. 05/28/20   [provider]  pantoprazole (PROTONIX) 40 MG tablet Take 1 tablet (40 mg total) by mouth daily. 11/13/19 02/11/20  Darlin Priestly, MD  promethazine (PHENERGAN) 25 MG  suppository Place 1 suppository (25 mg total) rectally every 6 (six) hours as needed for nausea. 11/15/19 11/14/20  Chinita Pester, FNP       ASSESSMENT & PLAN   Patient is obtunded and unable to provide history or consent at this time. The Clinical status was relayed to family (father) in detail by Nephrologist Dr. Wynelle [name]  I again discussed with this patient's father  the risks of central venous catheter placement include but are not limited to: bleeding, infection, injury of central venous structures, malpositioning of catheter, pneumothorax, complications of long-term placement of catheters, and death related to complications.  Patient's father understands the situation and need for CVL as relayed to him by the Nephrologist. He has consented and agreed to proceed with placement of CVL.   Family are satisfied with Plan of action and management. All questions answered  Critical care time: 32       Yvette Silversmith, DNP, FNP-C, AGACNP-BC Acute Care Nurse Practitioner  [facility] Pager: [phone] [place] [facility]

## 2020-10-26 NOTE — ED Notes (Signed)
Provider at bedside

## 2020-10-26 NOTE — Progress Notes (Signed)
This patient was to be dialyzed for 2 hours per MD orders. 11 mins rtd the venous chamber was observed with a solid clot that prevented any flushing. This clotting occurred despite flushing that was done during the hD tx. I was unable to return the blood from th circuit due to solid clotting in both arterial and venous sides of the ECC. Dr. Wynelle Link was called and made aware. No new orders were provided.  Hd was terminated without blood return and cvc was assessed for clots . No clots were in the cvc lines at the time of flushing, cvc was then heparin locked as per orders

## 2020-10-26 NOTE — ED Notes (Signed)
LR bolus started per verbal order from MD [place] at this time

## 2020-10-26 NOTE — Consult Note (Signed)
Yvette Bouillon, MD [address], [address], [place], [place], [address] [address], [place], [place], [address] Phone: [phone]  Fax: [phone]  Consultation  Referring Provider:     Dr. Larinda Buttery Primary Care Physician:  [facility], [facility] Reason for Consultation:     Coffee-ground emesis  Date of Admission:  10/26/2020 Date of Consultation:  10/26/2020         HPI:   Yvette [name] is a 40 y.o. female with history of daily alcohol use for 3 to 4 years, presents with coffee-ground emesis at home.  No red blood in emesis.  No melena.  Denies abdominal pain.  Denies any NSAID use.  Patient was previously seen in GI clinic for nausea vomiting in 2021 and underwent EGD that showed a hiatal hernia, portal hypertensive gastropathy, no varices with biopsies at this time showing chronic gastritis, negative for H. pylori, acute esophagitis, negative for increased eosinophils.  Patient has been noncompliant with follow-up in clinic.  FibroSure level was inconclusive.  Ultrasounds have shown increased hepatic parenchymal echogenicity, which is nonspecific.  Her liver enzymes have been chronically elevated, and she also has thrombocytopenia suggesting possible underlying cirrhosis  Past Medical History:  Diagnosis Date  . Asthma   . 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 History of anemia   . History of blood transfusion 1984  . Leukopenia   . Neutropenia [facility])     Past Surgical History:  Procedure Laterality Date  . ESOPHAGOGASTRODUODENOSCOPY (EGD) WITH PROPOFOL N/A 03/14/2020   Procedure: ESOPHAGOGASTRODUODENOSCOPY (EGD) WITH PROPOFOL;  Surgeon: Pasty Spillers, MD;  [name];  Service: Endoscopy;  [name];    Prior to Admission medications   Medication Sig Start Date End Date Taking? Authorizing Provider  FLUoxetine (PROZAC) 40 MG capsule Take 40 mg by mouth daily.    [provider]  pantoprazole (PROTONIX) 40 MG tablet Take 1 tablet (40 mg total) by mouth daily. 11/13/19 02/11/20  Darlin Priestly, MD  promethazine (PHENERGAN) 25 MG suppository Place 1 suppository (25 mg total) rectally every 6 (six) hours as needed for nausea. 11/15/19 11/14/20  Chinita Pester, FNP    Family History  Problem Relation Age of Onset  . Lupus Mother   . Cancer Paternal Aunt        Breast   . Cancer Paternal Aunt        Breast      Social History   Tobacco Use  . Smoking status: Current Every Day Smoker    Packs/day: 0.20    Years: 15.00    Pack years: 3.00    Types: Cigarettes  . Smokeless tobacco: Never Used  Vaping Use  . Vaping Use: Never used  Substance Use Topics  . Alcohol use: Yes    Alcohol/week: 61.0 standard drinks    Types: 21 Cans of beer, [name]  . Drug use: No    Allergies as of 10/26/2020  . (No Known Allergies)    Review of Systems:    All systems reviewed and negative except where noted in HPI.   Physical Exam:  Vital signs in last 24 hours: Vitals:   10/26/20 0937 10/26/20 1030  BP: (!) 132/102 (!) 137/106  Pulse: (!) 140 (!) 128  Resp: 18 (!) 31  Temp: 97.9 F (36.6 C)   TempSrc: Oral  SpO2: 98% 98%     General:   Pleasant, cooperative in NAD Head:  Normocephalic and atraumatic. Eyes:   No icterus.   Conjunctiva pink. PERRLA. Ears:  Normal auditory acuity. Neck:  Supple; no masses or thyroidomegaly Lungs: Respirations even and unlabored. Lungs clear to auscultation bilaterally.   No wheezes, crackles, or rhonchi.  Abdomen:  Soft, nondistended, nontender. Normal bowel sounds. No appreciable masses or hepatomegaly.  No rebound or guarding.  Neurologic:  Alert and oriented x3;  grossly normal neurologically. Skin:  Intact without significant lesions or rashes. Cervical Nodes:  No significant cervical adenopathy. Psych:  Alert and cooperative. Normal affect.  LAB RESULTS: Recent Labs     10/26/20 0938  WBC 16.5*  HGB 14.5  HCT 46.1*  PLT 202   BMET Recent Labs    10/26/20 0938  NA 137  K 4.9  CL 98  CO2 12*  GLUCOSE 169*  BUN 17  CREATININE 1.39*  CALCIUM 9.6   LFT Recent Labs    10/26/20 0938  PROT 10.0*  ALBUMIN 5.3*  AST 283*  ALT 131*  ALKPHOS 118  BILITOT 1.6*   PT/INR No results for input(s): LABPROT, INR in the last 72 hours.  STUDIES: No results found.    Impression / Plan:   Yvette [name] is a 40 y.o. y/o female with history of daily alcohol abuse, presents with coffee-ground emesis, with no varices noted on upper endoscopy in September 2021  Suspicion for variceal bleeding is low at this time, given that no varices were seen on procedure done less than a year ago  Clinically she is hemodynamically stable, with a normal hemoglobin, albeit likely hemoconcentrated at this time, with repeat levels to be more reflective of true hemoglobin status  PPI IV twice daily  Continue serial CBCs and transfuse PRN Avoid NSAIDs Maintain 2 large-bore IV lines Please page GI with any acute hemodynamic changes, or signs of active GI bleeding  If coffee-ground emesis continues to occur patient or pt has drastic drop in hemoglobin, can consider octreotide drip, no indication at this time  INR is pending COVID testing is pending  Depending on improvement in clinical status, can consider need for upper endoscopy if symptoms continue  Encourage abstinence CIWA protocol Folate, thiamine Avoid hepatotoxic drugs  Liver enzyme elevation most consistent with ongoing alcohol abuse given > 2:1 ratio of AST ALT  Ultrasound did not show any biliary obstruction  Patient had several lab tests ordered when she visited [place] in clinic in April 2021, but patient never had these drawn.  Will obtain at this time  Once clinical status and nausea vomiting improve, okay to start clear liquid diet later today  N.p.o. past midnight to reassess symptoms tomorrow  and evaluate if upper endoscopy is appropriate in the inpatient setting  Dr. Tobi Bastos to follow the patient from tomorrow  Thank you for involving me in the care of this patient.      LOS: 0 days   Pasty Spillers, MD  10/26/2020, 10:57 AM

## 2020-10-26 NOTE — ED Notes (Signed)
Informed RN bed assigned 

## 2020-10-26 NOTE — H&P (Addendum)
History and Physical    Yvette [name] NAT:[id number] DOB: 1981/04/02 DOA: 10/26/2020  PCP: [facility]   Patient coming from: Home  I have personally briefly reviewed patient's old medical records in [facility]  Chief Complaint: Coffee ground emesis Most of the history is obtained from the ER notes.  Patient is lethargic and unable to provide any history. HPI: Yvette [name] is a 40 y.o. female with medical history significant for alcohol dependence who presents to the emergency room via EMS for evaluation of nausea, vomiting and coffee-ground emesis for 2 days prior to her admission.  Patient's last alcohol use was 2 days ago and during my evaluation she has generalized tremors.  She had received a dose of Ativan 2 mg in the ER because of her tremors and tachycardia concerning for alcohol withdrawal. She denies any toxic alcoholic ingestion. I am unable to do a review of systems on this patient due to lethargy Labs show sodium 137, potassium 4.9, chloride 98, bicarb 12, glucose 169, BUN 17, creatinine 1.3 above a baseline of 0.49, calcium 9.6, anion gap 27, alkaline phosphatase 118, albumin 5.3, lipase 21, AST 283, ALT 131, total protein 10.0, lactic acid 6.5, osmolality 313, white count 16.5, hemoglobin 14.5, hematocrit 46.1, MCV 89.5, RDW 16.6, platelet count 202, PT 14.7, INR 1.2 Urine pregnancy test is negative Respiratory viral panel is negative Twelve-lead EKG reviewed by me shows sinus tachycardia.    ED Course: Patient is a 40 year old female who presents to the ER for evaluation of nausea and coffee-ground emesis for about 2 days prior to her admission.  She received lorazepam 2 mg IV in the ER for alcohol withdrawal symptoms and is currently lethargic but arouses easily. She has an anion gap metabolic acidosis, with elevated lactic acid at 6.5 as well as an osmolar gap. She also has acute kidney injury and there is a concern for possible toxic  ingestion even though patient denies. She will be admitted to the hospital for further evaluation.   Review of Systems: As per HPI otherwise all other systems reviewed and negative.    Past Medical History:  Diagnosis Date  . Asthma   . H/O Kawasaki's disease    as 75 month old child and at 86 years  . History of anemia   . History of blood transfusion 1984  . Leukopenia   . Neutropenia [facility])     Past Surgical History:  Procedure Laterality Date  . ESOPHAGOGASTRODUODENOSCOPY (EGD) WITH PROPOFOL N/A 03/14/2020   Procedure: ESOPHAGOGASTRODUODENOSCOPY (EGD) WITH PROPOFOL;  Surgeon: Pasty Spillers, MD;  [name];  Service: Endoscopy;  [name];     reports that she has been smoking cigarettes. She has a 3.00 pack-year smoking history. She has never used smokeless tobacco. She reports current alcohol use of about 61.0 standard drinks of alcohol per week. She reports that she does not use drugs.  No Known Allergies  Family History  Problem Relation Age of Onset  . Lupus Mother   . Cancer Paternal Aunt        Breast   . Cancer Paternal Aunt        Breast       Prior to Admission medications   Medication Sig Start Date End Date Taking? Authorizing Provider  FLUoxetine (PROZAC) 40 MG capsule Take 40 mg by mouth daily.    [provider]  ibuprofen (ADVIL) 800 MG tablet Take 800 mg by mouth every 6 (six) hours  as needed. 05/28/20   [provider]  pantoprazole (PROTONIX) 40 MG tablet Take 1 tablet (40 mg total) by mouth daily. 11/13/19 02/11/20  Darlin Priestly, MD  promethazine (PHENERGAN) 25 MG suppository Place 1 suppository (25 mg total) rectally every 6 (six) hours as needed for nausea. 11/15/19 11/14/20  Chinita Pester, FNP    Physical Exam: Vitals:   10/26/20 0937 10/26/20 1030 10/26/20 1102  BP: (!) 132/102 (!) 137/106   Pulse: (!) 140 (!) 128   Resp: 18 (!) 31   Temp: 97.9 F (36.6 C)    TempSrc: Oral    SpO2: 98% 98%   Weight:    45 kg     Vitals:   10/26/20 0937 10/26/20 1030 10/26/20 1102  BP: (!) 132/102 (!) 137/106   Pulse: (!) 140 (!) 128   Resp: 18 (!) 31   Temp: 97.9 F (36.6 C)    TempSrc: Oral    SpO2: 98% 98%   Weight:   45 kg      Constitutional:  Lethargic but arouses to verbal stimuli. Not in any apparent distress HEENT:      Head: Normocephalic and atraumatic.         Eyes: PERLA, EOMI, Conjunctivae pallor. Sclera is non-icteric.       Mouth/Throat: Mucous membranes are moist.       Neck: Supple with no signs of meningismus. Cardiovascular:  Tachycardia. No murmurs, gallops, or rubs. 2+ symmetrical distal pulses are present . No JVD. No LE edema Respiratory: Respiratory effort normal .Lungs sounds clear  bilaterally. No wheezes, crackles, or rhonchi.  Gastrointestinal: Soft, non tender, and non distended with positive bowel sounds.  Genitourinary: No CVA tenderness. Musculoskeletal: Nontender with normal range of motion in all extremities. No cyanosis, or erythema of extremities. Neurologic:  Face is symmetric.  Generalized tremors, unable to assess as patient is lethargic Skin: Skin is warm, dry.  No rash or ulcers Psychiatric: Unable to assess   Labs on Admission: I have personally reviewed following labs and imaging studies  CBC: Recent Labs  Lab 10/26/20 0938  WBC 16.5*  HGB 14.5  HCT 46.1*  MCV 89.5  PLT 202   Basic Metabolic Panel: Recent Labs  Lab 10/26/20 0938  NA 137  K 4.9  CL 98  CO2 12*  GLUCOSE 169*  BUN 17  CREATININE 1.39*  CALCIUM 9.6   GFR: CrCl cannot be calculated (Unknown ideal weight.). Liver Function Tests: Recent Labs  Lab 10/26/20 0938  AST 283*  ALT 131*  ALKPHOS 118  BILITOT 1.6*  PROT 10.0*  ALBUMIN 5.3*   Recent Labs  Lab 10/26/20 0938  LIPASE 21   No results for input(s): AMMONIA in the last 168 hours. Coagulation Profile: Recent Labs  Lab 10/26/20 1031  INR 1.2   Cardiac Enzymes: No results for input(s): CKTOTAL,  CKMB, CKMBINDEX, TROPONINI in the last 168 hours. BNP (last 3 results) No results for input(s): PROBNP in the last 8760 hours. HbA1C: No results for input(s): HGBA1C in the last 72 hours. CBG: No results for input(s): GLUCAP in the last 168 hours. Lipid Profile: No results for input(s): CHOL, HDL, LDLCALC, TRIG, CHOLHDL, LDLDIRECT in the last 72 hours. Thyroid Function Tests: No results for input(s): TSH, T4TOTAL, FREET4, T3FREE, THYROIDAB in the last 72 hours. Anemia Panel: No results for input(s): VITAMINB12, FOLATE, FERRITIN, TIBC, IRON, RETICCTPCT in the last 72 hours. Urine analysis:    Component Value Date/Time   COLORURINE YELLOW (A) 11/09/2019 0240  APPEARANCEUR CLEAR (A) 11/09/2019 0240   LABSPEC 1.020 11/09/2019 0240   PHURINE 6.0 11/09/2019 0240   GLUCOSEU NEGATIVE 11/09/2019 0240   HGBUR SMALL (A) 11/09/2019 0240   BILIRUBINUR NEGATIVE 11/09/2019 0240   KETONESUR 80 (A) 11/09/2019 0240   PROTEINUR 100 (A) 11/09/2019 0240   NITRITE NEGATIVE 11/09/2019 0240   LEUKOCYTESUR NEGATIVE 11/09/2019 0240    Radiological Exams on Admission: No results found.   Assessment/Plan Principal Problem:   Alcohol withdrawal (HCC) Active Problems:   Alcoholic gastritis   Portal hypertension (HCC)   Alcoholic liver disease, unspecified (HCC)   Alcoholic ketoacidosis    Alcohol withdrawal Patient with history of alcohol abuse/dependence who presents to the emergency room for evaluation of nausea and emesis. Patient is tachycardic and has generalized tremors associated with symptoms of alcohol withdrawal We will place patient on CIWA protocol and administer lorazepam for CIWA score of 8 or greater    Alcoholic gastritis/ liver disease with portal hypertension Patient presents for evaluation of nausea and coffee-ground emesis Patient had an upper endoscopy which showed hiatal hernia, portal hypertensive gastropathy but no evidence of varices Patient has chronically elevated  liver enzymes Place patient on IV PPI Monitor serial H&H Appreciate GI consult   Acute kidney injury At baseline patient has a serum creatinine of 0.49 and today on admission it is 1.39 She also has an osmolar gap with anion gap metabolic acidosis Concern for possible toxic alcohol ingestion even though patient denies We will obtain methanol and ethylene glycol levels Obtain salicylic acid levels and acetaminophen levels Continue IV fluid hydration Consult nephrology   Alcoholic ketoacidosis Patient presents for evaluation of nausea and vomiting for 2 days with poor oral intake She has an anion gap metabolic acidosis Aggressive IV fluid resuscitation with banana bag   Leukocytosis  Appears to be secondary to acute blood loss. Will obtain UA and chest x-ray to rule out infective   DVT prophylaxis: SCD Code Status: full code Family Communication: Called and discussed patient's condition and plan of care with her father over the phone. Disposition Plan: Back to previous home environment Consults called: Gastroenterology Status: At the time of admission, it appears that the appropriate admission status for this patient is inpatient. This is judged to be reasonable and necessary in order to provide the required intensity of service to ensure the patient's safety given the presenting symptoms, physical exam findings and initial radiographic and laboratory data in the context of their comorbid conditions. Patient requires inpatient status due to high intensity of service, high risk for further deterioration and high frequency of surveillance required.    Lucile Shutters MD [facility]     10/26/2020, 12:22 PM

## 2020-10-26 NOTE — ED Provider Notes (Signed)
[facility] Emergency Department Provider Note   ____________________________________________   Event Date/Time   First MD Initiated Contact with Patient 10/26/20 1010     (approximate)  I have reviewed the triage vital signs and the nursing notes.   HISTORY  Chief Complaint GI Bleeding    HPI Yvette [name] is a 40 y.o. female with past medical history of cirrhosis and asthma who presents to the ED complaining of nausea and vomiting.  Patient reports she developed persistent vomiting overnight and has been unable to keep down either liquids or solids since then.  She states that she has been vomiting blood and that her emesis looks like coffee grounds.  She has not had any bowel movement since the onset of symptoms, denies diarrhea or blood in her stool.  She complains of soreness in her chest since the onset of vomiting, denies any abdominal pain.  She has not had any fevers, cough, shortness of breath, dysuria, or hematuria.  She does admit to ongoing alcohol abuse, about 4-5 beers daily with occasional liquor.  She denies any toxic alcohol ingestion.        Past Medical History:  Diagnosis Date  . Asthma   . H/O Kawasaki's disease    as 66 month old child and at 30 years  . History of anemia   . History of blood transfusion 1984  . Leukopenia   . Neutropenia [facility])     Patient Active Problem List   Diagnosis Date Noted  . Alcoholic liver disease, unspecified (HCC) 10/26/2020  . Alcoholic cirrhosis of liver without ascites (HCC)   . Hiatal hernia   . Portal hypertension (HCC)   . Nausea & vomiting 11/10/2019  . 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  . Intractable vomiting 10/01/2019  . Alcohol use, daily 09/30/2019  . Elevated LFTs 09/30/2019  . Thrombocytopenia (HCC) 09/30/2019  . Hypokalemia   . Intractable vomiting with nausea 08/23/2019  . Dehydration   . Food poisoning   . Other neutropenia (HCC) 06/11/2016  . Leukopenia 02/03/2015     Past Surgical History:  Procedure Laterality Date  . ESOPHAGOGASTRODUODENOSCOPY (EGD) WITH PROPOFOL N/A 03/14/2020   Procedure: ESOPHAGOGASTRODUODENOSCOPY (EGD) WITH PROPOFOL;  Surgeon: Pasty Spillers, MD;  [name];  Service: Endoscopy;  [name];    Prior to Admission medications   Medication Sig Start Date End Date Taking? Authorizing Provider  FLUoxetine (PROZAC) 40 MG capsule Take 40 mg by mouth daily.    [provider]  pantoprazole (PROTONIX) 40 MG tablet Take 1 tablet (40 mg total) by mouth daily. 11/13/19 02/11/20  Darlin Priestly, MD  promethazine (PHENERGAN) 25 MG suppository Place 1 suppository (25 mg total) rectally every 6 (six) hours as needed for nausea. 11/15/19 11/14/20  Chinita Pester, FNP    Allergies Patient has no known allergies.  Family History  Problem Relation Age of Onset  . Lupus Mother   . Cancer Paternal Aunt        Breast   . Cancer Paternal Aunt        Breast     Social History Social History   Tobacco Use  . Smoking status: Current Every Day Smoker    Packs/day: 0.20    Years: 15.00    Pack years: 3.00    Types: Cigarettes  . Smokeless tobacco: Never Used  Vaping Use  . Vaping Use: Never used  Substance Use Topics  . Alcohol use: Yes    Alcohol/week: 61.0 standard drinks  Types: 21 Cans of beer, [name]  . Drug use: No    Review of Systems  Constitutional: No fever/chills Eyes: No visual changes. ENT: No sore throat. Cardiovascular: Denies chest pain. Respiratory: Denies shortness of breath. Gastrointestinal: No abdominal pain.  Positive for nausea and vomiting.  No diarrhea.  No constipation. Genitourinary: Negative for dysuria. Musculoskeletal: Negative for back pain. Skin: Negative for rash. Neurological: Negative for headaches, focal weakness or numbness.  ____________________________________________   PHYSICAL EXAM:  VITAL SIGNS: ED  Triage Vitals [10/26/20 0937]  Enc Vitals Group     BP (!) 132/102     Pulse Rate (!) 140     Resp 18     Temp 97.9 F (36.6 C)     Temp Source Oral     SpO2 98 %     Weight      Height      Head Circumference      Peak Flow      Pain Score      Pain Loc      Pain Edu?      Excl. in GC?     Constitutional: Alert and oriented. Eyes: Conjunctivae are normal. Head: Atraumatic. Nose: No congestion/rhinnorhea. Mouth/Throat: Mucous membranes are dry. Neck: Normal ROM Cardiovascular: Tachycardic, regular rhythm. Grossly normal heart sounds. Respiratory: Normal respiratory effort.  No retractions. Lungs CTAB. Gastrointestinal: Soft and nontender. No distention.  Emesis bag with coffee-ground emesis. Genitourinary: deferred Musculoskeletal: No lower extremity tenderness nor edema. Neurologic:  Normal speech and language. No gross focal neurologic deficits are appreciated. Skin:  Skin is warm, dry and intact. No rash noted. Psychiatric: Mood and affect are normal. Speech and behavior are normal.  ____________________________________________   LABS (all labs ordered are listed, but only abnormal results are displayed)  Labs Reviewed  COMPREHENSIVE METABOLIC PANEL - Abnormal; Notable for the following components:      Result Value   CO2 12 (*)    Glucose, Bld 169 (*)    Creatinine, Ser 1.39 (*)    Total Protein 10.0 (*)    Albumin 5.3 (*)    AST 283 (*)    ALT 131 (*)    Total Bilirubin 1.6 (*)    GFR, Estimated 50 (*)    Anion gap 27 (*)    All other components within normal limits  CBC - Abnormal; Notable for the following components:   WBC 16.5 (*)    RBC 5.15 (*)    HCT 46.1 (*)    RDW 16.6 (*)    All other components within normal limits  RESP PANEL BY RT-PCR (FLU A&B, COVID) ARPGX2  HCG, QUANTITATIVE, PREGNANCY  LIPASE, BLOOD  OSMOLALITY  PROTIME-INR  LACTIC ACID, PLASMA  LACTIC ACID, PLASMA  POC URINE PREG, ED  TYPE AND SCREEN     PROCEDURES  Procedure(s) performed (including Critical Care):  Procedures  ED ECG REPORT I, Chesley Noon, the attending physician, personally viewed and interpreted this ECG.   Date: 10/26/2020  EKG Time: 10:52  Rate: 129  Rhythm: sinus tachycardia  Axis: Normal  Intervals:none  ST&T Change: None  ____________________________________________   INITIAL IMPRESSION / ASSESSMENT AND PLAN / ED COURSE       40 year old female with past medical history of cirrhosis and asthma who presents to the ED complaining of consistent nausea and vomiting starting last night with concern for hematemesis.  Patient does appear to have coffee-ground emesis in bag  but no obvious bright red blood.  Hemoglobin stable from previous, patient does appear dehydrated there is likely some hemoconcentration.  We will need to trend hemoglobin and patient would benefit from admission given she is high risk for upper GI bleed.  LFTs remain elevated similar to previous with her history of cirrhosis, we will add on INR.  Labs also remarkable for anion gap acidosis, we will add on osmolality but patient denies toxic alcohol consumption.  This may be related to her liver disease and decreased ability to clear lactic acid.  Case discussed with Dr. Maximino Greenland of GI, who will evaluate patient and determine need for endoscopy.  Patient states that she has not had alcohol for 2 days, given her tachycardia I am also concerned that she could be developing withdrawal.  We will treat with Ativan and continue IV fluid hydration.  Case discussed with hospitalist for admission.      ____________________________________________   FINAL CLINICAL IMPRESSION(S) / ED DIAGNOSES  Final diagnoses:  Hematemesis with nausea  Alcoholic cirrhosis of liver without ascites [facility])     ED Discharge Orders    None       Note:  This document was prepared using Dragon voice recognition software and may include unintentional  dictation errors.   Chesley Noon, MD 10/26/20 [phone]

## 2020-10-26 NOTE — Consult Note (Signed)
[facility]  CONSULT NOTE    Date: 10/26/2020                  Patient Name:  Yvette [name]  MRN: [id number]  DOB: 01-Oct-1980  Age / Sex: 40 y.o., female         PCP: [facility], [facility]                 Service Requesting Consult: Dr. Joylene Igo                 Reason for Consult: Acute Kidney Injury             History of Present Illness: [name] presents to ED with nausea, vomiting and GI bleed. Patient unable to give history. Father, Yvette [name], was available over the phone but was unable to give much history. Patient has history of alcohol abuse and found to have serum osm gap and anion gap. Concerning for ethyl alcohol ingestion. Family denies any suicidal ideations.    Medications: Outpatient medications: (Not in a hospital admission)   Current medications: Current Facility-Administered Medications  Medication Dose Route Frequency Provider Last Rate Last Admin  . dextrose 5 % and 0.45% NaCl 1,000 mL with thiamine 100 mg, folic acid 1 mg, multivitamins adult 10 mL infusion   Intravenous Once Agbata, Tochukwu, MD      . LORazepam (ATIVAN) injection 0-4 mg  0-4 mg Intravenous Q6H Agbata, Tochukwu, MD       Followed by  . [START ON 10/28/2020] LORazepam (ATIVAN) injection 0-4 mg  0-4 mg Intravenous Q12H Agbata, Tochukwu, MD      . LORazepam (ATIVAN) tablet 1-4 mg  1-4 mg Oral Q1H PRN Agbata, Tochukwu, MD       Or  . LORazepam (ATIVAN) injection 1-4 mg  1-4 mg Intravenous Q1H PRN Agbata, Tochukwu, MD      . ondansetron (ZOFRAN) tablet 4 mg  4 mg Oral Q6H PRN Agbata, Tochukwu, MD       Or  . ondansetron (ZOFRAN) injection 4 mg  4 mg Intravenous Q6H PRN Agbata, Tochukwu, MD      . Melene Muller ON 10/27/2020] pantoprazole (PROTONIX) injection 40 mg  40 mg Intravenous Q24H Agbata, Tochukwu, MD       Current Outpatient Medications  Medication Sig Dispense Refill  . FLUoxetine (PROZAC) 40 MG capsule Take 40 mg by mouth daily.    Marland Kitchen ibuprofen  (ADVIL) 800 MG tablet Take 800 mg by mouth every 6 (six) hours as needed.    . pantoprazole (PROTONIX) 40 MG tablet Take 1 tablet (40 mg total) by mouth daily. 30 tablet 2  . promethazine (PHENERGAN) 25 MG suppository Place 1 suppository (25 mg total) rectally every 6 (six) hours as needed for nausea. 12 suppository 1      Allergies: No Known Allergies    Past Medical History: Past Medical History:  Diagnosis Date  . Asthma   . 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 History of anemia   . History of blood transfusion 1984  . Leukopenia   . Neutropenia [facility])      Past Surgical History: Past Surgical History:  Procedure Laterality Date  . ESOPHAGOGASTRODUODENOSCOPY (EGD) WITH PROPOFOL N/A 03/14/2020   Procedure: ESOPHAGOGASTRODUODENOSCOPY (EGD) WITH PROPOFOL;  Surgeon: Pasty Spillers, MD;  [name];  Service: Endoscopy;  [name];     Family History: Family History  Problem Relation Age of Onset  . Lupus Mother   . Cancer Paternal Aunt        Breast   . Cancer Paternal Aunt        Breast      Social History: Social History   Socioeconomic History  . Marital status: Single    Spouse name: Not on file  . Number of children: Not on file  . Years of education: Not on file  . Highest education level: Not on file  Occupational History  . Not on file  Tobacco Use  . Smoking status: Current Every Day Smoker    Packs/day: 0.20    Years: 15.00    Pack years: 3.00    Types: Cigarettes  . Smokeless tobacco: Never Used  Vaping Use  . Vaping Use: Never used  Substance and Sexual Activity  . Alcohol use: Yes    Alcohol/week: 61.0 standard drinks    Types: 21 Cans of beer, [name]  . Drug use: No  . Sexual activity: Yes    Partners: Male    Birth control/protection: Injection  Other Topics Concern  . Not on file  Social History Narrative  . Not on file    Social Determinants of Health   Financial Resource Strain: Not on file  Food Insecurity: Not on file  Transportation Needs: Not on file  Physical Activity: Not on file  Stress: Not on file  Social Connections: Not on file  Intimate Partner Violence: Not on file     Review of Systems: Review of Systems  Unable to perform ROS: Mental status change    Vital Signs: Blood pressure (!) 143/99, pulse (!) 115, temperature 97.9 F (36.6 C), temperature source Oral, resp. rate 20, weight 45 kg, SpO2 100 %.  Weight trends: Filed Weights   10/26/20 1102  Weight: 45 kg    Physical Exam: General: NAD, laying in bed  Head: Normocephalic, atraumatic. Moist oral mucosal membranes  Eyes: Anicteric, PERRL  Neck: Supple, trachea midline  Lungs:  Clear to auscultation  Heart: Regular rate and rhythm  Abdomen:  Soft, nontender,   Extremities: no peripheral edema.  Neurologic: obtunded  Skin: No lesions         Lab results: Basic Metabolic Panel: Recent Labs  Lab 10/26/20 0938  NA 137  K 4.9  CL 98  CO2 12*  GLUCOSE 169*  BUN 17  CREATININE 1.39*  CALCIUM 9.6    Liver Function Tests: Recent Labs  Lab 10/26/20 0938  AST 283*  ALT 131*  ALKPHOS 118  BILITOT 1.6*  PROT 10.0*  ALBUMIN 5.3*   Recent Labs  Lab 10/26/20 0938  LIPASE 21   No results for input(s): AMMONIA in the last 168 hours.  CBC: Recent Labs  Lab 10/26/20 0938  WBC 16.5*  HGB 14.5  HCT 46.1*  MCV 89.5  PLT 202    Cardiac Enzymes: No results for input(s): CKTOTAL, CKMB, CKMBINDEX, TROPONINI in the last 168 hours.  BNP: Invalid input(s): POCBNP  CBG: No results for input(s): GLUCAP in the last 168 hours.  Microbiology: Results for orders placed or performed during the hospital encounter of 10/26/20  Resp Panel by RT-PCR (Flu A&B, Covid) Nasopharyngeal Swab     Status: None   Collection Time: 10/26/20 10:31 AM   Specimen: Nasopharyngeal Swab; Nasopharyngeal(NP) swabs in vial  transport medium  Result Value Ref Range Status   SARS Coronavirus 2  by RT PCR NEGATIVE NEGATIVE Final    Comment: (NOTE) SARS-CoV-2 target nucleic acids are NOT DETECTED.  The SARS-CoV-2 RNA is generally detectable in upper respiratory specimens during the acute phase of infection. The lowest concentration of SARS-CoV-2 viral copies this assay can detect is 138 copies/mL. A negative result does not preclude SARS-Cov-2 infection and should not be used as the sole basis for treatment or other patient management decisions. A negative result may occur with  improper specimen collection/handling, submission of specimen other than nasopharyngeal swab, presence of viral mutation(s) within the areas targeted by this assay, and inadequate number of viral copies(<138 copies/mL). A negative result must be combined with clinical observations, patient history, and epidemiological information. The expected result is Negative.  Fact Sheet for Patients:  BloggerCourse.com  Fact Sheet for Healthcare Providers:  SeriousBroker.it  This test is no t yet approved or cleared by the [place] FDA and  has been authorized for detection and/or diagnosis of SARS-CoV-2 by FDA under an Emergency Use Authorization (EUA). This EUA will remain  in effect (meaning this test can be used) for the duration of the COVID-19 declaration under Section 564(b)(1) of the Act, 21 U.S.C.section 360bbb-3(b)(1), unless the authorization is terminated  or revoked sooner.       Influenza A by PCR NEGATIVE NEGATIVE Final   Influenza B by PCR NEGATIVE NEGATIVE Final    Comment: (NOTE) The Xpert Xpress SARS-CoV-2/FLU/RSV plus assay is intended as an aid in the diagnosis of influenza from Nasopharyngeal swab specimens and should not be used as a sole basis for treatment. Nasal washings and aspirates are unacceptable for Xpert Xpress SARS-CoV-2/FLU/RSV testing.  Fact  Sheet for Patients: BloggerCourse.com  Fact Sheet for Healthcare Providers: SeriousBroker.it  This test is not yet approved or cleared by the [place] FDA and has been authorized for detection and/or diagnosis of SARS-CoV-2 by FDA under an Emergency Use Authorization (EUA). This EUA will remain in effect (meaning this test can be used) for the duration of the COVID-19 declaration under Section 564(b)(1) of the Act, 21 U.S.C. section 360bbb-3(b)(1), unless the authorization is terminated or revoked.  Performed at [facility], [address] [place], [place] [address]     Coagulation Studies: Recent Labs    10/26/20 1031  LABPROT 14.7  INR 1.2    Urinalysis: No results for input(s): COLORURINE, LABSPEC, PHURINE, GLUCOSEU, HGBUR, BILIRUBINUR, KETONESUR, PROTEINUR, UROBILINOGEN, NITRITE, LEUKOCYTESUR in the last 72 hours.  Invalid input(s): APPERANCEUR    Imaging:  No results found.   Assessment & Plan: [name] is a 40 y.o. black female with alcohol abuse, asthma, pancytopenia, liver failure, who was admitted to [facility] on 10/26/2020 for Alcoholic liver disease, unspecified (HCC) [K70.9] Alcohol withdrawal (HCC) [F10.239]  1. Acute kidney injury: baseline creatinine of 0.49. Today 1.39  2. Anion gap acidosis: with osmolar gap. This can be explained by alcohol ingestion but patient has a history of ethyl alcohol ingestion.   - patient will need urgent dialysis.  - Dialysis catheter will need to be placed.  - Orders prepared.  - Discussed risks and benefits with patient's Father, Yvette [name].   LOS: 0 Esequiel Kleinfelter 4/17/202212:47 PM

## 2020-10-26 NOTE — ED Triage Notes (Signed)
Pt comes into the ED via EMS from home with c/o N/V with coffee ground emesis for the past 2 days, #20gRAC, given 4mg  zofran IV given.  CBG202 HR133 145/80 98.1 98%RA

## 2020-10-26 NOTE — Progress Notes (Signed)
1700: Received patient from ER via stretcher. She is pretty drowsy but arousable. She's oriented to self and place and time but she doesn't know why she's in the hospital.   Patient given CHG bath and connected to the monitor. VS and BS checked.   Dialysis nurse arrived after a few minutes to set up and do her dialysis.

## 2020-10-27 DIAGNOSIS — K920 Hematemesis: Secondary | ICD-10-CM

## 2020-10-27 LAB — ETHANOL: Alcohol, Ethyl (B): <10 mg/dL (ref <10)

## 2020-10-27 LAB — BASIC METABOLIC PANEL
Anion gap: 14 (ref 5–15)
BUN: 12 mg/dL (ref 6–20)
CO2: 25 mmol/L (ref 22–32)
Calcium: 9.1 mg/dL (ref 8.9–10.3)
Chloride: 98 mmol/L (ref 98–111)
Creatinine, Ser: 0.6 mg/dL (ref 0.44–1.00)
GFR, Estimated: >60 mL/min (ref >60)
Glucose, Bld: 78 mg/dL (ref 70–99)
Potassium: 3.9 mmol/L (ref 3.5–5.1)
Sodium: 137 mmol/L (ref 135–145)

## 2020-10-27 LAB — COMPREHENSIVE METABOLIC PANEL
ALT: 72 U/L — ABNORMAL HIGH (ref 0–44)
AST: 105 U/L — ABNORMAL HIGH (ref 15–41)
Albumin: 4 g/dL (ref 3.5–5.0)
Alkaline Phosphatase: 75 U/L (ref 38–126)
Anion gap: 12 (ref 5–15)
BUN: 13 mg/dL (ref 6–20)
CO2: 27 mmol/L (ref 22–32)
Calcium: 9.3 mg/dL (ref 8.9–10.3)
Chloride: 96 mmol/L — ABNORMAL LOW (ref 98–111)
Creatinine, Ser: 0.69 mg/dL (ref 0.44–1.00)
GFR, Estimated: >60 mL/min (ref >60)
Glucose, Bld: 93 mg/dL (ref 70–99)
Potassium: 4.4 mmol/L (ref 3.5–5.1)
Sodium: 135 mmol/L (ref 135–145)
Total Bilirubin: 1.2 mg/dL (ref 0.3–1.2)
Total Protein: 7.5 g/dL (ref 6.5–8.1)

## 2020-10-27 LAB — HEMOGLOBIN AND HEMATOCRIT, BLOOD
HCT: 34.2 % — ABNORMAL LOW (ref 36.0–46.0)
Hemoglobin: 11.5 g/dL — ABNORMAL LOW (ref 12.0–15.0)

## 2020-10-27 LAB — CBC
HCT: 33.7 % — ABNORMAL LOW (ref 36.0–46.0)
Hemoglobin: 11.3 g/dL — ABNORMAL LOW (ref 12.0–15.0)
MCH: 28.5 pg (ref 26.0–34.0)
MCHC: 33.5 g/dL (ref 30.0–36.0)
MCV: 85.1 fL (ref 80.0–100.0)
Platelets: 116 10*3/uL — ABNORMAL LOW (ref 150–400)
RBC: 3.96 MIL/uL (ref 3.87–5.11)
RDW: 16.1 % — ABNORMAL HIGH (ref 11.5–15.5)
WBC: 7.2 10*3/uL (ref 4.0–10.5)
nRBC: 0 % (ref 0.0–0.2)

## 2020-10-27 LAB — HEPATITIS B SURFACE ANTIBODY,QUALITATIVE: Hep B S Ab: REACTIVE — AB

## 2020-10-27 LAB — ETHYLENE GLYCOL: Ethylene Glycol Lvl: <5 mg/dL

## 2020-10-27 MED ORDER — PANTOPRAZOLE SODIUM 40 MG PO TBEC
40.0000 mg | DELAYED_RELEASE_TABLET | Freq: Two times a day (BID) | ORAL | Status: DC
  Administered 2020-10-27 – 2020-10-31 (×8): 40 mg via ORAL
  Filled 2020-10-27 (×9): qty 1

## 2020-10-27 MED ORDER — SODIUM CHLORIDE 0.9 % IV SOLN
12.5000 mg | Freq: Four times a day (QID) | INTRAVENOUS | Status: DC | PRN
  Administered 2020-10-27 – 2020-10-30 (×9): 12.5 mg via INTRAVENOUS
  Filled 2020-10-27 (×11): qty 0.5

## 2020-10-27 MED ORDER — ONDANSETRON HCL 4 MG/2ML IJ SOLN
4.0000 mg | Freq: Four times a day (QID) | INTRAMUSCULAR | Status: DC | PRN
  Administered 2020-10-29 – 2020-10-30 (×3): 4 mg via INTRAVENOUS

## 2020-10-27 MED ORDER — ALUM & MAG HYDROXIDE-SIMETH 200-200-20 MG/5ML PO SUSP: 15.0000 mL | Freq: Four times a day (QID) | ORAL | Status: DC | PRN

## 2020-10-27 NOTE — Progress Notes (Signed)
Wyline Mood , MD [address], [place], [place], [address] [address], [place], [place], [address] Phone: [phone]  Fax: [phone]   Yvette [name] is being followed for coffee-ground emesis, abnormal LFTs day 1 of follow up   Subjective: Not had a bowel movement or any further episodes of emesis.    Objective: Vital signs in last 24 hours: Vitals:   10/26/20 1945 10/26/20 2000 10/27/20 0600 10/27/20 0700  BP: 113/77 113/75 107/77 102/75  Pulse: (!) 125 (!) 121 (!) 106 (!) 104  Resp:  (!) 22 17 (!) 28  Temp:  98.9 F (37.2 C)    TempSrc:  Axillary    SpO2: 100% 100% 98% 98%  Weight:       Weight change:   Intake/Output Summary (Last 24 hours) at 10/27/2020 0818 Last data filed at 10/27/2020 0600 Gross per 24 hour  Intake --  Output 516 ml  Net -516 ml     Exam: Heart:: Regular rate and rhythm, S1S2 present or without murmur or extra heart sounds Lungs: normal, clear to auscultation and clear to auscultation and percussion Abdomen: soft, nontender, normal bowel sounds   Lab Results: @LABTEST2 @ Micro Results: Recent Results (from the past 240 hour(s))  Resp Panel by RT-PCR (Flu A&B, Covid) Nasopharyngeal Swab     Status: None   Collection Time: 10/26/20 10:31 AM   Specimen: Nasopharyngeal Swab; Nasopharyngeal(NP) swabs in vial transport medium  Result Value Ref Range Status   SARS Coronavirus 2 by RT PCR NEGATIVE NEGATIVE Final    Comment: (NOTE) SARS-CoV-2 target nucleic acids are NOT DETECTED.  The SARS-CoV-2 RNA is generally detectable in upper respiratory specimens during the acute phase of infection. The lowest concentration of SARS-CoV-2 viral copies this assay can detect is 138 copies/mL. A negative result does not preclude SARS-Cov-2 infection and should not be used as the sole basis for treatment or other patient management decisions. A negative result may occur with  improper specimen collection/handling, submission  of specimen other than nasopharyngeal swab, presence of viral mutation(s) within the areas targeted by this assay, and inadequate number of viral copies(<138 copies/mL). A negative result must be combined with clinical observations, patient history, and epidemiological information. The expected result is Negative.  Fact Sheet for Patients:  10/28/20  Fact Sheet for Healthcare Providers:  BloggerCourse.com  This test is no t yet approved or cleared by the [place] FDA and  has been authorized for detection and/or diagnosis of SARS-CoV-2 by FDA under an Emergency Use Authorization (EUA). This EUA will remain  in effect (meaning this test can be used) for the duration of the COVID-19 declaration under Section 564(b)(1) of the Act, 21 U.S.C.section 360bbb-3(b)(1), unless the authorization is terminated  or revoked sooner.       Influenza A by PCR NEGATIVE NEGATIVE Final   Influenza B by PCR NEGATIVE NEGATIVE Final    Comment: (NOTE) The Xpert Xpress SARS-CoV-2/FLU/RSV plus assay is intended as an aid in the diagnosis of influenza from Nasopharyngeal swab specimens and should not be used as a sole basis for treatment. Nasal washings and aspirates are unacceptable for Xpert Xpress SARS-CoV-2/FLU/RSV testing.  Fact Sheet for Patients: [place]  Fact Sheet for Healthcare Providers: BloggerCourse.com  This test is not yet approved or cleared by the SeriousBroker.[place] FDA and has been authorized for detection and/or diagnosis of SARS-CoV-2 by FDA under an Emergency Use Authorization (EUA). This EUA will remain in effect (meaning this test can  be used) for the duration of the COVID-19 declaration under Section 564(b)(1) of the Act, 21 U.S.C. section 360bbb-3(b)(1), unless the authorization is terminated or revoked.  Performed at [facility], [address] [place], [place] [address]   MRSA PCR Screening     Status: None   Collection Time: 10/26/20  5:07 PM   Specimen: Nasopharyngeal  Result Value Ref Range Status   MRSA by PCR NEGATIVE NEGATIVE Final    Comment:        The GeneXpert MRSA Assay (FDA approved for NASAL specimens only), is one component of a comprehensive MRSA colonization surveillance program. It is not intended to diagnose MRSA infection nor to guide or monitor treatment for MRSA infections. Performed at [facility], [address], [place], [place] [address]    Studies/Results: [place] Chest 1 View  Result Date: 10/26/2020 CLINICAL DATA:  Central line placement. EXAM: CHEST  1 VIEW COMPARISON:  10/26/2020 FINDINGS: RIGHT IJ central line has been placed, tip overlying the level of the LOWER superior vena cava. The heart size is normal. Stable coarse appearance of interstitial markings. There is no pneumothorax. IMPRESSION: Interval placement of RIGHT IJ central line. Electronically Signed   By: Norva Pavlov M.D.   On: 10/26/2020 16:08   DG Chest 1 View  Result Date: 10/26/2020 CLINICAL DATA:  Nausea and vomiting.  Chest pain. EXAM: CHEST  1 VIEW COMPARISON:  None. FINDINGS: Stable cardiomediastinal contours. Chronic coarse bilateral interstitial markings. No new focal consolidation. No pneumothorax or pleural effusion. No acute finding in the visualized skeleton. IMPRESSION: No acute cardiopulmonary process.  Chronic bronchitic changes. Electronically Signed   By: Emmaline Kluver M.D.   On: 10/26/2020 13:30   Medications: I have reviewed the patient's current medications. Scheduled Meds: . Chlorhexidine Gluconate Cloth  6 each Topical Q0600  . Chlorhexidine Gluconate Cloth  6 each Topical Q0600  . LORazepam  0-4 mg Intravenous Q6H   Followed by  . [START ON 10/28/2020] LORazepam  0-4 mg Intravenous Q12H  . pantoprazole (PROTONIX) IV  40 mg Intravenous Q24H   Continuous Infusions: PRN Meds:.LORazepam  **OR** LORazepam, ondansetron **OR** ondansetron (ZOFRAN) IV   Assessment: Principal Problem:   Alcohol withdrawal (HCC) Active Problems:   Alcoholic gastritis   Portal hypertension (HCC)   Alcoholic liver disease, unspecified (HCC)   Alcoholic ketoacidosis   AKI (acute kidney injury) (HCC)   High serum osmolar gap  Yvette [name] 40 y.o. female with a history of daily alcohol use for 3 to 4 years presented with coffee-ground emesis at home.  No melena.  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  There has been a concern for Ethyl alcohol ingestion and the patient has undergone hemodialysis.  Hemoglobin been stable overnight at 11.5 g.  Patient had elevated transaminases on admission.  INR 1.2.  Lactic acidosis was noted.  Acetaminophen level negative  Plan: 1.  Monitor CBC and transfuse if needed.  Hemoglobin has been stable. 2.  Urine drug screen has been ordered await results 3.  Monitor LFTs if declining will watch and if not declining will order further labs 4. Advise to quit alcohol.  5. She does not want to undergo an endoscopic procedure to evaluate for coffee ground emesis. Informed her to let me know if she changes her mind.    LOS: 1 day   Wyline Mood, MD 10/27/2020, 8:18 AM

## 2020-10-27 NOTE — TOC Initial Note (Signed)
Transition of Care [facility]) - Initial/Assessment Note    Patient Details  Name: Yvette [name] MRN: [id number] Date of Birth: 03-Feb-1981  Transition of Care [facility]) CM/SW Contact:    Orwell Cellar, RN Phone Number: 10/27/2020, 9:47 AM  Clinical Narrative:                 TOC received consult for SA resources due to history of alcohol abuse. Will address with patient once stable. Continues to work with HD at this time.         Patient Goals and CMS Choice        Expected Discharge Plan and Services                                                Prior Living Arrangements/Services                       Activities of Daily Living      Permission Sought/Granted                  Emotional Assessment              Admission diagnosis:  Alcohol withdrawal (HCC) [F10.239] SOB (shortness of breath) [R06.02] Encounter for central line placement [Z45.2] Alcoholic cirrhosis of liver without ascites (HCC) [K70.30] AKI (acute kidney injury) (HCC) [N17.9] Hematemesis with nausea [K92.0] Alcoholic liver disease, unspecified (HCC) [K70.9] Patient Active Problem List   Diagnosis Date Noted  . Alcoholic liver disease, unspecified (HCC) 10/26/2020  . Alcohol withdrawal (HCC) 10/26/2020  . Alcoholic ketoacidosis 10/26/2020  . AKI (acute kidney injury) (HCC) 10/26/2020  . High serum osmolar gap 10/26/2020  . Alcoholic cirrhosis of liver without ascites (HCC)   . Hiatal hernia   . Portal hypertension (HCC)   . Nausea & vomiting 11/10/2019  . 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  . Intractable vomiting 10/01/2019  . Alcohol use, daily 09/30/2019  . Elevated LFTs 09/30/2019  . Thrombocytopenia (HCC) 09/30/2019  . Hypokalemia   . Intractable vomiting with nausea 08/23/2019  . Dehydration   . Food poisoning   . Other neutropenia (HCC) 06/11/2016  . Leukopenia 02/03/2015   PCP:  [facility], [facility] Pharmacy:   [facility] -  [place], [place] - [address] [place] [address] Phone: [phone] Fax: [phone]     Social Determinants of Health (SDOH) Interventions    Readmission Risk Interventions No flowsheet data found.

## 2020-10-27 NOTE — Progress Notes (Signed)
PROGRESS NOTE    Yvette [name]  XHB:[id number] DOB: 1980-07-27 DOA: 10/26/2020 PCP: [facility]  Brief Narrative: 40 year old female with history of alcohol abuse presented to the ED with persistent nausea and vomiting, in the ED she was obtunded and poorly responsive, after getting Ativan for tremors and suspected withdrawal. -Upon evaluation in the ED she was noted to have anion gap metabolic acidosis with osmolar gap hence Ethyl alcohol ingestion was suspected based on prior hospitalization with this in 2019.  Nephrology was consulted, she had a HD catheter placed and underwent 2 hours of hemodialysis overnight.   Assessment & Plan:   Anion gap metabolic acidosis Osmolar gap Alcoholic ketoacidosis -Suspect this is secondary to alcohol intoxication, AKI, starvation etc. 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 -Supportive care, alcohol cessation counseled  Leukocytosis -Reactive, resolved  DVT prophylaxis: SCDs Code Status: Full code Family Communication: No family at bedside Disposition Plan:  Status is: Inpatient  Remains inpatient appropriate because:Inpatient level of care appropriate due to severity of illness   Dispo: The patient is from: Home              Anticipated d/c is to: Home              Patient currently is not medically stable to d/c.   Difficult to  place patient No   Consultants:   Nephrology   Procedures: HD catheter 4/17 and hemodialysis  Antimicrobials:    Subjective: -Denies using anything apart from alcohol in the last week  Objective: Vitals:   10/27/20 0700 10/27/20 0800 10/27/20 0900 10/27/20 1000  BP: 102/75 113/64 109/77 128/85  Pulse: (!) 104 92 (!) 107 84  Resp: (!) 28 11 (!) 29 16  Temp:  98 F (36.7 C)    TempSrc:  Axillary    SpO2: 98% 100% 98% 99%  Weight:        Intake/Output Summary (Last 24 hours) at 10/27/2020 1127 Last data filed at 10/27/2020 0600 Gross per 24 hour  Intake --  Output 516 ml  Net -516 ml   Filed Weights   10/26/20 1102 10/26/20 1704  Weight: 45 kg 49.1 kg    Examination:  General exam: Awake alert, oriented to self, place and partly to time, sitting up in bed, chronically ill-appearing, no distress HEENT: Right IJ HD catheter noted CVS: S1-S2, regular rate rhythm Lungs: Poor air movement bilaterally Abdomen: Soft, nontender, bowel sounds present Extremities: No edema Neuro: Moves all extremities, no localizing signs   Data Reviewed:   CBC: Recent Labs  Lab 10/26/20 0938 10/26/20 1228 10/26/20 1752 10/27/20 0438 10/27/20 0842  WBC 16.5*  --   --   --  7.2  HGB 14.5 13.9 11.8* 11.5* 11.3*  HCT 46.1* 42.4 35.0* 34.2* 33.7*  MCV 89.5  --   --   --  85.1  PLT 202  --   --   --  116*   Basic Metabolic Panel: Recent Labs  Lab 10/26/20 0938 10/26/20 1234  10/27/20 0438 10/27/20 0842  NA 137  --  137 135  K 4.9  --  3.9 4.4  CL 98  --  98 96*  CO2 12*  --  25 27  GLUCOSE 169*  --  78 93  BUN 17  --  12 13  CREATININE 1.39*  --  0.60 0.69  CALCIUM 9.6  --  9.1 9.3  MG  --  1.7  --   --   PHOS  --  2.2*  --   --    GFR: CrCl cannot be calculated (Unknown ideal weight.). Liver Function Tests: Recent Labs  Lab 10/26/20 0938 10/27/20 0842  AST 283* 105*  ALT 131* 72*  ALKPHOS 118 75  BILITOT 1.6* 1.2  PROT 10.0* 7.5  ALBUMIN 5.3* 4.0   Recent  Labs  Lab 10/26/20 0938  LIPASE 21   No results for input(s): AMMONIA in the last 168 hours. Coagulation Profile: Recent Labs  Lab 10/26/20 1031  INR 1.2   Cardiac Enzymes: No results for input(s): CKTOTAL, CKMB, CKMBINDEX, TROPONINI in the last 168 hours. BNP (last 3 results) No results for input(s): PROBNP in the last 8760 hours. HbA1C: No results for input(s): HGBA1C in the last 72 hours. CBG: Recent Labs  Lab 10/26/20 1708  GLUCAP 137*   Lipid Profile: No results for input(s): CHOL, HDL, LDLCALC, TRIG, CHOLHDL, LDLDIRECT in the last 72 hours. Thyroid Function Tests: No results for input(s): TSH, T4TOTAL, FREET4, T3FREE, THYROIDAB in the last 72 hours. Anemia Panel: No results for input(s): VITAMINB12, FOLATE, FERRITIN, TIBC, IRON, RETICCTPCT in the last 72 hours. Urine analysis:    Component Value Date/Time   COLORURINE YELLOW (A) 11/09/2019 0240   APPEARANCEUR CLEAR (A) 11/09/2019 0240   LABSPEC 1.020 11/09/2019 0240   PHURINE 6.0 11/09/2019 0240   GLUCOSEU NEGATIVE 11/09/2019 0240   HGBUR SMALL (A) 11/09/2019 0240   BILIRUBINUR NEGATIVE 11/09/2019 0240   KETONESUR 80 (A) 11/09/2019 0240   PROTEINUR 100 (A) 11/09/2019 0240   NITRITE NEGATIVE 11/09/2019 0240   LEUKOCYTESUR NEGATIVE 11/09/2019 0240   Sepsis Labs: @LABRCNTIP (procalcitonin:4,lacticidven:4)  ) Recent Results (from the past 240 hour(s))  Resp Panel by RT-PCR (Flu A&B, Covid) Nasopharyngeal Swab     Status: None   Collection Time: 10/26/20 10:31 AM   Specimen: Nasopharyngeal Swab; Nasopharyngeal(NP) swabs in vial transport medium  Result Value Ref Range Status   SARS Coronavirus 2 by RT PCR NEGATIVE NEGATIVE Final    Comment: (NOTE) SARS-CoV-2 target nucleic acids are NOT DETECTED.  The SARS-CoV-2 RNA is generally detectable in upper respiratory specimens during the acute phase of infection. The lowest concentration of SARS-CoV-2 viral copies this assay can detect is 138 copies/mL. A  negative result does not preclude SARS-Cov-2 infection and should not be used as the sole basis for treatment or other patient management decisions. A negative result may occur with  improper specimen collection/handling, submission of specimen other than nasopharyngeal swab, presence of viral mutation(s) within the areas targeted by this assay, and inadequate number of viral copies(<138 copies/mL). A negative result must be combined with clinical observations, patient history, and epidemiological information. The expected result is Negative.  Fact Sheet for Patients:  10/28/20  Fact Sheet for Healthcare Providers:  BloggerCourse.com  This test is no t yet approved or cleared by the [place] FDA and  has been authorized for detection and/or diagnosis of SARS-CoV-2 by FDA under an Emergency Use Authorization (EUA). This EUA will remain  in effect (meaning  this test can be used) for the duration of the COVID-19 declaration under Section 564(b)(1) of the Act, 21 U.S.C.section 360bbb-3(b)(1), unless the authorization is terminated  or revoked sooner.       Influenza A by PCR NEGATIVE NEGATIVE Final   Influenza B by PCR NEGATIVE NEGATIVE Final    Comment: (NOTE) The Xpert Xpress SARS-CoV-2/FLU/RSV plus assay is intended as an aid in the diagnosis of influenza from Nasopharyngeal swab specimens and should not be used as a sole basis for treatment. Nasal washings and aspirates are unacceptable for Xpert Xpress SARS-CoV-2/FLU/RSV testing.  Fact Sheet for Patients: BloggerCourse.com  Fact Sheet for Healthcare Providers: SeriousBroker.it  This test is not yet approved or cleared by the [place] FDA and has been authorized for detection and/or diagnosis of SARS-CoV-2 by FDA under an Emergency Use Authorization (EUA). This EUA will remain in effect (meaning this test can  be used) for the duration of the COVID-19 declaration under Section 564(b)(1) of the Act, 21 U.S.C. section 360bbb-3(b)(1), unless the authorization is terminated or revoked.  Performed at [facility], [address] [place], [place] [address]   MRSA PCR Screening     Status: None   Collection Time: 10/26/20  5:07 PM   Specimen: Nasopharyngeal  Result Value Ref Range Status   MRSA by PCR NEGATIVE NEGATIVE Final    Comment:        The GeneXpert MRSA Assay (FDA approved for NASAL specimens only), is one component of a comprehensive MRSA colonization surveillance program. It is not intended to diagnose MRSA infection nor to guide or monitor treatment for MRSA infections. Performed at [facility], [address] [place], [place] [address]          Radiology Studies: DG Chest 1 View  Result Date: 10/26/2020 CLINICAL DATA:  Central line placement. EXAM: CHEST  1 VIEW COMPARISON:  10/26/2020 FINDINGS: RIGHT IJ central line has been placed, tip overlying the level of the LOWER superior vena cava. The heart size is normal. Stable coarse appearance of interstitial markings. There is no pneumothorax. IMPRESSION: Interval placement of RIGHT IJ central line. Electronically Signed   By: Norva Pavlov M.D.   On: 10/26/2020 16:08   DG Chest 1 View  Result Date: 10/26/2020 CLINICAL DATA:  Nausea and vomiting.  Chest pain. EXAM: CHEST  1 VIEW COMPARISON:  None. FINDINGS: Stable cardiomediastinal contours. Chronic coarse bilateral interstitial markings. No new focal consolidation. No pneumothorax or pleural effusion. No acute finding in the visualized skeleton. IMPRESSION: No acute cardiopulmonary process.  Chronic bronchitic changes. Electronically Signed   By: Emmaline Kluver M.D.   On: 10/26/2020 13:30        Scheduled Meds: . Chlorhexidine Gluconate Cloth  6 each Topical Q0600  . Chlorhexidine Gluconate Cloth  6 each Topical Q0600  . LORazepam  0-4 mg  Intravenous Q6H   Followed by  . [START ON 10/28/2020] LORazepam  0-4 mg Intravenous Q12H  . pantoprazole  40 mg Oral BID   Continuous Infusions:   LOS: 1 day    Time spent:    Zannie Cove, MD [facility]  10/27/2020, 11:27 AM

## 2020-10-27 NOTE — Progress Notes (Signed)
[facility]  ROUNDING NOTE   Subjective:   Anion gap and osm gap closed  Emergent hemodialysis treatment yesterday. 2 hour treatment.   Patient more awake and conversive this morning. Denies ingestion of anything but alcohol.   Objective:  Vital signs in last 24 hours:  Temp:  [98 F (36.7 C)-100 F (37.8 C)] 98 F (36.7 C) (04/18 0800) Pulse Rate:  [84-125] 84 (04/18 1000) Resp:  [11-29] 16 (04/18 1000) BP: (102-143)/(64-103) 128/85 (04/18 1000) SpO2:  [98 %-100 %] 99 % (04/18 1000) Weight:  [49.1 kg] 49.1 kg (04/17 1704)  Weight change:  Filed Weights   10/26/20 1102 10/26/20 1704  Weight: 45 kg 49.1 kg    Intake/Output: I/O last 3 completed shifts: In: -  Out: 516 [Urine:900]   Intake/Output this shift:  No intake/output data recorded.  Physical Exam: General: NAD, laying in bed  Head: Normocephalic, atraumatic. Moist oral mucosal membranes  Eyes: Anicteric, PERRL  Neck: Supple, trachea midline  Lungs:  Clear to auscultation  Heart: Regular rate and rhythm  Abdomen:  Soft, nontender,   Extremities:  no peripheral edema.  Neurologic: Nonfocal, moving all four extremities  Skin: No lesions  Access: Temp femoral HD catheter 4/17    Basic Metabolic Panel: Recent Labs  Lab 10/26/20 0938 10/26/20 1234 10/27/20 0438 10/27/20 0842  NA 137  --  137 135  K 4.9  --  3.9 4.4  CL 98  --  98 96*  CO2 12*  --  25 27  GLUCOSE 169*  --  78 93  BUN 17  --  12 13  CREATININE 1.39*  --  0.60 0.69  CALCIUM 9.6  --  9.1 9.3  MG  --  1.7  --   --   PHOS  --  2.2*  --   --     Liver Function Tests: Recent Labs  Lab 10/26/20 0938 10/27/20 0842  AST 283* 105*  ALT 131* 72*  ALKPHOS 118 75  BILITOT 1.6* 1.2  PROT 10.0* 7.5  ALBUMIN 5.3* 4.0   Recent Labs  Lab 10/26/20 0938  LIPASE 21   No results for input(s): AMMONIA in the last 168 hours.  CBC: Recent Labs  Lab 10/26/20 0938 10/26/20 1228 10/26/20 1752 10/27/20 0438 10/27/20 0842   WBC 16.5*  --   --   --  7.2  HGB 14.5 13.9 11.8* 11.5* 11.3*  HCT 46.1* 42.4 35.0* 34.2* 33.7*  MCV 89.5  --   --   --  85.1  PLT 202  --   --   --  116*    Cardiac Enzymes: No results for input(s): CKTOTAL, CKMB, CKMBINDEX, TROPONINI in the last 168 hours.  BNP: Invalid input(s): POCBNP  CBG: Recent Labs  Lab 10/26/20 1708  GLUCAP 137*    Microbiology: Results for orders placed or performed during the hospital encounter of 10/26/20  Resp Panel by RT-PCR (Flu A&B, Covid) Nasopharyngeal Swab     Status: None   Collection Time: 10/26/20 10:31 AM   Specimen: Nasopharyngeal Swab; Nasopharyngeal(NP) swabs in vial transport medium  Result Value Ref Range Status   SARS Coronavirus 2 by RT PCR NEGATIVE NEGATIVE Final    Comment: (NOTE) SARS-CoV-2 target nucleic acids are NOT DETECTED.  The SARS-CoV-2 RNA is generally detectable in upper respiratory specimens during the acute phase of infection. The lowest concentration of SARS-CoV-2 viral copies this assay can detect is 138 copies/mL. A negative result does not preclude SARS-Cov-2 infection  and should not be used as the sole basis for treatment or other patient management decisions. A negative result may occur with  improper specimen collection/handling, submission of specimen other than nasopharyngeal swab, presence of viral mutation(s) within the areas targeted by this assay, and inadequate number of viral copies(<138 copies/mL). A negative result must be combined with clinical observations, patient history, and epidemiological information. The expected result is Negative.  Fact Sheet for Patients:  BloggerCourse.com  Fact Sheet for Healthcare Providers:  SeriousBroker.it  This test is no t yet approved or cleared by the [place] FDA and  has been authorized for detection and/or diagnosis of SARS-CoV-2 by FDA under an Emergency Use Authorization (EUA). This EUA will  remain  in effect (meaning this test can be used) for the duration of the COVID-19 declaration under Section 564(b)(1) of the Act, 21 U.S.C.section 360bbb-3(b)(1), unless the authorization is terminated  or revoked sooner.       Influenza A by PCR NEGATIVE NEGATIVE Final   Influenza B by PCR NEGATIVE NEGATIVE Final    Comment: (NOTE) The Xpert Xpress SARS-CoV-2/FLU/RSV plus assay is intended as an aid in the diagnosis of influenza from Nasopharyngeal swab specimens and should not be used as a sole basis for treatment. Nasal washings and aspirates are unacceptable for Xpert Xpress SARS-CoV-2/FLU/RSV testing.  Fact Sheet for Patients: BloggerCourse.com  Fact Sheet for Healthcare Providers: SeriousBroker.it  This test is not yet approved or cleared by the [place] FDA and has been authorized for detection and/or diagnosis of SARS-CoV-2 by FDA under an Emergency Use Authorization (EUA). This EUA will remain in effect (meaning this test can be used) for the duration of the COVID-19 declaration under Section 564(b)(1) of the Act, 21 U.S.C. section 360bbb-3(b)(1), unless the authorization is terminated or revoked.  Performed at [facility], [address] [place], [place] [address]   MRSA PCR Screening     Status: None   Collection Time: 10/26/20  5:07 PM   Specimen: Nasopharyngeal  Result Value Ref Range Status   MRSA by PCR NEGATIVE NEGATIVE Final    Comment:        The GeneXpert MRSA Assay (FDA approved for NASAL specimens only), is one component of a comprehensive MRSA colonization surveillance program. It is not intended to diagnose MRSA infection nor to guide or monitor treatment for MRSA infections. Performed at [facility], [address] [place], [place] [address]     Coagulation Studies: Recent Labs    10/26/20 1031  LABPROT 14.7  INR 1.2    Urinalysis: No results for  input(s): COLORURINE, LABSPEC, PHURINE, GLUCOSEU, HGBUR, BILIRUBINUR, KETONESUR, PROTEINUR, UROBILINOGEN, NITRITE, LEUKOCYTESUR in the last 72 hours.  Invalid input(s): APPERANCEUR    Imaging: DG Chest 1 View  Result Date: 10/26/2020 CLINICAL DATA:  Central line placement. EXAM: CHEST  1 VIEW COMPARISON:  10/26/2020 FINDINGS: RIGHT IJ central line has been placed, tip overlying the level of the LOWER superior vena cava. The heart size is normal. Stable coarse appearance of interstitial markings. There is no pneumothorax. IMPRESSION: Interval placement of RIGHT IJ central line. Electronically Signed   By: Norva Pavlov M.D.   On: 10/26/2020 16:08   DG Chest 1 View  Result Date: 10/26/2020 CLINICAL DATA:  Nausea and vomiting.  Chest pain. EXAM: CHEST  1 VIEW COMPARISON:  None. FINDINGS: Stable cardiomediastinal contours. Chronic coarse bilateral interstitial markings. No new focal consolidation. No pneumothorax or pleural effusion. No acute finding in the visualized skeleton. IMPRESSION:  No acute cardiopulmonary process.  Chronic bronchitic changes. Electronically Signed   By: Emmaline Kluver M.D.   On: 10/26/2020 13:30     Medications:    . Chlorhexidine Gluconate Cloth  6 each Topical Q0600  . Chlorhexidine Gluconate Cloth  6 each Topical Q0600  . LORazepam  0-4 mg Intravenous Q6H   Followed by  . [START ON 10/28/2020] LORazepam  0-4 mg Intravenous Q12H  . pantoprazole  40 mg Oral BID   alum & mag hydroxide-simeth, LORazepam **OR** LORazepam, ondansetron **OR** ondansetron (ZOFRAN) IV, ondansetron (ZOFRAN) IV  Assessment/ Plan:  [name] is a 40 y.o. black female with alcohol abuse, asthma, pancytopenia, liver failure, who was admitted to [facility] on 10/26/2020 for Alcoholic liver disease, unspecified (HCC) [K70.9] Alcohol withdrawal (HCC) [F10.239]  1. Acute kidney injury: baseline creatinine of 0.49. Status post 1 treatment hemodialysis. No indication for further  treatment - Dialysis catheter will need to be removed if continues to recovery.   2. Anion gap acidosis: with osmolar gap. Osmolar gap and anion gap have closed.    LOS: 1 Chanette Demo 4/18/202211:23 AM

## 2020-10-28 LAB — VOLATILES,BLD-ACETONE,ETHANOL,ISOPROP,METHANOL
Acetone, blood: 0.022 g/dL — ABNORMAL HIGH (ref 0.000–0.010)
Ethanol, blood: <0.01 g/dL (ref 0.000–0.010)
Isopropanol, blood: <0.01 g/dL (ref 0.000–0.010)
Methanol, blood: <0.01 g/dL (ref 0.000–0.010)

## 2020-10-28 LAB — URINE DRUG SCREEN, QUALITATIVE (ARMC ONLY)
Amphetamines, Ur Screen: NOT DETECTED
Barbiturates, Ur Screen: NOT DETECTED
Benzodiazepine, Ur Scrn: POSITIVE — AB
Cannabinoid 50 Ng, Ur ~~LOC~~: NOT DETECTED
Cocaine Metabolite,Ur ~~LOC~~: NOT DETECTED
MDMA (Ecstasy)Ur Screen: NOT DETECTED
Methadone Scn, Ur: NOT DETECTED
Opiate, Ur Screen: NOT DETECTED
Phencyclidine (PCP) Ur S: NOT DETECTED
Tricyclic, Ur Screen: NOT DETECTED

## 2020-10-28 LAB — CBC
HCT: 33.4 % — ABNORMAL LOW (ref 36.0–46.0)
Hemoglobin: 11.3 g/dL — ABNORMAL LOW (ref 12.0–15.0)
MCH: 28.5 pg (ref 26.0–34.0)
MCHC: 33.8 g/dL (ref 30.0–36.0)
MCV: 84.3 fL (ref 80.0–100.0)
Platelets: 112 10*3/uL — ABNORMAL LOW (ref 150–400)
RBC: 3.96 MIL/uL (ref 3.87–5.11)
RDW: 15.9 % — ABNORMAL HIGH (ref 11.5–15.5)
WBC: 5.2 10*3/uL (ref 4.0–10.5)
nRBC: 0 % (ref 0.0–0.2)

## 2020-10-28 LAB — COMPREHENSIVE METABOLIC PANEL
ALT: 58 U/L — ABNORMAL HIGH (ref 0–44)
AST: 78 U/L — ABNORMAL HIGH (ref 15–41)
Albumin: 4 g/dL (ref 3.5–5.0)
Alkaline Phosphatase: 81 U/L (ref 38–126)
Anion gap: 16 — ABNORMAL HIGH (ref 5–15)
BUN: 12 mg/dL (ref 6–20)
CO2: 26 mmol/L (ref 22–32)
Calcium: 9.5 mg/dL (ref 8.9–10.3)
Chloride: 98 mmol/L (ref 98–111)
Creatinine, Ser: 0.58 mg/dL (ref 0.44–1.00)
GFR, Estimated: >60 mL/min (ref >60)
Glucose, Bld: 85 mg/dL (ref 70–99)
Potassium: 3.2 mmol/L — ABNORMAL LOW (ref 3.5–5.1)
Sodium: 140 mmol/L (ref 135–145)
Total Bilirubin: 1.1 mg/dL (ref 0.3–1.2)
Total Protein: 7.4 g/dL (ref 6.5–8.1)

## 2020-10-28 LAB — URINALYSIS, COMPLETE (UACMP) WITH MICROSCOPIC
Bacteria, UA: NONE SEEN
Bilirubin Urine: NEGATIVE
Glucose, UA: NEGATIVE mg/dL
Ketones, ur: 80 mg/dL — AB
Leukocytes,Ua: NEGATIVE
Nitrite: NEGATIVE
Protein, ur: 100 mg/dL — AB
RBC / HPF: >50 RBC/hpf — ABNORMAL HIGH (ref 0–5)
Specific Gravity, Urine: 1.025 (ref 1.005–1.030)
pH: 6 (ref 5.0–8.0)

## 2020-10-28 MED ORDER — DIAZEPAM 5 MG PO TABS
5.0000 mg | ORAL_TABLET | Freq: Two times a day (BID) | ORAL | Status: DC
  Administered 2020-10-28: 5 mg via ORAL
  Filled 2020-10-28: qty 1

## 2020-10-28 MED ORDER — SODIUM CHLORIDE 0.9 % IV SOLN: INTRAVENOUS | Status: DC

## 2020-10-28 MED ORDER — POTASSIUM CHLORIDE 20 MEQ PO PACK
40.0000 meq | PACK | Freq: Once | ORAL | Status: AC
  Administered 2020-10-28: 40 meq via ORAL
  Filled 2020-10-28: qty 2

## 2020-10-28 MED ORDER — DIAZEPAM 5 MG PO TABS
5.0000 mg | ORAL_TABLET | Freq: Two times a day (BID) | ORAL | Status: AC
  Administered 2020-10-28 – 2020-10-29 (×2): 5 mg via ORAL
  Filled 2020-10-28 (×2): qty 1

## 2020-10-28 NOTE — Progress Notes (Addendum)
PROGRESS NOTE    Yvette [name]  TGG:[id number] DOB: 30-Dec-1980 DOA: 10/26/2020 PCP: [facility]  Brief Narrative: 40 year old female with history of alcohol abuse presented to the ED with persistent nausea and vomiting, in the ED she was obtunded and poorly responsive, after getting Ativan for tremors and suspected withdrawal. -Upon evaluation in the ED she was noted to have anion gap metabolic acidosis with osmolar gap hence Ethyl alcohol ingestion was suspected based on prior hospitalization with this in 2019.  Nephrology was consulted, she had a HD catheter placed and underwent 2 hours of hemodialysis overnight.   Assessment & Plan:   Anion gap metabolic acidosis Osmolar gap Alcoholic ketoacidosis -Suspect this is secondary to alcohol intoxication, AKI, starvation etc. -Patient has been awake and alert since yesterday and adamantly denies using ethyl alcohol, she admits to drinking 2 shots of tequila and a few beers daily, ethylene glycol was not detected in serum -Status post urgent HD night of admission on 4/17 -Acidosis and electrolytes normalized -Discontinue dialysis catheter  Recurrent nausea and vomiting -Suspect alcoholic gastritis -Continue PPI, GI following, plan for endoscopy tomorrow  Anemia thrombocytopenia -Likely secondary to alcoholism, also check anemia panel  Alcohol abuse, mild alcohol withdrawal Alcoholic gastritis History of alcoholic liver disease -Previous endoscopies noted hiatal hernia, 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 -Add low-dose Valium x1 day, Ativan per CIWA protocol -Supportive care, alcohol cessation counseled  Leukocytosis -Reactive, resolved  DVT prophylaxis: SCDs Code Status: Full code Family Communication: No family at bedside Disposition Plan:  Status is: Inpatient  Remains inpatient appropriate because:Inpatient level of care appropriate due to  severity of illness   Dispo: The patient is from: Home              Anticipated d/c is to: Home              Patient currently is not medically stable to d/c.   Difficult to place patient No   Consultants:   Nephrology   Procedures: HD catheter 4/17 and hemodialysis  Antimicrobials:    Subjective: -Multiple episodes of vomiting yesterday and this morning  Objective: Vitals:   10/27/20 1936 10/27/20 2328 10/28/20 0322 10/28/20 0809  BP: (!) 128/92 (!) 148/96 (!) 150/90 (!) 134/99  Pulse: 87 80 87 86  Resp: 14 16 16 14   Temp: 99 F (37.2 C) 99.6 F (37.6 C) 99.3 F (37.4 C) 98.9 F (37.2 C)  TempSrc:  Oral Oral Oral  SpO2: 99% 100% 100% 99%  Weight:        Intake/Output Summary (Last 24 hours) at 10/28/2020 1216 Last data filed at 10/28/2020 0500 Gross per 24 hour  Intake 340 ml  Output 900 ml  Net -560 ml   Filed Weights   10/26/20 1102 10/26/20 1704  Weight: 45 kg 49.1 kg    Examination:  General exam: Chronically ill thinly built female sitting up in bed, awake alert oriented x3, no distress HEENT: Right IJ HD catheter noted CVS: S1-S2, regular rate rhythm Lungs: Poor air movement bilaterally Abdomen: Soft, mild epigastric tenderness, bowel sounds present, nondistended Extremities: No edema Neuro: Moves all extremities, no localizing signs   Data Reviewed:   CBC: Recent Labs  Lab 10/26/20 0938 10/26/20 1228 10/26/20 1752 10/27/20 0438 10/27/20 0842 10/28/20 0625  WBC 16.5*  --   --   --  7.2 5.2  HGB 14.5 13.9 11.8* 11.5* 11.3* 11.3*  HCT 46.1* 42.4 35.0* 34.2* 33.7* 33.4*  MCV 89.5  --   --   --  85.1 84.3  PLT 202  --   --   --  116* 112*   Basic Metabolic Panel: Recent Labs  Lab 10/26/20 0938 10/26/20 1234 10/27/20 0438 10/27/20 0842 10/28/20 0625  NA 137  --  137 135 140  K 4.9  --  3.9 4.4 3.2*  CL 98  --  98 96* 98  CO2 12*  --  25 27 26   GLUCOSE 169*  --  78 93 85  BUN 17  --  12 13 12   CREATININE 1.39*  --  0.60 0.69  0.58  CALCIUM 9.6  --  9.1 9.3 9.5  MG  --  1.7  --   --   --   PHOS  --  2.2*  --   --   --    GFR: CrCl cannot be calculated (Unknown ideal weight.). Liver Function Tests: Recent Labs  Lab 10/26/20 0938 10/27/20 0842 10/28/20 0625  AST 283* 105* 78*  ALT 131* 72* 58*  ALKPHOS 118 75 81  BILITOT 1.6* 1.2 1.1  PROT 10.0* 7.5 7.4  ALBUMIN 5.3* 4.0 4.0   Recent Labs  Lab 10/26/20 0938  LIPASE 21   No results for input(s): AMMONIA in the last 168 hours. Coagulation Profile: Recent Labs  Lab 10/26/20 1031  INR 1.2   Cardiac Enzymes: No results for input(s): CKTOTAL, CKMB, CKMBINDEX, TROPONINI in the last 168 hours. BNP (last 3 results) No results for input(s): PROBNP in the last 8760 hours. HbA1C: No results for input(s): HGBA1C in the last 72 hours. CBG: Recent Labs  Lab 10/26/20 1708  GLUCAP 137*   Lipid Profile: No results for input(s): CHOL, HDL, LDLCALC, TRIG, CHOLHDL, LDLDIRECT in the last 72 hours. Thyroid Function Tests: No results for input(s): TSH, T4TOTAL, FREET4, T3FREE, THYROIDAB in the last 72 hours. Anemia Panel: No results for input(s): VITAMINB12, FOLATE, FERRITIN, TIBC, IRON, RETICCTPCT in the last 72 hours. Urine analysis:    Component Value Date/Time   COLORURINE YELLOW (A) 11/09/2019 0240   APPEARANCEUR CLEAR (A) 11/09/2019 0240   LABSPEC 1.020 11/09/2019 0240   PHURINE 6.0 11/09/2019 0240   GLUCOSEU NEGATIVE 11/09/2019 0240   HGBUR SMALL (A) 11/09/2019 0240   BILIRUBINUR NEGATIVE 11/09/2019 0240   KETONESUR 80 (A) 11/09/2019 0240   PROTEINUR 100 (A) 11/09/2019 0240   NITRITE NEGATIVE 11/09/2019 0240   LEUKOCYTESUR NEGATIVE 11/09/2019 0240   Sepsis Labs: @LABRCNTIP (procalcitonin:4,lacticidven:4)  ) Recent Results (from the past 240 hour(s))  Resp Panel by RT-PCR (Flu A&B, Covid) Nasopharyngeal Swab     Status: None   Collection Time: 10/26/20 10:31 AM   Specimen: Nasopharyngeal Swab; Nasopharyngeal(NP) swabs in vial transport  medium  Result Value Ref Range Status   SARS Coronavirus 2 by RT PCR NEGATIVE NEGATIVE Final    Comment: (NOTE) SARS-CoV-2 target nucleic acids are NOT DETECTED.  The SARS-CoV-2 RNA is generally detectable in upper respiratory specimens during the acute phase of infection. The lowest concentration of SARS-CoV-2 viral copies this assay can detect is 138 copies/mL. A negative result does not preclude SARS-Cov-2 infection and should not be used as the sole basis for treatment or other patient management decisions. A negative result may occur with  improper specimen collection/handling, submission of specimen other than nasopharyngeal swab, presence of viral mutation(s) within the areas targeted by this assay, and inadequate number of viral copies(<138 copies/mL). A negative result must be combined with clinical observations, patient history, and  epidemiological information. The expected result is Negative.  Fact Sheet for Patients:  BloggerCourse.com  Fact Sheet for Healthcare Providers:  SeriousBroker.it  This test is no t yet approved or cleared by the [place] FDA and  has been authorized for detection and/or diagnosis of SARS-CoV-2 by FDA under an Emergency Use Authorization (EUA). This EUA will remain  in effect (meaning this test can be used) for the duration of the COVID-19 declaration under Section 564(b)(1) of the Act, 21 U.S.C.section 360bbb-3(b)(1), unless the authorization is terminated  or revoked sooner.       Influenza A by PCR NEGATIVE NEGATIVE Final   Influenza B by PCR NEGATIVE NEGATIVE Final    Comment: (NOTE) The Xpert Xpress SARS-CoV-2/FLU/RSV plus assay is intended as an aid in the diagnosis of influenza from Nasopharyngeal swab specimens and should not be used as a sole basis for treatment. Nasal washings and aspirates are unacceptable for Xpert Xpress SARS-CoV-2/FLU/RSV testing.  Fact Sheet for  Patients: BloggerCourse.com  Fact Sheet for Healthcare Providers: SeriousBroker.it  This test is not yet approved or cleared by the [place] FDA and has been authorized for detection and/or diagnosis of SARS-CoV-2 by FDA under an Emergency Use Authorization (EUA). This EUA will remain in effect (meaning this test can be used) for the duration of the COVID-19 declaration under Section 564(b)(1) of the Act, 21 U.S.C. section 360bbb-3(b)(1), unless the authorization is terminated or revoked.  Performed at [facility], [address] [place], [place] [address]   MRSA PCR Screening     Status: None   Collection Time: 10/26/20  5:07 PM   Specimen: Nasopharyngeal  Result Value Ref Range Status   MRSA by PCR NEGATIVE NEGATIVE Final    Comment:        The GeneXpert MRSA Assay (FDA approved for NASAL specimens only), is one component of a comprehensive MRSA colonization surveillance program. It is not intended to diagnose MRSA infection nor to guide or monitor treatment for MRSA infections. Performed at [facility], [address] [place], [place] [address]      Radiology Studies: DG Chest 1 View  Result Date: 10/26/2020 CLINICAL DATA:  Central line placement. EXAM: CHEST  1 VIEW COMPARISON:  10/26/2020 FINDINGS: RIGHT IJ central line has been placed, tip overlying the level of the LOWER superior vena cava. The heart size is normal. Stable coarse appearance of interstitial markings. There is no pneumothorax. IMPRESSION: Interval placement of RIGHT IJ central line. Electronically Signed   By: Norva Pavlov M.D.   On: 10/26/2020 16:08   DG Chest 1 View  Result Date: 10/26/2020 CLINICAL DATA:  Nausea and vomiting.  Chest pain. EXAM: CHEST  1 VIEW COMPARISON:  None. FINDINGS: Stable cardiomediastinal contours. Chronic coarse bilateral interstitial markings. No new focal consolidation. No pneumothorax or  pleural effusion. No acute finding in the visualized skeleton. IMPRESSION: No acute cardiopulmonary process.  Chronic bronchitic changes. Electronically Signed   By: Emmaline Kluver M.D.   On: 10/26/2020 13:30        Scheduled Meds: . Chlorhexidine Gluconate Cloth  6 each Topical Q0600  . Chlorhexidine Gluconate Cloth  6 each Topical Q0600  . diazepam  5 mg Oral BID  . LORazepam  0-4 mg Intravenous Q12H  . pantoprazole  40 mg Oral BID   Continuous Infusions: . sodium chloride 75 mL/hr at 10/28/20 1002  . promethazine (PHENERGAN) injection (IM or IVPB) 12.5 mg (10/28/20 0617)     LOS: 2 days  Time spent:  Zannie Cove, MD [facility]  10/28/2020, 12:16 PM

## 2020-10-28 NOTE — Progress Notes (Addendum)
Wyline Mood , MD [address], [place], [place], [address] [address], [place], [place], [address] Phone: [phone]  Fax: [phone]   Yvette [name] is being followed for GI bleed   Subjective:  She has been doing well.  Denies any abdominal pain.  No more hematemesis or melena.  Not had a bowel movement since yesterday.  Objective: Vital signs in last 24 hours: Vitals:   10/27/20 1737 10/27/20 1936 10/27/20 2328 10/28/20 0322  BP: (!) 145/88 (!) 128/92 (!) 148/96 (!) 150/90  Pulse: 88 87 80 87  Resp:  14 16 16   Temp:  99 F (37.2 C) 99.6 F (37.6 C) 99.3 F (37.4 C)  TempSrc:   Oral Oral  SpO2: 99% 99% 100% 100%  Weight:       Weight change:   Intake/Output Summary (Last 24 hours) at 10/28/2020 0730 Last data filed at 10/28/2020 0500 Gross per 24 hour  Intake 340 ml  Output 900 ml  Net -560 ml     Exam:  Abdomen: soft, nontender, normal bowel sounds   Lab Results: @LABTEST2 @ Micro Results: Recent Results (from the past 240 hour(s))  Resp Panel by RT-PCR (Flu A&B, Covid) Nasopharyngeal Swab     Status: None   Collection Time: 10/26/20 10:31 AM   Specimen: Nasopharyngeal Swab; Nasopharyngeal(NP) swabs in vial transport medium  Result Value Ref Range Status   SARS Coronavirus 2 by RT PCR NEGATIVE NEGATIVE Final    Comment: (NOTE) SARS-CoV-2 target nucleic acids are NOT DETECTED.  The SARS-CoV-2 RNA is generally detectable in upper respiratory specimens during the acute phase of infection. The lowest concentration of SARS-CoV-2 viral copies this assay can detect is 138 copies/mL. A negative result does not preclude SARS-Cov-2 infection and should not be used as the sole basis for treatment or other patient management decisions. A negative result may occur with  improper specimen collection/handling, submission of specimen other than nasopharyngeal swab, presence of viral mutation(s) within the areas targeted by this assay, and  inadequate number of viral copies(<138 copies/mL). A negative result must be combined with clinical observations, patient history, and epidemiological information. The expected result is Negative.  Fact Sheet for Patients:   Fact Sheet for Healthcare Providers:  10/28/20  This test is no t yet approved or cleared by the [employer].[place] FDA and  has been authorized for detection and/or diagnosis of SARS-CoV-2 by FDA under an Emergency Use Authorization (EUA). This EUA will remain  in effect (meaning this test can be used) for the duration of the COVID-19 declaration under Section 564(b)(1) of the Act, 21 U.S.C.section 360bbb-3(b)(1), unless the authorization is terminated  or revoked sooner.       Influenza A by PCR NEGATIVE NEGATIVE Final   Influenza B by PCR NEGATIVE NEGATIVE Final    Comment: (NOTE) The Xpert Xpress SARS-CoV-2/FLU/RSV plus assay is intended as an aid in the diagnosis of influenza from Nasopharyngeal swab specimens and should not be used as a sole basis for treatment. Nasal washings and aspirates are unacceptable for Xpert Xpress SARS-CoV-2/FLU/RSV testing.  Fact Sheet for Patients: SeriousBroker.it  Fact Sheet for Healthcare Providers: [place]  This test is not yet approved or cleared by the [employer]Course.[place] FDA and has been authorized for detection and/or diagnosis of SARS-CoV-2 by FDA under an Emergency Use Authorization (EUA). This EUA will remain in effect (meaning this test can be used) for the duration of the COVID-19 declaration under Section 564(b)(1) of the  Act, 21 U.S.C. section 360bbb-3(b)(1), unless the authorization is terminated or revoked.  Performed at [facility], [address] [place], [place] [address]   MRSA PCR Screening     Status: None   Collection Time: 10/26/20  5:07 PM   Specimen:  Nasopharyngeal  Result Value Ref Range Status   MRSA by PCR NEGATIVE NEGATIVE Final    Comment:        The GeneXpert MRSA Assay (FDA approved for NASAL specimens only), is one component of a comprehensive MRSA colonization surveillance program. It is not intended to diagnose MRSA infection nor to guide or monitor treatment for MRSA infections. Performed at [facility], [address], [place], [place] [address]    Studies/Results: [place] Chest 1 View  Result Date: 10/26/2020 CLINICAL DATA:  Central line placement. EXAM: CHEST  1 VIEW COMPARISON:  10/26/2020 FINDINGS: RIGHT IJ central line has been placed, tip overlying the level of the LOWER superior vena cava. The heart size is normal. Stable coarse appearance of interstitial markings. There is no pneumothorax. IMPRESSION: Interval placement of RIGHT IJ central line. Electronically Signed   By: Norva Pavlov M.D.   On: 10/26/2020 16:08   DG Chest 1 View  Result Date: 10/26/2020 CLINICAL DATA:  Nausea and vomiting.  Chest pain. EXAM: CHEST  1 VIEW COMPARISON:  None. FINDINGS: Stable cardiomediastinal contours. Chronic coarse bilateral interstitial markings. No new focal consolidation. No pneumothorax or pleural effusion. No acute finding in the visualized skeleton. IMPRESSION: No acute cardiopulmonary process.  Chronic bronchitic changes. Electronically Signed   By: Emmaline Kluver M.D.   On: 10/26/2020 13:30   Medications: I have reviewed the patient's current medications. Scheduled Meds: . Chlorhexidine Gluconate Cloth  6 each Topical Q0600  . Chlorhexidine Gluconate Cloth  6 each Topical Q0600  . LORazepam  0-4 mg Intravenous Q6H   Followed by  . LORazepam  0-4 mg Intravenous Q12H  . pantoprazole  40 mg Oral BID  . potassium chloride  40 mEq Oral Once   Continuous Infusions: . promethazine (PHENERGAN) injection (IM or IVPB) 12.5 mg (10/28/20 0617)   PRN Meds:.alum & mag hydroxide-simeth, ondansetron **OR**  ondansetron (ZOFRAN) IV, ondansetron (ZOFRAN) IV, promethazine (PHENERGAN) injection (IM or IVPB)   Assessment: Principal Problem:   Alcohol withdrawal (HCC) Active Problems:   Alcoholic gastritis   Portal hypertension (HCC)   Alcoholic liver disease, unspecified (HCC)   Alcoholic ketoacidosis   AKI (acute kidney injury) (HCC)   High serum osmolar gap   Yvette [name] 40 y.o. female with a history of daily alcohol use for 3 to 4 years presented with coffee-ground emesis at home.  No melena.  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  There has been a concern for Ethyl alcohol ingestion and the patient has undergone hemodialysis.  Hemoglobin been stable overnight at 11.5 g.  Patient had elevated transaminases on admission.  INR 1.2.  Lactic acidosis was noted.  Acetaminophen level negative  Plan: 1.  LFT's improving likely due to alcohol effects  2. Advise to quit alcohol.  3.  She is happy to proceed with upper endoscopy to evaluate for hematemesis.  I will schedule it for tomorrow.  I have discussed alternative options, risks & benefits,  which include, but are not limited to, bleeding, infection, perforation,respiratory complication & drug reaction.  The patient agrees with this plan & written consent will be obtained.        LOS: 2 days  Wyline Mood, MD 10/28/2020, 7:30 AM

## 2020-10-28 NOTE — Progress Notes (Signed)
Central line discontinue order placed for HD Cath removal.  Contacted RN to verify removal.  RN checked off in CL removal, but uncertain of the [employer].  Policy provided.  Went to assist RN with removal, but RN unavailable at that time and would pull line later with assistance or place IVT consult.

## 2020-10-29 ENCOUNTER — Inpatient Hospital Stay: Payer: Medicaid Other | Admitting: Anesthesiology

## 2020-10-29 ENCOUNTER — Encounter
Admission: EM | Disposition: A | Discharge status: Home / Self Care | Payer: Self-pay | Source: Home / Self Care | Attending: Internal Medicine

## 2020-10-29 DIAGNOSIS — D61818 Other pancytopenia: Secondary | ICD-10-CM

## 2020-10-29 DIAGNOSIS — K2210 Ulcer of esophagus without bleeding: Secondary | ICD-10-CM

## 2020-10-29 HISTORY — PX: ESOPHAGOGASTRODUODENOSCOPY: SHX5428

## 2020-10-29 LAB — CBC
HCT: 30.2 % — ABNORMAL LOW (ref 36.0–46.0)
Hemoglobin: 10 g/dL — ABNORMAL LOW (ref 12.0–15.0)
MCH: 28.3 pg (ref 26.0–34.0)
MCHC: 33.1 g/dL (ref 30.0–36.0)
MCV: 85.6 fL (ref 80.0–100.0)
Platelets: 103 10*3/uL — ABNORMAL LOW (ref 150–400)
RBC: 3.53 MIL/uL — ABNORMAL LOW (ref 3.87–5.11)
RDW: 15.9 % — ABNORMAL HIGH (ref 11.5–15.5)
WBC: 3.1 10*3/uL — ABNORMAL LOW (ref 4.0–10.5)
nRBC: 0 % (ref 0.0–0.2)

## 2020-10-29 LAB — BASIC METABOLIC PANEL
Anion gap: 13 (ref 5–15)
BUN: 10 mg/dL (ref 6–20)
CO2: 25 mmol/L (ref 22–32)
Calcium: 8.9 mg/dL (ref 8.9–10.3)
Chloride: 104 mmol/L (ref 98–111)
Creatinine, Ser: 0.57 mg/dL (ref 0.44–1.00)
GFR, Estimated: >60 mL/min (ref >60)
Glucose, Bld: 87 mg/dL (ref 70–99)
Potassium: 3.6 mmol/L (ref 3.5–5.1)
Sodium: 142 mmol/L (ref 135–145)

## 2020-10-29 SURGERY — EGD (ESOPHAGOGASTRODUODENOSCOPY)
Anesthesia: General

## 2020-10-29 MED ORDER — ONDANSETRON HCL 4 MG/2ML IJ SOLN
INTRAMUSCULAR | Status: AC
  Administered 2020-10-29: 4 mg
  Filled 2020-10-29: qty 2

## 2020-10-29 MED ORDER — PROPOFOL 500 MG/50ML IV EMUL
INTRAVENOUS | Status: AC
  Filled 2020-10-29: qty 50

## 2020-10-29 MED ORDER — PROPOFOL 10 MG/ML IV BOLUS
INTRAVENOUS | Status: DC | PRN
  Administered 2020-10-29: 50 mg via INTRAVENOUS
  Administered 2020-10-29: 80 mg via INTRAVENOUS

## 2020-10-29 MED ORDER — ONDANSETRON HCL 4 MG/2ML IJ SOLN: 4.0000 mg | Freq: Once | INTRAMUSCULAR | Status: AC

## 2020-10-29 NOTE — Anesthesia Preprocedure Evaluation (Signed)
Anesthesia Evaluation  Patient identified by MRN, date of birth, ID band Patient awake    Reviewed: Allergy & Precautions, NPO status , Patient's Chart, lab work & pertinent test results  History of Anesthesia Complications Negative for: history of anesthetic complications  Airway Mallampati: II       Dental   Pulmonary neg sleep apnea, Current Smoker,           Cardiovascular      Neuro/Psych neg Seizures    GI/Hepatic hiatal hernia, GERD  Medicated and Controlled,  Endo/Other  neg diabetes  Renal/GU Renal InsufficiencyRenal disease     Musculoskeletal   Abdominal   Peds  Hematology   Anesthesia Other Findings   Reproductive/Obstetrics                             Anesthesia Physical Anesthesia Plan  ASA: III  Anesthesia Plan: General   Post-op Pain Management:    Induction:   PONV Risk Score and Plan: 2  Airway Management Planned: Nasal Cannula  Additional Equipment:   Intra-op Plan:   Post-operative Plan:   Informed Consent: I have reviewed the patients History and Physical, chart, labs and discussed the procedure including the risks, benefits and alternatives for the proposed anesthesia with the patient or authorized representative who has indicated his/her understanding and acceptance.       Plan Discussed with:   Anesthesia Plan Comments:         Anesthesia Quick Evaluation

## 2020-10-29 NOTE — Progress Notes (Addendum)
PROGRESS NOTE    Yvette [name]  NKN:[id number] DOB: 08/30/1980 DOA: 10/26/2020 PCP: [facility]    Assessment & Plan:   Principal Problem:   Alcohol withdrawal (HCC) Active Problems:   Alcoholic gastritis   Portal hypertension (HCC)   Alcoholic liver disease, unspecified (HCC)   Alcoholic ketoacidosis   AKI (acute kidney injury) (HCC)   High serum osmolar gap   Alcohol intoxication & alcoholic ketoacidosis: drinks 2 shots of tequila & a few beers daily. S/p urgent HD on 10/26/20 as there was concern for ethyl alcohol ingestion. Continue on CIWA protocol. Alcohol cessation counseling  Pancytopenia: likely secondary to alcohol abuse. No need for a transfusion currently   Hematemesis: likely secondary to alcohol abuse. S/p EGD 10/29/20 which shows benign appearing esophageal stenosis, esophageal ulcer w/o bleeding, grade C reflux esophagitis w/o bleeding, hiatal hernia. Continue on PPI BID. Will need repeat EGD in 6 weeks to dilate the stricture of the esophagus & confirm healing of the ulcer as per GI   Transaminitis: likely secondary to alcohol abuse  DVT prophylaxis: SCDs Code Status: full  Family Communication:  Disposition Plan: likely d/c back home   Level of care: Med-Surg   Status is: Inpatient  Remains inpatient appropriate because:Ongoing diagnostic testing needed not appropriate for outpatient work up and Inpatient level of care appropriate due to severity of illness   Dispo: The patient is from: Home              Anticipated d/c is to: Home              Patient currently is not medically stable to d/c.   Difficult to place patient No    Consultants:   GI   Procedures:    Antimicrobials:    Subjective: Pt c/o nausea  Objective: Vitals:   10/28/20 0809 10/28/20 1547 10/28/20 2007 10/29/20 0420  BP: (!) 134/99 117/87 131/81 112/82  Pulse: 86 85 63 60  Resp: 14 18 18 18   Temp: 98.9 F (37.2 C) 99 F (37.2 C) 99.1 F  (37.3 C) 99 F (37.2 C)  TempSrc: Oral     SpO2: 99% 99% 100% 100%  Weight:        Intake/Output Summary (Last 24 hours) at 10/29/2020 0800 Last data filed at 10/29/2020 0300 Gross per 24 hour  Intake 1134.8 ml  Output 400 ml  Net 734.8 ml   Filed Weights   10/26/20 1102 10/26/20 1704  Weight: 45 kg 49.1 kg    Examination:  General exam: Appears calm and comfortable  Respiratory system: Clear to auscultation. Respiratory effort normal. Cardiovascular system: S1 & S2 +. No rubs, gallops or clicks. No pedal edema. Gastrointestinal system: Abdomen is nondistended, soft and nontender.  Normal bowel sounds heard. Central nervous system: Alert and oriented. Moves all 4 extremities  Psychiatry: Judgement and insight appear normal. Flat mood and affect    Data Reviewed: I have personally reviewed following labs and imaging studies  CBC: Recent Labs  Lab 10/26/20 0938 10/26/20 1228 10/26/20 1752 10/27/20 0438 10/27/20 0842 10/28/20 0625 10/29/20 0524  WBC 16.5*  --   --   --  7.2 5.2 3.1*  HGB 14.5   < > 11.8* 11.5* 11.3* 11.3* 10.0*  HCT 46.1*   < > 35.0* 34.2* 33.7* 33.4* 30.2*  MCV 89.5  --   --   --  85.1 84.3 85.6  PLT 202  --   --   --  116* 112* 103*   < > =  values in this interval not displayed.   Basic Metabolic Panel: Recent Labs  Lab 10/26/20 0938 10/26/20 1234 10/27/20 0438 10/27/20 0842 10/28/20 0625 10/29/20 0524  NA 137  --  137 135 140 142  K 4.9  --  3.9 4.4 3.2* 3.6  CL 98  --  98 96* 98 104  CO2 12*  --  25 27 26 25   GLUCOSE 169*  --  78 93 85 87  BUN 17  --  12 13 12 10   CREATININE 1.39*  --  0.60 0.69 0.58 0.57  CALCIUM 9.6  --  9.1 9.3 9.5 8.9  MG  --  1.7  --   --   --   --   PHOS  --  2.2*  --   --   --   --    GFR: CrCl cannot be calculated (Unknown ideal weight.). Liver Function Tests: Recent Labs  Lab 10/26/20 0938 10/27/20 0842 10/28/20 0625  AST 283* 105* 78*  ALT 131* 72* 58*  ALKPHOS 118 75 81  BILITOT 1.6* 1.2 1.1   PROT 10.0* 7.5 7.4  ALBUMIN 5.3* 4.0 4.0   Recent Labs  Lab 10/26/20 0938  LIPASE 21   No results for input(s): AMMONIA in the last 168 hours. Coagulation Profile: Recent Labs  Lab 10/26/20 1031  INR 1.2   Cardiac Enzymes: No results for input(s): CKTOTAL, CKMB, CKMBINDEX, TROPONINI in the last 168 hours. BNP (last 3 results) No results for input(s): PROBNP in the last 8760 hours. HbA1C: No results for input(s): HGBA1C in the last 72 hours. CBG: Recent Labs  Lab 10/26/20 1708  GLUCAP 137*   Lipid Profile: No results for input(s): CHOL, HDL, LDLCALC, TRIG, CHOLHDL, LDLDIRECT in the last 72 hours. Thyroid Function Tests: No results for input(s): TSH, T4TOTAL, FREET4, T3FREE, THYROIDAB in the last 72 hours. Anemia Panel: No results for input(s): VITAMINB12, FOLATE, FERRITIN, TIBC, IRON, RETICCTPCT in the last 72 hours. Sepsis Labs: Recent Labs  Lab 10/26/20 1031 10/26/20 1126  LATICACIDVEN 6.5* 3.1*    Recent Results (from the past 240 hour(s))  Resp Panel by RT-PCR (Flu A&B, Covid) Nasopharyngeal Swab     Status: None   Collection Time: 10/26/20 10:31 AM   Specimen: Nasopharyngeal Swab; Nasopharyngeal(NP) swabs in vial transport medium  Result Value Ref Range Status   SARS Coronavirus 2 by RT PCR NEGATIVE NEGATIVE Final    Comment: (NOTE) SARS-CoV-2 target nucleic acids are NOT DETECTED.  The SARS-CoV-2 RNA is generally detectable in upper respiratory specimens during the acute phase of infection. The lowest concentration of SARS-CoV-2 viral copies this assay can detect is 138 copies/mL. A negative result does not preclude SARS-Cov-2 infection and should not be used as the sole basis for treatment or other patient management decisions. A negative result may occur with  improper specimen collection/handling, submission of specimen other than nasopharyngeal swab, presence of viral mutation(s) within the areas targeted by this assay, and inadequate number of  viral copies(<138 copies/mL). A negative result must be combined with clinical observations, patient history, and epidemiological information. The expected result is Negative.  Fact Sheet for Patients:  10/28/20  Fact Sheet for Healthcare Providers:  10/28/20  This test is no t yet approved or cleared by the [employer] FDA and  has been authorized for detection and/or diagnosis of SARS-CoV-2 by FDA under an Emergency Use Authorization (EUA). This EUA will remain  in effect (meaning this test can be used) for the duration of the  COVID-19 declaration under Section 564(b)(1) of the Act, 21 U.S.C.section 360bbb-3(b)(1), unless the authorization is terminated  or revoked sooner.       Influenza A by PCR NEGATIVE NEGATIVE Final   Influenza B by PCR NEGATIVE NEGATIVE Final    Comment: (NOTE) The Xpert Xpress SARS-CoV-2/FLU/RSV plus assay is intended as an aid in the diagnosis of influenza from Nasopharyngeal swab specimens and should not be used as a sole basis for treatment. Nasal washings and aspirates are unacceptable for Xpert Xpress SARS-CoV-2/FLU/RSV testing.  Fact Sheet for Patients: [employer]com  Fact Sheet for Healthcare Providers: [employer]ious[employer]ker[employer]it  This test is not yet approved or cleared by the [place] FDA and has been authorized for detection and/or diagnosis of SARS-CoV-2 by FDA under an Emergency Use Authorization (EUA). This EUA will remain in effect (meaning this test can be used) for the duration of the COVID-19 declaration under Section 564(b)(1) of the Act, 21 U.S.C. section 360bbb-3(b)(1), unless the authorization is terminated or revoked.  Performed at [facility], [address] [place], [place] [address]   MRSA PCR Screening     Status: None   Collection Time: 10/26/20  5:07 PM   Specimen: Nasopharyngeal  Result  Value Ref Range Status   MRSA by PCR NEGATIVE NEGATIVE Final    Comment:        The GeneXpert MRSA Assay (FDA approved for NASAL specimens only), is one component of a comprehensive MRSA colonization surveillance program. It is not intended to diagnose MRSA infection nor to guide or monitor treatment for MRSA infections. Performed at [facility], [address], [place], [place] [address]          Radiology Studies: No results found.      Scheduled Meds: . Chlorhexidine Gluconate Cloth  6 each Topical Q0600  . Chlorhexidine Gluconate Cloth  6 each Topical Q0600  . diazepam  5 mg Oral BID  . LORazepam  0-4 mg Intravenous Q12H  . pantoprazole  40 mg Oral BID   Continuous Infusions: . sodium chloride    . sodium chloride 75 mL/hr at 10/29/20 0237  . promethazine (PHENERGAN) injection (IM or IVPB) 12.5 mg (10/28/20 2147)     LOS: 3 days    Time spent: 35 mins     Charise Killian, MD [facility] Pager [phone]  If 7PM-7AM, please contact night-coverage 10/29/2020, 8:00 AM

## 2020-10-29 NOTE — Transfer of Care (Signed)
Immediate Anesthesia Transfer of Care Note  Patient: Yvette [name]  Procedure(s) Performed: ESOPHAGOGASTRODUODENOSCOPY (EGD) (N/A )  Patient Location: PACU and Endoscopy Unit  Anesthesia Type:General  Level of Consciousness: drowsy and patient cooperative  Airway & Oxygen Therapy: Patient Spontanous Breathing  Post-op Assessment: Report given to RN and Post -op Vital signs reviewed and stable  Post vital signs: Reviewed and stable  Last Vitals:  Vitals Value Taken Time  BP 127/73 10/29/20 1226  Temp    Pulse 97 10/29/20 1226  Resp 17 10/29/20 1226  SpO2 96 % 10/29/20 1226  Vitals shown include unvalidated device data.  Last Pain:  Vitals:   10/29/20 1119  TempSrc: Temporal  PainSc: 0-No pain         Complications: No complications documented.

## 2020-10-29 NOTE — TOC Progression Note (Signed)
Transition of Care [facility]) - Progression Note    Patient Details  Name: Yvette [name] MRN: [id number] Date of Birth: 09-17-1980  Transition of Care [facility]) CM/SW [place], RN Phone Number: 10/29/2020, 3:31 PM  Clinical Narrative:   Met with the patient in the room and talked with her about ETOH and resources available to help quit, she stated she probably will not quit but plans to lesson the amount she drinks, I left her with the resource list, she was appreciative        Expected Discharge Plan and Services                                                 Social Determinants of Health (SDOH) Interventions    Readmission Risk Interventions No flowsheet data found.

## 2020-10-29 NOTE — Plan of Care (Signed)

## 2020-10-29 NOTE — Op Note (Signed)
[facility] Patient Name: Yvette [name] Procedure Date: 10/29/2020 12:00 PM MRN: [id number] Account #: [id number] Date of Birth: [birth year] Admit Type: Inpatient Age: 40 Room: [facility] ENDO ROOM 3 Gender: Female Note Status: Finalized Procedure:             Upper GI endoscopy Indications:           Hematemesis Providers:             Wyline Mood MD, MD Referring MD:          [facility] Medicines:             Monitored Anesthesia Care Complications:         No immediate complications. Procedure:             Pre-Anesthesia Assessment:                        - Prior to the procedure, a History and Physical was                         performed, and patient medications, allergies and                         sensitivities were reviewed. The patient's tolerance                         of previous anesthesia was reviewed.                        - The risks and benefits of the procedure and the                         sedation options and risks were discussed with the                         patient. All questions were answered and informed                         consent was obtained.                        - ASA Grade Assessment: III - A patient with severe                         systemic disease.                        After obtaining informed consent, the endoscope was                         passed under direct vision. Throughout the procedure,                         the patient's blood pressure, pulse, and oxygen                         saturations were monitored continuously. The Endoscope                         was introduced through the mouth, and advanced to the  third part of duodenum. The upper GI endoscopy was                         accomplished with ease. The patient tolerated the                         procedure well. Findings:      One benign-appearing, intrinsic severe (stenosis; an endoscope cannot        pass) stenosis was found in the proximal esophagus. This stenosis       measured 8 mm (inner diameter) x 1 cm (in length). The stenosis was       traversed after downsizing scope.      LA Grade C (one or more mucosal breaks continuous between tops of 2 or       more mucosal folds, less than 75% circumference) esophagitis with no       bleeding was found in the lower third of the esophagus.      One superficial esophageal ulcer with no stigmata of recent bleeding was       found at the gastroesophageal junction. The lesion was 10 mm in largest       dimension.      A medium-sized hiatal hernia was present.      The examined duodenum was normal.      The exam was otherwise without abnormality. Impression:            - Benign-appearing esophageal stenosis.                        - LA Grade C reflux esophagitis with no bleeding.                        - Esophageal ulcer with no stigmata of recent bleeding.                        - Medium-sized hiatal hernia.                        - Normal examined duodenum.                        - The examination was otherwise normal.                        - No specimens collected. Recommendation:        - Return patient to hospital ward for ongoing care.                        - Advance diet as tolerated.                        - Commence on prilosec 40 mg BID for severe                         esophagitis, ulcer in esophagus                        Repeat EGD in 6 weeks to dilate stricture of the  esophagus and confirm healing of the ulcer of the                         esophagus . Procedure Code(s):     --- Professional ---                        [id number], Esophagogastroduodenoscopy, flexible,                         transoral; diagnostic, including collection of                         specimen(s) by brushing or washing, when performed                         (separate procedure) Diagnosis Code(s):     --- Professional ---                         K22.2, Esophageal obstruction                        K21.00, Gastro-esophageal reflux disease with                         esophagitis, without bleeding                        K22.10, Ulcer of esophagus without bleeding                        K44.9, Diaphragmatic hernia without obstruction or                         gangrene                        K92.0, Hematemesis CPT copyright 2019 American Medical Association. All rights reserved. The codes documented in this report are preliminary and upon coder review may  be revised to meet current compliance requirements. Wyline Mood, MD Wyline Mood MD, MD 10/29/2020 12:23:36 PM This report has been signed electronically. Number of Addenda: 0 Note Initiated On: 10/29/2020 12:00 PM Estimated Blood Loss:  Estimated blood loss: none.      [facility]

## 2020-10-29 NOTE — H&P (Signed)
Wyline Mood, MD [address], [place], [place], [address] [address], [place], [place], [address] Phone: [phone]  Fax: [phone]  Primary Care Physician:  [facility], [facility]   Pre-Procedure History & Physical: HPI:  Yvette [name] is a 40 y.o. female is here for an endoscopy    Past Medical History:  Diagnosis Date  . Asthma   . 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 History of anemia   . History of blood transfusion 1984  . Leukopenia   . Neutropenia [facility])     Past Surgical History:  Procedure Laterality Date  . ESOPHAGOGASTRODUODENOSCOPY (EGD) WITH PROPOFOL N/A 03/14/2020   Procedure: ESOPHAGOGASTRODUODENOSCOPY (EGD) WITH PROPOFOL;  Surgeon: Pasty Spillers, MD;  [name];  Service: Endoscopy;  [name];    Prior to Admission medications   Medication Sig Start Date End Date Taking? Authorizing Provider  FLUoxetine (PROZAC) 40 MG capsule Take 40 mg by mouth daily.   Yes [provider]  mirtazapine (REMERON) 30 MG tablet Take 30 mg by mouth at bedtime.   Yes [provider]  ibuprofen (ADVIL) 800 MG tablet Take 800 mg by mouth every 6 (six) hours as needed. 05/28/20   [provider]  pantoprazole (PROTONIX) 40 MG tablet Take 1 tablet (40 mg total) by mouth daily. 11/13/19 02/11/20  Darlin Priestly, MD  promethazine (PHENERGAN) 25 MG suppository Place 1 suppository (25 mg total) rectally every 6 (six) hours as needed for nausea. Patient not taking: Reported on 10/28/2020 11/15/19 11/14/20  Chinita Pester, FNP    Allergies as of 10/26/2020  . (No Known Allergies)    Family History  Problem Relation Age of Onset  . Lupus Mother   . Cancer Paternal Aunt        Breast   . Cancer Paternal Aunt        Breast     Social History   Socioeconomic History  . Marital status: Single    Spouse name: Not on file  . Number of children: Not on file  .  Years of education: Not on file  . Highest education level: Not on file  Occupational History  . Not on file  Tobacco Use  . Smoking status: Current Every Day Smoker    Packs/day: 0.20    Years: 15.00    Pack years: 3.00    Types: Cigarettes  . Smokeless tobacco: Never Used  Vaping Use  . Vaping Use: Never used  Substance and Sexual Activity  . Alcohol use: Yes    Alcohol/week: 61.0 standard drinks    Types: 21 Cans of beer, [name]  . Drug use: No  . Sexual activity: Yes    Partners: Male    Birth control/protection: Injection  Other Topics Concern  . Not on file  Social History Narrative  . Not on file   Social Determinants of Health   Financial Resource Strain: Not on file  Food Insecurity: Not on file  Transportation Needs: Not on file  Physical Activity: Not on file  Stress: Not on file  Social Connections: Not on file  Intimate Partner Violence: Not on file    Review of Systems: See HPI, otherwise negative ROS  Physical Exam: BP 131/87   Pulse (!) 56   Temp 97.6 F (36.4 C) (Temporal)   Resp  20   Wt 49.1 kg   SpO2 100%   BMI 19.80 kg/m  General:   Alert,  pleasant and cooperative in NAD Head:  Normocephalic and atraumatic. Neck:  Supple; no masses or thyromegaly. Lungs:  Clear throughout to auscultation, normal respiratory effort.    Heart:  +S1, +S2, Regular rate and rhythm, No edema. Abdomen:  Soft, nontender and nondistended. Normal bowel sounds, without guarding, and without rebound.   Neurologic:  Alert and  oriented x4;  grossly normal neurologically.  Impression/Plan: Yvette [name] is here for an endoscopy  to be performed for  evaluation of hematemesis.     Risks, benefits, limitations, and alternatives regarding endoscopy have been reviewed with the patient.  Questions have been answered.  All parties agreeable.   Wyline Mood, MD  10/29/2020, 12:04 PM

## 2020-10-29 NOTE — Anesthesia Postprocedure Evaluation (Signed)
Anesthesia Post Note  Patient: Yvette [name]  Procedure(s) Performed: ESOPHAGOGASTRODUODENOSCOPY (EGD) (N/A )  Patient location during evaluation: Endoscopy Anesthesia Type: General Level of consciousness: awake and alert Pain management: pain level controlled Vital Signs Assessment: post-procedure vital signs reviewed and stable Respiratory status: spontaneous breathing and respiratory function stable Cardiovascular status: stable Anesthetic complications: no   No complications documented.   Last Vitals:  Vitals:   10/29/20 1313 10/29/20 1336  BP: (!) 158/92 (!) 148/95  Pulse:  (!) 51  Resp:  17  Temp:  37.6 C  SpO2:  100%    Last Pain:  Vitals:   10/29/20 1336  TempSrc: Oral  PainSc:                  Nikiya Starn K

## 2020-10-30 ENCOUNTER — Encounter: Payer: Self-pay | Admitting: Gastroenterology

## 2020-10-30 LAB — COMPREHENSIVE METABOLIC PANEL
ALT: 42 U/L (ref 0–44)
AST: 72 U/L — ABNORMAL HIGH (ref 15–41)
Albumin: 3.3 g/dL — ABNORMAL LOW (ref 3.5–5.0)
Alkaline Phosphatase: 62 U/L (ref 38–126)
Anion gap: 12 (ref 5–15)
BUN: 9 mg/dL (ref 6–20)
CO2: 21 mmol/L — ABNORMAL LOW (ref 22–32)
Calcium: 8.4 mg/dL — ABNORMAL LOW (ref 8.9–10.3)
Chloride: 107 mmol/L (ref 98–111)
Creatinine, Ser: 0.68 mg/dL (ref 0.44–1.00)
GFR, Estimated: >60 mL/min (ref >60)
Glucose, Bld: 80 mg/dL (ref 70–99)
Potassium: 2.8 mmol/L — ABNORMAL LOW (ref 3.5–5.1)
Sodium: 140 mmol/L (ref 135–145)
Total Bilirubin: 1.1 mg/dL (ref 0.3–1.2)
Total Protein: 6 g/dL — ABNORMAL LOW (ref 6.5–8.1)

## 2020-10-30 LAB — CBC
HCT: 28.8 % — ABNORMAL LOW (ref 36.0–46.0)
Hemoglobin: 9.5 g/dL — ABNORMAL LOW (ref 12.0–15.0)
MCH: 28.4 pg (ref 26.0–34.0)
MCHC: 33 g/dL (ref 30.0–36.0)
MCV: 86.2 fL (ref 80.0–100.0)
Platelets: 101 K/uL — ABNORMAL LOW (ref 150–400)
RBC: 3.34 MIL/uL — ABNORMAL LOW (ref 3.87–5.11)
RDW: 15.7 % — ABNORMAL HIGH (ref 11.5–15.5)
WBC: 3.1 K/uL — ABNORMAL LOW (ref 4.0–10.5)
nRBC: 0 % (ref 0.0–0.2)

## 2020-10-30 MED ORDER — POTASSIUM CHLORIDE CRYS ER 20 MEQ PO TBCR
40.0000 meq | EXTENDED_RELEASE_TABLET | Freq: Two times a day (BID) | ORAL | Status: AC
  Administered 2020-10-30 (×2): 40 meq via ORAL
  Filled 2020-10-30 (×2): qty 2

## 2020-10-30 MED ORDER — DIAZEPAM 5 MG PO TABS
5.0000 mg | ORAL_TABLET | Freq: Three times a day (TID) | ORAL | Status: DC | PRN
  Administered 2020-10-30: 5 mg via ORAL
  Filled 2020-10-30: qty 1

## 2020-10-30 MED ORDER — DIAZEPAM 5 MG PO TABS: 5.0000 mg | ORAL_TABLET | Freq: Four times a day (QID) | ORAL | Status: DC | PRN

## 2020-10-30 NOTE — Progress Notes (Signed)
PROGRESS NOTE    Yvette [name]  DPO:[id number] DOB: 08/26/1980 DOA: 10/26/2020 PCP: [facility]    Assessment & Plan:   Principal Problem:   Alcohol withdrawal (HCC) Active Problems:   Alcoholic gastritis   Portal hypertension (HCC)   Alcoholic liver disease, unspecified (HCC)   Alcoholic ketoacidosis   AKI (acute kidney injury) (HCC)   High serum osmolar gap   Alcohol intoxication & alcoholic ketoacidosis: drinks 2 shots of tequila & a few beers daily. S/p urgent HD on 10/26/20 as there was concern for ethyl alcohol ingestion. Continue on CIWA protocol. Alcohol cessation counseling. Still w/ nausea today. Phenergan, zofran prn  Hypokalemia: KCL repleated. Will check Mg tomorrow   Pancytopenia: likely secondary to alcohol abuse. No need for a transfusion currently   Hematemesis: likely secondary to alcohol abuse. S/p EGD 10/29/20 which shows benign appearing esophageal stenosis, esophageal ulcer w/o bleeding, grade C reflux esophagitis w/o bleeding, hiatal hernia. Continue on PPI BID. Will need repeat EGD in 6 weeks to dilate the stricture of the esophagus & confirm healing of the ulcer as per GI   Transaminitis: likely secondary to alcohol abuse. ALT is WNL and AST is still elevated but trending down   DVT prophylaxis: SCDs Code Status: full  Family Communication:  Disposition Plan: likely d/c back home   Level of care: Med-Surg   Status is: Inpatient  Remains inpatient appropriate because:Ongoing diagnostic testing needed not appropriate for outpatient work up and Inpatient level of care appropriate due to severity of illness   Dispo: The patient is from: Home              Anticipated d/c is to: Home              Patient currently is not medically stable to d/c.   Difficult to place patient No    Consultants:   GI   Procedures:    Antimicrobials:    Subjective: Pt still c/o nausea.   Objective: Vitals:   10/29/20 1336 10/29/20  1635 10/29/20 1920 10/30/20 0533  BP: (!) 148/95 (!) 147/89 (!) 156/82 (!) 168/97  Pulse: (!) 51 61 (!) 54 (!) 50  Resp: 17 18 18 17   Temp: 99.7 F (37.6 C) 98.4 F (36.9 C) 98.6 F (37 C) 99.3 F (37.4 C)  TempSrc: Oral Oral Oral   SpO2: 100% 100% 100% 100%  Weight:        Intake/Output Summary (Last 24 hours) at 10/30/2020 0740 Last data filed at 10/30/2020 0439 Gross per 24 hour  Intake 200 ml  Output 2250 ml  Net -2050 ml   Filed Weights   10/26/20 1102 10/26/20 1704  Weight: 45 kg 49.1 kg    Examination:  General exam: Appears uncomfortable. Appears older than stated age   Respiratory system: clear breath sounds b/l  Cardiovascular system: S1/S2+. No rubs or clicks  Gastrointestinal system: Abd is soft, NT, ND & hypoactive bowel sounds  Central nervous system: Alert and oriented. Moves all 4 extremities  Psychiatry: judgement and insight appear normal. Flat mood and affect     Data Reviewed: I have personally reviewed following labs and imaging studies  CBC: Recent Labs  Lab 10/26/20 0938 10/26/20 1228 10/27/20 0438 10/27/20 0842 10/28/20 0625 10/29/20 0524 10/30/20 0613  WBC 16.5*  --   --  7.2 5.2 3.1* 3.1*  HGB 14.5   < > 11.5* 11.3* 11.3* 10.0* 9.5*  HCT 46.1*   < > 34.2* 33.7* 33.4* 30.2*  28.8*  MCV 89.5  --   --  85.1 84.3 85.6 86.2  PLT 202  --   --  116* 112* 103* 101*   < > = values in this interval not displayed.   Basic Metabolic Panel: Recent Labs  Lab 10/26/20 1234 10/27/20 0438 10/27/20 0842 10/28/20 0625 10/29/20 0524 10/30/20 0613  NA  --  137 135 140 142 140  K  --  3.9 4.4 3.2* 3.6 2.8*  CL  --  98 96* 98 104 107  CO2  --  25 27 26 25  21*  GLUCOSE  --  78 93 85 87 80  BUN  --  12 13 12 10 9   CREATININE  --  0.60 0.69 0.58 0.57 0.68  CALCIUM  --  9.1 9.3 9.5 8.9 8.4*  MG 1.7  --   --   --   --   --   PHOS 2.2*  --   --   --   --   --    GFR: CrCl cannot be calculated (Unknown ideal weight.). Liver Function  Tests: Recent Labs  Lab 10/26/20 0938 10/27/20 0842 10/28/20 0625 10/30/20 0613  AST 283* 105* 78* 72*  ALT 131* 72* 58* 42  ALKPHOS 118 75 81 62  BILITOT 1.6* 1.2 1.1 1.1  PROT 10.0* 7.5 7.4 6.0*  ALBUMIN 5.3* 4.0 4.0 3.3*   Recent Labs  Lab 10/26/20 0938  LIPASE 21   No results for input(s): AMMONIA in the last 168 hours. Coagulation Profile: Recent Labs  Lab 10/26/20 1031  INR 1.2   Cardiac Enzymes: No results for input(s): CKTOTAL, CKMB, CKMBINDEX, TROPONINI in the last 168 hours. BNP (last 3 results) No results for input(s): PROBNP in the last 8760 hours. HbA1C: No results for input(s): HGBA1C in the last 72 hours. CBG: Recent Labs  Lab 10/26/20 1708  GLUCAP 137*   Lipid Profile: No results for input(s): CHOL, HDL, LDLCALC, TRIG, CHOLHDL, LDLDIRECT in the last 72 hours. Thyroid Function Tests: No results for input(s): TSH, T4TOTAL, FREET4, T3FREE, THYROIDAB in the last 72 hours. Anemia Panel: No results for input(s): VITAMINB12, FOLATE, FERRITIN, TIBC, IRON, RETICCTPCT in the last 72 hours. Sepsis Labs: Recent Labs  Lab 10/26/20 1031 10/26/20 1126  LATICACIDVEN 6.5* 3.1*    Recent Results (from the past 240 hour(s))  Resp Panel by RT-PCR (Flu A&B, Covid) Nasopharyngeal Swab     Status: None   Collection Time: 10/26/20 10:31 AM   Specimen: Nasopharyngeal Swab; Nasopharyngeal(NP) swabs in vial transport medium  Result Value Ref Range Status   SARS Coronavirus 2 by RT PCR NEGATIVE NEGATIVE Final    Comment: (NOTE) SARS-CoV-2 target nucleic acids are NOT DETECTED.  The SARS-CoV-2 RNA is generally detectable in upper respiratory specimens during the acute phase of infection. The lowest concentration of SARS-CoV-2 viral copies this assay can detect is 138 copies/mL. A negative result does not preclude SARS-Cov-2 infection and should not be used as the sole basis for treatment or other patient management decisions. A negative result may occur with   improper specimen collection/handling, submission of specimen other than nasopharyngeal swab, presence of viral mutation(s) within the areas targeted by this assay, and inadequate number of viral copies(<138 copies/mL). A negative result must be combined with clinical observations, patient history, and epidemiological information. The expected result is Negative.  Fact Sheet for Patients:  10/28/20  Fact Sheet for Healthcare Providers:  10/28/20  This test is no t yet approved or cleared by the  [place] [employer] and  has been authorized for detection and/or diagnosis of SARS-CoV-2 by FDA under an TEFL teacher (EUA). This EUA will remain  in effect (meaning this test can be used) for the duration of the COVID-19 declaration under Section 564(b)(1) of the Act, 21 U.S.C.section 360bbb-3(b)(1), unless the authorization is terminated  or revoked sooner.       Influenza A by PCR NEGATIVE NEGATIVE Final   Influenza B by PCR NEGATIVE NEGATIVE Final    Comment: (NOTE) The Xpert Xpress SARS-CoV-2/FLU/RSV plus assay is intended as an aid in the diagnosis of influenza from Nasopharyngeal swab specimens and should not be used as a sole basis for treatment. Nasal washings and aspirates are unacceptable for Xpert Xpress SARS-CoV-2/FLU/RSV testing.  Fact Sheet for Patients: BloggerCourse.com  Fact Sheet for Healthcare Providers: SeriousBroker.it  This test is not yet approved or cleared by the [place] FDA and has been authorized for detection and/or diagnosis of SARS-CoV-2 by FDA under an Emergency Use Authorization (EUA). This EUA will remain in effect (meaning this test can be used) for the duration of the COVID-19 declaration under Section 564(b)(1) of the Act, 21 U.S.C. section 360bbb-3(b)(1), unless the authorization is terminated  or revoked.  Performed at [facility], [address] [place], [place] [address]   MRSA PCR Screening     Status: None   Collection Time: 10/26/20  5:07 PM   Specimen: Nasopharyngeal  Result Value Ref Range Status   MRSA by PCR NEGATIVE NEGATIVE Final    Comment:        The GeneXpert MRSA Assay (FDA approved for NASAL specimens only), is one component of a comprehensive MRSA colonization surveillance program. It is not intended to diagnose MRSA infection nor to guide or monitor treatment for MRSA infections. Performed at [facility], [address] [place], [place] [address]          Radiology Studies: No results found.      Scheduled Meds: . Chlorhexidine Gluconate Cloth  6 each Topical Q0600  . Chlorhexidine Gluconate Cloth  6 each Topical Q0600  . LORazepam  0-4 mg Intravenous Q12H  . pantoprazole  40 mg Oral BID  . potassium chloride  40 mEq Oral BID   Continuous Infusions: . sodium chloride 75 mL/hr at 10/30/20 0328  . promethazine (PHENERGAN) injection (IM or IVPB) 12.5 mg (10/30/20 0439)     LOS: 4 days    Time spent: 35 mins     Charise Killian, MD [facility] Pager [phone]  If 7PM-7AM, please contact night-coverage 10/30/2020, 7:40 AM

## 2020-10-31 LAB — COMPREHENSIVE METABOLIC PANEL
ALT: 34 U/L (ref 0–44)
AST: 40 U/L (ref 15–41)
Albumin: 3.4 g/dL — ABNORMAL LOW (ref 3.5–5.0)
Alkaline Phosphatase: 65 U/L (ref 38–126)
Anion gap: 8 (ref 5–15)
BUN: 7 mg/dL (ref 6–20)
CO2: 24 mmol/L (ref 22–32)
Calcium: 8.5 mg/dL — ABNORMAL LOW (ref 8.9–10.3)
Chloride: 107 mmol/L (ref 98–111)
Creatinine, Ser: 0.68 mg/dL (ref 0.44–1.00)
GFR, Estimated: >60 mL/min (ref >60)
Glucose, Bld: 102 mg/dL — ABNORMAL HIGH (ref 70–99)
Potassium: 3.1 mmol/L — ABNORMAL LOW (ref 3.5–5.1)
Sodium: 139 mmol/L (ref 135–145)
Total Bilirubin: 0.8 mg/dL (ref 0.3–1.2)
Total Protein: 6.2 g/dL — ABNORMAL LOW (ref 6.5–8.1)

## 2020-10-31 LAB — CBC
HCT: 29.5 % — ABNORMAL LOW (ref 36.0–46.0)
Hemoglobin: 9.8 g/dL — ABNORMAL LOW (ref 12.0–15.0)
MCH: 28.1 pg (ref 26.0–34.0)
MCHC: 33.2 g/dL (ref 30.0–36.0)
MCV: 84.5 fL (ref 80.0–100.0)
Platelets: 111 10*3/uL — ABNORMAL LOW (ref 150–400)
RBC: 3.49 MIL/uL — ABNORMAL LOW (ref 3.87–5.11)
RDW: 15.2 % (ref 11.5–15.5)
WBC: 2.7 10*3/uL — ABNORMAL LOW (ref 4.0–10.5)
nRBC: 0 % (ref 0.0–0.2)

## 2020-10-31 LAB — MAGNESIUM: Magnesium: 1.5 mg/dL — ABNORMAL LOW (ref 1.7–2.4)

## 2020-10-31 MED ORDER — PROMETHAZINE HCL 12.5 MG PO TABS: 12.5000 mg | ORAL_TABLET | Freq: Three times a day (TID) | ORAL | 0 refills | Status: DC | PRN

## 2020-10-31 MED ORDER — POTASSIUM CHLORIDE CRYS ER 20 MEQ PO TBCR
40.0000 meq | EXTENDED_RELEASE_TABLET | Freq: Two times a day (BID) | ORAL | Status: DC
  Administered 2020-10-31: 40 meq via ORAL
  Filled 2020-10-31: qty 2

## 2020-10-31 MED ORDER — HYDRALAZINE HCL 50 MG PO TABS
50.0000 mg | ORAL_TABLET | Freq: Four times a day (QID) | ORAL | Status: DC | PRN
  Filled 2020-10-31: qty 1

## 2020-10-31 MED ORDER — PANTOPRAZOLE SODIUM 40 MG PO TBEC: 40.0000 mg | DELAYED_RELEASE_TABLET | Freq: Two times a day (BID) | ORAL | 0 refills | Status: DC

## 2020-10-31 MED ORDER — THIAMINE HCL 100 MG PO TABS
100.0000 mg | ORAL_TABLET | Freq: Every day | ORAL | Status: DC
  Administered 2020-10-31: 100 mg via ORAL
  Filled 2020-10-31: qty 1

## 2020-10-31 MED ORDER — OXYCODONE-ACETAMINOPHEN 5-325 MG PO TABS: 1.0000 | ORAL_TABLET | Freq: Three times a day (TID) | ORAL | 0 refills | Status: AC | PRN

## 2020-10-31 MED ORDER — ADULT MULTIVITAMIN W/MINERALS CH
1.0000 | ORAL_TABLET | Freq: Every day | ORAL | Status: DC
  Administered 2020-10-31: 1 via ORAL
  Filled 2020-10-31: qty 1

## 2020-10-31 MED ORDER — MAGNESIUM SULFATE 2 GM/50ML IV SOLN
2.0000 g | Freq: Once | INTRAVENOUS | Status: AC
  Administered 2020-10-31: 2 g via INTRAVENOUS
  Filled 2020-10-31: qty 50

## 2020-10-31 MED ORDER — ENSURE ENLIVE PO LIQD
237.0000 mL | Freq: Three times a day (TID) | ORAL | Status: DC
  Administered 2020-10-31: 237 mL via ORAL

## 2020-10-31 MED ORDER — FOLIC ACID 1 MG PO TABS
1.0000 mg | ORAL_TABLET | Freq: Every day | ORAL | Status: DC
  Administered 2020-10-31: 1 mg via ORAL
  Filled 2020-10-31: qty 1

## 2020-10-31 MED ORDER — DIAZEPAM 5 MG PO TABS: 5.0000 mg | ORAL_TABLET | Freq: Three times a day (TID) | ORAL | 0 refills | Status: AC | PRN

## 2020-10-31 NOTE — Discharge Summary (Addendum)
Physician Discharge Summary  Yvette [name] WUJ:[id number] DOB: Jun 28, 1981 DOA: 10/26/2020  [facility]  Admit date: 10/26/2020 Discharge date: 10/31/2020  Admitted From: home  Disposition: home   Recommendations for Outpatient Follow-up:  1. Follow up with PCP in 1-2 weeks 2. F/u w/ GI, Dr. Tobi BastosAnna, in 6 weeks for repeat EGD  Home Health: no  Equipment/Devices:  Discharge Condition: stable  CODE STATUS: full  Diet recommendation: regular   Brief/Interim Summary: HPI was taken from Dr. Joylene IgoAgbata: Yvette [name] is a 40 y.o. female with medical history significant for alcohol dependence who presents to the emergency room via EMS for evaluation of nausea, vomiting and coffee-ground emesis for 2 days prior to her admission.  Patient's last alcohol use was 2 days ago and during my evaluation she has generalized tremors.  She had received a dose of Ativan 2 mg in the ER because of her tremors and tachycardia concerning for alcohol withdrawal. She denies any toxic alcoholic ingestion. I am unable to do a review of systems on this patient due to lethargy Labs show sodium 137, potassium 4.9, chloride 98, bicarb 12, glucose 169, BUN 17, creatinine 1.3 above a baseline of 0.49, calcium 9.6, anion gap 27, alkaline phosphatase 118, albumin 5.3, lipase 21, AST 283, ALT 131, total protein 10.0, lactic acid 6.5, osmolality 313, white count 16.5, hemoglobin 14.5, hematocrit 46.1, MCV 89.5, RDW 16.6, platelet count 202, PT 14.7, INR 1.2 Urine pregnancy test is negative Respiratory viral panel is negative Twelve-lead EKG reviewed by me shows sinus tachycardia.    ED Course: Patient is a 40 year old female who presents to the ER for evaluation of nausea and coffee-ground emesis for about 2 days prior to her admission.  She received lorazepam 2 mg IV in the ER for alcohol withdrawal symptoms and is currently lethargic but arouses easily. She has an anion gap metabolic acidosis,  with elevated lactic acid at 6.5 as well as an osmolar gap. She also has acute kidney injury and there is a concern for possible toxic ingestion even though patient denies. She will be admitted to the hospital for further evaluation.  Hospital course from Dr. Mayford KnifeWilliams 4/20-4/22/22: Pt was found to have benign appearing esophageal stenosis, esophageal ulcer, grade C reflux esophagitis and hiatal hernia on EGD as per GI. Pt did received protonix 40mg  BID and was d/c home with this as well. Pt will f/u w/ GI, Dr. Tobi BastosAnna, in 6 weeks for repeat EGD. Pt verbalized her understanding. Of note, pt did receive alcohol cessation counseling as well as substance abuse resources while inpatient. For more information, please see previous progress/consult notes.   Discharge Diagnoses:  Principal Problem:   Alcohol withdrawal (HCC) Active Problems:   Alcoholic gastritis   Portal hypertension (HCC)   Alcoholic liver disease, unspecified (HCC)   Alcoholic ketoacidosis   AKI (acute kidney injury) (HCC)   High serum osmolar gap  Alcohol intoxication & alcoholic ketoacidosis: drinks 2 shots of tequila & a few beers daily. S/p urgent HD on 10/26/20 as there was concern for ethyl alcohol ingestion. Continue on CIWA protocol. Alcohol cessation counseling. Intermittent nausea. Phenergan, zofran prn  Hypokalemia: KCl given.  Hypomagnesemia: mg sulfate given. Will continue to monitor   Pancytopenia: likely secondary to alcohol abuse. No transfusion needed currently   Hematemesis: likely secondary to alcohol abuse. S/p EGD 10/29/20 which shows benign appearing esophageal stenosis, esophageal ulcer w/o bleeding, grade C reflux esophagitis w/o bleeding, hiatal hernia. Continue on PPI BID. Will need  repeat EGD in 6 weeks to dilate the stricture of the esophagus & confirm healing of the ulcer as per GI   Transaminitis: resolved   Discharge Instructions  Discharge Instructions    Diet general   Complete by: As  directed    Discharge instructions   Complete by: As directed    F/u PCP in 1-2 weeks. F/u w/ GI, Dr. Tobi Bastos, in 6 weeks for repeat EGD   Increase activity slowly   Complete by: As directed    No wound care   Complete by: As directed      Allergies as of 10/31/2020   No Known Allergies     Medication List    STOP taking these medications   ibuprofen 800 MG tablet Commonly known as: ADVIL   promethazine 25 MG suppository Commonly known as: Phenergan Replaced by: promethazine 12.5 MG tablet     TAKE these medications   diazepam 5 MG tablet Commonly known as: VALIUM Take 1 tablet (5 mg total) by mouth every 8 (eight) hours as needed for up to 3 days for anxiety.   FLUoxetine 40 MG capsule Commonly known as: PROZAC Take 40 mg by mouth daily.   mirtazapine 30 MG tablet Commonly known as: REMERON Take 30 mg by mouth at bedtime.   oxyCODONE-acetaminophen 5-325 MG tablet Commonly known as: Percocet Take 1 tablet by mouth every 8 (eight) hours as needed for up to 3 days for moderate pain or severe pain.   pantoprazole 40 MG tablet Commonly known as: PROTONIX Take 1 tablet (40 mg total) by mouth 2 (two) times daily. What changed: when to take this   promethazine 12.5 MG tablet Commonly known as: PHENERGAN Take 1 tablet (12.5 mg total) by mouth every 8 (eight) hours as needed for up to 7 days for nausea or vomiting. Replaces: promethazine 25 MG suppository       No Known Allergies  Consultations:  GI, Dr. Tobi Bastos    Procedures/Studies: DG Chest 1 View  Result Date: 10/26/2020 CLINICAL DATA:  Central line placement. EXAM: CHEST  1 VIEW COMPARISON:  10/26/2020 FINDINGS: RIGHT IJ central line has been placed, tip overlying the level of the LOWER superior vena cava. The heart size is normal. Stable coarse appearance of interstitial markings. There is no pneumothorax. IMPRESSION: Interval placement of RIGHT IJ central line. Electronically Signed   By: Norva Pavlov M.D.    On: 10/26/2020 16:08   DG Chest 1 View  Result Date: 10/26/2020 CLINICAL DATA:  Nausea and vomiting.  Chest pain. EXAM: CHEST  1 VIEW COMPARISON:  None. FINDINGS: Stable cardiomediastinal contours. Chronic coarse bilateral interstitial markings. No new focal consolidation. No pneumothorax or pleural effusion. No acute finding in the visualized skeleton. IMPRESSION: No acute cardiopulmonary process.  Chronic bronchitic changes. Electronically Signed   By: Emmaline Kluver M.D.   On: 10/26/2020 13:30      Subjective: Pt c/o intermittent nausea    Discharge Exam: Vitals:   10/31/20 0800 10/31/20 1130  BP: (!) 170/90 (!) 156/90  Pulse: (!) 44 (!) 43  Resp: 18 18  Temp: 98.3 F (36.8 C)   SpO2:     Vitals:   10/31/20 0103 10/31/20 0603 10/31/20 0800 10/31/20 1130  BP: (!) 157/95 (!) 157/94 (!) 170/90 (!) 156/90  Pulse: (!) 49 (!) 43 (!) 44 (!) 43  Resp:  17 18 18   Temp:  98.8 F (37.1 C) 98.3 F (36.8 C)   TempSrc:   Oral   SpO2: 100%  100%    Weight:        General: Pt is alert, awake, not in acute distress Cardiovascular: S1/S2 +, no rubs, no gallops Respiratory: CTA bilaterally, no wheezing, no rhonchi Abdominal: Soft, NT, ND, bowel sounds + Extremities: no edema, no cyanosis    The results of significant diagnostics from this hospitalization (including imaging, microbiology, ancillary and laboratory) are listed below for reference.     Microbiology: Recent Results (from the past 240 hour(s))  Resp Panel by RT-PCR (Flu A&B, Covid) Nasopharyngeal Swab     Status: None   Collection Time: 10/26/20 10:31 AM   Specimen: Nasopharyngeal Swab; Nasopharyngeal(NP) swabs in vial transport medium  Result Value Ref Range Status   SARS Coronavirus 2 by RT PCR NEGATIVE NEGATIVE Final    Comment: (NOTE) SARS-CoV-2 target nucleic acids are NOT DETECTED.  The SARS-CoV-2 RNA is generally detectable in upper respiratory specimens during the acute phase of infection. The  lowest concentration of SARS-CoV-2 viral copies this assay can detect is 138 copies/mL. A negative result does not preclude SARS-Cov-2 infection and should not be used as the sole basis for treatment or other patient management decisions. A negative result may occur with  improper specimen collection/handling, submission of specimen other than nasopharyngeal swab, presence of viral mutation(s) within the areas targeted by this assay, and inadequate number of viral copies(<138 copies/mL). A negative result must be combined with clinical observations, patient history, and epidemiological information. The expected result is Negative.  Fact Sheet for Patients:  BloggerCourse.com  Fact Sheet for Healthcare Providers:  SeriousBroker.it  This test is no t yet approved or cleared by the [place] FDA and  has been authorized for detection and/or diagnosis of SARS-CoV-2 by FDA under an Emergency Use Authorization (EUA). This EUA will remain  in effect (meaning this test can be used) for the duration of the COVID-19 declaration under Section 564(b)(1) of the Act, 21 U.S.C.section 360bbb-3(b)(1), unless the authorization is terminated  or revoked sooner.       Influenza A by PCR NEGATIVE NEGATIVE Final   Influenza B by PCR NEGATIVE NEGATIVE Final    Comment: (NOTE) The Xpert Xpress SARS-CoV-2/FLU/RSV plus assay is intended as an aid in the diagnosis of influenza from Nasopharyngeal swab specimens and should not be used as a sole basis for treatment. Nasal washings and aspirates are unacceptable for Xpert Xpress SARS-CoV-2/FLU/RSV testing.  Fact Sheet for Patients: BloggerCourse.com  Fact Sheet for Healthcare Providers: SeriousBroker.it  This test is not yet approved or cleared by the [place] FDA and has been authorized for detection and/or diagnosis of SARS-CoV-2 by FDA under  an Emergency Use Authorization (EUA). This EUA will remain in effect (meaning this test can be used) for the duration of the COVID-19 declaration under Section 564(b)(1) of the Act, 21 U.S.C. section 360bbb-3(b)(1), unless the authorization is terminated or revoked.  Performed at [facility], [address] [place], [place] [address]   MRSA PCR Screening     Status: None   Collection Time: 10/26/20  5:07 PM   Specimen: Nasopharyngeal  Result Value Ref Range Status   MRSA by PCR NEGATIVE NEGATIVE Final    Comment:        The GeneXpert MRSA Assay (FDA approved for NASAL specimens only), is one component of a comprehensive MRSA colonization surveillance program. It is not intended to diagnose MRSA infection nor to guide or monitor treatment for MRSA infections. Performed at [facility], [address] [place],  [place] [address]      Labs: BNP (last 3 results) No results for input(s): BNP in the last 8760 hours. Basic Metabolic Panel: Recent Labs  Lab 10/26/20 1234 10/27/20 0438 10/27/20 0842 10/28/20 0625 10/29/20 0524 10/30/20 0613 10/31/20 0346  NA  --    < > 135 140 142 140 139  K  --    < > 4.4 3.2* 3.6 2.8* 3.1*  CL  --    < > 96* 98 104 107 107  CO2  --    < > 27 26 25  21* 24  GLUCOSE  --    < > 93 85 87 80 102*  BUN  --    < > 13 12 10 9 7   CREATININE  --    < > 0.69 0.58 0.57 0.68 0.68  CALCIUM  --    < > 9.3 9.5 8.9 8.4* 8.5*  MG 1.7  --   --   --   --   --  1.5*  PHOS 2.2*  --   --   --   --   --   --    < > = values in this interval not displayed.   Liver Function Tests: Recent Labs  Lab 10/26/20 0938 10/27/20 0842 10/28/20 0625 10/30/20 0613 10/31/20 0346  AST 283* 105* 78* 72* 40  ALT 131* 72* 58* 42 34  ALKPHOS 118 75 81 62 65  BILITOT 1.6* 1.2 1.1 1.1 0.8  PROT 10.0* 7.5 7.4 6.0* 6.2*  ALBUMIN 5.3* 4.0 4.0 3.3* 3.4*   Recent Labs  Lab 10/26/20 0938  LIPASE 21   No results for input(s): AMMONIA in the last 168  hours. CBC: Recent Labs  Lab 10/27/20 0842 10/28/20 0625 10/29/20 0524 10/30/20 0613 10/31/20 0346  WBC 7.2 5.2 3.1* 3.1* 2.7*  HGB 11.3* 11.3* 10.0* 9.5* 9.8*  HCT 33.7* 33.4* 30.2* 28.8* 29.5*  MCV 85.1 84.3 85.6 86.2 84.5  PLT 116* 112* 103* 101* 111*   Cardiac Enzymes: No results for input(s): CKTOTAL, CKMB, CKMBINDEX, TROPONINI in the last 168 hours. BNP: Invalid input(s): POCBNP CBG: Recent Labs  Lab 10/26/20 1708  GLUCAP 137*   D-Dimer No results for input(s): DDIMER in the last 72 hours. Hgb A1c No results for input(s): HGBA1C in the last 72 hours. Lipid Profile No results for input(s): CHOL, HDL, LDLCALC, TRIG, CHOLHDL, LDLDIRECT in the last 72 hours. Thyroid function studies No results for input(s): TSH, T4TOTAL, T3FREE, THYROIDAB in the last 72 hours.  Invalid input(s): FREET3 Anemia work up No results for input(s): VITAMINB12, FOLATE, FERRITIN, TIBC, IRON, RETICCTPCT in the last 72 hours. Urinalysis    Component Value Date/Time   COLORURINE AMBER (A) 10/28/2020 1330   APPEARANCEUR HAZY (A) 10/28/2020 1330   LABSPEC 1.025 10/28/2020 1330   PHURINE 6.0 10/28/2020 1330   GLUCOSEU NEGATIVE 10/28/2020 1330   HGBUR LARGE (A) 10/28/2020 1330   BILIRUBINUR NEGATIVE 10/28/2020 1330   KETONESUR 80 (A) 10/28/2020 1330   PROTEINUR 100 (A) 10/28/2020 1330   NITRITE NEGATIVE 10/28/2020 1330   LEUKOCYTESUR NEGATIVE 10/28/2020 1330   Sepsis Labs Invalid input(s): PROCALCITONIN,  WBC,  LACTICIDVEN Microbiology Recent Results (from the past 240 hour(s))  Resp Panel by RT-PCR (Flu A&B, Covid) Nasopharyngeal Swab     Status: None   Collection Time: 10/26/20 10:31 AM   Specimen: Nasopharyngeal Swab; Nasopharyngeal(NP) swabs in vial transport medium  Result Value Ref Range Status   SARS Coronavirus 2 by RT PCR NEGATIVE NEGATIVE Final  Comment: (NOTE) SARS-CoV-2 target nucleic acids are NOT DETECTED.  The SARS-CoV-2 RNA is generally detectable in upper  respiratory specimens during the acute phase of infection. The lowest concentration of SARS-CoV-2 viral copies this assay can detect is 138 copies/mL. A negative result does not preclude SARS-Cov-2 infection and should not be used as the sole basis for treatment or other patient management decisions. A negative result may occur with  improper specimen collection/handling, submission of specimen other than nasopharyngeal swab, presence of viral mutation(s) within the areas targeted by this assay, and inadequate number of viral copies(<138 copies/mL). A negative result must be combined with clinical observations, patient history, and epidemiological information. The expected result is Negative.  Fact Sheet for Patients:  BloggerCourse.com  Fact Sheet for Healthcare Providers:  SeriousBroker.it  This test is no t yet approved or cleared by the [place] FDA and  has been authorized for detection and/or diagnosis of SARS-CoV-2 by FDA under an Emergency Use Authorization (EUA). This EUA will remain  in effect (meaning this test can be used) for the duration of the COVID-19 declaration under Section 564(b)(1) of the Act, 21 U.S.C.section 360bbb-3(b)(1), unless the authorization is terminated  or revoked sooner.       Influenza A by PCR NEGATIVE NEGATIVE Final   Influenza B by PCR NEGATIVE NEGATIVE Final    Comment: (NOTE) The Xpert Xpress SARS-CoV-2/FLU/RSV plus assay is intended as an aid in the diagnosis of influenza from Nasopharyngeal swab specimens and should not be used as a sole basis for treatment. Nasal washings and aspirates are unacceptable for Xpert Xpress SARS-CoV-2/FLU/RSV testing.  Fact Sheet for Patients: BloggerCourse.com  Fact Sheet for Healthcare Providers: SeriousBroker.it  This test is not yet approved or cleared by the [place] FDA and has been  authorized for detection and/or diagnosis of SARS-CoV-2 by FDA under an Emergency Use Authorization (EUA). This EUA will remain in effect (meaning this test can be used) for the duration of the COVID-19 declaration under Section 564(b)(1) of the Act, 21 U.S.C. section 360bbb-3(b)(1), unless the authorization is terminated or revoked.  Performed at [facility], [address] [place], [place] [address]   MRSA PCR Screening     Status: None   Collection Time: 10/26/20  5:07 PM   Specimen: Nasopharyngeal  Result Value Ref Range Status   MRSA by PCR NEGATIVE NEGATIVE Final    Comment:        The GeneXpert MRSA Assay (FDA approved for NASAL specimens only), is one component of a comprehensive MRSA colonization surveillance program. It is not intended to diagnose MRSA infection nor to guide or monitor treatment for MRSA infections. Performed at [facility], [address] [place], [place] [address]      Time coordinating discharge: Over 30 minutes  SIGNED:   Charise Killian, MD  [facility] 10/31/2020, 1:13 PM Pager   If 7PM-7AM, please contact night-coverage

## 2020-10-31 NOTE — Plan of Care (Signed)
Pt discharged home per MD orders at this time.All discharged instructions,education and medications reviewed with patient.Pt expressed understanding and will comply with dc instructions.Follow up appointments also communicated to patient.no verbal c/o or any ssx of distress.Patient transported home by family in a private car.

## 2020-10-31 NOTE — Progress Notes (Signed)
Initial Nutrition Assessment  DOCUMENTATION CODES:  Not applicable  INTERVENTION:   Continue current diet as tolerated  Ensure Enlive po TID, each supplement provides 350 kcal and 20 grams of protein  MVI with minerals daily  Thiamine and folic acid daily for Hx of EtOH abuse  NUTRITION DIAGNOSIS:  Inadequate oral intake related to nausea,poor appetite as evidenced by per patient/family report,other (comment) (clear liquid diet x 5 days).  GOAL:  Patient will meet greater than or equal to 90% of their needs  MONITOR:  Supplement acceptance,PO intake  REASON FOR ASSESSMENT:  NPO/Clear Liquid Diet    ASSESSMENT:  Pt presented to ED with nausea and coffee-ground emesis ongoing for the last 2 days. Found to be in EtOH withdrawal and with acute renal failure (thought to be due to ethyl alcohol ingestion). Emergently placed on HD. PMH includes EtOH abuse (5+ servings per day) and cirrhosis.  Pt resting in bed at the time of visit. Was advanced to GI soft diet for lunch, but has not had this meal yet. Reports she continues to have nausea and vomiting. Is trying to slowly sip on fluids, but reports too much at one time is causing vomiting. Nervous about trying solid foods. Discussed lack of protein in clear liquid diet, agreeable to ensure enlive supplements. Pt reports she lives with her husband and daughter at baseline.     4/20 - EGD, findings: Benign-appearing esophageal stenosis, LA Grade C reflux esophagitis with no bleeding, Esophageal ulcer with no stigmata of recent bleeding, Medium-sized hiatal hernia  Relevant Scheduled Meds: . pantoprazole  40 mg Oral BID  . potassium chloride  40 mEq Oral BID   Relevant Continuous Infusions: . promethazine (PHENERGAN) injection (IM or IVPB) 12.5 mg (10/30/20 1255)   Relevant PRN Meds: alum & mag hydroxide-simeth, ondansetron, promethazine  Labs reviewed:   K - 3.1  Mg - 1.5  SBG ranges from 80-102 mg/dL over the last 24  hours  NUTRITION - FOCUSED PHYSICAL EXAM: Flowsheet Row Most Recent Value  Orbital Region Mild depletion  Upper Arm Region No depletion  Thoracic and Lumbar Region No depletion  Buccal Region No depletion  Temple Region No depletion  Clavicle Bone Region No depletion  Clavicle and Acromion Bone Region No depletion  Scapular Bone Region No depletion  Dorsal Hand No depletion  Patellar Region No depletion  Anterior Thigh Region No depletion  Posterior Calf Region No depletion  Edema (RD Assessment) None  Hair Reviewed  Eyes Reviewed  Mouth Reviewed  Skin Reviewed  Nails Reviewed     Diet Order:   Diet Order            DIET SOFT Room service appropriate? Yes; Fluid consistency: Thin  Diet effective now           Diet general                EDUCATION NEEDS:  No education needs have been identified at this time  Skin:  Skin Assessment: Reviewed RN Assessment  Last BM:  PTA  Height:  Ht Readings from Last 1 Encounters:  03/14/20 5\' 2"  (1.575 m)   Weight:  Wt Readings from Last 1 Encounters:  10/26/20 49.1 kg   Ideal Body Weight:  50 kg  BMI:  Body mass index is 19.8 kg/m.  Estimated Nutritional Needs:   Kcal:  1600-1800 kcal  Protein:  80-90 g  Fluid:  > 1.6L/d   10/28/20, RD, LDN Clinical Dietitian Pager on Amion

## 2020-10-31 NOTE — Plan of Care (Signed)

## 2020-11-12 ENCOUNTER — Encounter: Payer: Self-pay | Admitting: Gastroenterology

## 2020-11-12 ENCOUNTER — Ambulatory Visit: Payer: Medicaid Other | Admitting: Gastroenterology

## 2020-12-23 ENCOUNTER — Ambulatory Visit: Payer: Medicaid Other | Admitting: Gastroenterology

## 2020-12-30 ENCOUNTER — Ambulatory Visit: Payer: Medicaid Other | Admitting: Gastroenterology

## 2021-02-05 ENCOUNTER — Other Ambulatory Visit: Payer: Self-pay

## 2021-02-05 ENCOUNTER — Ambulatory Visit (INDEPENDENT_AMBULATORY_CARE_PROVIDER_SITE_OTHER): Payer: Medicaid Other | Admitting: Gastroenterology

## 2021-02-05 VITALS — BP 111/79 | HR 120 | Temp 98.5°F | Wt 104.2 lb

## 2021-02-05 DIAGNOSIS — R1319 Other dysphagia: Secondary | ICD-10-CM

## 2021-02-05 DIAGNOSIS — K7030 Alcoholic cirrhosis of liver without ascites: Secondary | ICD-10-CM

## 2021-02-05 DIAGNOSIS — D696 Thrombocytopenia, unspecified: Secondary | ICD-10-CM

## 2021-02-05 DIAGNOSIS — D72819 Decreased white blood cell count, unspecified: Secondary | ICD-10-CM

## 2021-02-05 DIAGNOSIS — K209 Esophagitis, unspecified without bleeding: Secondary | ICD-10-CM | POA: Diagnosis not present

## 2021-02-05 NOTE — Patient Instructions (Addendum)
Ultrasound scheduled [facility] entrance arrive at 10:00 am on February 19, 2021   Nothing to eat or drink after 12 midnight the night before

## 2021-02-05 NOTE — Addendum Note (Signed)
Addended by: Roena Malady on: 02/05/2021 03:31 PM   Modules accepted: Orders, SmartSet

## 2021-02-05 NOTE — Progress Notes (Signed)
[name], MD [address]  [address]  [place], [place] [address]  Main: [phone]  Fax: [phone]   Primary Care Physician: [facility]   Chief complaint: Follow-up for liver  HPI: Yvette [name] is a 40 y.o. female with ongoing alcohol use, with previous history of possible cirrhosis here for follow-up.  Patient states she has decreased her alcohol intake from 16 ounce beers a day to 24 ounce beers a day.  She was recently admitted in April 2022 for coffee-ground emesis and EGD showed esophagitis with stricture.  Repeat EGD was recommended in 6 weeks and patient is just following up now with [place].  States she took Protonix twice a day for about a month and then has been taking once a day Protonix.  Does have some solid food dysphagia intermittently.  No episodes of food impaction.  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   History of anemia    History of blood transfusion 1984   Leukopenia    Neutropenia (HCC)     Past Surgical History:  Procedure Laterality Date   ESOPHAGOGASTRODUODENOSCOPY N/A 10/29/2020   Procedure: ESOPHAGOGASTRODUODENOSCOPY (EGD);  Surgeon: Wyline Mood, MD;  Location: [facility] ENDOSCOPY;  Service: Gastroenterology;  [name];   ESOPHAGOGASTRODUODENOSCOPY (EGD) WITH PROPOFOL N/A 03/14/2020   Procedure: ESOPHAGOGASTRODUODENOSCOPY (EGD) WITH PROPOFOL;  Surgeon: Pasty Spillers, MD;  [name];  Service: Endoscopy;  [name];    Prior to Admission medications   Medication Sig Start Date End Date Taking? Authorizing Provider  FLUoxetine (PROZAC) 40 MG capsule Take 40 mg by mouth daily.   Yes [provider]  mirtazapine (REMERON) 30 MG tablet Take 30 mg by mouth at bedtime.   Yes  [provider]  pantoprazole (PROTONIX) 40 MG tablet Take 1 tablet (40 mg total) by mouth 2 (two) times daily. 10/31/20 11/30/20  Charise Killian, MD  promethazine (PHENERGAN) 12.5 MG tablet Take 1 tablet (12.5 mg total) by mouth every 8 (eight) hours as needed for up to 7 days for nausea or vomiting. 10/31/20 11/07/20  Charise Killian, MD    Family History  Problem Relation Age of Onset   Lupus Mother    Cancer Paternal Aunt        Breast    Cancer Paternal Aunt        Breast      Social History   Tobacco Use   Smoking status: Every Day    Packs/day: 0.20    Years: 15.00    Pack years: 3.00    Types: Cigarettes   Smokeless tobacco: Never  Vaping Use   Vaping Use: Never used  Substance Use Topics   Alcohol use: Yes    Alcohol/week: 61.0 standard drinks    Types: 21 Cans of beer, [name]   Drug use: No    Allergies as of 02/05/2021   (No Known Allergies)    Physical Examination:  Constitutional: General:   Alert,  Well-developed, well-nourished, pleasant and cooperative in NAD BP 111/79   Pulse (!) 120   Temp 98.5 F (36.9 C) (Oral)   Wt 104 lb 3.2 oz (47.3 kg)  BMI 19.06 kg/m   Respiratory: Normal respiratory effort  Gastrointestinal:  Soft, non-tender and non-distended without masses, hepatosplenomegaly or hernias noted.  No guarding or rebound tenderness.     Cardiac: No clubbing or edema.  No cyanosis. Normal posterior tibial pedal pulses noted.  Psych:  Alert and cooperative. Normal mood and affect.  Musculoskeletal:  Normal gait. Head normocephalic, atraumatic. Symmetrical without gross deformities. 5/5 Lower extremity strength bilaterally.  Skin: Warm. Intact without significant lesions or rashes. No jaundice.  Neck: Supple, trachea midline  Lymph: No cervical lymphadenopathy  Psych:  Alert and oriented x3, Alert and cooperative. Normal mood and affect.  Labs: CMP      Component Value Date/Time   NA 139 10/31/2020 0346   K 3.1 (L) 10/31/2020 0346   CL 107 10/31/2020 0346   CO2 24 10/31/2020 0346   GLUCOSE 102 (H) 10/31/2020 0346   BUN 7 10/31/2020 0346   CREATININE 0.68 10/31/2020 0346   CALCIUM 8.5 (L) 10/31/2020 0346   PROT 6.2 (L) 10/31/2020 0346   PROT 7.9 01/01/2020 1447   ALBUMIN 3.4 (L) 10/31/2020 0346   ALBUMIN 4.8 01/01/2020 1447   AST 40 10/31/2020 0346   ALT 34 10/31/2020 0346   ALKPHOS 65 10/31/2020 0346   BILITOT 0.8 10/31/2020 0346   BILITOT 0.5 01/01/2020 1447   GFRNONAA >60 10/31/2020 0346   GFRAA >60 11/15/2019 2054   Lab Results  Component Value Date   WBC 2.7 (L) 10/31/2020   HGB 9.8 (L) 10/31/2020   HCT 29.5 (L) 10/31/2020   MCV 84.5 10/31/2020   PLT 111 (L) 10/31/2020    Imaging Studies:   Assessment and Plan:   ALIANYS CHACKO is a 40 y.o. y/o female with a history of alcohol use daily for a few years here for follow-up  Importance of abstaining from alcohol completely discussed in detail with the patient again today.  I have encouraged her to seek out resources such as alcoholics anonymous or talk to her PCP about this in detail as well  She will need repeat EGD to reevaluate her esophagus given grade C esophagitis seen on last exam and need for possible dilation given findings on the last EGD  I have discussed alternative options, risks & benefits,  which include, but are not limited to, bleeding, infection, perforation,respiratory complication & drug reaction.  The patient agrees with this plan & written consent will be obtained.    We had ordered labs on her previous visits that she never had done, and we will reorder for them to be done this time given possibility of cirrhosis on previous work-up, with FibroSure level inconclusive, but thrombocytopenia and previously elevated liver enzymes going along with possibility of cirrhosis  She had anemia on her last hospitalization has not had any labs since hospital  discharge.  We will recheck CBC and evaluate platelets with this as well  She had hypokalemia on her hospitalization that has not been rechecked, will reevaluate with CMP  Recheck INR as well at this time  Obtain right upper quadrant ultrasound at this time as well  Dr [name]

## 2021-02-06 LAB — PROTIME-INR
INR: 1 (ref 0.9–1.2)
Prothrombin Time: 10.7 s (ref 9.1–12.0)

## 2021-02-06 LAB — IRON AND TIBC
Iron Saturation: 10 % — ABNORMAL LOW (ref 15–55)
Iron: 35 ug/dL (ref 27–159)
Total Iron Binding Capacity: 344 ug/dL (ref 250–450)
UIBC: 309 ug/dL (ref 131–425)

## 2021-02-06 NOTE — Addendum Note (Signed)
Addended by: Roena Malady on: 02/06/2021 11:43 AM   Modules accepted: Orders

## 2021-02-06 NOTE — Addendum Note (Signed)
Addended by: Roena Malady on: 02/06/2021 11:20 AM   Modules accepted: Orders

## 2021-02-11 ENCOUNTER — Telehealth: Payer: Self-pay

## 2021-02-11 ENCOUNTER — Telehealth: Payer: Self-pay | Admitting: *Deleted

## 2021-02-11 NOTE — Telephone Encounter (Signed)
Patient cancel the procedure for 02/12/2021. Trish did not know the reason or if she wanted to reschedule

## 2021-02-11 NOTE — Telephone Encounter (Signed)
Yesterday Dr. Donneta Romberg spoke with Dr. Maximino Greenland regarding hematology referral. Patient has been worked up in past and no need for hem referral at this time. Will hold off at this time. Per Dr. Leonard Schwartz- leukopenia and thrombocytopenia is related to patient's alcohol abuse.

## 2021-02-12 LAB — COMPREHENSIVE METABOLIC PANEL
ALT: 66 IU/L — ABNORMAL HIGH (ref 0–32)
AST: 136 IU/L — ABNORMAL HIGH (ref 0–40)
Albumin/Globulin Ratio: 1.6 (ref 1.2–2.2)
Albumin: 4.9 g/dL — ABNORMAL HIGH (ref 3.8–4.8)
Alkaline Phosphatase: 99 IU/L (ref 44–121)
BUN/Creatinine Ratio: 10 (ref 9–23)
BUN: 7 mg/dL (ref 6–20)
Bilirubin Total: 0.2 mg/dL (ref 0.0–1.2)
CO2: 20 mmol/L (ref 20–29)
Calcium: 9.2 mg/dL (ref 8.7–10.2)
Chloride: 101 mmol/L (ref 96–106)
Creatinine, Ser: 0.68 mg/dL (ref 0.57–1.00)
Globulin, Total: 3.1 g/dL (ref 1.5–4.5)
Glucose: 90 mg/dL (ref 65–99)
Potassium: 4.4 mmol/L (ref 3.5–5.2)
Sodium: 141 mmol/L (ref 134–144)
Total Protein: 8 g/dL (ref 6.0–8.5)
eGFR: 115 mL/min/{1.73_m2} (ref >59)

## 2021-02-12 LAB — ANA: ANA Titer 1: NEGATIVE

## 2021-02-12 LAB — CERULOPLASMIN: Ceruloplasmin: 30.7 mg/dL (ref 19.0–39.0)

## 2021-02-12 LAB — IGG: IgG (Immunoglobin G), Serum: 1214 mg/dL (ref 586–1602)

## 2021-02-12 LAB — MITOCHONDRIAL/SMOOTH MUSCLE AB PNL
Mitochondrial Ab: <20 Units (ref 0.0–20.0)
Smooth Muscle Ab: 6 Units (ref 0–19)

## 2021-02-12 LAB — FERRITIN: Ferritin: 129 ng/mL (ref 15–150)

## 2021-02-12 LAB — CBC
Hematocrit: 38.9 % (ref 34.0–46.6)
Hemoglobin: 12 g/dL (ref 11.1–15.9)
MCH: 26.4 pg — ABNORMAL LOW (ref 26.6–33.0)
MCHC: 30.8 g/dL — ABNORMAL LOW (ref 31.5–35.7)
MCV: 86 fL (ref 79–97)
Platelets: 143 10*3/uL — ABNORMAL LOW (ref 150–450)
RBC: 4.55 x10E6/uL (ref 3.77–5.28)
RDW: 17.4 % — ABNORMAL HIGH (ref 11.7–15.4)
WBC: 2.4 10*3/uL — CL (ref 3.4–10.8)

## 2021-02-12 LAB — ANTI-MICROSOMAL ANTIBODY LIVER / KIDNEY: LKM1 Ab: 1.2 Units (ref 0.0–20.0)

## 2021-02-12 LAB — HEPATITIS A ANTIBODY, TOTAL: hep A Total Ab: NEGATIVE

## 2021-02-19 ENCOUNTER — Ambulatory Visit: Payer: Medicaid Other | Attending: Gastroenterology

## 2021-02-23 ENCOUNTER — Emergency Department: Payer: Medicaid Other

## 2021-02-23 ENCOUNTER — Other Ambulatory Visit: Payer: Self-pay

## 2021-02-23 ENCOUNTER — Emergency Department
Admission: EM | Admit: 2021-02-23 | Discharge: 2021-02-23 | Disposition: A | Discharge status: Home / Self Care | Payer: Medicaid Other | Attending: Emergency Medicine | Admitting: Emergency Medicine

## 2021-02-23 DIAGNOSIS — F17210 Nicotine dependence, cigarettes, uncomplicated: Secondary | ICD-10-CM | POA: Diagnosis not present

## 2021-02-23 DIAGNOSIS — R1110 Vomiting, unspecified: Secondary | ICD-10-CM | POA: Diagnosis present

## 2021-02-23 DIAGNOSIS — K2920 Alcoholic gastritis without bleeding: Secondary | ICD-10-CM

## 2021-02-23 DIAGNOSIS — J45909 Unspecified asthma, uncomplicated: Secondary | ICD-10-CM | POA: Insufficient documentation

## 2021-02-23 DIAGNOSIS — Z20822 Contact with and (suspected) exposure to covid-19: Secondary | ICD-10-CM | POA: Diagnosis not present

## 2021-02-23 LAB — COMPREHENSIVE METABOLIC PANEL
ALT: 84 U/L — ABNORMAL HIGH (ref 0–44)
AST: 183 U/L — ABNORMAL HIGH (ref 15–41)
Albumin: 5 g/dL (ref 3.5–5.0)
Alkaline Phosphatase: 88 U/L (ref 38–126)
Anion gap: 16 — ABNORMAL HIGH (ref 5–15)
BUN: 11 mg/dL (ref 6–20)
CO2: 23 mmol/L (ref 22–32)
Calcium: 9.8 mg/dL (ref 8.9–10.3)
Chloride: 103 mmol/L (ref 98–111)
Creatinine, Ser: 0.77 mg/dL (ref 0.44–1.00)
GFR, Estimated: >60 mL/min (ref >60)
Glucose, Bld: 158 mg/dL — ABNORMAL HIGH (ref 70–99)
Potassium: 3.4 mmol/L — ABNORMAL LOW (ref 3.5–5.1)
Sodium: 142 mmol/L (ref 135–145)
Total Bilirubin: 1.3 mg/dL — ABNORMAL HIGH (ref 0.3–1.2)
Total Protein: 9.1 g/dL — ABNORMAL HIGH (ref 6.5–8.1)

## 2021-02-23 LAB — URINALYSIS, COMPLETE (UACMP) WITH MICROSCOPIC
Bacteria, UA: NONE SEEN
Bilirubin Urine: NEGATIVE
Glucose, UA: NEGATIVE mg/dL
Ketones, ur: 80 mg/dL — AB
Leukocytes,Ua: NEGATIVE
Nitrite: NEGATIVE
Protein, ur: >=300 mg/dL — AB
Specific Gravity, Urine: 1.031 — ABNORMAL HIGH (ref 1.005–1.030)
pH: 6 (ref 5.0–8.0)

## 2021-02-23 LAB — CBC
HCT: 38.3 % (ref 36.0–46.0)
Hemoglobin: 12.7 g/dL (ref 12.0–15.0)
MCH: 27.5 pg (ref 26.0–34.0)
MCHC: 33.2 g/dL (ref 30.0–36.0)
MCV: 82.9 fL (ref 80.0–100.0)
Platelets: 92 10*3/uL — ABNORMAL LOW (ref 150–400)
RBC: 4.62 MIL/uL (ref 3.87–5.11)
RDW: 18.1 % — ABNORMAL HIGH (ref 11.5–15.5)
WBC: 6.9 10*3/uL (ref 4.0–10.5)
nRBC: 0 % (ref 0.0–0.2)

## 2021-02-23 LAB — HCG, QUANTITATIVE, PREGNANCY: hCG, Beta Chain, Quant, S: <1 m[IU]/mL (ref <5)

## 2021-02-23 LAB — LIPASE, BLOOD: Lipase: 18 U/L (ref 11–51)

## 2021-02-23 LAB — RESP PANEL BY RT-PCR (FLU A&B, COVID) ARPGX2
Influenza A by PCR: NEGATIVE
Influenza B by PCR: NEGATIVE
SARS Coronavirus 2 by RT PCR: NEGATIVE

## 2021-02-23 LAB — PROTIME-INR
INR: 3.4 — ABNORMAL HIGH (ref 0.8–1.2)
Prothrombin Time: 34.1 seconds — ABNORMAL HIGH (ref 11.4–15.2)

## 2021-02-23 LAB — POC URINE PREG, ED: Preg Test, Ur: NEGATIVE

## 2021-02-23 LAB — APTT: aPTT: 68 seconds — ABNORMAL HIGH (ref 24–36)

## 2021-02-23 MED ORDER — PANTOPRAZOLE SODIUM 40 MG PO TBEC
40.0000 mg | DELAYED_RELEASE_TABLET | Freq: Once | ORAL | Status: AC
  Administered 2021-02-23: 40 mg via ORAL
  Filled 2021-02-23: qty 1

## 2021-02-23 MED ORDER — SODIUM CHLORIDE 0.9 % IV BOLUS
1000.0000 mL | Freq: Once | INTRAVENOUS | Status: AC
  Administered 2021-02-23: 1000 mL via INTRAVENOUS

## 2021-02-23 MED ORDER — PROMETHAZINE HCL 12.5 MG PO TABS: 12.5000 mg | ORAL_TABLET | Freq: Three times a day (TID) | ORAL | 0 refills | Status: DC | PRN

## 2021-02-23 MED ORDER — SODIUM CHLORIDE 0.9 % IV SOLN
25.0000 mg | Freq: Four times a day (QID) | INTRAVENOUS | Status: DC | PRN
  Administered 2021-02-23: 25 mg via INTRAVENOUS
  Filled 2021-02-23 (×2): qty 1

## 2021-02-23 MED ORDER — METOCLOPRAMIDE HCL 10 MG PO TABS
10.0000 mg | ORAL_TABLET | Freq: Once | ORAL | Status: AC
  Administered 2021-02-23: 10 mg via ORAL
  Filled 2021-02-23: qty 1

## 2021-02-23 MED ORDER — PROMETHAZINE HCL 25 MG RE SUPP: 25.0000 mg | Freq: Three times a day (TID) | RECTAL | 0 refills | Status: DC | PRN

## 2021-02-23 NOTE — ED Provider Notes (Signed)
[facility] Department Provider Note  ____________________________________________   Event Date/Time   First MD Initiated Contact with Patient 02/23/21 1234     (approximate)  I have reviewed the triage vital signs and the nursing notes.   HISTORY  Chief Complaint Abdominal Pain    HPI Yvette [name] is a 40 y.o. female presents emergency department with vomiting.  Patient has history of alcoholic gastritis and ketoacidosis.  Patient's last admission was about 4 months ago, states the vomiting started on Saturday, pt continues to drink "a 40 a day".    Past Medical History:  Diagnosis Date   Asthma    H/O 66 disease    as 105 month old child and at 72 years   History of anemia    History of blood transfusion 1984   Leukopenia    Neutropenia (HCC)     Patient Active Problem List   Diagnosis Date Noted   Alcoholic liver disease, unspecified (HCC) 10/26/2020   Alcohol withdrawal (HCC) 10/26/2020   Alcoholic ketoacidosis 10/26/2020   AKI (acute kidney injury) (HCC) 10/26/2020   High serum osmolar gap 10/26/2020   Alcoholic cirrhosis of liver without ascites (HCC)    Hiatal hernia    Portal hypertension (HCC)    Nausea & vomiting 11/10/2019   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   Intractable vomiting 10/01/2019   Alcohol use, daily 09/30/2019   Elevated LFTs 09/30/2019   Thrombocytopenia (HCC) 09/30/2019   Hypokalemia    Intractable vomiting with nausea 08/23/2019   Dehydration    Food poisoning    Other neutropenia (HCC) 06/11/2016   Leukopenia 02/03/2015    Past Surgical History:  Procedure Laterality Date   ESOPHAGOGASTRODUODENOSCOPY N/A 10/29/2020   Procedure: ESOPHAGOGASTRODUODENOSCOPY (EGD);  Surgeon: Wyline Mood, MD;  Location: [facility] ENDOSCOPY;  Service: Gastroenterology;  [name];   ESOPHAGOGASTRODUODENOSCOPY (EGD) WITH PROPOFOL N/A 03/14/2020   Procedure: ESOPHAGOGASTRODUODENOSCOPY (EGD) WITH PROPOFOL;  Surgeon:  Pasty Spillers, MD;  [name];  Service: Endoscopy;  [name];    Prior to Admission medications   Medication Sig Start Date End Date Taking? Authorizing Provider  FLUoxetine (PROZAC) 40 MG capsule Take 40 mg by mouth daily.    [provider]  mirtazapine (REMERON) 30 MG tablet Take 30 mg by mouth at bedtime.    [provider]  pantoprazole (PROTONIX) 40 MG tablet Take 1 tablet (40 mg total) by mouth 2 (two) times daily. 10/31/20 11/30/20  Charise Killian, MD  promethazine (PHENERGAN) 12.5 MG tablet Take 1 tablet (12.5 mg total) by mouth every 8 (eight) hours as needed for up to 7 days for nausea or vomiting. 10/31/20 11/07/20  Charise Killian, MD    Allergies Patient has no known allergies.  Family History  Problem Relation Age of Onset   Lupus Mother    Cancer Paternal Aunt        Breast    Cancer Paternal Aunt        Breast     Social History Social History   Tobacco Use   Smoking status: Every Day    Packs/day: 0.20    Years: 15.00    Pack years: 3.00    Types: Cigarettes   Smokeless tobacco: Never  Vaping Use   Vaping Use: Never used  Substance Use Topics   Alcohol use: Yes    Alcohol/week: 61.0 standard drinks    Types: 21 Cans of beer, 40 Standard drinks or equivalent per week  Comment: daily   Drug use: No    Review of Systems  Constitutional: No fever/chills Eyes: No visual changes. ENT: No sore throat. Respiratory: Denies cough Cardiovascular: Denies chest pain Gastrointestinal: Denies abdominal pain, + vomiting Genitourinary: Negative for dysuria. Musculoskeletal: Negative for back pain. Skin: Negative for rash. Psychiatric: no mood changes,     ____________________________________________   PHYSICAL EXAM:  VITAL SIGNS: ED Triage Vitals  Enc Vitals Group     BP 02/23/21 1149 (!) 155/94     Pulse Rate 02/23/21 1149 (!) 56     Resp 02/23/21 1148 16     Temp 02/23/21 1148 98.7 F (37.1  C)     Temp Source 02/23/21 1148 Oral     SpO2 02/23/21 1148 100 %     Weight 02/23/21 1149 105 lb (47.6 kg)     Height 02/23/21 1149 5\' 1"  (1.549 m)     Head Circumference --      Peak Flow --      Pain Score 02/23/21 1149 0     Pain Loc --      Pain Edu? --      Excl. in GC? --     Constitutional: Alert and oriented. Well appearing and in no acute distress. Eyes: Conjunctivae are normal.  Head: Atraumatic. Nose: No congestion/rhinnorhea. Mouth/Throat: Mucous membranes are moist.   Neck:  supple no lymphadenopathy noted Cardiovascular: Normal rate, regular rhythm. Heart sounds are normal Respiratory: Normal respiratory effort.  No retractions, lungs c t a  Abd: soft nontender bs normal all 4 quad, patient actively vomiting GU: deferred Musculoskeletal: FROM all extremities, warm and well perfused Neurologic:  Normal speech and language.  Skin:  Skin is warm, dry and intact. No rash noted. Psychiatric: Mood and affect are normal. Speech and behavior are normal.  ____________________________________________   LABS (all labs ordered are listed, but only abnormal results are displayed)  Labs Reviewed  COMPREHENSIVE METABOLIC PANEL - Abnormal; Notable for the following components:      Result Value   Potassium 3.4 (*)    Glucose, Bld 158 (*)    Total Protein 9.1 (*)    AST 183 (*)    ALT 84 (*)    Total Bilirubin 1.3 (*)    Anion gap 16 (*)    All other components within normal limits  CBC - Abnormal; Notable for the following components:   RDW 18.1 (*)    Platelets 92 (*)    All other components within normal limits  APTT - Abnormal; Notable for the following components:   aPTT 68 (*)    All other components within normal limits  PROTIME-INR - Abnormal; Notable for the following components:   Prothrombin Time 34.1 (*)    INR 3.4 (*)    All other components within normal limits  RESP PANEL BY RT-PCR (FLU A&B, COVID) ARPGX2  LIPASE, BLOOD  HCG, QUANTITATIVE,  PREGNANCY  URINALYSIS, COMPLETE (UACMP) WITH MICROSCOPIC  POC URINE PREG, ED   ____________________________________________   ____________________________________________  RADIOLOGY  Abdomen with chest  ____________________________________________   PROCEDURES  Procedure(s) performed: No  Procedures    ____________________________________________   INITIAL IMPRESSION / ASSESSMENT AND PLAN / ED COURSE  Pertinent labs & imaging results that were available during my care of the patient were reviewed by me and considered in my medical decision making (see chart for details).   The patient is a 40 year old female presents emergency department with vomiting.  Patient has history of alcoholic gastritis  and ketoacidosis.  Physical exam shows patient be actively vomiting.  She appears to be stable  DDx: Bowel obstruction, alcoholic gastritis, viral illness causing vomiting, acute cholecystitis, acute appendicitis  CBC has decreased platelets of 92, this is greatly decreased from 2 weeks ago when she had a platelet count of 142 Comprehensive metabolic panel has elevated glucose of 158, elevated AST of 183, ALT is 84, and total bili is 1.3, anion gap is also elevated at 16, all of these values have increased from her last lab draw  X-ray of the abdomen with chest  Patient is still vomiting.  We are waiting on Phenergan from the pharmacy.  She was given ice chips. Care is being transferred to [facility], PA-C.  She is aware of patient's complaints and status.  Yvette [name] was evaluated in Emergency Department on 02/23/2021 for the symptoms described in the history of present illness. She was evaluated in the context of the global COVID-19 pandemic, which necessitated consideration that the patient might be at risk for infection with the SARS-CoV-2 virus that causes COVID-19. Institutional protocols and algorithms that pertain to the evaluation of patients at risk for COVID-19 are  in a state of rapid change based on information released by regulatory bodies including the CDC and federal and state organizations. These policies and algorithms were followed during the patient's care in the ED.    As part of my medical decision making, I reviewed the following data within the electronic MEDICAL RECORD NUMBER Nursing notes reviewed and incorporated, Labs reviewed , Old chart reviewed, Patient signed out to Emelda Brothers, PA-C, Radiograph reviewed , Notes from prior ED visits, and [place] Controlled Substance Database  ____________________________________________   FINAL CLINICAL IMPRESSION(S) / ED DIAGNOSES  Final diagnoses:  Acute alcoholic gastritis without hemorrhage      NEW MEDICATIONS STARTED DURING THIS VISIT:  New Prescriptions   No medications on file     Note:  This document was prepared using Dragon voice recognition software and may include unintentional dictation errors.    Faythe Ghee, PA-C 02/23/21 1512    Minna Antis, MD 02/24/21 1319

## 2021-02-23 NOTE — ED Triage Notes (Signed)
First Nurse Note:  Arrives via [facility].  Admits to drinking a 40 a day.  Has history of esophageal ulcers and liver damage.  C/O reflux x 24 hours.  VS wnl.

## 2021-02-23 NOTE — ED Provider Notes (Addendum)
----------------------------------------- 5:31 PM on 02/23/2021 -----------------------------------------  Blood pressure (!) 151/84, pulse 60, temperature 98.7 F (37.1 C), temperature source Oral, resp. rate 19, height 5\' 1"  (1.549 m), weight 47.6 kg, SpO2 100 %.  Assuming care from Dr. , PA-C/NP-C.  In short, Yvette [name] is a 40 y.o. female with a chief complaint of Abdominal Pain .  Refer to the original H&P for additional details.  The current plan of care is to await fluid resuscitation and pending labs.  Patient has been stable throughout her course in the ED.  She has been intermittently napping in the room.  There is subsequent emesis has been appreciated.  She has requested admission to the ED for continued IV hydration and IV antiemetics.  Notify the patient that she is currently stable at this time without any indication for admission as she denies any subsequent emesis and is tolerating p.o.  She was offered a oral dose of metoclopramide prior to discharge.  Has been witnessed attempting to cause herself to vomit by placing her fingers in her throat.  She notes that she is attempting to "clear her stomach, so she does not have emesis after taking the oral metoclopramide.  ____________________________________________   LABS (pertinent positives/negatives)  Labs Reviewed  COMPREHENSIVE METABOLIC PANEL - Abnormal; Notable for the following components:      Result Value   Potassium 3.4 (*)    Glucose, Bld 158 (*)    Total Protein 9.1 (*)    AST 183 (*)    ALT 84 (*)    Total Bilirubin 1.3 (*)    Anion gap 16 (*)    All other components within normal limits  CBC - Abnormal; Notable for the following components:   RDW 18.1 (*)    Platelets 92 (*)    All other components within normal limits  URINALYSIS, COMPLETE (UACMP) WITH MICROSCOPIC - Abnormal; Notable for the following components:   Color, Urine AMBER (*)    APPearance HAZY (*)    Specific Gravity, Urine  1.031 (*)    Hgb urine dipstick MODERATE (*)    Ketones, ur 80 (*)    Protein, ur >=300 (*)    All other components within normal limits  APTT - Abnormal; Notable for the following components:   aPTT 68 (*)    All other components within normal limits  PROTIME-INR - Abnormal; Notable for the following components:   Prothrombin Time 34.1 (*)    INR 3.4 (*)    All other components within normal limits  RESP PANEL BY RT-PCR (FLU A&B, COVID) ARPGX2  LIPASE, BLOOD  HCG, QUANTITATIVE, PREGNANCY  POC URINE PREG, ED  ____________________________________________  EKG  ____________________________________________   RADIOLOGY  ____________________________________________  PROCEDURES  Metoclopramide 10 mg PO Pantoprazole 40 mg PO  Procedures ____________________________________________  INITIAL IMPRESSION / ASSESSMENT AND PLAN / ED COURSE  Differential diagnosis includes, but is not limited to, biliary disease (biliary colic, acute cholecystitis, cholangitis, choledocholithiasis, etc), intrathoracic causes for epigastric abdominal pain including ACS, gastritis, duodenitis, pancreatitis, small bowel or large bowel obstruction, abdominal aortic aneurysm, hernia, and ulcer(s).  Patient with a history of liver disease, and alcoholic gastritis, presents to the ED for evaluation abdominal pain and vomiting.  Patient's had a stable course in the ED.  She has been treated with IV Phenergan, oral metoclopramide, and pantoprazole.  Patient has had self-induced episodes of nonbloody, nonbilious emesis but no intractable hyperemesis.  She will be discharged to follow-up with her GI provider in  2 days for scheduled EGD.  She is discharged with prescriptions for Phenergan suppositories and Phenergan tablets.  She is encouraged to take whichever form she can tolerate most easily for any ongoing emesis.  Return precautions have been reviewed.  Yvette [name] was evaluated in Emergency Department on  02/23/2021 for the symptoms described in the history of present illness. She was evaluated in the context of the global COVID-19 pandemic, which necessitated consideration that the patient might be at risk for infection with the SARS-CoV-2 virus that causes COVID-19. Institutional protocols and algorithms that pertain to the evaluation of patients at risk for COVID-19 are in a state of rapid change based on information released by regulatory bodies including the CDC and federal and state organizations. These policies and algorithms were followed during the patient's care in the ED. ____________________________________________  FINAL CLINICAL IMPRESSION(S) / ED DIAGNOSES  Final diagnoses:  Acute alcoholic gastritis without hemorrhage      Karmen Stabs, Charlesetta Ivory, PA-C 02/23/21 1904    Lissa Hoard, PA-C 02/23/21 1905    Georga Hacking, MD 02/23/21 2016

## 2021-02-23 NOTE — ED Triage Notes (Signed)
Pt to ED for n/v that started last night, states she has acid reflux. Nad noted Denies pain  Sig pad not working , verbalized MSE waiver

## 2021-02-23 NOTE — ED Notes (Signed)
Patient transported to X-ray 

## 2021-02-23 NOTE — ED Notes (Signed)
Pt sitting up in bed, NAD. A/ox4, states had sudden onset episodes of n/v with bile emesis. Pt states she has had this happen 5-6 times this year and that she has GERD and that's what the doctors think causes it. Pt denies current ABD pain or n/v at this time s/p receiving antiemetics.

## 2021-02-23 NOTE — ED Notes (Signed)
Lab called to collect blood specimens.

## 2021-02-23 NOTE — Discharge Instructions (Addendum)
Your exam and labs are reassuring at this time.  You should follow-up with a GI specialist in 2 days as scheduled.  Take the nausea medicine as prescribed, and a daily pantoprazole as previously written.  Continue to drink clear fluids and follow-up with your primary provider for ongoing symptoms.

## 2021-02-23 NOTE — ED Notes (Signed)
Pt NAD, verbalizes understanding of all DC and f/u instructions. All questions answered. Pt walks with steady gait to lobby at DC

## 2021-02-23 NOTE — ED Notes (Signed)
Pt accidentally pulls IV out. Provider made aware

## 2021-02-25 ENCOUNTER — Encounter: Payer: Self-pay | Admitting: Gastroenterology

## 2021-02-25 ENCOUNTER — Ambulatory Visit: Payer: Medicaid Other | Admitting: Anesthesiology

## 2021-02-25 ENCOUNTER — Encounter
Admission: RE | Disposition: A | Discharge status: Home / Self Care | Payer: Self-pay | Source: Home / Self Care | Attending: Gastroenterology

## 2021-02-25 ENCOUNTER — Ambulatory Visit
Admission: RE | Admit: 2021-02-25 | Discharge: 2021-02-25 | Disposition: A | Discharge status: Home / Self Care | Payer: Medicaid Other | Attending: Gastroenterology | Admitting: Gastroenterology

## 2021-02-25 DIAGNOSIS — K209 Esophagitis, unspecified without bleeding: Secondary | ICD-10-CM

## 2021-02-25 DIAGNOSIS — K449 Diaphragmatic hernia without obstruction or gangrene: Secondary | ICD-10-CM | POA: Insufficient documentation

## 2021-02-25 DIAGNOSIS — Z79899 Other long term (current) drug therapy: Secondary | ICD-10-CM | POA: Insufficient documentation

## 2021-02-25 DIAGNOSIS — R112 Nausea with vomiting, unspecified: Secondary | ICD-10-CM | POA: Insufficient documentation

## 2021-02-25 DIAGNOSIS — Z8719 Personal history of other diseases of the digestive system: Secondary | ICD-10-CM | POA: Diagnosis not present

## 2021-02-25 DIAGNOSIS — Z87891 Personal history of nicotine dependence: Secondary | ICD-10-CM | POA: Diagnosis not present

## 2021-02-25 HISTORY — PX: ESOPHAGOGASTRODUODENOSCOPY (EGD) WITH PROPOFOL: SHX5813

## 2021-02-25 LAB — POCT PREGNANCY, URINE: Preg Test, Ur: NEGATIVE

## 2021-02-25 SURGERY — ESOPHAGOGASTRODUODENOSCOPY (EGD) WITH PROPOFOL
Anesthesia: General

## 2021-02-25 MED ORDER — MIDAZOLAM HCL 2 MG/2ML IJ SOLN
INTRAMUSCULAR | Status: DC | PRN
  Administered 2021-02-25: 2 mg via INTRAVENOUS

## 2021-02-25 MED ORDER — PROPOFOL 10 MG/ML IV BOLUS
INTRAVENOUS | Status: AC
  Filled 2021-02-25: qty 20

## 2021-02-25 MED ORDER — DEXAMETHASONE SODIUM PHOSPHATE 10 MG/ML IJ SOLN
INTRAMUSCULAR | Status: DC | PRN
  Administered 2021-02-25: 10 mg via INTRAVENOUS

## 2021-02-25 MED ORDER — MIDAZOLAM HCL 2 MG/2ML IJ SOLN
INTRAMUSCULAR | Status: AC
  Filled 2021-02-25: qty 2

## 2021-02-25 MED ORDER — DROPERIDOL 2.5 MG/ML IJ SOLN
0.6250 mg | Freq: Once | INTRAMUSCULAR | Status: DC | PRN
  Filled 2021-02-25: qty 2

## 2021-02-25 MED ORDER — FENTANYL CITRATE (PF) 100 MCG/2ML IJ SOLN: 25.0000 ug | INTRAMUSCULAR | Status: DC | PRN

## 2021-02-25 MED ORDER — PROPOFOL 500 MG/50ML IV EMUL
INTRAVENOUS | Status: DC | PRN
  Administered 2021-02-25: 150 ug/kg/min via INTRAVENOUS

## 2021-02-25 MED ORDER — ONDANSETRON HCL 4 MG/2ML IJ SOLN
INTRAMUSCULAR | Status: DC | PRN
  Administered 2021-02-25: 4 mg via INTRAVENOUS

## 2021-02-25 MED ORDER — DEXMEDETOMIDINE HCL IN NACL 200 MCG/50ML IV SOLN
INTRAVENOUS | Status: DC | PRN
  Administered 2021-02-25 (×2): 12 ug via INTRAVENOUS

## 2021-02-25 MED ORDER — ONDANSETRON HCL 4 MG/2ML IJ SOLN
INTRAMUSCULAR | Status: AC
  Filled 2021-02-25: qty 2

## 2021-02-25 MED ORDER — SUCCINYLCHOLINE CHLORIDE 200 MG/10ML IV SOSY
PREFILLED_SYRINGE | INTRAVENOUS | Status: AC
  Filled 2021-02-25: qty 10

## 2021-02-25 MED ORDER — PROPOFOL 10 MG/ML IV BOLUS
INTRAVENOUS | Status: DC | PRN
Start: 2021-02-25 — End: 2021-02-25
  Administered 2021-02-25: 100 mg via INTRAVENOUS

## 2021-02-25 MED ORDER — LIDOCAINE HCL (CARDIAC) PF 100 MG/5ML IV SOSY
PREFILLED_SYRINGE | INTRAVENOUS | Status: DC | PRN
  Administered 2021-02-25: 50 mg via INTRAVENOUS

## 2021-02-25 MED ORDER — SODIUM CHLORIDE 0.9 % IV SOLN: INTRAVENOUS | Status: DC

## 2021-02-25 MED ORDER — LIDOCAINE HCL (PF) 2 % IJ SOLN
INTRAMUSCULAR | Status: AC
  Filled 2021-02-25: qty 5

## 2021-02-25 MED ORDER — SUCCINYLCHOLINE CHLORIDE 200 MG/10ML IV SOSY
PREFILLED_SYRINGE | INTRAVENOUS | Status: DC | PRN
  Administered 2021-02-25: 80 mg via INTRAVENOUS

## 2021-02-25 NOTE — Anesthesia Postprocedure Evaluation (Signed)
Anesthesia Post Note  Patient: Yvette [name]  Procedure(s) Performed: ESOPHAGOGASTRODUODENOSCOPY (EGD) WITH PROPOFOL  Patient location during evaluation: PACU Anesthesia Type: General Level of consciousness: awake and alert Pain management: pain level controlled Vital Signs Assessment: post-procedure vital signs reviewed and stable Respiratory status: spontaneous breathing, nonlabored ventilation, respiratory function stable and patient connected to nasal cannula oxygen Cardiovascular status: blood pressure returned to baseline and stable Postop Assessment: no apparent nausea or vomiting Anesthetic complications: no   No notable events documented.   Last Vitals:  Vitals:   02/25/21 1100 02/25/21 1115  BP: 116/82 110/83  Pulse: 82 67  Resp: 14 19  Temp:    SpO2: 100% 100%    Last Pain:  Vitals:   02/25/21 1115  TempSrc:   PainSc: 0-No pain                 Corinda Gubler

## 2021-02-25 NOTE — Anesthesia Procedure Notes (Signed)
Procedure Name: Intubation Date/Time: 02/25/2021 10:31 AM Performed by: Omer Jack, CRNA Pre-anesthesia Checklist: Patient identified, Patient being monitored, Timeout performed, Emergency Drugs available and Suction available Patient Re-evaluated:Patient Re-evaluated prior to induction Oxygen Delivery Method: Circle system utilized Preoxygenation: Pre-oxygenation with 100% oxygen Induction Type: IV induction Ventilation: Mask ventilation without difficulty Laryngoscope Size: 3 and McGraph Grade View: Grade I Tube type: Oral Tube size: 6.5 mm Number of attempts: 1 Airway Equipment and Method: Stylet Placement Confirmation: ETT inserted through vocal cords under direct vision, positive ETCO2 and breath sounds checked- equal and bilateral Secured at: 21 cm Tube secured with: Tape Dental Injury: Teeth and Oropharynx as per pre-operative assessment

## 2021-02-25 NOTE — H&P (Signed)
[name], MD [address], [address], [place], [place], [address] [address], [address], [place], [place], [address] Phone: [phone]  Fax: [phone]  Primary Care Physician:  [facility]   Pre-Procedure History & Physical: HPI:  Yvette [name] is a 40 y.o. female is here for an EGD.   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   History of anemia    History of blood transfusion 1984   Leukopenia    Neutropenia (HCC)     Past Surgical History:  Procedure Laterality Date   ESOPHAGOGASTRODUODENOSCOPY N/A 10/29/2020   Procedure: ESOPHAGOGASTRODUODENOSCOPY (EGD);  Surgeon: Wyline Mood, MD;  Location: [facility] ENDOSCOPY;  Service: Gastroenterology;  [name];   ESOPHAGOGASTRODUODENOSCOPY (EGD) WITH PROPOFOL N/A 03/14/2020   Procedure: ESOPHAGOGASTRODUODENOSCOPY (EGD) WITH PROPOFOL;  Surgeon: Pasty Spillers, MD;  [name];  Service: Endoscopy;  [name];    Prior to Admission medications   Medication Sig Start Date End Date Taking? Authorizing Provider  pantoprazole (PROTONIX) 40 MG tablet Take 1 tablet (40 mg total) by mouth 2 (two) times daily. 10/31/20 02/25/21 Yes Charise Killian, MD  FLUoxetine (PROZAC) 40 MG capsule Take 40 mg by mouth daily. Patient not taking: Reported on 02/25/2021    [provider]  mirtazapine (REMERON) 30 MG tablet Take 30 mg by mouth at bedtime. Patient not taking: Reported on 02/25/2021    [provider]  promethazine (PHENERGAN) 12.5 MG tablet Take 1 tablet (12.5 mg total) by mouth every 8 (eight) hours as needed for up to 7 days for nausea or vomiting. 02/23/21 03/02/21  Menshew, Charlesetta Ivory, PA-C  promethazine (PHENERGAN) 25 MG suppository Place 1 suppository (25 mg total) rectally every 8 (eight) hours as needed for nausea or vomiting. 02/23/21   Menshew, Charlesetta Ivory, PA-C    Allergies  as of 02/06/2021   (No Known Allergies)    Family History  Problem Relation Age of Onset   Lupus Mother    Cancer Paternal Aunt        Breast    Cancer Paternal Aunt        Breast     Social History   Socioeconomic History   Marital status: Single    Spouse name: Not on file   Number of children: Not on file   Years of education: Not on file   Highest education level: Not on file  Occupational History   Not on file  Tobacco Use   Smoking status: Every Day    Packs/day: 0.20    Years: 15.00    Pack years: 3.00    Types: Cigarettes   Smokeless tobacco: Never  Vaping Use   Vaping Use: Never used  Substance and Sexual Activity   Alcohol use: Yes    Alcohol/week: 61.0 standard drinks    Types: 21 Cans of beer, [name]   Drug use: No   Sexual activity: Yes    Partners: Male    Birth control/protection: Injection  Other Topics Concern   Not on file  Social History Narrative   Not on file   Social Determinants of Health   Financial Resource Strain: Not on file  Food Insecurity: Not on file  Transportation Needs: Not on file  Physical Activity: Not on file  Stress: Not on file  Social Connections: Not on file  Intimate Partner Violence: Not on file    Review of Systems: See HPI, otherwise negative ROS  Constitutional: General:   Alert,  Well-developed, well-nourished, pleasant and cooperative in NAD BP (!) 127/96   Pulse 98   Temp (!) 96.9 F (36.1 C) (Temporal)   Ht 5\' 1"  (1.549 m)   Wt 47.6 kg   BMI 19.84 kg/m   Head: Normocephalic, atraumatic.   Eyes:  Sclera clear, no icterus.   Conjunctiva pink.   Mouth:  No deformity or lesions, oropharynx pink & moist.  Neck:  Supple, trachea midline  Respiratory: Normal respiratory effort  Gastrointestinal:  Soft, non-tender and non-distended without masses, hepatosplenomegaly or hernias noted.  No guarding or rebound tenderness.     Cardiac: No  clubbing or edema.  No cyanosis. Normal posterior tibial pedal pulses noted.  Lymphatic:  No significant cervical adenopathy.  Psych:  Alert and cooperative. Normal mood and affect.  Musculoskeletal:   Symmetrical without gross deformities. 5/5 Lower extremity strength bilaterally.  Skin: Warm. Intact without significant lesions or rashes. No jaundice.  Neurologic:  Face symmetrical, tongue midline, Normal sensation to touch;  grossly normal neurologically.  Psych:  Alert and oriented x3, Alert and cooperative. Normal mood and affect.  Impression/Plan: Yvette [name] is here for an EGD for follow up of esophagitis and esophageal stricture.   Risks, benefits, limitations, and alternatives regarding the procedure have been reviewed with the patient.  Questions have been answered.  All parties agreeable.   Marisa Severin, MD  02/25/2021, 9:38 AM

## 2021-02-25 NOTE — Anesthesia Preprocedure Evaluation (Signed)
Anesthesia Evaluation  Patient identified by MRN, date of birth, ID band Patient awake  General Assessment Comment:  Patient actively retching/bringing up watery mucus into emesis bag in front of me.  Reviewed: Allergy & Precautions, NPO status , Patient's Chart, lab work & pertinent test results  History of Anesthesia Complications (+) PONVNegative for: history of anesthetic complications  Airway Mallampati: I  TM Distance: >3 FB Neck ROM: Full    Dental no notable dental hx. (+) Teeth Intact   Pulmonary asthma , neg sleep apnea, neg COPD, Current Smoker and Patient abstained from smoking.,    Pulmonary exam normal breath sounds clear to auscultation       Cardiovascular Exercise Tolerance: Good METS(-) hypertension(-) CAD and (-) Past MI (-) dysrhythmias  Rhythm:Regular Rate:Normal - Systolic murmurs    Neuro/Psych negative neurological ROS  negative psych ROS   GI/Hepatic hiatal hernia, GERD  ,(+) Cirrhosis     substance abuse  alcohol use,   Endo/Other  neg diabetes  Renal/GU negative Renal ROS     Musculoskeletal   Abdominal   Peds  Hematology  (+) Blood dyscrasia, , thrombocytopenia   Anesthesia Other Findings Past Medical History: No date: Asthma No date: H/O 86 disease     Comment:  as 75 month old child and at 68 years No date: History of anemia 1984: History of blood transfusion No date: Leukopenia No date: Neutropenia (HCC)  Reproductive/Obstetrics                             Anesthesia Physical Anesthesia Plan  ASA: 3  Anesthesia Plan: General   Post-op Pain Management:    Induction: Intravenous and Rapid sequence  PONV Risk Score and Plan: 3 and Ondansetron, Dexamethasone, Propofol infusion, TIVA and Diphenhydramine  Airway Management Planned: Oral ETT  Additional Equipment: None  Intra-op Plan:   Post-operative Plan: Extubation in OR  Informed  Consent: I have reviewed the patients History and Physical, chart, labs and discussed the procedure including the risks, benefits and alternatives for the proposed anesthesia with the patient or authorized representative who has indicated his/her understanding and acceptance.     Dental advisory given  Plan Discussed with: CRNA and Surgeon  Anesthesia Plan Comments: (Discussed risks of anesthesia with patient, including PONV, sore throat, lip/dental damage, aspiration. Rare risks discussed as well, such as cardiorespiratory and neurological sequelae, and allergic reactions. Patient understands. Counseled on alcohol cessation and outcomes/complications of continued drinking (cirrhosis, liver transplant))        Anesthesia Quick Evaluation

## 2021-02-25 NOTE — Transfer of Care (Signed)
Immediate Anesthesia Transfer of Care Note  Patient: Yvette [name]  Procedure(s) Performed: ESOPHAGOGASTRODUODENOSCOPY (EGD) WITH PROPOFOL  Patient Location: PACU  Anesthesia Type:General  Level of Consciousness: drowsy and patient cooperative  Airway & Oxygen Therapy: Patient Spontanous Breathing and Patient connected to nasal cannula oxygen  Post-op Assessment: Report given to RN and Post -op Vital signs reviewed and stable  Post vital signs: Reviewed and stable  Last Vitals:  Vitals Value Taken Time  BP 115/83 02/25/21 1057  Temp    Pulse 87 02/25/21 1057  Resp 14 02/25/21 1057  SpO2 100 % 02/25/21 1057  Vitals shown include unvalidated device data.  Last Pain:  Vitals:   02/25/21 0924  TempSrc: Temporal  PainSc: 0-No pain         Complications: No notable events documented.

## 2021-02-25 NOTE — Op Note (Signed)
[facility] Patient Name: Yvette [name] Procedure Date: 02/25/2021 9:31 AM MRN: [id number] Account #: [id number] Date of Birth: [birth year] Admit Type: Outpatient Age: 40 Room: [facility] ENDO ROOM 4 Gender: Female Note Status: Finalized Procedure:             Upper GI endoscopy Indications:           Follow-up of esophagitis, Nausea with vomiting Providers:             Jaaron Oleson B. Maximino Greenland MD, MD Medicines:             Monitored Anesthesia Care Complications:         No immediate complications. Procedure:             Pre-Anesthesia Assessment:                        - Prior to the procedure, a History and Physical was                         performed, and patient medications, allergies and                         sensitivities were reviewed. The patient's tolerance                         of previous anesthesia was reviewed.                        - The risks and benefits of the procedure and the                         sedation options and risks were discussed with the                         patient. All questions were answered and informed                         consent was obtained.                        - Patient identification and proposed procedure were                         verified prior to the procedure by the physician, the                         nurse, the anesthesiologist, the anesthetist and the                         technician. The procedure was verified in the                         procedure room.                        - ASA Grade Assessment: II - A patient with mild                         systemic disease.  After obtaining informed consent, the endoscope was                         passed under direct vision. Throughout the procedure,                         the patient's blood pressure, pulse, and oxygen                         saturations were monitored continuously. The Endoscope                          was introduced through the mouth, and advanced to the                         second part of duodenum. The upper GI endoscopy was                         accomplished with ease. The patient tolerated the                         procedure well. Findings:      Mucosal changes including circumferential folds were found in the mid       esophagus. Esophageal findings were graded using the Eosinophilic       Esophagitis Endoscopic Reference Score (EoE-EREFS) as: Edema Grade 0       Normal (distinct vascular markings), Rings Grade 1 Mild (subtle       circumferential ridges seen on esophageal distension), Exudates Grade 0       None (no white lesions seen), Furrows Grade 0 None (no vertical lines       seen) and Stricture none (no stricture found). Biopsies were obtained       from the proximal and distal esophagus with cold forceps for histology       of suspected eosinophilic esophagitis.      There is no endoscopic evidence of esophagitis or stricture in the       entire esophagus.      A 4 cm hiatal hernia was present.      The entire examined stomach was normal. Biopsies were obtained in the       gastric body, at the incisura and in the gastric antrum with cold       forceps for histology. Biopsies were taken with a cold forceps for       Helicobacter pylori testing. The biopsies were done due to patient's       Nausea/vomiting symptoms.      The duodenal bulb, second portion of the duodenum and examined duodenum       were normal. Impression:            - Esophageal mucosal changes. Biopsied.                        - 4 cm hiatal hernia.                        - Normal stomach. Biopsied.                        - Normal duodenal bulb, second portion of the duodenum  and examined duodenum.                        - Biopsies were obtained in the gastric body, at the                         incisura and in the gastric antrum. Recommendation:        - Await  pathology results.                        - Follow an antireflux regimen.                        - Return to my office as previously scheduled.                        - Discharge patient to home (with escort).                        - Advance diet as tolerated.                        - Continue present medications.                        - Patient has a contact number available for                         emergencies. The signs and symptoms of potential                         delayed complications were discussed with the patient.                         Return to normal activities tomorrow. Written                         discharge instructions were provided to the patient.                        - Discharge patient to home (with escort).                        - The findings and recommendations were discussed with                         the patient.                        - The findings and recommendations were discussed with                         the patient's family. Procedure Code(s):     --- Professional ---                        [phone], Esophagogastroduodenoscopy, flexible,                         transoral; with biopsy, single or multiple Diagnosis Code(s):     --- Professional ---  K22.8, Other specified diseases of esophagus                        K44.9, Diaphragmatic hernia without obstruction or                         gangrene                        K20.90, Esophagitis, unspecified without bleeding                        R11.2, Nausea with vomiting, unspecified CPT copyright 2019 [employer]. All rights reserved. The codes documented in this report are preliminary and upon coder review may  be revised to meet current compliance requirements.  [name], MD Michel Bickers B. Maximino Greenland MD, MD 02/25/2021 10:52:31 AM This report has been signed electronically. Number of Addenda: 0 Note Initiated On: 02/25/2021 9:31 AM Estimated Blood  Loss:  Estimated blood loss: none.      [facility]

## 2021-02-26 ENCOUNTER — Encounter: Payer: Self-pay | Admitting: Gastroenterology

## 2021-02-27 LAB — SURGICAL PATHOLOGY

## 2021-03-03 ENCOUNTER — Encounter: Payer: Self-pay | Admitting: Gastroenterology

## 2021-03-19 ENCOUNTER — Telehealth: Payer: Self-pay | Admitting: Gastroenterology

## 2021-03-19 NOTE — Telephone Encounter (Signed)
Pt. Requesting a call back about a referrral that was supposed to be sent to a hematologist and another Dr. She says that she was told that she will be called.

## 2021-03-20 NOTE — Telephone Encounter (Signed)
Yvette Butte, RN at 02/11/2021 12:31 PM  Status: Signed  Yesterday Dr. Donneta [name] spoke with Dr. [name] regarding hematology referral. Patient has been worked up in past and no need for hem referral at this time. Will hold off at this time. Per Dr. [name]- leukopenia and thrombocytopenia is related to patient's alcohol abuse.

## 2021-06-05 ENCOUNTER — Emergency Department
Admission: EM | Admit: 2021-06-05 | Discharge: 2021-06-05 | Disposition: A | Discharge status: Home / Self Care | Payer: Medicaid Other | Attending: Emergency Medicine | Admitting: Emergency Medicine

## 2021-06-05 ENCOUNTER — Other Ambulatory Visit: Payer: Self-pay

## 2021-06-05 ENCOUNTER — Emergency Department: Payer: Medicaid Other

## 2021-06-05 DIAGNOSIS — R7401 Elevation of levels of liver transaminase levels: Secondary | ICD-10-CM | POA: Insufficient documentation

## 2021-06-05 DIAGNOSIS — R112 Nausea with vomiting, unspecified: Secondary | ICD-10-CM | POA: Diagnosis not present

## 2021-06-05 DIAGNOSIS — R7989 Other specified abnormal findings of blood chemistry: Secondary | ICD-10-CM

## 2021-06-05 DIAGNOSIS — E860 Dehydration: Secondary | ICD-10-CM | POA: Insufficient documentation

## 2021-06-05 DIAGNOSIS — R9431 Abnormal electrocardiogram [ECG] [EKG]: Secondary | ICD-10-CM

## 2021-06-05 DIAGNOSIS — K7010 Alcoholic hepatitis without ascites: Secondary | ICD-10-CM | POA: Diagnosis not present

## 2021-06-05 DIAGNOSIS — J45909 Unspecified asthma, uncomplicated: Secondary | ICD-10-CM | POA: Insufficient documentation

## 2021-06-05 DIAGNOSIS — F17210 Nicotine dependence, cigarettes, uncomplicated: Secondary | ICD-10-CM | POA: Insufficient documentation

## 2021-06-05 DIAGNOSIS — I4581 Long QT syndrome: Secondary | ICD-10-CM | POA: Diagnosis not present

## 2021-06-05 LAB — URINALYSIS, ROUTINE W REFLEX MICROSCOPIC
Glucose, UA: NEGATIVE mg/dL
Ketones, ur: 80 mg/dL — AB
Leukocytes,Ua: NEGATIVE
Nitrite: NEGATIVE
Protein, ur: >300 mg/dL — AB
Specific Gravity, Urine: >1.03 — ABNORMAL HIGH (ref 1.005–1.030)
pH: 5.5 (ref 5.0–8.0)

## 2021-06-05 LAB — PROTIME-INR
INR: 1.1 (ref 0.8–1.2)
Prothrombin Time: 13.9 seconds (ref 11.4–15.2)

## 2021-06-05 LAB — PREGNANCY, URINE: Preg Test, Ur: NEGATIVE

## 2021-06-05 LAB — COMPREHENSIVE METABOLIC PANEL
ALT: 110 U/L — ABNORMAL HIGH (ref 0–44)
AST: 308 U/L — ABNORMAL HIGH (ref 15–41)
Albumin: 4.8 g/dL (ref 3.5–5.0)
Alkaline Phosphatase: 127 U/L — ABNORMAL HIGH (ref 38–126)
Anion gap: 17 — ABNORMAL HIGH (ref 5–15)
BUN: 11 mg/dL (ref 6–20)
CO2: 21 mmol/L — ABNORMAL LOW (ref 22–32)
Calcium: 9.6 mg/dL (ref 8.9–10.3)
Chloride: 101 mmol/L (ref 98–111)
Creatinine, Ser: 0.84 mg/dL (ref 0.44–1.00)
GFR, Estimated: >60 mL/min (ref >60)
Glucose, Bld: 170 mg/dL — ABNORMAL HIGH (ref 70–99)
Potassium: 3.5 mmol/L (ref 3.5–5.1)
Sodium: 139 mmol/L (ref 135–145)
Total Bilirubin: 1.2 mg/dL (ref 0.3–1.2)
Total Protein: 9.4 g/dL — ABNORMAL HIGH (ref 6.5–8.1)

## 2021-06-05 LAB — CBC
HCT: 37.6 % (ref 36.0–46.0)
Hemoglobin: 12.5 g/dL (ref 12.0–15.0)
MCH: 27.7 pg (ref 26.0–34.0)
MCHC: 33.2 g/dL (ref 30.0–36.0)
MCV: 83.4 fL (ref 80.0–100.0)
Platelets: 141 10*3/uL — ABNORMAL LOW (ref 150–400)
RBC: 4.51 MIL/uL (ref 3.87–5.11)
RDW: 16 % — ABNORMAL HIGH (ref 11.5–15.5)
WBC: 5.5 10*3/uL (ref 4.0–10.5)
nRBC: 0 % (ref 0.0–0.2)

## 2021-06-05 LAB — ETHANOL: Alcohol, Ethyl (B): <10 mg/dL (ref <10)

## 2021-06-05 LAB — CBG MONITORING, ED: Glucose-Capillary: 153 mg/dL — ABNORMAL HIGH (ref 70–99)

## 2021-06-05 LAB — LIPASE, BLOOD: Lipase: 19 U/L (ref 11–51)

## 2021-06-05 LAB — MAGNESIUM: Magnesium: 1.6 mg/dL — ABNORMAL LOW (ref 1.7–2.4)

## 2021-06-05 MED ORDER — MAGNESIUM SULFATE 4 GM/100ML IV SOLN
4.0000 g | Freq: Once | INTRAVENOUS | Status: AC
  Administered 2021-06-05: 4 g via INTRAVENOUS
  Filled 2021-06-05: qty 100

## 2021-06-05 MED ORDER — THIAMINE HCL 100 MG PO TABS: 100.0000 mg | ORAL_TABLET | Freq: Every day | ORAL | Status: DC

## 2021-06-05 MED ORDER — LORAZEPAM 2 MG/ML IJ SOLN
0.0000 mg | Freq: Four times a day (QID) | INTRAMUSCULAR | Status: DC
Start: 2021-06-05 — End: 2021-06-05
  Administered 2021-06-05: 2 mg via INTRAVENOUS
  Filled 2021-06-05: qty 1

## 2021-06-05 MED ORDER — THIAMINE HCL 100 MG/ML IJ SOLN
100.0000 mg | Freq: Every day | INTRAMUSCULAR | Status: DC
  Administered 2021-06-05: 100 mg via INTRAVENOUS
  Filled 2021-06-05: qty 2

## 2021-06-05 MED ORDER — LACTATED RINGERS IV BOLUS: 1000.0000 mL | Freq: Once | INTRAVENOUS | Status: DC

## 2021-06-05 MED ORDER — POTASSIUM CHLORIDE 10 MEQ/100ML IV SOLN
10.0000 meq | INTRAVENOUS | Status: DC
  Administered 2021-06-05 (×2): 10 meq via INTRAVENOUS
  Filled 2021-06-05 (×3): qty 100

## 2021-06-05 MED ORDER — LACTATED RINGERS IV BOLUS
1000.0000 mL | Freq: Once | INTRAVENOUS | Status: AC
  Administered 2021-06-05: 1000 mL via INTRAVENOUS

## 2021-06-05 MED ORDER — ONDANSETRON 4 MG PO TBDP
4.0000 mg | ORAL_TABLET | Freq: Once | ORAL | Status: AC | PRN
  Administered 2021-06-05: 4 mg via ORAL
  Filled 2021-06-05: qty 1

## 2021-06-05 MED ORDER — METOCLOPRAMIDE HCL 5 MG/ML IJ SOLN
10.0000 mg | Freq: Once | INTRAMUSCULAR | Status: AC
  Administered 2021-06-05: 10 mg via INTRAVENOUS
  Filled 2021-06-05: qty 2

## 2021-06-05 MED ORDER — ONDANSETRON HCL 4 MG/2ML IJ SOLN
4.0000 mg | Freq: Once | INTRAMUSCULAR | Status: AC
  Administered 2021-06-05: 4 mg via INTRAVENOUS
  Filled 2021-06-05: qty 2

## 2021-06-05 MED ORDER — LORAZEPAM 2 MG/ML IJ SOLN: 0.0000 mg | Freq: Two times a day (BID) | INTRAMUSCULAR | Status: DC

## 2021-06-05 MED ORDER — LACTATED RINGERS IV BOLUS
2000.0000 mL | Freq: Once | INTRAVENOUS | Status: AC
  Administered 2021-06-05: 2000 mL via INTRAVENOUS

## 2021-06-05 MED ORDER — LORAZEPAM 1 MG PO TABS: 0.0000 mg | ORAL_TABLET | Freq: Two times a day (BID) | ORAL | Status: DC

## 2021-06-05 MED ORDER — LORAZEPAM 1 MG PO TABS: 0.0000 mg | ORAL_TABLET | Freq: Four times a day (QID) | ORAL | Status: DC

## 2021-06-05 MED ORDER — PANTOPRAZOLE SODIUM 40 MG IV SOLR
40.0000 mg | Freq: Once | INTRAVENOUS | Status: AC
  Administered 2021-06-05: 40 mg via INTRAVENOUS
  Filled 2021-06-05: qty 40

## 2021-06-05 NOTE — ED Notes (Signed)
Ambulatory back from b/r. Steady gait. Returned to IVFs, meds and monitor.

## 2021-06-05 NOTE — ED Triage Notes (Signed)
Pt c/o vomiting and not able to eat or drink anything since last night. Pt reports vomiting all day.

## 2021-06-05 NOTE — ED Provider Notes (Addendum)
[facility] Emergency Department Provider Note  ____________________________________________   Event Date/Time   First MD Initiated Contact with Patient 06/05/21 0957     (approximate)  I have reviewed the triage vital signs and the nursing notes.   HISTORY  Chief Complaint Emesis (Pt c/o vomiting and not able to eat or drink anything since last night. Pt reports vomiting all day. )   HPI Yvette [name] is a 40 y.o. female with a past medical history of anemia, asthma, alcohol abuse drinking approximately 6 glasses of liquor on a typical day with last drink reported on 11/23 with history of gastric ulcers and alcohol related gastritis who presents for assessment of 3 days of some nausea and vomiting.  Patient denies any fevers, chills, chest pain, cough, shortness of breath, abdominal pain, back pain, diarrhea, burning with urination but states she has not had a bowel movement a couple days.  She has no rash or extremity pain.  No recent falls or injuries.  She denies any illicit drug use.  Endorses tobacco abuse.  No other acute concerns at this time.         Past Medical History:  Diagnosis Date   Asthma    H/O 25 disease    as 70 month old child and at 19 years   History of anemia    History of blood transfusion 1984   Leukopenia    Neutropenia (HCC)     Patient Active Problem List   Diagnosis Date Noted   Alcoholic liver disease, unspecified (HCC) 10/26/2020   Alcohol withdrawal (HCC) 10/26/2020   Alcoholic ketoacidosis 10/26/2020   AKI (acute kidney injury) (HCC) 10/26/2020   High serum osmolar gap 10/26/2020   Alcoholic cirrhosis of liver without ascites (HCC)    Hiatal hernia    Portal hypertension (HCC)    Nausea & vomiting 11/10/2019   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   Intractable vomiting 10/01/2019   Alcohol use, daily 09/30/2019   Elevated LFTs 09/30/2019   Thrombocytopenia (HCC) 09/30/2019   Hypokalemia    Intractable  vomiting with nausea 08/23/2019   Dehydration    Food poisoning    Other neutropenia (HCC) 06/11/2016   Leukopenia 02/03/2015    Past Surgical History:  Procedure Laterality Date   ESOPHAGOGASTRODUODENOSCOPY N/A 10/29/2020   Procedure: ESOPHAGOGASTRODUODENOSCOPY (EGD);  Surgeon: Wyline Mood, MD;  Location: [facility] ENDOSCOPY;  Service: Gastroenterology;  [name];   ESOPHAGOGASTRODUODENOSCOPY (EGD) WITH PROPOFOL N/A 03/14/2020   Procedure: ESOPHAGOGASTRODUODENOSCOPY (EGD) WITH PROPOFOL;  Surgeon: Pasty Spillers, MD;  [name];  Service: Endoscopy;  [name];   ESOPHAGOGASTRODUODENOSCOPY (EGD) WITH PROPOFOL N/A 02/25/2021   Procedure: ESOPHAGOGASTRODUODENOSCOPY (EGD) WITH PROPOFOL;  Surgeon: Pasty Spillers, MD;  [name];  Service: Endoscopy;  [name];    Prior to Admission medications   Medication Sig Start Date End Date Taking? Authorizing Provider  FLUoxetine (PROZAC) 40 MG capsule Take 40 mg by mouth daily. Patient not taking: Reported on 02/25/2021    [provider]  mirtazapine (REMERON) 30 MG tablet Take 30 mg by mouth at bedtime. Patient not taking: Reported on 02/25/2021    [provider]  pantoprazole (PROTONIX) 40 MG tablet Take 1 tablet (40 mg total) by mouth 2 (two) times daily. 10/31/20 02/25/21  Charise Killian, MD  promethazine (PHENERGAN) 12.5 MG tablet Take 1 tablet (12.5 mg total) by mouth every 8 (eight) hours as needed for up to 7 days for nausea or vomiting. 02/23/21 03/02/21  Menshew, Dannielle Karvonen, PA-C  promethazine (PHENERGAN) 25 MG suppository Place 1 suppository (25 mg total) rectally every 8 (eight) hours as needed for nausea or vomiting. 02/23/21   Menshew, Dannielle Karvonen, PA-C    Allergies Patient has no known allergies.  Family History  Problem Relation Age of Onset   Lupus Mother    Cancer Paternal Aunt        Breast    Cancer Paternal Aunt        Breast     Social  History Social History   Tobacco Use   Smoking status: Every Day    Packs/day: 0.20    Years: 15.00    Pack years: 3.00    Types: Cigarettes   Smokeless tobacco: Never  Vaping Use   Vaping Use: Never used  Substance Use Topics   Alcohol use: Yes    Alcohol/week: 61.0 standard drinks    Types: 21 Cans of beer, [name]   Drug use: No    Review of Systems  Review of Systems  Constitutional:  Negative for chills and fever.  HENT:  Negative for sore throat.   Eyes:  Negative for pain.  Respiratory:  Negative for cough and stridor.   Cardiovascular:  Negative for chest pain.  Gastrointestinal:  Positive for nausea and vomiting.  Genitourinary:  Negative for dysuria.  Musculoskeletal:  Negative for myalgias.  Skin:  Negative for rash.  Neurological:  Negative for seizures, loss of consciousness and headaches.  Psychiatric/Behavioral:  Positive for substance abuse. Negative for suicidal ideas.   All other systems reviewed and are negative.    ____________________________________________   PHYSICAL EXAM:  VITAL SIGNS: ED Triage Vitals  Enc Vitals Group     BP 06/05/21 0223 (!) 140/106     Pulse Rate 06/05/21 0223 78     Resp 06/05/21 0223 17     Temp 06/05/21 0223 98.7 F (37.1 C)     Temp Source 06/05/21 0223 Oral     SpO2 06/05/21 0223 97 %     Weight 06/05/21 0225 105 lb (47.6 kg)     Height 06/05/21 0225 $RemoveBefor'5\' 1"'[id number]$  (1.549 m)     Head Circumference --      Peak Flow --      Pain Score 06/05/21 0225 0     Pain Loc --      Pain Edu? --      Excl. in [place]? --    Vitals:   06/05/21 1230 06/05/21 1300  BP: 117/86 124/85  Pulse:    Resp:    Temp:    SpO2:     Physical Exam Vitals and nursing note reviewed.  Constitutional:      General: She is not in acute distress.    Appearance: She is well-developed.  HENT:     Head: Normocephalic and atraumatic.     Right Ear: External ear normal.     Left Ear: External  ear normal.     Nose: Nose normal.  Eyes:     Conjunctiva/sclera: Conjunctivae normal.  Cardiovascular:     Rate and Rhythm: Normal rate and regular rhythm.     Heart sounds: No murmur heard. Pulmonary:     Effort: Pulmonary effort is normal. No respiratory distress.     Breath sounds: Normal breath sounds.  Abdominal:     Palpations: Abdomen is soft.     Tenderness: There is no abdominal tenderness.  Musculoskeletal:        General: No swelling.     Cervical back: Neck supple.  Skin:    General: Skin is warm and dry.     Capillary Refill: Capillary refill takes less than 2 seconds.  Neurological:     Mental Status: She is alert.  Psychiatric:        Mood and Affect: Mood normal.     ____________________________________________   LABS (all labs ordered are listed, but only abnormal results are displayed)  Labs Reviewed  COMPREHENSIVE METABOLIC PANEL - Abnormal; Notable for the following components:      Result Value   CO2 21 (*)    Glucose, Bld 170 (*)    Total Protein 9.4 (*)    AST 308 (*)    ALT 110 (*)    Alkaline Phosphatase 127 (*)    Anion gap 17 (*)    All other components within normal limits  CBC - Abnormal; Notable for the following components:   RDW 16.0 (*)    Platelets 141 (*)    All other components within normal limits  MAGNESIUM - Abnormal; Notable for the following components:   Magnesium 1.6 (*)    All other components within normal limits  CBG MONITORING, ED - Abnormal; Notable for the following components:   Glucose-Capillary 153 (*)    All other components within normal limits  LIPASE, BLOOD  ETHANOL  PROTIME-INR  URINALYSIS, ROUTINE W REFLEX MICROSCOPIC  HEPATITIS PANEL, ACUTE  POC URINE PREG, ED   ____________________________________________  EKG  ECG remarkable for sinus rhythm with a ventricular rate of 65, without evidence of acute ischemia and a QTc interval of 559.  Otherwise unremarkable  intervals ____________________________________________  RADIOLOGY  ED MD interpretation: Right upper quadrant ultrasound shows some fatty infiltration of the liver without evidence of cholecystitis or dilation of the common bile duct or other acute process.  Official radiology report(s): US ABDOMEN LIMITED RUQ (LIVER/GB)  Result Date: 06/05/2021 CLINICAL DATA:  Elevated LFTs EXAM: ULTRASOUND ABDOMEN LIMITED RIGHT UPPER QUADRANT COMPARISON:  Abdominal ultrasound 09/29/2019 FINDINGS: Gallbladder: No gallstones or wall thickening visualized. No sonographic Murphy sign noted by sonographer. Common bile duct: Diameter: 2 mm Liver: No focal lesion identified. Parenchymal echogenicity is increased consistent with mild fatty infiltration. Portal vein is patent on color Doppler imaging with normal direction of blood flow towards the liver. Other: None. IMPRESSION: Mild fatty infiltration of the liver, otherwise unremarkable right upper quadrant ultrasound. Electronically Signed   By: Valetta Mole M.D.   On: 06/05/2021 07:16    ____________________________________________   PROCEDURES  Procedure(s) performed (including Critical Care):  .1-3 Lead EKG Interpretation Performed by: Lucrezia Starch, MD Authorized by: Lucrezia Starch, MD     Interpretation: non-specific     ECG rate assessment: normal     Rhythm: sinus rhythm     Ectopy: none     Conduction: abnormal     ____________________________________________   INITIAL IMPRESSION / ASSESSMENT AND PLAN / ED COURSE      Patient presents with above-stated history and exam for assessment of some nausea and vomiting the last 3 days.  She denies any associated chest pain or abdominal pain, diarrhea, urinary symptoms, back pain states she stopped drinking when her stroke symptoms began.  No bladder bilious emesis.  No other clear associated sick symptoms.  On arrival she is afebrile and hemodynamically stable.  She does appear mildly  dehydrated but is awake and alert and ambulating around  the emergency room without difficulty.  Her abdomen is soft nontender throughout.  Suspect acute gastritis possibly exacerbated by ongoing EtOH abuse precipitated onset of symptoms.  There was occurring up to the onset of symptoms.  Lipase in the absence of epigastric tenderness on exam not consistent with acute pancreatitis.  Right upper quadrant ultrasound shows some fatty infiltration of the liver without evidence of cholecystitis or dilation of the common bile duct or other acute process.  CMP remarkable for mild hepatitis with AST of 308 and ALT of 110 with alk phos of 127.  I suspect this is related to alcohol abuse.  Her anion gap is 17 but her bicarb is 21 and I suspect this represents some mild starvation ketosis.  No other significant electrolyte or metabolic derangements.  CBC has no evidence of a leukocytosis or acute anemia.  Ethanol is undetectable.  INR is 3.4.  Magnesium is 1.6.  This was repleted.  Patient states she is feeling much better on my reassessment.  Care patient signed over to assuming provider approximately 1:35 PM with plan to follow-up UA and pregnancy test.  Patient is able tolerate p.o. I think she can likely safely be discharged home.  I did counsel patient extensively on dangers of ongoing alcohol abuse and likely damage to her liver.      ____________________________________________   FINAL CLINICAL IMPRESSION(S) / ED DIAGNOSES  Final diagnoses:  Elevated liver function tests  Nausea and vomiting, unspecified vomiting type  Hypomagnesemia  QT prolongation  Alcoholic hepatitis, unspecified whether ascites present    Medications  LORazepam (ATIVAN) injection 0-4 mg (2 mg Intravenous Given 06/05/21 1144)    Or  LORazepam (ATIVAN) tablet 0-4 mg ( Oral See Alternative 06/05/21 1144)  LORazepam (ATIVAN) injection 0-4 mg (has no administration in time range)    Or  LORazepam (ATIVAN) tablet 0-4 mg  (has no administration in time range)  thiamine tablet 100 mg ( Oral See Alternative 06/05/21 1115)    Or  thiamine (B-1) injection 100 mg (100 mg Intravenous Given 06/05/21 1115)  potassium chloride 10 mEq in 100 mL IVPB (10 mEq Intravenous New Bag/Given 06/05/21 1305)  magnesium sulfate IVPB 4 g 100 mL (4 g Intravenous New Bag/Given 06/05/21 1304)  ondansetron (ZOFRAN-ODT) disintegrating tablet 4 mg (4 mg Oral Given 06/05/21 0231)  metoCLOPramide (REGLAN) injection 10 mg (10 mg Intravenous Given 06/05/21 1115)  pantoprazole (PROTONIX) injection 40 mg (40 mg Intravenous Given 06/05/21 1114)  lactated ringers bolus 2,000 mL (0 mLs Intravenous Stopped 06/05/21 1248)  ondansetron (ZOFRAN) injection 4 mg (4 mg Intravenous Given 06/05/21 1144)  lactated ringers bolus 1,000 mL (1,000 mLs Intravenous New Bag/Given 06/05/21 1212)     ED Discharge Orders     None        Note:  This document was prepared using Dragon voice recognition software and may include unintentional dictation errors.    Lucrezia Starch, MD 06/05/21 1407    Lucrezia Starch, MD 06/05/21 [phone]

## 2021-06-09 ENCOUNTER — Ambulatory Visit (INDEPENDENT_AMBULATORY_CARE_PROVIDER_SITE_OTHER): Payer: Medicaid Other | Admitting: Gastroenterology

## 2021-06-09 VITALS — BP 116/80 | HR 96 | Temp 98.4°F | Wt 105.0 lb

## 2021-06-09 DIAGNOSIS — R748 Abnormal levels of other serum enzymes: Secondary | ICD-10-CM

## 2021-06-09 NOTE — Progress Notes (Signed)
Yvette Bouillon, MD [address]  [address]  [place], [place] [address]  Main: [phone]  Fax: [phone]   Primary Care Physician: [facility]   Chief complaint: Elevated liver enzymes  HPI: Yvette [name] is a 40 y.o. female here for follow-up of elevated liver enzymes due to ongoing alcohol use.  Patient recently went to the ER for nausea and was found to have elevated liver enzymes due to ongoing alcohol use.  Patient has not had any further symptoms since then.  States that she is trying to decrease her alcohol intake and is not drinking liquor anymore.  However, does report drinking 40 ounce of beer a day but states that this is less than what she was drinking before.  She states her eventual goal is to completely cut down alcohol.  When I asked about resources that she may have utilized or wants to utilize to help her, she states she would like to do it on her own.  No confusion.  No episodes of bleeding.  No dysphagia.  No altered bowel habits.  Recent EGD was completed for follow-up of previously seen esophagitis and showed a 4 cm hiatal hernia, esophageal mucosal changes.  Otherwise normal study.  Pathology was negative for H. pylori on gastric biopsies.  Esophageal biopsies were unremarkable.  Referral was placed to hematology previously for neutropenia.  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 and at 76 years   History of anemia    History of blood transfusion 1984   Leukopenia    Neutropenia (HCC)     Past Surgical History:  Procedure Laterality Date   ESOPHAGOGASTRODUODENOSCOPY N/A 10/29/2020   Procedure: ESOPHAGOGASTRODUODENOSCOPY (EGD);  Surgeon: Wyline Mood, MD;  Location: [facility] ENDOSCOPY;  Service: Gastroenterology;   [name];   ESOPHAGOGASTRODUODENOSCOPY (EGD) WITH PROPOFOL N/A 03/14/2020   Procedure: ESOPHAGOGASTRODUODENOSCOPY (EGD) WITH PROPOFOL;  Surgeon: Pasty Spillers, MD;  [name];  Service: Endoscopy;  [name];   ESOPHAGOGASTRODUODENOSCOPY (EGD) WITH PROPOFOL N/A 02/25/2021   Procedure: ESOPHAGOGASTRODUODENOSCOPY (EGD) WITH PROPOFOL;  Surgeon: Pasty Spillers, MD;  [name];  Service: Endoscopy;  [name];    Prior to Admission medications   Medication Sig Start Date End Date Taking? Authorizing Provider  promethazine (PHENERGAN) 25 MG suppository Place 1 suppository (25 mg total) rectally every 8 (eight) hours as needed for nausea or vomiting. 02/23/21  Yes Menshew, Charlesetta Ivory, PA-C  pantoprazole (PROTONIX) 40 MG tablet Take 1 tablet (40 mg total) by mouth 2 (two) times daily. 10/31/20 02/25/21  Charise Killian, MD  promethazine (PHENERGAN) 12.5 MG tablet Take 1 tablet (12.5 mg total) by mouth every 8 (eight) hours as needed for up to 7 days for nausea or vomiting. 02/23/21 03/02/21  Menshew, Charlesetta Ivory, PA-C    Family History  Problem Relation Age of Onset   Lupus Mother    Cancer Paternal Aunt        Breast    Cancer Paternal Aunt        Breast      Social History   Tobacco Use   Smoking status: Every Day    Packs/day: 0.20    Years: 15.00    Pack  years: 3.00    Types: Cigarettes   Smokeless tobacco: Never  Vaping Use   Vaping Use: Never used  Substance Use Topics   Alcohol use: Yes    Alcohol/week: 61.0 standard drinks    Types: 21 Cans of beer, [name]   Drug use: No    Allergies as of 06/09/2021   (No Known Allergies)    Physical Examination:  Constitutional: General:   Alert,  Well-developed, well-nourished, pleasant and cooperative in NAD BP 116/80   Pulse 96   Temp 98.4 F (36.9 C) (Oral)   Wt 105 lb (47.6 kg)   BMI 19.84 kg/m    Respiratory: Normal respiratory effort  Gastrointestinal:  Soft, non-tender and non-distended without masses, hepatosplenomegaly or hernias noted.  No guarding or rebound tenderness.     Cardiac: No clubbing or edema.  No cyanosis. Normal posterior tibial pedal pulses noted.  Psych:  Alert and cooperative. Normal mood and affect.  Musculoskeletal:  Normal gait. Head normocephalic, atraumatic. Symmetrical without gross deformities. 5/5 Lower extremity strength bilaterally.  Skin: Warm. Intact without significant lesions or rashes. No jaundice.  Neck: Supple, trachea midline  Lymph: No cervical lymphadenopathy  Psych:  Alert and oriented x3, Alert and cooperative. Normal mood and affect.  Labs: CMP     Component Value Date/Time   NA 139 06/05/2021 0232   NA 141 02/05/2021 1334   K 3.5 06/05/2021 0232   CL 101 06/05/2021 0232   CO2 21 (L) 06/05/2021 0232   GLUCOSE 170 (H) 06/05/2021 0232   BUN 11 06/05/2021 0232   BUN 7 02/05/2021 1334   CREATININE 0.84 06/05/2021 0232   CALCIUM 9.6 06/05/2021 0232   PROT 9.4 (H) 06/05/2021 0232   PROT 8.0 02/05/2021 1334   ALBUMIN 4.8 06/05/2021 0232   ALBUMIN 4.9 (H) 02/05/2021 1334   AST 308 (H) 06/05/2021 0232   ALT 110 (H) 06/05/2021 0232   ALKPHOS 127 (H) 06/05/2021 0232   BILITOT 1.2 06/05/2021 0232   BILITOT 0.2 02/05/2021 1334   GFRNONAA >60 06/05/2021 0232   GFRAA >60 11/15/2019 2054   Lab Results  Component Value Date   WBC 5.5 06/05/2021   HGB 12.5 06/05/2021   HCT 37.6 06/05/2021   MCV 83.4 06/05/2021   PLT 141 (L) 06/05/2021    Imaging Studies:   Assessment and Plan:   [name] is a 40 y.o. y/o female here for follow-up of elevated liver enzymes  Liver enzymes significantly elevated on ER visit recently This is due to ongoing alcohol use Patient again advised to continue to decrease and abstain from alcohol use Encouraged to seek resources such as alcoholics anonymous or other resources.  Patient  insist that she would like to continue to do it herself and does not need any resources to help her  We will repeat liver enzymes at this time  Risks of increased morbidity and mortality with ongoing alcohol use discussed with her in detail   Dr Yvette [name]

## 2021-06-27 ENCOUNTER — Emergency Department
Admission: EM | Admit: 2021-06-27 | Discharge: 2021-06-28 | Disposition: A | Discharge status: Home / Self Care | Payer: Medicaid Other | Attending: Emergency Medicine | Admitting: Emergency Medicine

## 2021-06-27 ENCOUNTER — Other Ambulatory Visit: Payer: Self-pay

## 2021-06-27 ENCOUNTER — Emergency Department: Payer: Medicaid Other

## 2021-06-27 ENCOUNTER — Encounter: Payer: Self-pay | Admitting: Emergency Medicine

## 2021-06-27 DIAGNOSIS — Z79899 Other long term (current) drug therapy: Secondary | ICD-10-CM | POA: Diagnosis not present

## 2021-06-27 DIAGNOSIS — K219 Gastro-esophageal reflux disease without esophagitis: Secondary | ICD-10-CM | POA: Diagnosis not present

## 2021-06-27 DIAGNOSIS — Z20822 Contact with and (suspected) exposure to covid-19: Secondary | ICD-10-CM | POA: Diagnosis not present

## 2021-06-27 DIAGNOSIS — F17210 Nicotine dependence, cigarettes, uncomplicated: Secondary | ICD-10-CM | POA: Insufficient documentation

## 2021-06-27 DIAGNOSIS — R112 Nausea with vomiting, unspecified: Secondary | ICD-10-CM | POA: Diagnosis not present

## 2021-06-27 DIAGNOSIS — R109 Unspecified abdominal pain: Secondary | ICD-10-CM | POA: Diagnosis not present

## 2021-06-27 DIAGNOSIS — J45909 Unspecified asthma, uncomplicated: Secondary | ICD-10-CM | POA: Insufficient documentation

## 2021-06-27 LAB — COMPREHENSIVE METABOLIC PANEL
ALT: 40 U/L (ref 0–44)
AST: 89 U/L — ABNORMAL HIGH (ref 15–41)
Albumin: 4.9 g/dL (ref 3.5–5.0)
Alkaline Phosphatase: 81 U/L (ref 38–126)
Anion gap: 18 — ABNORMAL HIGH (ref 5–15)
BUN: 6 mg/dL (ref 6–20)
CO2: 20 mmol/L — ABNORMAL LOW (ref 22–32)
Calcium: 9.4 mg/dL (ref 8.9–10.3)
Chloride: 102 mmol/L (ref 98–111)
Creatinine, Ser: 0.52 mg/dL (ref 0.44–1.00)
GFR, Estimated: >60 mL/min (ref >60)
Glucose, Bld: 99 mg/dL (ref 70–99)
Potassium: 3.7 mmol/L (ref 3.5–5.1)
Sodium: 140 mmol/L (ref 135–145)
Total Bilirubin: 0.8 mg/dL (ref 0.3–1.2)
Total Protein: 9 g/dL — ABNORMAL HIGH (ref 6.5–8.1)

## 2021-06-27 LAB — CBC
HCT: 36.3 % (ref 36.0–46.0)
Hemoglobin: 12.1 g/dL (ref 12.0–15.0)
MCH: 27.9 pg (ref 26.0–34.0)
MCHC: 33.3 g/dL (ref 30.0–36.0)
MCV: 83.8 fL (ref 80.0–100.0)
Platelets: 137 10*3/uL — ABNORMAL LOW (ref 150–400)
RBC: 4.33 MIL/uL (ref 3.87–5.11)
RDW: 17.1 % — ABNORMAL HIGH (ref 11.5–15.5)
WBC: 7.1 10*3/uL (ref 4.0–10.5)
nRBC: 0 % (ref 0.0–0.2)

## 2021-06-27 LAB — URINALYSIS, ROUTINE W REFLEX MICROSCOPIC
Bilirubin Urine: NEGATIVE
Glucose, UA: NEGATIVE mg/dL
Ketones, ur: 80 mg/dL — AB
Leukocytes,Ua: NEGATIVE
Nitrite: NEGATIVE
Protein, ur: >=300 mg/dL — AB
Specific Gravity, Urine: 1.023 (ref 1.005–1.030)
pH: 5 (ref 5.0–8.0)

## 2021-06-27 LAB — LIPASE, BLOOD: Lipase: 20 U/L (ref 11–51)

## 2021-06-27 LAB — HCG, QUANTITATIVE, PREGNANCY: hCG, Beta Chain, Quant, S: <1 m[IU]/mL (ref <5)

## 2021-06-27 MED ORDER — DEXTROSE 5 % IN LACTATED RINGERS IV BOLUS
1000.0000 mL | Freq: Once | INTRAVENOUS | Status: AC
  Administered 2021-06-27: 1000 mL via INTRAVENOUS
  Filled 2021-06-27: qty 1000

## 2021-06-27 MED ORDER — IOHEXOL 300 MG/ML  SOLN
80.0000 mL | Freq: Once | INTRAMUSCULAR | Status: AC | PRN
  Administered 2021-06-27: 80 mL via INTRAVENOUS

## 2021-06-27 MED ORDER — SODIUM CHLORIDE 0.9 % IV BOLUS
500.0000 mL | Freq: Once | INTRAVENOUS | Status: AC
  Administered 2021-06-27: 500 mL via INTRAVENOUS

## 2021-06-27 MED ORDER — ONDANSETRON 4 MG PO TBDP
4.0000 mg | ORAL_TABLET | Freq: Once | ORAL | Status: AC | PRN
  Administered 2021-06-27: 4 mg via ORAL
  Filled 2021-06-27: qty 1

## 2021-06-27 MED ORDER — SUCRALFATE 1 G PO TABS
1.0000 g | ORAL_TABLET | Freq: Once | ORAL | Status: AC
  Administered 2021-06-27: 1 g via ORAL
  Filled 2021-06-27: qty 1

## 2021-06-27 MED ORDER — PANTOPRAZOLE SODIUM 40 MG IV SOLR
40.0000 mg | Freq: Once | INTRAVENOUS | Status: AC
  Administered 2021-06-27: 40 mg via INTRAVENOUS
  Filled 2021-06-27: qty 40

## 2021-06-27 MED ORDER — METOCLOPRAMIDE HCL 5 MG/ML IJ SOLN
10.0000 mg | Freq: Once | INTRAMUSCULAR | Status: AC
  Administered 2021-06-27: 10 mg via INTRAVENOUS
  Filled 2021-06-27: qty 2

## 2021-06-27 MED ORDER — DROPERIDOL 2.5 MG/ML IJ SOLN
2.5000 mg | Freq: Once | INTRAMUSCULAR | Status: AC
  Administered 2021-06-27: 2.5 mg via INTRAVENOUS
  Filled 2021-06-27: qty 2

## 2021-06-27 MED ORDER — SODIUM CHLORIDE 0.9 % IV SOLN
12.5000 mg | Freq: Four times a day (QID) | INTRAVENOUS | Status: DC | PRN
  Filled 2021-06-27: qty 0.5

## 2021-06-27 MED ORDER — SODIUM CHLORIDE 0.9 % IV BOLUS
1000.0000 mL | Freq: Once | INTRAVENOUS | Status: AC
  Administered 2021-06-27: 1000 mL via INTRAVENOUS

## 2021-06-27 NOTE — ED Triage Notes (Signed)
Pt in via EMS from home with c/o nausea. 142/86, HR 86, 100% RA, 4mg  zofran given

## 2021-06-27 NOTE — ED Provider Notes (Signed)
[facility] Emergency Department Provider Note    Event Date/Time   First MD Initiated Contact with Patient 06/27/21 1757     (approximate)  I have reviewed the triage vital signs and the nursing notes.   HISTORY  Chief Complaint Abdominal Pain and Hematemesis    HPI GERTRUE SCHOONMAKER is a 40 y.o. female with a history of asthma as well as GERD and alcohol abuse presents to the ER for evaluation of nausea vomiting and multiple episode vomiting now having streaks of blood in it.  Not any blood thinners.  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  Recent RUQ [place] without cirrhosis. She is not having any melena no hematochezia.  Does continue to drink alcohol did not take Protonix because she ran out of her prescription several days ago.  Emesis bag with non bilious, nonbloody vomiti.  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 and at 55 years   History of anemia    History of blood transfusion 1984   Leukopenia    Neutropenia (HCC)    Family History  Problem Relation Age of Onset   Lupus Mother    Cancer Paternal Aunt        Breast    Cancer Paternal Aunt        Breast    Past Surgical History:  Procedure Laterality Date   ESOPHAGOGASTRODUODENOSCOPY N/A 10/29/2020   Procedure: ESOPHAGOGASTRODUODENOSCOPY (EGD);  Surgeon: Wyline Mood, MD;  Location: [facility] ENDOSCOPY;  Service: Gastroenterology;  [name];   ESOPHAGOGASTRODUODENOSCOPY (EGD) WITH PROPOFOL N/A 03/14/2020   Procedure: ESOPHAGOGASTRODUODENOSCOPY (EGD) WITH PROPOFOL;  Surgeon: Pasty Spillers, MD;  [name];  Service: Endoscopy;  [name];   ESOPHAGOGASTRODUODENOSCOPY (EGD) WITH PROPOFOL N/A 02/25/2021   Procedure: ESOPHAGOGASTRODUODENOSCOPY (EGD) WITH PROPOFOL;  Surgeon: Pasty Spillers, MD;  [name];  Service: Endoscopy;  Laterality:  N/A;   Patient Active Problem List   Diagnosis Date Noted   Alcoholic liver disease, unspecified (HCC) 10/26/2020   Alcohol withdrawal (HCC) 10/26/2020   Alcoholic ketoacidosis 10/26/2020   AKI (acute kidney injury) (HCC) 10/26/2020   High serum osmolar gap 10/26/2020   Alcoholic cirrhosis of liver without ascites (HCC)    Hiatal hernia    Portal hypertension (HCC)    Nausea & vomiting 11/10/2019   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   Intractable vomiting 10/01/2019   Alcohol use, daily 09/30/2019   Elevated LFTs 09/30/2019   Thrombocytopenia (HCC) 09/30/2019   Hypokalemia    Intractable vomiting with nausea 08/23/2019   Dehydration    Food poisoning    Other neutropenia (HCC) 06/11/2016   Leukopenia 02/03/2015      Prior to Admission medications   Medication Sig Start Date End Date Taking? Authorizing Provider  pantoprazole (PROTONIX) 40 MG tablet Take 1 tablet (40 mg total) by mouth 2 (two) times daily. 10/31/20 02/25/21  Charise Killian, MD  promethazine (PHENERGAN) 12.5 MG tablet Take 1 tablet (12.5 mg total) by mouth every 8 (eight) hours as needed for up to 7 days for nausea or vomiting. 02/23/21 03/02/21  Menshew, Charlesetta Ivory, PA-C  promethazine (PHENERGAN) 25 MG suppository Place 1 suppository (25 mg total) rectally every 8 (eight) hours as needed for nausea or vomiting. 02/23/21   Menshew, Charlesetta Ivory, PA-C    Allergies Patient has no known  allergies.    Social History Social History   Tobacco Use   Smoking status: Every Day    Packs/day: 0.20    Years: 15.00    Pack years: 3.00    Types: Cigarettes   Smokeless tobacco: Never  Vaping Use   Vaping Use: Never used  Substance Use Topics   Alcohol use: Yes    Alcohol/week: 61.0 standard drinks    Types: 21 Cans of beer, [name]   Drug use: No    Review of Systems Patient denies headaches, rhinorrhea, blurry vision, numbness, shortness of breath,  chest pain, edema, cough, abdominal pain, nausea, vomiting, diarrhea, dysuria, fevers, rashes or hallucinations unless otherwise stated above in HPI. ____________________________________________   PHYSICAL EXAM:  VITAL SIGNS: Vitals:   06/27/21 2030 06/27/21 2130  BP: 133/79 120/72  Pulse: 100 92  Resp: 18 16  Temp:    SpO2: 100% 99%    Constitutional: Alert and oriented.  Eyes: Conjunctivae are normal.  Head: Atraumatic. Nose: No congestion/rhinnorhea. Mouth/Throat: Mucous membranes are moist.   Neck: No stridor. Painless ROM.  Cardiovascular: Normal rate, regular rhythm. Grossly normal heart sounds.  Good peripheral circulation. Respiratory: Normal respiratory effort.  No retractions. Lungs CTAB. Gastrointestinal: Soft and nontender in all four quadrants. No distention. No abdominal bruits. No CVA tenderness. Genitourinary:  Musculoskeletal: No lower extremity tenderness nor edema.  No joint effusions. Neurologic:  Normal speech and language. No gross focal neurologic deficits are appreciated. No facial droop Skin:  Skin is warm, dry and intact. No rash noted. Psychiatric: Mood and affect are normal. Speech and behavior are normal.  ____________________________________________   LABS (all labs ordered are listed, but only abnormal results are displayed)  Results for orders placed or performed during the hospital encounter of 06/27/21 (from the past 24 hour(s))  Lipase, blood     Status: None   Collection Time: 06/27/21 12:37 PM  Result Value Ref Range   Lipase 20 11 - 51 U/L  Comprehensive metabolic panel     Status: Abnormal   Collection Time: 06/27/21 12:37 PM  Result Value Ref Range   Sodium 140 135 - 145 mmol/L   Potassium 3.7 3.5 - 5.1 mmol/L   Chloride 102 98 - 111 mmol/L   CO2 20 (L) 22 - 32 mmol/L   Glucose, Bld 99 70 - 99 mg/dL   BUN 6 6 - 20 mg/dL   Creatinine, Ser 1.61 0.44 - 1.00 mg/dL   Calcium 9.4 8.9 - 09.6 mg/dL   Total Protein 9.0 (H) 6.5 - 8.1  g/dL   Albumin 4.9 3.5 - 5.0 g/dL   AST 89 (H) 15 - 41 U/L   ALT 40 0 - 44 U/L   Alkaline Phosphatase 81 38 - 126 U/L   Total Bilirubin 0.8 0.3 - 1.2 mg/dL   GFR, Estimated >04 >54 mL/min   Anion gap 18 (H) 5 - 15  CBC     Status: Abnormal   Collection Time: 06/27/21 12:37 PM  Result Value Ref Range   WBC 7.1 4.0 - 10.5 K/uL   RBC 4.33 3.87 - 5.11 MIL/uL   Hemoglobin 12.1 12.0 - 15.0 g/dL   HCT 09.8 11.9 - 14.7 %   MCV 83.8 80.0 - 100.0 fL   MCH 27.9 26.0 - 34.0 pg   MCHC 33.3 30.0 - 36.0 g/dL   RDW 82.9 (H) 56.2 - 13.0 %   Platelets 137 (L) 150 -  400 K/uL   nRBC 0.0 0.0 - 0.2 %  hCG, quantitative, pregnancy     Status: None   Collection Time: 06/27/21 12:37 PM  Result Value Ref Range   hCG, Beta Chain, Quant, S <1 <5 mIU/mL   ____________________________________________ ____________________________________________  RADIOLOGY   ____________________________________________   PROCEDURES  Procedure(s) performed:  Procedures    Critical Care performed: no ____________________________________________   INITIAL IMPRESSION / ASSESSMENT AND PLAN / ED COURSE  Pertinent labs & imaging results that were available during my care of the patient were reviewed by me and considered in my medical decision making (see chart for details).   DDX: gerd, reflux, esophagitis, enteritis, cyclic vomiting, appy, colitis, sbo  Jakelyne R Troia is a 40 y.o. who presents to the ED with presentation as described above.  Suspect gastritis.  Possible enteritis.  Her abdominal exam is soft and benign.  Hemodynamically stable.  Blood work otherwise reassuring.  Will give IV fluids as well as IV antiemetics.  Clinical Course as of 06/27/21 2233  Sat Jun 27, 2021  2003 Patient failed p.o. challenge after IV Reglan.  Will give IV droperidol.  She is denying any abdominal pain and repeat abdominal exam is benign therefore suspect there is low utility in CT imaging particular with absence of white  count, fever or pain. [PR]  2144 Patient feels much improved after droperidol though still complaining of some mild nausea wants trial of Phenergan before trying a p.o. challenge again. [PR]  2228 Patient vomiting again.  Will order CT imaging.  Patient will be signed out to oncoming physician pending CT imaging and reassessment.  [PR]    Clinical Course User Index [PR] Merlyn Lot, MD    The patient was evaluated in Emergency Department today for the symptoms described in the history of present illness. He/she was evaluated in the context of the global COVID-19 pandemic, which necessitated consideration that the patient might be at risk for infection with the SARS-CoV-2 virus that causes COVID-19. Institutional protocols and algorithms that pertain to the evaluation of patients at risk for COVID-19 are in a state of rapid change based on information released by regulatory bodies including the CDC and federal and state organizations. These policies and algorithms were followed during the patient's care in the ED.  As part of my medical decision making, I reviewed the following data within the [facility] notes reviewed and incorporated, Labs reviewed, notes from prior ED visits and [place] Controlled Substance Database   ____________________________________________   FINAL CLINICAL IMPRESSION(S) / ED DIAGNOSES  Final diagnoses:  Nausea and vomiting, unspecified vomiting type      NEW MEDICATIONS STARTED DURING THIS VISIT:  New Prescriptions   No medications on file     Note:  This document was prepared using Dragon voice recognition software and may include unintentional dictation errors.    Merlyn Lot, MD 06/27/21 2234

## 2021-06-27 NOTE — ED Provider Notes (Signed)
Emergency Medicine Provider Triage Evaluation Note  Yvette [name] , a 40 y.o. female  was evaluated in triage.  Pt complains of vomiting pinkish looking liquid which started at 2am.  Was drinking fluids but now vomiting everything.   History of GERD.  Normally drinks 2 beers a day.  Smoker+    Review of Systems  Positive: Abdominal pain.  Hx of esophageal ulcers Negative: No known melania   Physical Exam  There were no vitals taken for this visit. Gen:   Awake, no distress   Resp:  Normal effort  MSK:   Moves extremities without difficulty  Other:  Moderate tenderness mid epigastric area.    Medical Decision Making  Medically screening exam initiated at 12:32 PM.  Appropriate orders placed.  PATRICE MATTHEW was informed that the remainder of the evaluation will be completed by another provider, this initial triage assessment does not replace that evaluation, and the importance of remaining in the ED until their evaluation is complete.     Tommi Rumps, PA-C 06/27/21 1239    Gilles Chiquito, MD 06/27/21 [phone]

## 2021-06-27 NOTE — ED Triage Notes (Signed)
Pt comes into the ED via [facility] c/o nausea, hematemesis, and abd pain.  Pt states she started vomiting blood this morning at 2:00.  Pt admits to having esophageal varices and consuming alcohol on a daily basis (2 beers/day).  Pt states she normally takes GERD medicine, but she ran out last week.

## 2021-06-28 ENCOUNTER — Other Ambulatory Visit: Payer: Self-pay

## 2021-06-28 ENCOUNTER — Encounter: Payer: Self-pay | Admitting: Emergency Medicine

## 2021-06-28 ENCOUNTER — Inpatient Hospital Stay
Admission: EM | Admit: 2021-06-28 | Discharge: 2021-07-01 | DRG: 391 | Disposition: A | Discharge status: Home / Self Care | Payer: Medicaid Other | Attending: Internal Medicine | Admitting: Internal Medicine

## 2021-06-28 DIAGNOSIS — I472 Ventricular tachycardia, unspecified: Secondary | ICD-10-CM | POA: Diagnosis present

## 2021-06-28 DIAGNOSIS — K920 Hematemesis: Secondary | ICD-10-CM | POA: Diagnosis not present

## 2021-06-28 DIAGNOSIS — R002 Palpitations: Secondary | ICD-10-CM | POA: Diagnosis present

## 2021-06-28 DIAGNOSIS — F419 Anxiety disorder, unspecified: Secondary | ICD-10-CM | POA: Diagnosis present

## 2021-06-28 DIAGNOSIS — M303 Mucocutaneous lymph node syndrome [Kawasaki]: Secondary | ICD-10-CM | POA: Diagnosis present

## 2021-06-28 DIAGNOSIS — F10188 Alcohol abuse with other alcohol-induced disorder: Secondary | ICD-10-CM | POA: Diagnosis present

## 2021-06-28 DIAGNOSIS — K2920 Alcoholic gastritis without bleeding: Secondary | ICD-10-CM | POA: Diagnosis present

## 2021-06-28 DIAGNOSIS — J45909 Unspecified asthma, uncomplicated: Secondary | ICD-10-CM | POA: Diagnosis present

## 2021-06-28 DIAGNOSIS — Z832 Family history of diseases of the blood and blood-forming organs and certain disorders involving the immune mechanism: Secondary | ICD-10-CM

## 2021-06-28 DIAGNOSIS — E876 Hypokalemia: Secondary | ICD-10-CM

## 2021-06-28 DIAGNOSIS — R112 Nausea with vomiting, unspecified: Principal | ICD-10-CM

## 2021-06-28 DIAGNOSIS — K219 Gastro-esophageal reflux disease without esophagitis: Secondary | ICD-10-CM | POA: Diagnosis present

## 2021-06-28 DIAGNOSIS — Z20822 Contact with and (suspected) exposure to covid-19: Secondary | ICD-10-CM | POA: Diagnosis present

## 2021-06-28 DIAGNOSIS — K2921 Alcoholic gastritis with bleeding: Secondary | ICD-10-CM | POA: Diagnosis present

## 2021-06-28 DIAGNOSIS — R55 Syncope and collapse: Secondary | ICD-10-CM | POA: Diagnosis present

## 2021-06-28 DIAGNOSIS — F1010 Alcohol abuse, uncomplicated: Secondary | ICD-10-CM | POA: Diagnosis present

## 2021-06-28 DIAGNOSIS — F17210 Nicotine dependence, cigarettes, uncomplicated: Secondary | ICD-10-CM | POA: Diagnosis present

## 2021-06-28 DIAGNOSIS — Z803 Family history of malignant neoplasm of breast: Secondary | ICD-10-CM

## 2021-06-28 DIAGNOSIS — K2211 Ulcer of esophagus with bleeding: Secondary | ICD-10-CM | POA: Diagnosis present

## 2021-06-28 DIAGNOSIS — F1020 Alcohol dependence, uncomplicated: Secondary | ICD-10-CM | POA: Diagnosis present

## 2021-06-28 DIAGNOSIS — I4729 Other ventricular tachycardia: Secondary | ICD-10-CM

## 2021-06-28 DIAGNOSIS — Z720 Tobacco use: Secondary | ICD-10-CM | POA: Diagnosis present

## 2021-06-28 HISTORY — DX: Tobacco use: Z72.0

## 2021-06-28 HISTORY — DX: Hypokalemia: E87.6

## 2021-06-28 HISTORY — DX: Alcohol abuse, uncomplicated: F10.10

## 2021-06-28 HISTORY — DX: Elevation of levels of liver transaminase levels: R74.01

## 2021-06-28 HISTORY — DX: Hypomagnesemia: E83.42

## 2021-06-28 HISTORY — DX: Other ventricular tachycardia: I47.29

## 2021-06-28 LAB — MAGNESIUM: Magnesium: 1.7 mg/dL (ref 1.7–2.4)

## 2021-06-28 LAB — BASIC METABOLIC PANEL
Anion gap: 10 (ref 5–15)
BUN: 6 mg/dL (ref 6–20)
CO2: 23 mmol/L (ref 22–32)
Calcium: 9 mg/dL (ref 8.9–10.3)
Chloride: 104 mmol/L (ref 98–111)
Creatinine, Ser: 0.54 mg/dL (ref 0.44–1.00)
GFR, Estimated: >60 mL/min (ref >60)
Glucose, Bld: 92 mg/dL (ref 70–99)
Potassium: 3.3 mmol/L — ABNORMAL LOW (ref 3.5–5.1)
Sodium: 137 mmol/L (ref 135–145)

## 2021-06-28 LAB — URINE DRUG SCREEN, QUALITATIVE (ARMC ONLY)
Amphetamines, Ur Screen: NOT DETECTED
Barbiturates, Ur Screen: NOT DETECTED
Benzodiazepine, Ur Scrn: NOT DETECTED
Cannabinoid 50 Ng, Ur ~~LOC~~: NOT DETECTED
Cocaine Metabolite,Ur ~~LOC~~: NOT DETECTED
MDMA (Ecstasy)Ur Screen: NOT DETECTED
Methadone Scn, Ur: NOT DETECTED
Opiate, Ur Screen: NOT DETECTED
Phencyclidine (PCP) Ur S: NOT DETECTED
Tricyclic, Ur Screen: NOT DETECTED

## 2021-06-28 LAB — RESP PANEL BY RT-PCR (FLU A&B, COVID) ARPGX2
Influenza A by PCR: NEGATIVE
Influenza A by PCR: NEGATIVE
Influenza B by PCR: NEGATIVE
Influenza B by PCR: NEGATIVE
SARS Coronavirus 2 by RT PCR: NEGATIVE
SARS Coronavirus 2 by RT PCR: NEGATIVE

## 2021-06-28 LAB — PHOSPHORUS: Phosphorus: 2.2 mg/dL — ABNORMAL LOW (ref 2.5–4.6)

## 2021-06-28 MED ORDER — SODIUM CHLORIDE 0.9 % IV SOLN
12.5000 mg | Freq: Three times a day (TID) | INTRAVENOUS | Status: DC | PRN
  Administered 2021-06-29 (×2): 12.5 mg via INTRAVENOUS
  Filled 2021-06-28: qty 12.5
  Filled 2021-06-28 (×2): qty 0.5

## 2021-06-28 MED ORDER — ADULT MULTIVITAMIN W/MINERALS CH
1.0000 | ORAL_TABLET | Freq: Every day | ORAL | Status: DC
  Administered 2021-06-28 – 2021-07-01 (×4): 1 via ORAL
  Filled 2021-06-28 (×4): qty 1

## 2021-06-28 MED ORDER — LACTATED RINGERS IV SOLN: INTRAVENOUS | Status: DC

## 2021-06-28 MED ORDER — ONDANSETRON HCL 4 MG/2ML IJ SOLN: 4.0000 mg | Freq: Three times a day (TID) | INTRAMUSCULAR | Status: DC | PRN

## 2021-06-28 MED ORDER — PANTOPRAZOLE SODIUM 40 MG IV SOLR
40.0000 mg | Freq: Once | INTRAVENOUS | Status: AC
  Administered 2021-06-28: 12:00:00 40 mg via INTRAVENOUS
  Filled 2021-06-28: qty 40

## 2021-06-28 MED ORDER — NICOTINE 21 MG/24HR TD PT24
21.0000 mg | MEDICATED_PATCH | Freq: Every day | TRANSDERMAL | Status: DC
  Filled 2021-06-28 (×2): qty 1

## 2021-06-28 MED ORDER — ONDANSETRON HCL 4 MG PO TABS: 4.0000 mg | ORAL_TABLET | Freq: Every day | ORAL | 1 refills | Status: DC | PRN

## 2021-06-28 MED ORDER — METOCLOPRAMIDE HCL 5 MG/ML IJ SOLN
5.0000 mg | Freq: Three times a day (TID) | INTRAMUSCULAR | Status: DC
  Administered 2021-06-28 – 2021-06-29 (×2): 5 mg via INTRAVENOUS
  Filled 2021-06-28 (×2): qty 2

## 2021-06-28 MED ORDER — THIAMINE HCL 100 MG/ML IJ SOLN
100.0000 mg | Freq: Every day | INTRAMUSCULAR | Status: DC
  Filled 2021-06-28 (×2): qty 2

## 2021-06-28 MED ORDER — PANTOPRAZOLE SODIUM 40 MG IV SOLR
40.0000 mg | Freq: Two times a day (BID) | INTRAVENOUS | Status: DC
  Administered 2021-06-28 – 2021-06-29 (×3): 40 mg via INTRAVENOUS
  Filled 2021-06-28 (×3): qty 40

## 2021-06-28 MED ORDER — LACTATED RINGERS IV BOLUS
1000.0000 mL | Freq: Once | INTRAVENOUS | Status: AC
  Administered 2021-06-28: 12:00:00 1000 mL via INTRAVENOUS

## 2021-06-28 MED ORDER — THIAMINE HCL 100 MG PO TABS
100.0000 mg | ORAL_TABLET | Freq: Every day | ORAL | Status: DC
  Administered 2021-06-28 – 2021-07-01 (×4): 100 mg via ORAL
  Filled 2021-06-28 (×4): qty 1

## 2021-06-28 MED ORDER — FOLIC ACID 1 MG PO TABS
1.0000 mg | ORAL_TABLET | Freq: Every day | ORAL | Status: DC
  Administered 2021-06-28 – 2021-07-01 (×4): 1 mg via ORAL
  Filled 2021-06-28 (×4): qty 1

## 2021-06-28 MED ORDER — MORPHINE SULFATE (PF) 2 MG/ML IV SOLN: 2.0000 mg | INTRAVENOUS | Status: DC | PRN

## 2021-06-28 MED ORDER — LORAZEPAM 1 MG PO TABS: 1.0000 mg | ORAL_TABLET | ORAL | Status: DC | PRN

## 2021-06-28 MED ORDER — ONDANSETRON HCL 4 MG/2ML IJ SOLN
4.0000 mg | Freq: Once | INTRAMUSCULAR | Status: AC
  Administered 2021-06-28: 14:00:00 4 mg via INTRAVENOUS
  Filled 2021-06-28: qty 2

## 2021-06-28 MED ORDER — METOCLOPRAMIDE HCL 5 MG/ML IJ SOLN
10.0000 mg | Freq: Once | INTRAMUSCULAR | Status: AC
  Administered 2021-06-28: 13:00:00 10 mg via INTRAVENOUS
  Filled 2021-06-28: qty 2

## 2021-06-28 MED ORDER — MIRTAZAPINE 15 MG PO TABS
15.0000 mg | ORAL_TABLET | Freq: Every day | ORAL | Status: DC
  Administered 2021-06-28 – 2021-06-29 (×2): 15 mg via ORAL
  Filled 2021-06-28 (×2): qty 1

## 2021-06-28 MED ORDER — LORAZEPAM 2 MG/ML IJ SOLN
0.0000 mg | Freq: Two times a day (BID) | INTRAMUSCULAR | Status: DC
Start: 2021-06-30 — End: 2021-06-30

## 2021-06-28 MED ORDER — MAGNESIUM SULFATE 2 GM/50ML IV SOLN
2.0000 g | Freq: Once | INTRAVENOUS | Status: AC
  Administered 2021-06-28: 13:00:00 2 g via INTRAVENOUS
  Filled 2021-06-28: qty 50

## 2021-06-28 MED ORDER — DROPERIDOL 2.5 MG/ML IJ SOLN
1.2500 mg | Freq: Once | INTRAMUSCULAR | Status: AC
  Administered 2021-06-28: 12:00:00 1.25 mg via INTRAVENOUS
  Filled 2021-06-28: qty 2

## 2021-06-28 MED ORDER — LORAZEPAM 2 MG/ML IJ SOLN: 1.0000 mg | INTRAMUSCULAR | Status: DC | PRN

## 2021-06-28 MED ORDER — PROMETHAZINE HCL 12.5 MG PO TABS
12.5000 mg | ORAL_TABLET | Freq: Three times a day (TID) | ORAL | 0 refills | Status: DC | PRN
Start: 2021-06-28 — End: 2021-06-28

## 2021-06-28 MED ORDER — POTASSIUM CHLORIDE 10 MEQ/100ML IV SOLN
10.0000 meq | INTRAVENOUS | Status: AC
  Administered 2021-06-28 (×4): 10 meq via INTRAVENOUS
  Filled 2021-06-28 (×4): qty 100

## 2021-06-28 MED ORDER — ALBUTEROL SULFATE HFA 108 (90 BASE) MCG/ACT IN AERS: 2.0000 | INHALATION_SPRAY | RESPIRATORY_TRACT | Status: DC | PRN

## 2021-06-28 MED ORDER — PROMETHAZINE HCL 25 MG RE SUPP: 25.0000 mg | Freq: Three times a day (TID) | RECTAL | 0 refills | Status: DC | PRN

## 2021-06-28 MED ORDER — LORAZEPAM 2 MG/ML IJ SOLN
0.0000 mg | Freq: Four times a day (QID) | INTRAMUSCULAR | Status: DC
  Administered 2021-06-28: 22:00:00 0 mg via INTRAVENOUS

## 2021-06-28 MED ORDER — ALBUTEROL SULFATE (2.5 MG/3ML) 0.083% IN NEBU: 2.5000 mg | INHALATION_SOLUTION | RESPIRATORY_TRACT | Status: DC | PRN

## 2021-06-28 NOTE — ED Notes (Signed)
Pt stating she feels nauseas again after drinking fluids

## 2021-06-28 NOTE — ED Notes (Signed)
ED tech will attempt blood draw at this time.

## 2021-06-28 NOTE — H&P (Addendum)
History and Physical    Yvette [name] [id number] DOB: Jan 03, 1981 DOA: 06/28/2021  Referring MD/NP/PA:   PCP: [facility]   Patient coming from:  The patient is coming from home.  At baseline, pt is independent for most of ADL.        Chief Complaint: Intractable nausea and vomiting  HPI: Yvette [name] is a 40 y.o. 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  Patient states that she has been having intractable nausea and vomiting for several days.  Initially she vomited little streaks of blood which has resolved.  Today no hematemesis.  Patient also had crampy abdominal pain earlier, which has resolved.  Currently denies abdominal pain.  No diarrhea.  No fever or chills.  Patient does not have chest pain, cough, shortness of breath.  Symptoms for UTI.  She states that she recently cutting down drinking alcohol.  She used to drink every day, 4 beers each time, currently she is drinking 2 beers each time. Patient was seen in ED yesterday.  Patient had unimpressive CT scan of abdomen/pelvis, normal lipase 20 and negative pregnancy test. Patient states that after she went home, she has continued to have severe nausea and vomiting.  Cannot keep anything down.  Patient had right upper quadrant ultrasound on 06/05/2021, which showed fatty liver without issues.  ED Course: pt was found to have WBC 7.1, hemoglobin 12.1 (12.5 on 06/05/2021), negative UDS, negative COVID PCR, GFR > 60, potassium 3.3, magnesium 1.7, temperature normal, blood pressure 174/101, 103/71, heart rate 100, 80, RR 20, oxygen saturation 95% on room air.  CT-abd/pelvis on 06/27/21 No acute abnormality to correspond with the patient's given clinical history.   Prominent periuterine veins bilaterally as described. These may represent some changes of pelvic congestion.   No  other focal abnormality is noted.  Review of Systems:   General: no fevers, chills, no body weight gain, has poor appetite, has fatigue HEENT: no blurry vision, hearing changes or sore throat Respiratory: no dyspnea, coughing, wheezing CV: no chest pain, no palpitations GI: has nausea, vomiting, abdominal pain, hematemesis, diarrhea, constipation GU: no dysuria, burning on urination, increased urinary frequency, hematuria  Ext: no leg edema Neuro: no unilateral weakness, numbness, or tingling, no vision change or hearing loss Skin: no rash, no skin tear. MSK: No muscle spasm, no deformity, no limitation of range of movement in spin Heme: No easy bruising.  Travel history: No recent long distant travel.  Allergy: No Known Allergies  Past Medical History:  Diagnosis Date   Asthma    H/O Kawasaki's disease    as 44 month old child and at 63 years   History of anemia    History of blood transfusion 1984   Leukopenia    Neutropenia (HCC)     Past Surgical History:  Procedure Laterality Date   ESOPHAGOGASTRODUODENOSCOPY N/A 10/29/2020   Procedure: ESOPHAGOGASTRODUODENOSCOPY (EGD);  Surgeon: Jonathon Bellows, MD;  Location: [facility] ENDOSCOPY;  Service: Gastroenterology;  [name];   ESOPHAGOGASTRODUODENOSCOPY (EGD) WITH PROPOFOL N/A 03/14/2020   Procedure: ESOPHAGOGASTRODUODENOSCOPY (EGD) WITH PROPOFOL;  Surgeon: Virgel Manifold, MD;  [name];  Service: Endoscopy;  [name];   ESOPHAGOGASTRODUODENOSCOPY (EGD) WITH PROPOFOL N/A 02/25/2021   Procedure: ESOPHAGOGASTRODUODENOSCOPY (EGD) WITH PROPOFOL;  Surgeon: Virgel Manifold, MD;  [name];  Service: Endoscopy;  [name];    Social History:  reports that  she has been smoking cigarettes. She has a 3.00 pack-year smoking history. She has never used smokeless tobacco. She reports current alcohol use of about 61.0 standard drinks per week. She reports that she does not use drugs.  Family  History:  Family History  Problem Relation Age of Onset   Lupus Mother    Cancer Paternal Aunt        Breast    Cancer Paternal Aunt        Breast      Prior to Admission medications   Medication Sig Start Date End Date Taking? Authorizing Provider  ondansetron (ZOFRAN) 4 MG tablet Take 1 tablet (4 mg total) by mouth daily as needed for nausea or vomiting. 06/28/21 06/28/22  Alfred Levins, [place], MD  pantoprazole (PROTONIX) 40 MG tablet Take 1 tablet (40 mg total) by mouth 2 (two) times daily. 10/31/20 02/25/21  Wyvonnia Dusky, MD    Physical Exam: Vitals:   06/28/21 1033 06/28/21 1230 06/28/21 1300 06/28/21 1330  BP:  (!) 164/97 103/71 (!) 137/105  Pulse:  66 80 89  Resp:  16 17 12   Temp: 98.1 F (36.7 C)     TempSrc: Oral     SpO2:  98% 97% 97%  Weight:      Height:       General: Not in acute distress.  Dry mucous membrane HEENT:       Eyes: PERRL, EOMI, no scleral icterus.       ENT: No discharge from the ears and nose, no pharynx injection, no tonsillar enlargement.        Neck: No JVD, no bruit, no mass felt. Heme: No neck lymph node enlargement. Cardiac: S1/S2, RRR, No murmurs, No gallops or rubs. Respiratory: No rales, wheezing, rhonchi or rubs. GI: Soft, nondistended, nontender, no rebound pain, no organomegaly, BS present. GU: No hematuria Ext: No pitting leg edema bilaterally. 1+DP/PT pulse bilaterally. Musculoskeletal: No joint deformities, No joint redness or warmth, no limitation of ROM in spin. Skin: No rashes.  Neuro: Alert, oriented X3, cranial nerves II-XII grossly intact, moves all extremities normally.  Psych: Patient is not psychotic, no suicidal or hemocidal ideation.  Labs on Admission: I have personally reviewed following labs and imaging studies  CBC: Recent Labs  Lab 06/27/21 1237  WBC 7.1  HGB 12.1  HCT 36.3  MCV 83.8  PLT 0000000*   Basic Metabolic Panel: Recent Labs  Lab 06/27/21 1237 06/28/21 1226  NA 140 137  K 3.7 3.3*  CL  102 104  CO2 20* 23  GLUCOSE 99 92  BUN 6 6  CREATININE 0.52 0.54  CALCIUM 9.4 9.0  MG  --  1.7   GFR: Estimated Creatinine Clearance: 71.6 mL/min (by C-G formula based on SCr of 0.54 mg/dL). Liver Function Tests: Recent Labs  Lab 06/27/21 1237  AST 89*  ALT 40  ALKPHOS 81  BILITOT 0.8  PROT 9.0*  ALBUMIN 4.9   Recent Labs  Lab 06/27/21 1237  LIPASE 20   No results for input(s): AMMONIA in the last 168 hours. Coagulation Profile: No results for input(s): INR, PROTIME in the last 168 hours. Cardiac Enzymes: No results for input(s): CKTOTAL, CKMB, CKMBINDEX, TROPONINI in the last 168 hours. BNP (last 3 results) No results for input(s): PROBNP in the last 8760 hours. HbA1C: No results for input(s): HGBA1C in the last 72 hours. CBG: No results for input(s): GLUCAP in the last 168 hours. Lipid Profile: No results for input(s): CHOL, HDL, LDLCALC, TRIG,  CHOLHDL, LDLDIRECT in the last 72 hours. Thyroid Function Tests: No results for input(s): TSH, T4TOTAL, FREET4, T3FREE, THYROIDAB in the last 72 hours. Anemia Panel: No results for input(s): VITAMINB12, FOLATE, FERRITIN, TIBC, IRON, RETICCTPCT in the last 72 hours. Urine analysis:    Component Value Date/Time   COLORURINE YELLOW (A) 06/27/2021 2331   APPEARANCEUR CLEAR (A) 06/27/2021 2331   LABSPEC 1.023 06/27/2021 2331   PHURINE 5.0 06/27/2021 2331   GLUCOSEU NEGATIVE 06/27/2021 2331   HGBUR MODERATE (A) 06/27/2021 2331   BILIRUBINUR NEGATIVE 06/27/2021 2331   KETONESUR 80 (A) 06/27/2021 2331   PROTEINUR >=300 (A) 06/27/2021 2331   NITRITE NEGATIVE 06/27/2021 2331   LEUKOCYTESUR NEGATIVE 06/27/2021 2331   Sepsis Labs: @LABRCNTIP (procalcitonin:4,lacticidven:4) ) Recent Results (from the past 240 hour(s))  Resp Panel by RT-PCR (Flu A&B, Covid) Nasopharyngeal Swab     Status: None   Collection Time: 06/27/21 11:22 PM   Specimen: Nasopharyngeal Swab; Nasopharyngeal(NP) swabs in vial transport medium  Result  Value Ref Range Status   SARS Coronavirus 2 by RT PCR NEGATIVE NEGATIVE Final    Comment: (NOTE) SARS-CoV-2 target nucleic acids are NOT DETECTED.  The SARS-CoV-2 RNA is generally detectable in upper respiratory specimens during the acute phase of infection. The lowest concentration of SARS-CoV-2 viral copies this assay can detect is 138 copies/mL. A negative result does not preclude SARS-Cov-2 infection and should not be used as the sole basis for treatment or other patient management decisions. A negative result may occur with  improper specimen collection/handling, submission of specimen other than nasopharyngeal swab, presence of viral mutation(s) within the areas targeted by this assay, and inadequate number of viral copies(<138 copies/mL). A negative result must be combined with clinical observations, patient history, and epidemiological information. The expected result is Negative.  Fact Sheet for Patients:  EntrepreneurPulse.com.au  Fact Sheet for Healthcare Providers:  IncredibleEmployment.be  This test is no t yet approved or cleared by the [place] FDA and  has been authorized for detection and/or diagnosis of SARS-CoV-2 by FDA under an Emergency Use Authorization (EUA). This EUA will remain  in effect (meaning this test can be used) for the duration of the COVID-19 declaration under Section 564(b)(1) of the Act, 21 U.S.C.section 360bbb-3(b)(1), unless the authorization is terminated  or revoked sooner.       Influenza A by PCR NEGATIVE NEGATIVE Final   Influenza B by PCR NEGATIVE NEGATIVE Final    Comment: (NOTE) The Xpert Xpress SARS-CoV-2/FLU/RSV plus assay is intended as an aid in the diagnosis of influenza from Nasopharyngeal swab specimens and should not be used as a sole basis for treatment. Nasal washings and aspirates are unacceptable for Xpert Xpress SARS-CoV-2/FLU/RSV testing.  Fact Sheet for  Patients: EntrepreneurPulse.com.au  Fact Sheet for Healthcare Providers: IncredibleEmployment.be  This test is not yet approved or cleared by the [place] FDA and has been authorized for detection and/or diagnosis of SARS-CoV-2 by FDA under an Emergency Use Authorization (EUA). This EUA will remain in effect (meaning this test can be used) for the duration of the COVID-19 declaration under Section 564(b)(1) of the Act, 21 U.S.C. section 360bbb-3(b)(1), unless the authorization is terminated or revoked.  Performed at [facility], [address] [place], [place] [address]   Resp Panel by RT-PCR (Flu A&B, Covid) Nasopharyngeal Swab     Status: None   Collection Time: 06/28/21  2:25 PM   Specimen: Nasopharyngeal Swab; Nasopharyngeal(NP) swabs in vial transport medium  Result Value Ref Range Status  SARS Coronavirus 2 by RT PCR NEGATIVE NEGATIVE Final    Comment: (NOTE) SARS-CoV-2 target nucleic acids are NOT DETECTED.  The SARS-CoV-2 RNA is generally detectable in upper respiratory specimens during the acute phase of infection. The lowest concentration of SARS-CoV-2 viral copies this assay can detect is 138 copies/mL. A negative result does not preclude SARS-Cov-2 infection and should not be used as the sole basis for treatment or other patient management decisions. A negative result may occur with  improper specimen collection/handling, submission of specimen other than nasopharyngeal swab, presence of viral mutation(s) within the areas targeted by this assay, and inadequate number of viral copies(<138 copies/mL). A negative result must be combined with clinical observations, patient history, and epidemiological information. The expected result is Negative.  Fact Sheet for Patients:  BloggerCourse.com  Fact Sheet for Healthcare Providers:  SeriousBroker.it  This test is no t  yet approved or cleared by the [place] FDA and  has been authorized for detection and/or diagnosis of SARS-CoV-2 by FDA under an Emergency Use Authorization (EUA). This EUA will remain  in effect (meaning this test can be used) for the duration of the COVID-19 declaration under Section 564(b)(1) of the Act, 21 U.S.C.section 360bbb-3(b)(1), unless the authorization is terminated  or revoked sooner.       Influenza A by PCR NEGATIVE NEGATIVE Final   Influenza B by PCR NEGATIVE NEGATIVE Final    Comment: (NOTE) The Xpert Xpress SARS-CoV-2/FLU/RSV plus assay is intended as an aid in the diagnosis of influenza from Nasopharyngeal swab specimens and should not be used as a sole basis for treatment. Nasal washings and aspirates are unacceptable for Xpert Xpress SARS-CoV-2/FLU/RSV testing.  Fact Sheet for Patients: BloggerCourse.com  Fact Sheet for Healthcare Providers: SeriousBroker.it  This test is not yet approved or cleared by the [place] FDA and has been authorized for detection and/or diagnosis of SARS-CoV-2 by FDA under an Emergency Use Authorization (EUA). This EUA will remain in effect (meaning this test can be used) for the duration of the COVID-19 declaration under Section 564(b)(1) of the Act, 21 U.S.C. section 360bbb-3(b)(1), unless the authorization is terminated or revoked.  Performed at [facility], [address] [place], [place] [address]      Radiological Exams on Admission: CT ABDOMEN PELVIS W CONTRAST  Result Date: 06/27/2021 CLINICAL DATA:  Nausea and vomiting EXAM: CT ABDOMEN AND PELVIS WITH CONTRAST TECHNIQUE: Multidetector CT imaging of the abdomen and pelvis was performed using the standard protocol following bolus administration of intravenous contrast. CONTRAST:  52mL OMNIPAQUE IOHEXOL 300 MG/ML  SOLN COMPARISON:  None. FINDINGS: Lower chest: No acute abnormality. Hepatobiliary: Mild  fatty infiltration of the liver is noted. The gallbladder appears within normal limits. Pancreas: Unremarkable. No pancreatic ductal dilatation or surrounding inflammatory changes. Spleen: Normal in size without focal abnormality. Adrenals/Urinary Tract: Adrenal glands are within normal limits. Kidneys demonstrate a normal enhancement pattern bilaterally. No renal calculi or obstructive changes are noted. The ureters are within normal limits bilaterally. The bladder is decompressed. Stomach/Bowel: Distal colon is decompressed. No obstructive changes are seen. The appendix is within normal limits. Small bowel appears within normal limits. Stomach shows a small sliding-type hiatal hernia. Vascular/Lymphatic: Abdominal aorta is within normal limits. Prominent ovarian vein is noted on the left with prominent periuterine veins. Similar prominent periuterine veins are noted on the right draining into the internal iliac vein on the right. No sizable lymphadenopathy is noted. Reproductive: Uterus is within normal limits. Prominent periuterine veins are seen.  No adnexal abnormality is seen. Other: No abdominal wall hernia or abnormality. No abdominopelvic ascites. Musculoskeletal: No acute or significant osseous findings. IMPRESSION: No acute abnormality to correspond with the patient's given clinical history. Prominent periuterine veins bilaterally as described. These may represent some changes of pelvic congestion. No other focal abnormality is noted. Electronically Signed   By: Inez Catalina M.D.   On: 06/27/2021 23:15     EKG: I have personally reviewed.  Sinus rhythm, QTC 471, right axis deviation, early R wave progression  Assessment/Plan Principal Problem:   Intractable nausea and vomiting Active Problems:   Hypokalemia   Alcoholic gastritis   GERD (gastroesophageal reflux disease)   Tobacco abuse   Alcohol abuse   Hematemesis   Intractable nausea and vomiting: CT scan abdomen/pelvis negative for acute  serious issues.  Lipase normal.  Possibly due to alcoholic gastritis.  -Placed on MedSurg bed for observation -Protonix 40 mg twice daily -IV fluid: 1 L LR, followed by 125 cc/h -Scheduled Reglan 5 mg 3 times daily -As needed Phenergan for nausea and vomiting (patient states that Zofran is not effective to her)  Hypokalemia: Potassium 3.3.  Magnesium 1.7 -10 mEq of IV potassium chloride x 4 -Give 2 g of magnesium sulfate -Check phosphorus level  Alcoholic gastritis -IV Protonix  GERD (gastroesophageal reflux disease) -On IV Protonix  Tobacco abuse and alcohol abuse: Patient states that she drinks less recently, but is still drinking 2 beers every day. -Did counseling about importance of quitting alcohol and tobacco -Nicotine patch -CIWA protocol  Hematemesis: Has resolved.  Hemoglobin stable 12.1 -On Protonix IV -Follow-up with CBC         DVT ppx: SCD Code Status: Full code Family Communication: not done, no family member is at bed side.   Disposition Plan:  Anticipate discharge back to previous environment Consults called:  none Admission status and Level of care: Med-Surg:   for obs   Status is: Observation  The patient remains OBS appropriate and will d/c before 2 midnights.          Date of Service 06/28/2021    [facility]   If 7PM-7AM, please contact night-coverage [email or web address] 06/28/2021, 4:25 PM

## 2021-06-28 NOTE — ED Notes (Signed)
Pt back to ED after being discharged last night after eval for same. States her ride came at 0700 this morning, was only gone for 2 hours, then brought self back for N/V. Has vomited 4 times since 0700 today, yellow-green bile. Cannot hold anything down. States before discharged last night, drank water. Has not been able to keep these down. Last time had solid food was 2 days ago. States "I think I may need to be hospitalized". Denies pain, denies urinary symptoms. Ambulatory to ED room from lobby with steady gait. NAD.

## 2021-06-28 NOTE — ED Provider Notes (Signed)
CT negative.  Patient feels improved and is tolerating p.o.  Will discharge home with a prescription for Zofran and follow-up with primary care doctor.  Discussed my standard return precautions   I have personally reviewed the images performed during this visit and I agree with the Radiologist's read.   Interpretation by Radiologist:  CT ABDOMEN PELVIS W CONTRAST  Result Date: 06/27/2021 CLINICAL DATA:  Nausea and vomiting EXAM: CT ABDOMEN AND PELVIS WITH CONTRAST TECHNIQUE: Multidetector CT imaging of the abdomen and pelvis was performed using the standard protocol following bolus administration of intravenous contrast. CONTRAST:  38mL OMNIPAQUE IOHEXOL 300 MG/ML  SOLN COMPARISON:  None. FINDINGS: Lower chest: No acute abnormality. Hepatobiliary: Mild fatty infiltration of the liver is noted. The gallbladder appears within normal limits. Pancreas: Unremarkable. No pancreatic ductal dilatation or surrounding inflammatory changes. Spleen: Normal in size without focal abnormality. Adrenals/Urinary Tract: Adrenal glands are within normal limits. Kidneys demonstrate a normal enhancement pattern bilaterally. No renal calculi or obstructive changes are noted. The ureters are within normal limits bilaterally. The bladder is decompressed. Stomach/Bowel: Distal colon is decompressed. No obstructive changes are seen. The appendix is within normal limits. Small bowel appears within normal limits. Stomach shows a small sliding-type hiatal hernia. Vascular/Lymphatic: Abdominal aorta is within normal limits. Prominent ovarian vein is noted on the left with prominent periuterine veins. Similar prominent periuterine veins are noted on the right draining into the internal iliac vein on the right. No sizable lymphadenopathy is noted. Reproductive: Uterus is within normal limits. Prominent periuterine veins are seen. No adnexal abnormality is seen. Other: No abdominal wall hernia or abnormality. No abdominopelvic ascites.  Musculoskeletal: No acute or significant osseous findings. IMPRESSION: No acute abnormality to correspond with the patient's given clinical history. Prominent periuterine veins bilaterally as described. These may represent some changes of pelvic congestion. No other focal abnormality is noted. Electronically Signed   By: Alcide Clever M.D.   On: 06/27/2021 23:15       Nita Sickle, MD 06/28/21 [phone]

## 2021-06-28 NOTE — ED Notes (Signed)
Blood viscous. Unable to draw off IV line.

## 2021-06-28 NOTE — ED Provider Notes (Signed)
[facility] Emergency Department Provider Note  ____________________________________________   Event Date/Time   First MD Initiated Contact with Patient 06/28/21 1054     (approximate)  I have reviewed the triage vital signs and the nursing notes.   HISTORY  Chief Complaint Nausea   HPI Yvette [name] is a 40 y.o. female with past medical history of asthma, anemia, GERD and alcohol abuse who presents for assessment of ongoing nausea vomiting.  Patient was last assessed in the emergency room yesterday and discharged yesterday evening after she underwent very extensive work-up including labs and CT of the abdomen pelvis to rule reassuring with patient feeling better after antiemetics.  It seems her symptoms started early morning yesterday and she reports she had some blood streaks in her emesis over the last 24 hours as well.  She states she no longer has any abdominal pain after leaving the hospital yesterday but has still had some several episodes of vomiting.  She denies any other new symptoms including headache, earache, sore throat, fevers, chest pain, back pain, cough, shortness of breath, urinary symptoms, diarrhea, rash or extremity pain.  She states she has struggled with this several times in the past previous requiring hospitalization for intractable nausea and vomiting.  She states that she previously struggled with alcohol use but has not had any alcohol in several weeks.  She denies any significant NSAID use or other illicit drug use.  She states she is prescribed Protonix but has not been able to keep this down either because of the nausea and vomiting.  She states she was prescribed Zofran after the hospital yesterday but does not feel this helps.  No other acute concerns at this time.         Past Medical History:  Diagnosis Date   Asthma    H/O 73 disease    as 78 month old child and at 85 years   History of anemia    History of blood  transfusion 1984   Leukopenia    Neutropenia (HCC)     Patient Active Problem List   Diagnosis Date Noted   Alcoholic liver disease, unspecified (HCC) 10/26/2020   Alcohol withdrawal (HCC) 10/26/2020   Alcoholic ketoacidosis 10/26/2020   AKI (acute kidney injury) (HCC) 10/26/2020   High serum osmolar gap 10/26/2020   Alcoholic cirrhosis of liver without ascites (HCC)    Hiatal hernia    Portal hypertension (HCC)    Nausea & vomiting 11/10/2019   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   Intractable vomiting 10/01/2019   Alcohol use, daily 09/30/2019   Elevated LFTs 09/30/2019   Thrombocytopenia (HCC) 09/30/2019   Hypokalemia    Intractable vomiting with nausea 08/23/2019   Dehydration    Food poisoning    Other neutropenia (HCC) 06/11/2016   Leukopenia 02/03/2015    Past Surgical History:  Procedure Laterality Date   ESOPHAGOGASTRODUODENOSCOPY N/A 10/29/2020   Procedure: ESOPHAGOGASTRODUODENOSCOPY (EGD);  Surgeon: Wyline Mood, MD;  Location: [facility] ENDOSCOPY;  Service: Gastroenterology;  [name];   ESOPHAGOGASTRODUODENOSCOPY (EGD) WITH PROPOFOL N/A 03/14/2020   Procedure: ESOPHAGOGASTRODUODENOSCOPY (EGD) WITH PROPOFOL;  Surgeon: Pasty Spillers, MD;  [name];  Service: Endoscopy;  [name];   ESOPHAGOGASTRODUODENOSCOPY (EGD) WITH PROPOFOL N/A 02/25/2021   Procedure: ESOPHAGOGASTRODUODENOSCOPY (EGD) WITH PROPOFOL;  Surgeon: Pasty Spillers, MD;  [name];  Service: Endoscopy;  [name];    Prior to Admission medications   Medication Sig Start Date End Date Taking? Authorizing Provider  ondansetron [facility])  4 MG tablet Take 1 tablet (4 mg total) by mouth daily as needed for nausea or vomiting. 06/28/21 06/28/22  Don Perking, [place], MD  pantoprazole (PROTONIX) 40 MG tablet Take 1 tablet (40 mg total) by mouth 2 (two) times daily. 10/31/20 02/25/21  Charise Killian, MD    Allergies Patient has no known allergies.  Family  History  Problem Relation Age of Onset   Lupus Mother    Cancer Paternal Aunt        Breast    Cancer Paternal Aunt        Breast     Social History Social History   Tobacco Use   Smoking status: Every Day    Packs/day: 0.20    Years: 15.00    Pack years: 3.00    Types: Cigarettes   Smokeless tobacco: Never  Vaping Use   Vaping Use: Never used  Substance Use Topics   Alcohol use: Yes    Alcohol/week: 61.0 standard drinks    Types: 21 Cans of beer, [name]   Drug use: No    Review of Systems  Review of Systems  Constitutional:  Negative for chills and fever.  HENT:  Negative for sore throat.   Eyes:  Negative for pain.  Respiratory:  Negative for cough and stridor.   Cardiovascular:  Negative for chest pain.  Gastrointestinal:  Positive for nausea and vomiting.  Skin:  Negative for rash.  Neurological:  Negative for seizures, loss of consciousness and headaches.  Psychiatric/Behavioral:  Negative for suicidal ideas.   All other systems reviewed and are negative.    ____________________________________________   PHYSICAL EXAM:  VITAL SIGNS: ED Triage Vitals  Enc Vitals Group     BP 06/28/21 1030 (!) 174/101     Pulse Rate 06/28/21 1030 70     Resp 06/28/21 1030 20     Temp 06/28/21 1033 98.1 F (36.7 C)     Temp Source 06/28/21 1033 Oral     SpO2 06/28/21 1030 97 %     Weight 06/28/21 1031 105 lb (47.6 kg)     Height 06/28/21 1031 5\' 1"  (1.549 m)     Head Circumference --      Peak Flow --      Pain Score 06/28/21 1031 0     Pain Loc --      Pain Edu? --      Excl. in GC? --    Vitals:   06/28/21 1033 06/28/21 1300  BP:  103/71  Pulse:  80  Resp:  17  Temp: 98.1 F (36.7 C)   SpO2:  97%   Physical Exam Vitals and nursing note reviewed.  Constitutional:      General: She is not in acute distress.    Appearance: She is well-developed.  HENT:     Head: Normocephalic and atraumatic.      Right Ear: External ear normal.     Left Ear: External ear normal.     Nose: Nose normal.     Mouth/Throat:     Mouth: Mucous membranes are dry.  Eyes:     Conjunctiva/sclera: Conjunctivae normal.  Cardiovascular:     Rate and Rhythm: Normal rate and regular rhythm.     Heart sounds: No murmur heard. Pulmonary:     Effort: Pulmonary effort is normal. No respiratory distress.     Breath sounds: Normal breath sounds.  Abdominal:  Palpations: Abdomen is soft.     Tenderness: There is no abdominal tenderness.  Musculoskeletal:        General: No swelling.     Cervical back: Neck supple.  Skin:    General: Skin is warm and dry.     Capillary Refill: Capillary refill takes 2 to 3 seconds.  Neurological:     Mental Status: She is alert and oriented to person, place, and time.  Psychiatric:        Mood and Affect: Mood normal.     ____________________________________________   LABS (all labs ordered are listed, but only abnormal results are displayed)  Labs Reviewed  BASIC METABOLIC PANEL - Abnormal; Notable for the following components:      Result Value   Potassium 3.3 (*)    All other components within normal limits  RESP PANEL BY RT-PCR (FLU A&B, COVID) ARPGX2  MAGNESIUM   ____________________________________________  EKG  ____________________________________________  RADIOLOGY  ED MD interpretation: CT abdomen and pelvis obtained yesterday reviewed by myself shows no evidence of SBO, appendicitis, pancreatitis, cirrhosis, SBO or other acute process.  Official radiology report(s): CT ABDOMEN PELVIS W CONTRAST  Result Date: 06/27/2021 CLINICAL DATA:  Nausea and vomiting EXAM: CT ABDOMEN AND PELVIS WITH CONTRAST TECHNIQUE: Multidetector CT imaging of the abdomen and pelvis was performed using the standard protocol following bolus administration of intravenous contrast. CONTRAST:  70mL OMNIPAQUE IOHEXOL 300 MG/ML  SOLN COMPARISON:  None. FINDINGS: Lower chest: No  acute abnormality. Hepatobiliary: Mild fatty infiltration of the liver is noted. The gallbladder appears within normal limits. Pancreas: Unremarkable. No pancreatic ductal dilatation or surrounding inflammatory changes. Spleen: Normal in size without focal abnormality. Adrenals/Urinary Tract: Adrenal glands are within normal limits. Kidneys demonstrate a normal enhancement pattern bilaterally. No renal calculi or obstructive changes are noted. The ureters are within normal limits bilaterally. The bladder is decompressed. Stomach/Bowel: Distal colon is decompressed. No obstructive changes are seen. The appendix is within normal limits. Small bowel appears within normal limits. Stomach shows a small sliding-type hiatal hernia. Vascular/Lymphatic: Abdominal aorta is within normal limits. Prominent ovarian vein is noted on the left with prominent periuterine veins. Similar prominent periuterine veins are noted on the right draining into the internal iliac vein on the right. No sizable lymphadenopathy is noted. Reproductive: Uterus is within normal limits. Prominent periuterine veins are seen. No adnexal abnormality is seen. Other: No abdominal wall hernia or abnormality. No abdominopelvic ascites. Musculoskeletal: No acute or significant osseous findings. IMPRESSION: No acute abnormality to correspond with the patient's given clinical history. Prominent periuterine veins bilaterally as described. These may represent some changes of pelvic congestion. No other focal abnormality is noted. Electronically Signed   By: Alcide Clever M.D.   On: 06/27/2021 23:15    ____________________________________________   PROCEDURES  Procedure(s) performed (including Critical Care):  Procedures   ____________________________________________   INITIAL IMPRESSION / ASSESSMENT AND PLAN / ED COURSE        Patient presents with above-stated history exam for assessment of some continued nausea and vomiting after recently  being discharged following evaluation for nausea and vomiting abdominal pain.  It seems patient has not taken any of her prescribed Zofran because she feels this is never helpful.  On arrival she is slightly hypertensive with otherwise stable vital signs on room air.  She does appear dehydrated but her abdomen is soft nontender throughout.  Reviewed her work-up when she was seen yesterday the emergency room including labs and CT.  Lipase yesterday  showed no evidence of pancreatitis.  CMP showed no evidence of significant metabolic or electrolyte derangements aside from some mild acidosis.  I suspect this may be from some starvation ketosis.  CBC yesterday showed no leukocytosis or acute anemia.  hCG was negative.  COVID influenza PCR is negative.  UA has some hemoglobin and ketones and protein but otherwise no evidence of infection.  UDS negative.  CT as noted above without evidence of acute abdominal or pelvic process.  Today given resolution of abdominal pain I have a low suspicion for development of other significant acute intra-abdominal process and I suspect continued symptomatic nausea and vomiting from gastritis as was suspected yesterday.  Given absence of pain per patient or tenderness on exam and stable vitals do not believe she requires repeat CT, UA, lipase or CBC although will check a BMP and magnesium to assess patient's electrolytes and kidney function.  She does appear dehydrated and so we will give a dose of droperidol and some IV fluids she states the droperidol significantly helped yesterday.  Following the setting is reasonable to attempt again p.o. challenge with Phenergan p.o. as this is worked well with the patient in the past.  At this time I have a low suspicion for acute appendicitis, SBO, diverticulitis, cholecystitis, hepatitis or other immediate life-threatening process.  BMP remarkable for potassium 3.3 without other significant electrolyte or metabolic derangements.  Magnesium  at this low end of normal at 1.7.  On several reassessments after below noted antiemetics patient still complaining of nausea and vomited after her third antiemetic.  I will admit for intractable nausea and vomiting secondary to gastritis versus possible gastroparesis.     ____________________________________________   FINAL CLINICAL IMPRESSION(S) / ED DIAGNOSES  Final diagnoses:  Nausea and vomiting, unspecified vomiting type  Hypokalemia    Medications  potassium chloride 10 mEq in 100 mL IVPB (10 mEq Intravenous New Bag/Given 06/28/21 1316)  magnesium sulfate IVPB 2 g 50 mL (2 g Intravenous New Bag/Given 06/28/21 1327)  lactated ringers bolus 1,000 mL (1,000 mLs Intravenous New Bag/Given 06/28/21 1146)  droperidol (INAPSINE) 2.5 MG/ML injection 1.25 mg (1.25 mg Intravenous Given 06/28/21 1148)  pantoprazole (PROTONIX) injection 40 mg (40 mg Intravenous Given 06/28/21 1147)  metoCLOPramide (REGLAN) injection 10 mg (10 mg Intravenous Given 06/28/21 1310)  ondansetron (ZOFRAN) injection 4 mg (4 mg Intravenous Given 06/28/21 1335)     ED Discharge Orders          Ordered    promethazine (PHENERGAN) 12.5 MG tablet  Every 8 hours PRN,   Status:  Discontinued        06/28/21 1319    promethazine (PHENERGAN) 25 MG suppository  Every 8 hours PRN,   Status:  Discontinued        06/28/21 1319             Note:  This document was prepared using Dragon voice recognition software and may include unintentional dictation errors.    Gilles Chiquito, MD 06/28/21 [phone]

## 2021-06-28 NOTE — ED Notes (Signed)
Called lab @1215  for the blood

## 2021-06-28 NOTE — ED Triage Notes (Signed)
Pt via POV from home. Pt was seen and d/c last night for abd pain and vomiting. Denies any new symptoms pt states she no longer has pain. States she still has vomiting. Pt is A&Ox4 and NAD

## 2021-06-29 DIAGNOSIS — Z20822 Contact with and (suspected) exposure to covid-19: Secondary | ICD-10-CM | POA: Diagnosis present

## 2021-06-29 DIAGNOSIS — R002 Palpitations: Secondary | ICD-10-CM | POA: Diagnosis present

## 2021-06-29 DIAGNOSIS — K920 Hematemesis: Secondary | ICD-10-CM | POA: Diagnosis present

## 2021-06-29 DIAGNOSIS — K219 Gastro-esophageal reflux disease without esophagitis: Secondary | ICD-10-CM | POA: Diagnosis present

## 2021-06-29 DIAGNOSIS — F419 Anxiety disorder, unspecified: Secondary | ICD-10-CM | POA: Diagnosis present

## 2021-06-29 DIAGNOSIS — Z803 Family history of malignant neoplasm of breast: Secondary | ICD-10-CM | POA: Diagnosis not present

## 2021-06-29 DIAGNOSIS — I472 Ventricular tachycardia, unspecified: Secondary | ICD-10-CM | POA: Diagnosis present

## 2021-06-29 DIAGNOSIS — I4729 Other ventricular tachycardia: Secondary | ICD-10-CM | POA: Diagnosis not present

## 2021-06-29 DIAGNOSIS — Z4509 Encounter for adjustment and management of other cardiac device: Secondary | ICD-10-CM | POA: Diagnosis not present

## 2021-06-29 DIAGNOSIS — F10188 Alcohol abuse with other alcohol-induced disorder: Secondary | ICD-10-CM | POA: Diagnosis present

## 2021-06-29 DIAGNOSIS — J45909 Unspecified asthma, uncomplicated: Secondary | ICD-10-CM | POA: Diagnosis present

## 2021-06-29 DIAGNOSIS — K2921 Alcoholic gastritis with bleeding: Secondary | ICD-10-CM | POA: Diagnosis present

## 2021-06-29 DIAGNOSIS — E876 Hypokalemia: Secondary | ICD-10-CM | POA: Diagnosis present

## 2021-06-29 DIAGNOSIS — R55 Syncope and collapse: Secondary | ICD-10-CM | POA: Diagnosis present

## 2021-06-29 DIAGNOSIS — F17210 Nicotine dependence, cigarettes, uncomplicated: Secondary | ICD-10-CM | POA: Diagnosis present

## 2021-06-29 DIAGNOSIS — F1010 Alcohol abuse, uncomplicated: Secondary | ICD-10-CM | POA: Diagnosis present

## 2021-06-29 DIAGNOSIS — M303 Mucocutaneous lymph node syndrome [Kawasaki]: Secondary | ICD-10-CM | POA: Diagnosis present

## 2021-06-29 DIAGNOSIS — Z832 Family history of diseases of the blood and blood-forming organs and certain disorders involving the immune mechanism: Secondary | ICD-10-CM | POA: Diagnosis not present

## 2021-06-29 DIAGNOSIS — K2211 Ulcer of esophagus with bleeding: Secondary | ICD-10-CM | POA: Diagnosis present

## 2021-06-29 DIAGNOSIS — K2920 Alcoholic gastritis without bleeding: Secondary | ICD-10-CM | POA: Diagnosis not present

## 2021-06-29 DIAGNOSIS — R9431 Abnormal electrocardiogram [ECG] [EKG]: Secondary | ICD-10-CM | POA: Diagnosis not present

## 2021-06-29 DIAGNOSIS — R112 Nausea with vomiting, unspecified: Secondary | ICD-10-CM | POA: Diagnosis not present

## 2021-06-29 LAB — CBC
HCT: 30.7 % — ABNORMAL LOW (ref 36.0–46.0)
Hemoglobin: 10.2 g/dL — ABNORMAL LOW (ref 12.0–15.0)
MCH: 28 pg (ref 26.0–34.0)
MCHC: 33.2 g/dL (ref 30.0–36.0)
MCV: 84.3 fL (ref 80.0–100.0)
Platelets: 87 10*3/uL — ABNORMAL LOW (ref 150–400)
RBC: 3.64 MIL/uL — ABNORMAL LOW (ref 3.87–5.11)
RDW: 16.7 % — ABNORMAL HIGH (ref 11.5–15.5)
WBC: 3.6 10*3/uL — ABNORMAL LOW (ref 4.0–10.5)
nRBC: 0 % (ref 0.0–0.2)

## 2021-06-29 LAB — BASIC METABOLIC PANEL
Anion gap: 9 (ref 5–15)
BUN: <5 mg/dL — ABNORMAL LOW (ref 6–20)
CO2: 22 mmol/L (ref 22–32)
Calcium: 8.4 mg/dL — ABNORMAL LOW (ref 8.9–10.3)
Chloride: 101 mmol/L (ref 98–111)
Creatinine, Ser: 0.68 mg/dL (ref 0.44–1.00)
GFR, Estimated: >60 mL/min (ref >60)
Glucose, Bld: 83 mg/dL (ref 70–99)
Potassium: 4 mmol/L (ref 3.5–5.1)
Sodium: 132 mmol/L — ABNORMAL LOW (ref 135–145)

## 2021-06-29 LAB — CBG MONITORING, ED: Glucose-Capillary: 74 mg/dL (ref 70–99)

## 2021-06-29 MED ORDER — METOCLOPRAMIDE HCL 5 MG/ML IJ SOLN: 5.0000 mg | Freq: Four times a day (QID) | INTRAMUSCULAR | Status: DC | PRN

## 2021-06-29 MED ORDER — MAGNESIUM SULFATE 2 GM/50ML IV SOLN
INTRAVENOUS | Status: AC
  Filled 2021-06-29: qty 50

## 2021-06-29 MED ORDER — MAGNESIUM SULFATE 2 GM/50ML IV SOLN
2.0000 g | Freq: Once | INTRAVENOUS | Status: AC
  Administered 2021-06-30: 2 g via INTRAVENOUS

## 2021-06-29 MED ORDER — POTASSIUM PHOSPHATES 15 MMOLE/5ML IV SOLN
15.0000 mmol | Freq: Once | INTRAVENOUS | Status: AC
  Administered 2021-06-29: 16:00:00 15 mmol via INTRAVENOUS
  Filled 2021-06-29: qty 5

## 2021-06-29 NOTE — Progress Notes (Signed)
Dr. Toniann Fail acknowledged; new orders written. Windy Carina, RN 11:35 PM 06/29/2021

## 2021-06-29 NOTE — ED Notes (Signed)
Pt resting at this time with eyes closed. Pt is not in any acute distress at this time. No N/V noted at this time.

## 2021-06-29 NOTE — ED Notes (Signed)
Pt breakfast tray arrived and provided.

## 2021-06-29 NOTE — Progress Notes (Signed)
PROGRESS NOTE    Yvette [name]  QRF:[id number] DOB: October 23, 1980 DOA: 06/28/2021 PCP: [facility]    Brief Narrative:  40 year old with history of smoking, ongoing alcohol use, asthma and GERD presented with intractable nausea vomiting of several days.  Also reported streaks of blood with vomiting.  Recently had upper GI endoscopy and found to have esophageal ulcerations due to reflux.  In the emergency room hemodynamically stable.  CT scan of the abdomen pelvis normal.  Lipase normal.  Seen in the emergency room and sent back home however could not eat so came back to ER.   Assessment & Plan:   Principal Problem:   Intractable nausea and vomiting Active Problems:   Hypokalemia   Alcoholic gastritis   GERD (gastroesophageal reflux disease)   Tobacco abuse   Alcohol abuse   Hematemesis  Intractable nausea and vomiting: Suspect alcohol induced reflux/gastritis: Still with symptoms. Continue IV fluids.  Continue Phenergan for nausea.  IV Protonix 40 mg twice daily.  Currently no evidence of active upper GI bleeding. Challenge with liquids and advance if tolerated.  Hypokalemia, replaced.  Adequate Hypomagnesemia, replaced. Hypophosphatemia, replace further today.  Smoker: Counseled to quit.  Nicotine patch.  Alcohol use: Ongoing intermittent alcohol use.  Denies any withdrawal symptoms.  On CIWA protocol.  DVT prophylaxis: SCDs Start: 06/28/21 1441   Code Status: Full code Family Communication: None Disposition Plan: Status is: Observation  The patient will require care spanning > 2 midnights and should be moved to inpatient because: Not tolerating oral intake.        Consultants:  None  Procedures:  None  Antimicrobials:  None   Subjective: Patient seen and examined.  She is a still in the emergency room hallway.  Was on a scheduled metoclopramide overnight and denied any vomiting.  Does not have an appetite.  Scared to try to eat  anything.  Last bowel movement was 5 days ago.  Denies any abdominal pain or distention.  Objective: Vitals:   06/28/21 2206 06/29/21 0200 06/29/21 0500 06/29/21 0820  BP: (!) 147/89 (!) 170/81 (!) 173/97 (!) 162/75  Pulse: 70 (!) 52 (!) 53 (!) 48  Resp: 20 20 20 14   Temp:      TempSrc:      SpO2: 100% 100% 99% 98%  Weight:      Height:        Intake/Output Summary (Last 24 hours) at 06/29/2021 1327 Last data filed at 06/29/2021 0410 Gross per 24 hour  Intake 250 ml  Output 100 ml  Net 150 ml   Filed Weights   06/28/21 1031  Weight: 47.6 kg    Examination:  General: Looks fairly comfortable but anxious. Cardiovascular: S1-S2 normal.  Regular rate rhythm. Respiratory: Bilateral clear. Gastrointestinal: Soft.  Nontender.  Bowel sound present.  No rigidity.  No guarding. Ext: No cyanosis or edema.   Data Reviewed: I have personally reviewed following labs and imaging studies  CBC: Recent Labs  Lab 06/27/21 1237 06/29/21 0615  WBC 7.1 3.6*  HGB 12.1 10.2*  HCT 36.3 30.7*  MCV 83.8 84.3  PLT 137* 87*   Basic Metabolic Panel: Recent Labs  Lab 06/27/21 1237 06/28/21 1226 06/29/21 0615  NA 140 137 132*  K 3.7 3.3* 4.0  CL 102 104 101  CO2 20* 23 22  GLUCOSE 99 92 83  BUN 6 6 <5*  CREATININE 0.52 0.54 0.68  CALCIUM 9.4 9.0 8.4*  MG  --  1.7  --  PHOS  --  2.2*  --    GFR: Estimated Creatinine Clearance: 71.6 mL/min (by C-G formula based on SCr of 0.68 mg/dL). Liver Function Tests: Recent Labs  Lab 06/27/21 1237  AST 89*  ALT 40  ALKPHOS 81  BILITOT 0.8  PROT 9.0*  ALBUMIN 4.9   Recent Labs  Lab 06/27/21 1237  LIPASE 20   No results for input(s): AMMONIA in the last 168 hours. Coagulation Profile: No results for input(s): INR, PROTIME in the last 168 hours. Cardiac Enzymes: No results for input(s): CKTOTAL, CKMB, CKMBINDEX, TROPONINI in the last 168 hours. BNP (last 3 results) No results for input(s): PROBNP in the last 8760  hours. HbA1C: No results for input(s): HGBA1C in the last 72 hours. CBG: Recent Labs  Lab 06/29/21 0759  GLUCAP 74   Lipid Profile: No results for input(s): CHOL, HDL, LDLCALC, TRIG, CHOLHDL, LDLDIRECT in the last 72 hours. Thyroid Function Tests: No results for input(s): TSH, T4TOTAL, FREET4, T3FREE, THYROIDAB in the last 72 hours. Anemia Panel: No results for input(s): VITAMINB12, FOLATE, FERRITIN, TIBC, IRON, RETICCTPCT in the last 72 hours. Sepsis Labs: No results for input(s): PROCALCITON, LATICACIDVEN in the last 168 hours.  Recent Results (from the past 240 hour(s))  Resp Panel by RT-PCR (Flu A&B, Covid) Nasopharyngeal Swab     Status: None   Collection Time: 06/27/21 11:22 PM   Specimen: Nasopharyngeal Swab; Nasopharyngeal(NP) swabs in vial transport medium  Result Value Ref Range Status   SARS Coronavirus 2 by RT PCR NEGATIVE NEGATIVE Final    Comment: (NOTE) SARS-CoV-2 target nucleic acids are NOT DETECTED.  The SARS-CoV-2 RNA is generally detectable in upper respiratory specimens during the acute phase of infection. The lowest concentration of SARS-CoV-2 viral copies this assay can detect is 138 copies/mL. A negative result does not preclude SARS-Cov-2 infection and should not be used as the sole basis for treatment or other patient management decisions. A negative result may occur with  improper specimen collection/handling, submission of specimen other than nasopharyngeal swab, presence of viral mutation(s) within the areas targeted by this assay, and inadequate number of viral copies(<138 copies/mL). A negative result must be combined with clinical observations, patient history, and epidemiological information. The expected result is Negative.  Fact Sheet for Patients:  BloggerCourse.com  Fact Sheet for Healthcare Providers:  SeriousBroker.it  This test is no t yet approved or cleared by the [place] FDA  and  has been authorized for detection and/or diagnosis of SARS-CoV-2 by FDA under an Emergency Use Authorization (EUA). This EUA will remain  in effect (meaning this test can be used) for the duration of the COVID-19 declaration under Section 564(b)(1) of the Act, 21 U.S.C.section 360bbb-3(b)(1), unless the authorization is terminated  or revoked sooner.       Influenza A by PCR NEGATIVE NEGATIVE Final   Influenza B by PCR NEGATIVE NEGATIVE Final    Comment: (NOTE) The Xpert Xpress SARS-CoV-2/FLU/RSV plus assay is intended as an aid in the diagnosis of influenza from Nasopharyngeal swab specimens and should not be used as a sole basis for treatment. Nasal washings and aspirates are unacceptable for Xpert Xpress SARS-CoV-2/FLU/RSV testing.  Fact Sheet for Patients: BloggerCourse.com  Fact Sheet for Healthcare Providers: SeriousBroker.it  This test is not yet approved or cleared by the [place] FDA and has been authorized for detection and/or diagnosis of SARS-CoV-2 by FDA under an Emergency Use Authorization (EUA). This EUA will remain in effect (meaning this test can be used) for  the duration of the COVID-19 declaration under Section 564(b)(1) of the Act, 21 U.S.C. section 360bbb-3(b)(1), unless the authorization is terminated or revoked.  Performed at [facility], [address] [place], [place] [address]   Resp Panel by RT-PCR (Flu A&B, Covid) Nasopharyngeal Swab     Status: None   Collection Time: 06/28/21  2:25 PM   Specimen: Nasopharyngeal Swab; Nasopharyngeal(NP) swabs in vial transport medium  Result Value Ref Range Status   SARS Coronavirus 2 by RT PCR NEGATIVE NEGATIVE Final    Comment: (NOTE) SARS-CoV-2 target nucleic acids are NOT DETECTED.  The SARS-CoV-2 RNA is generally detectable in upper respiratory specimens during the acute phase of infection. The lowest concentration of SARS-CoV-2 viral  copies this assay can detect is 138 copies/mL. A negative result does not preclude SARS-Cov-2 infection and should not be used as the sole basis for treatment or other patient management decisions. A negative result may occur with  improper specimen collection/handling, submission of specimen other than nasopharyngeal swab, presence of viral mutation(s) within the areas targeted by this assay, and inadequate number of viral copies(<138 copies/mL). A negative result must be combined with clinical observations, patient history, and epidemiological information. The expected result is Negative.  Fact Sheet for Patients:  BloggerCourse.com  Fact Sheet for Healthcare Providers:  SeriousBroker.it  This test is no t yet approved or cleared by the [place] FDA and  has been authorized for detection and/or diagnosis of SARS-CoV-2 by FDA under an Emergency Use Authorization (EUA). This EUA will remain  in effect (meaning this test can be used) for the duration of the COVID-19 declaration under Section 564(b)(1) of the Act, 21 U.S.C.section 360bbb-3(b)(1), unless the authorization is terminated  or revoked sooner.       Influenza A by PCR NEGATIVE NEGATIVE Final   Influenza B by PCR NEGATIVE NEGATIVE Final    Comment: (NOTE) The Xpert Xpress SARS-CoV-2/FLU/RSV plus assay is intended as an aid in the diagnosis of influenza from Nasopharyngeal swab specimens and should not be used as a sole basis for treatment. Nasal washings and aspirates are unacceptable for Xpert Xpress SARS-CoV-2/FLU/RSV testing.  Fact Sheet for Patients: BloggerCourse.com  Fact Sheet for Healthcare Providers: SeriousBroker.it  This test is not yet approved or cleared by the [place] FDA and has been authorized for detection and/or diagnosis of SARS-CoV-2 by FDA under an Emergency Use Authorization (EUA). This  EUA will remain in effect (meaning this test can be used) for the duration of the COVID-19 declaration under Section 564(b)(1) of the Act, 21 U.S.C. section 360bbb-3(b)(1), unless the authorization is terminated or revoked.  Performed at [facility], [address] [place], [place] [address]          Radiology Studies: CT ABDOMEN PELVIS W CONTRAST  Result Date: 06/27/2021 CLINICAL DATA:  Nausea and vomiting EXAM: CT ABDOMEN AND PELVIS WITH CONTRAST TECHNIQUE: Multidetector CT imaging of the abdomen and pelvis was performed using the standard protocol following bolus administration of intravenous contrast. CONTRAST:  52mL OMNIPAQUE IOHEXOL 300 MG/ML  SOLN COMPARISON:  None. FINDINGS: Lower chest: No acute abnormality. Hepatobiliary: Mild fatty infiltration of the liver is noted. The gallbladder appears within normal limits. Pancreas: Unremarkable. No pancreatic ductal dilatation or surrounding inflammatory changes. Spleen: Normal in size without focal abnormality. Adrenals/Urinary Tract: Adrenal glands are within normal limits. Kidneys demonstrate a normal enhancement pattern bilaterally. No renal calculi or obstructive changes are noted. The ureters are within normal limits bilaterally. The bladder is decompressed.  Stomach/Bowel: Distal colon is decompressed. No obstructive changes are seen. The appendix is within normal limits. Small bowel appears within normal limits. Stomach shows a small sliding-type hiatal hernia. Vascular/Lymphatic: Abdominal aorta is within normal limits. Prominent ovarian vein is noted on the left with prominent periuterine veins. Similar prominent periuterine veins are noted on the right draining into the internal iliac vein on the right. No sizable lymphadenopathy is noted. Reproductive: Uterus is within normal limits. Prominent periuterine veins are seen. No adnexal abnormality is seen. Other: No abdominal wall hernia or abnormality. No abdominopelvic  ascites. Musculoskeletal: No acute or significant osseous findings. IMPRESSION: No acute abnormality to correspond with the patient's given clinical history. Prominent periuterine veins bilaterally as described. These may represent some changes of pelvic congestion. No other focal abnormality is noted. Electronically Signed   By: Alcide Clever M.D.   On: 06/27/2021 23:15        Scheduled Meds:  folic acid  1 mg Oral Daily   LORazepam  0-4 mg Intravenous Q6H   Followed by   Melene Muller ON 06/30/2021] LORazepam  0-4 mg Intravenous Q12H   mirtazapine  15 mg Oral QHS   multivitamin with minerals  1 tablet Oral Daily   nicotine  21 mg Transdermal Daily   pantoprazole (PROTONIX) IV  40 mg Intravenous Q12H   thiamine  100 mg Oral Daily   Or   thiamine  100 mg Intravenous Daily   Continuous Infusions:  lactated ringers 125 mL/hr at 06/28/21 1518   potassium PHOSPHATE IVPB (in mmol)     promethazine (PHENERGAN) injection (IM or IVPB) Stopped (06/29/21 0410)     LOS: 0 days    Time spent: 25 minutes    Dorcas Carrow, MD [facility] Pager [phone]

## 2021-06-29 NOTE — Progress Notes (Signed)
Telemetry applied; CCMD stated that patient was having couplets, PACs, abberrant beat runs; [place] PVCs; multifocal PVCs; patient denies heart racing, dizziness or feeling bad. On-Call Provider, Dr. Toniann Fail notified by secure chat; waiting for acknowledgement. Windy Carina, RN 06/29/2021 11:26 PM

## 2021-06-29 NOTE — ED Notes (Signed)
RN encouraged pt to eat breakfast. Pt sts " I am just going to sip on some water." RN advised pt that I had meds for her and she sts " Well I will try and eat a little something."

## 2021-06-30 ENCOUNTER — Encounter: Payer: Self-pay | Admitting: Internal Medicine

## 2021-06-30 ENCOUNTER — Inpatient Hospital Stay (HOSPITAL_COMMUNITY)
Admit: 2021-06-30 | Discharge: 2021-06-30 | Disposition: A | Discharge status: Home / Self Care | Payer: Medicaid Other | Attending: Nurse Practitioner | Admitting: Nurse Practitioner

## 2021-06-30 DIAGNOSIS — I4729 Other ventricular tachycardia: Secondary | ICD-10-CM

## 2021-06-30 DIAGNOSIS — R9431 Abnormal electrocardiogram [ECG] [EKG]: Secondary | ICD-10-CM

## 2021-06-30 LAB — BASIC METABOLIC PANEL
Anion gap: 11 (ref 5–15)
BUN: <5 mg/dL — ABNORMAL LOW (ref 6–20)
CO2: 27 mmol/L (ref 22–32)
Calcium: 9 mg/dL (ref 8.9–10.3)
Chloride: 96 mmol/L — ABNORMAL LOW (ref 98–111)
Creatinine, Ser: 0.53 mg/dL (ref 0.44–1.00)
GFR, Estimated: >60 mL/min (ref >60)
Glucose, Bld: 89 mg/dL (ref 70–99)
Potassium: 4 mmol/L (ref 3.5–5.1)
Sodium: 134 mmol/L — ABNORMAL LOW (ref 135–145)

## 2021-06-30 LAB — TROPONIN I (HIGH SENSITIVITY)
Troponin I (High Sensitivity): 24 ng/L — ABNORMAL HIGH (ref <18)
Troponin I (High Sensitivity): 25 ng/L — ABNORMAL HIGH (ref <18)

## 2021-06-30 LAB — POTASSIUM: Potassium: 3.5 mmol/L (ref 3.5–5.1)

## 2021-06-30 LAB — ECHOCARDIOGRAM COMPLETE
AR max vel: 2.82 cm2
AV Area VTI: 3.97 cm2
AV Area mean vel: 2.46 cm2
AV Mean grad: 2 mmHg
AV Peak grad: 3.6 mmHg
Ao pk vel: 0.95 m/s
Area-P 1/2: 3.83 cm2
Height: 61 in
S' Lateral: 2.4 cm
Weight: 1680 oz

## 2021-06-30 LAB — PHOSPHORUS: Phosphorus: 3.3 mg/dL (ref 2.5–4.6)

## 2021-06-30 LAB — GLUCOSE, CAPILLARY
Glucose-Capillary: 72 mg/dL (ref 70–99)
Glucose-Capillary: 88 mg/dL (ref 70–99)

## 2021-06-30 LAB — MRSA NEXT GEN BY PCR, NASAL: MRSA by PCR Next Gen: NOT DETECTED

## 2021-06-30 LAB — MAGNESIUM
Magnesium: 1.7 mg/dL (ref 1.7–2.4)
Magnesium: 2.1 mg/dL (ref 1.7–2.4)

## 2021-06-30 MED ORDER — LIDOCAINE BOLUS VIA INFUSION
50.0000 mg | Freq: Once | INTRAVENOUS | Status: AC
  Administered 2021-06-30: 16:00:00 50 mg via INTRAVENOUS
  Filled 2021-06-30: qty 52

## 2021-06-30 MED ORDER — POTASSIUM CHLORIDE 10 MEQ/100ML IV SOLN
10.0000 meq | Freq: Once | INTRAVENOUS | Status: AC
  Administered 2021-06-30: 03:00:00 10 meq via INTRAVENOUS
  Filled 2021-06-30 (×2): qty 100

## 2021-06-30 MED ORDER — METOPROLOL TARTRATE 25 MG PO TABS
12.5000 mg | ORAL_TABLET | Freq: Four times a day (QID) | ORAL | Status: DC
  Administered 2021-06-30 – 2021-07-01 (×4): 12.5 mg via ORAL
  Filled 2021-06-30 (×4): qty 1

## 2021-06-30 MED ORDER — LIDOCAINE IN D5W 4-5 MG/ML-% IV SOLN
1.0000 mg/min | INTRAVENOUS | Status: DC
  Administered 2021-06-30: 16:00:00 1 mg/min via INTRAVENOUS
  Filled 2021-06-30 (×2): qty 500

## 2021-06-30 MED ORDER — METOPROLOL TARTRATE 5 MG/5ML IV SOLN
2.5000 mg | Freq: Four times a day (QID) | INTRAVENOUS | Status: DC | PRN
  Administered 2021-06-30: 15:00:00 2.5 mg via INTRAVENOUS
  Filled 2021-06-30: qty 5

## 2021-06-30 MED ORDER — LORAZEPAM 1 MG PO TABS
1.0000 mg | ORAL_TABLET | Freq: Three times a day (TID) | ORAL | Status: DC | PRN
  Administered 2021-07-01: 01:00:00 1 mg via ORAL
  Filled 2021-06-30: qty 1

## 2021-06-30 MED ORDER — ENOXAPARIN SODIUM 40 MG/0.4ML IJ SOSY: 40.0000 mg | PREFILLED_SYRINGE | INTRAMUSCULAR | Status: DC

## 2021-06-30 MED ORDER — MAGNESIUM SULFATE 2 GM/50ML IV SOLN
2.0000 g | Freq: Once | INTRAVENOUS | Status: AC
Start: 2021-06-30 — End: 2021-06-30
  Administered 2021-06-30: 02:00:00 2 g via INTRAVENOUS
  Filled 2021-06-30 (×2): qty 50

## 2021-06-30 MED ORDER — POTASSIUM CHLORIDE CRYS ER 20 MEQ PO TBCR
40.0000 meq | EXTENDED_RELEASE_TABLET | Freq: Two times a day (BID) | ORAL | Status: DC
  Administered 2021-06-30 – 2021-07-01 (×3): 40 meq via ORAL
  Filled 2021-06-30 (×3): qty 2

## 2021-06-30 MED ORDER — CHLORHEXIDINE GLUCONATE CLOTH 2 % EX PADS
6.0000 | MEDICATED_PAD | Freq: Every day | CUTANEOUS | Status: DC
  Administered 2021-06-30: 12:00:00 6 via TOPICAL

## 2021-06-30 MED ORDER — METOPROLOL TARTRATE 5 MG/5ML IV SOLN
2.5000 mg | Freq: Once | INTRAVENOUS | Status: AC
  Administered 2021-06-30: 12:00:00 2.5 mg via INTRAVENOUS
  Filled 2021-06-30: qty 5

## 2021-06-30 NOTE — Consult Note (Signed)
Cardiology Consult    Patient ID: Yvette [name] MRN: [id number], DOB/AGE: 08/20/1980   Admit date: 06/28/2021 Date of Consult: 06/30/2021  Primary Physician: [facility] Primary Cardiologist: Kathlyn Sacramento, MD - new Requesting Provider: Raelyn Mora, MD  Patient Profile    Yvette [name] is a 40 y.o. 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 Sloan Leiter.  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 Noted during admission 06/2021 assoc w/ palpitations and presyncope.   Tobacco abuse    Transaminitis     Past Surgical History:  Procedure Laterality Date   ESOPHAGOGASTRODUODENOSCOPY N/A 10/29/2020   Procedure: ESOPHAGOGASTRODUODENOSCOPY (EGD);  Surgeon: Jonathon Bellows, MD;  Location: [facility] ENDOSCOPY;  Service: Gastroenterology;  [name];   ESOPHAGOGASTRODUODENOSCOPY (EGD) WITH PROPOFOL N/A 03/14/2020   Procedure: ESOPHAGOGASTRODUODENOSCOPY (EGD) WITH PROPOFOL;  Surgeon: Virgel Manifold, MD;  [name];  Service: Endoscopy;  [name];   ESOPHAGOGASTRODUODENOSCOPY (EGD) WITH PROPOFOL N/A 02/25/2021   Procedure: ESOPHAGOGASTRODUODENOSCOPY (EGD) WITH PROPOFOL;  Surgeon: Virgel Manifold, MD;  [name];  Service: Endoscopy;  [name];     Allergies  No Known Allergies  History of Present Illness     40 y.o. 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  She has no prior cardiac history. Also, no FH of premature CAD or sudden cardiac death.  She has had multiple admissions and ED visits over the past 2 years secondary to nausea and vomiting in the setting of alcoholic gastritis w/ associated hypokalemia/hypomagnesemia.  EGD in 02/2021 was normal.  She has had 3 ED visits since 11/25 for recurrent vomiting.  Of note, ECG from 11/25 visit showe da QTc of 559 (remeron and phenergan on pts home med list).  On 12/17, CT of the abd/pelvis showed no acute abnormalities and she was d/c'd home.  Unfortunately, she returned on 12/18 due to ongoing n/v.  K was 3.3 w/ Mg 1.7.  She was admitted and continued on remeron and prn phenergan.  She has required K and Mg supplementation.  On the evening of 12/19, she was noted to have frequent ventricular ectopy w/ recurrent NSVT.  Pt says that she first started noticing palpitations w/ intermittent, brief episodes of presyncope while she was down in the ED.  Similar symptoms accompanied NSVT overnight.  Labs @ [id number] on 12/19 showed nl K @ 4.0 w/ Mg of 1.7.  She continued to have frequent runs of NSVT throughout the night and this AM (currently asymptomatic however) and she has received 4 grams of MgSO4.  HsTrop minimally elevated @ 24  25.  She has never experienced syncope and denies any h/o palpitations/presyncope prior to this admission.  No prior h/o chest pain.  She does have DOE w/ inclines.    Inpatient Medications     folic acid  1 mg Oral Daily  LORazepam  0-4 mg Intravenous Q6H   Followed by   LORazepam  0-4 mg Intravenous Q12H   metoprolol tartrate  2.5 mg Intravenous Once   metoprolol tartrate  12.5 mg Oral Q6H   multivitamin with minerals  1 tablet Oral Daily   thiamine  100 mg Oral Daily   Or   thiamine  100 mg Intravenous Daily    Family History    Family History  Problem Relation Age of Onset   Lupus Mother    Prostate cancer Father    Cancer Paternal Aunt        Breast     Cancer Paternal Aunt        Breast    She indicated that her mother is alive. She indicated that her father is alive. She indicated that her sister is alive.   Social History    Social History   Socioeconomic History   Marital status: Single    Spouse name: Not on file   Number of children: Not on file   Years of education: Not on file   Highest education level: Not on file  Occupational History   Not on file  Tobacco Use   Smoking status: Every Day    Packs/day: 0.20    Years: 15.00    Pack years: 3.00    Types: Cigarettes   Smokeless tobacco: Never  Vaping Use   Vaping Use: Never used  Substance and Sexual Activity   Alcohol use: Yes    Alcohol/week: 61.0 standard drinks    Types: 21 Cans of beer, [name]   Drug use: No   Sexual activity: Yes    Partners: Male    Birth control/protection: Injection  Other Topics Concern   Not on file  Social History Narrative   Lives locally with husband and daughter.  Previously lived in [place], [place].  Does not routinely exercise.   Social Determinants of Health   Financial Resource Strain: Not on file  Food Insecurity: Not on file  Transportation Needs: Not on file  Physical Activity: Not on file  Stress: Not on file  Social Connections: Not on file  Intimate Partner Violence: Not on file     Review of Systems    General:  No chills, fever, night sweats or weight changes.  Cardiovascular:  No chest pain, +++ dyspnea on exertion w/ inclines, no edema, orthopnea, +++ palpitations assoc w/ presyncope this admission, no paroxysmal nocturnal dyspnea. Dermatological: No rash, lesions/masses Respiratory: No cough, +++ dyspnea when walking up inclines. Urologic: No hematuria, dysuria Abdominal:   +++ nausea, +++ vomiting, no diarrhea, bright red blood per rectum, melena, or hematemesis Neurologic:  No visual changes, wkns, changes in mental status. All other systems  reviewed and are otherwise negative except as noted above.  Physical Exam    Blood pressure (!) 142/77, pulse 94, temperature 98.7 F (37.1 C), temperature source Oral, resp. rate 17, height 5\' 1"  (1.549 m), weight 47.6 kg, SpO2 100 %.  General: Pleasant, NAD Psych: Normal affect. Neuro: Alert and oriented X 3. Moves all extremities spontaneously. HEENT: Normal  Neck: Supple without bruits or JVD. Lungs:  Resp regular and unlabored, CTA. Heart: Irreg no s3, s4, or murmurs. Abdomen: Soft, non-tender, non-distended, BS + x 4.  Extremities: No clubbing, cyanosis or edema. DP/PT2+, Radials 2+ and equal bilaterally.  Labs    Cardiac Enzymes Recent Labs  Lab 06/30/21 0140 06/30/21 0340  TROPONINIHS 24* 25*      Lab Results  Component Value Date   WBC 3.6 (L) 06/29/2021   HGB 10.2 (L) 06/29/2021   HCT 30.7 (L) 06/29/2021   MCV 84.3 06/29/2021   PLT 87 (L) 06/29/2021    Recent Labs  Lab 06/27/21 1237 06/28/21 1226 06/29/21 2358  NA 140   < > 134*  K 3.7   < > 4.0  CL 102   < > 96*  CO2 20*   < > 27  BUN 6   < > <5*  CREATININE 0.52   < > 0.53  CALCIUM 9.4   < > 9.0  PROT 9.0*  --   --   BILITOT 0.8  --   --   ALKPHOS 81  --   --   ALT 40  --   --   AST 89*  --   --   GLUCOSE 99   < > 89   < > = values in this interval not displayed.   Lab Results  Component Value Date   CHOL 261 (H) 01/01/2020   TRIG 178 (H) 01/01/2020      Radiology Studies    CT ABDOMEN PELVIS W CONTRAST  Result Date: 06/27/2021 CLINICAL DATA:  Nausea and vomiting EXAM: CT ABDOMEN AND PELVIS WITH CONTRAST TECHNIQUE: Multidetector CT imaging of the abdomen and pelvis was performed using the standard protocol following bolus administration of intravenous contrast. CONTRAST:  79mL OMNIPAQUE IOHEXOL 300 MG/ML  SOLN COMPARISON:  None. FINDINGS: Lower chest: No acute abnormality. Hepatobiliary: Mild fatty infiltration of the liver is noted. The gallbladder appears within normal limits. Pancreas:  Unremarkable. No pancreatic ductal dilatation or surrounding inflammatory changes. Spleen: Normal in size without focal abnormality. Adrenals/Urinary Tract: Adrenal glands are within normal limits. Kidneys demonstrate a normal enhancement pattern bilaterally. No renal calculi or obstructive changes are noted. The ureters are within normal limits bilaterally. The bladder is decompressed. Stomach/Bowel: Distal colon is decompressed. No obstructive changes are seen. The appendix is within normal limits. Small bowel appears within normal limits. Stomach shows a small sliding-type hiatal hernia. Vascular/Lymphatic: Abdominal aorta is within normal limits. Prominent ovarian vein is noted on the left with prominent periuterine veins. Similar prominent periuterine veins are noted on the right draining into the internal iliac vein on the right. No sizable lymphadenopathy is noted. Reproductive: Uterus is within normal limits. Prominent periuterine veins are seen. No adnexal abnormality is seen. Other: No abdominal wall hernia or abnormality. No abdominopelvic ascites. Musculoskeletal: No acute or significant osseous findings. IMPRESSION: No acute abnormality to correspond with the patient's given clinical history. Prominent periuterine veins bilaterally as described. These may represent some changes of pelvic congestion. No other focal abnormality is noted. Electronically Signed   By: Inez Catalina M.D.   On: 06/27/2021 23:15   US ABDOMEN LIMITED RUQ (LIVER/GB)  Result Date: 06/05/2021 CLINICAL DATA:  Elevated LFTs EXAM: ULTRASOUND ABDOMEN LIMITED RIGHT UPPER QUADRANT COMPARISON:  Abdominal ultrasound 09/29/2019 FINDINGS: Gallbladder: No gallstones or wall thickening visualized. No sonographic Murphy sign noted by sonographer. Common bile duct: Diameter: 2 mm Liver: No focal lesion identified. Parenchymal echogenicity is increased consistent with mild fatty infiltration. Portal vein is patent on color Doppler imaging  with normal direction of blood flow towards the liver. Other: None. IMPRESSION: Mild fatty infiltration of the liver, otherwise unremarkable right upper quadrant ultrasound. Electronically Signed   By: Valetta Mole M.D.   On: 06/05/2021 07:16    ECG & Cardiac  Imaging    06/05/2021 RSR, 65, QTc 559 - personally reviewed. 06/30/2021 0138 RSR, 93, baseline artifact, QTc 492.  Assessment & Plan    1.  NSVT:  Since PM 12/19, pt has had nearly incessant runs of NSVT, sometimes associated w/ presyncope and palpitations.  Mg 1.7 w/ K 4.0 last night.  ECG w/ QTc of 492.  HsTrop minimally elevated @ 24  25.  No prior personal or FH of syncope, premature CAD, or SCD.  No h/o chest pain.  Cardiac exam w/o significant murmur/no suggestion of HCM.  She has received MgSO4 4 grams overnight.  I've reviewed her medicine list and note that she has been on both phenergan and remeron as an outpt/inpt.  Phenergan appears to have been d/c'd, and I've d/c'd remeron at this time.  Echo has been ordered and is pending.  Adding ? blocker now.  Rec tx to ICU for closer monitoring.   2.  N/V; H/o alcoholic gastritis:  N/V currently improved.  Avoid QT pronlonging meds.  3.  Elevated HsTrop:  mild HsT elevation @ 24  25 in setting of above.  Suspect demand ischemia.  No chest pain.  F/u echo.  4.  ETOH abuse: cessation advised.  5.  Tob abuse:  smokes 2-3 cigarettes/day.  Cessation advised.  6.  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  Signed, Nicolasa Ducking, NP 06/30/2021, 11:18 AM  For questions or updates, please contact   Please consult [email or web address] for contact info under Cardiology/STEMI.

## 2021-06-30 NOTE — Progress Notes (Signed)
Dr. Toniann Fail notified of elevated Troponins X 2; Acknowledged, no new orders. Continue on telemetry. Windy Carina, RN 5:13 AM; 06/30/2021

## 2021-06-30 NOTE — Progress Notes (Signed)
Patient transferred to ICU room 6. Vallery Ridge, RN gave report to ICU nurse=. No questions or complications at this time.

## 2021-06-30 NOTE — Progress Notes (Signed)
Dr. Toniann Fail notified that patient had an "episode of Torsade dePointes"; Mg IV ordered and started; also Mg 24-urine ordered, lab called for 24 urine container. Will continue to monitor. Windy Carina, RN 12:11 AM 06/30/2021

## 2021-06-30 NOTE — Progress Notes (Signed)
Dr. Toniann Fail notified of CCMD's notice of 25 beat run of Torsade de Pointes; acknowledged; new orders written; patient denies SOB, heart racing, n/v; up to BR; EKG completed; will continue to monitor. Windy Carina, RN; 06/30/2021 1:45 AM

## 2021-06-30 NOTE — Progress Notes (Signed)
PROGRESS NOTE    Yvette [name]  MGQ:[id number] DOB: 04-May-1981 DOA: 06/28/2021 PCP: [facility]    Brief Narrative:  40 year old with history of smoking, ongoing alcohol use, asthma and GERD presented with intractable nausea vomiting of several days.  Also reported streaks of blood with vomiting.  Recently had upper GI endoscopy and found to have esophageal ulcerations due to reflux.  In the emergency room hemodynamically stable.  CT scan of the abdomen pelvis normal.  Lipase normal.  Seen in the emergency room and sent back home however could not eat so came back to ER.  12/19, on symptomatic treatment.  Noted to have multiple NSVT's reported as torsades in telemetry.  Patient with occasional feeling of nausea and dizziness with the episodes.  Otherwise mostly asymptomatic.  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   Assessment & Plan:   Principal Problem:   Intractable nausea and vomiting Active Problems:   Hypokalemia   Alcoholic gastritis   GERD (gastroesophageal reflux disease)   Tobacco abuse   Alcohol abuse   Hematemesis  Abnormal heart rhythm/frequent SVTs: Discontinue all QT prolonging medications) on nausea medicine, Protonix, Remeron. Aggressive electrolyte replacement.  Further replace potassium today.  Keep on scheduled magnesium and potassium. Echocardiogram today. Consulted cardiology for evaluation.  Intractable nausea and vomiting: Suspect alcohol induced reflux/gastritis: Appetite poor. Challenge with liquids today. Avoiding QT prolonging medications.  Continue maintenance IV fluids today.  Hypokalemia, replaced.  Adequate Hypomagnesemia, replaced. Hypophosphatemia, replaced.  Smoker: Counseled to quit.  Nicotine patch discontinued for arrhythmia.  Alcohol use: Ongoing intermittent alcohol use.  Denies any withdrawal symptoms.  On CIWA protocol.  DVT prophylaxis: SCDs Start: 06/28/21 1441   Code Status: Full  code Family Communication: None Disposition Plan: Status is: Inpatient.  Remains inpatient: Significant dysrhythmias.    Consultants:  Cardiology  Procedures:  None  Antimicrobials:  None   Subjective:  Patient seen and examined.  Overnight events noted.  Patient was more anxious with multiple events happened overnight.  Has occasional nausea but no vomiting.  Could not eat much.  Overnight had some episode of nausea and chest discomfort when they were asking her about irregular rhythm.  Objective: Vitals:   06/30/21 0816 06/30/21 1119 06/30/21 1200 06/30/21 1300  BP: (!) 142/77 (!) 146/101 (!) 139/125 (!) 142/87  Pulse: 94 93 (!) 120 62  Resp: 17 16 19 19   Temp: 98.7 F (37.1 C) 99 F (37.2 C)    TempSrc: Oral Oral    SpO2: 100% 100% 100% 100%  Weight:  44.4 kg    Height:  5\' 1"  (1.549 m)      Intake/Output Summary (Last 24 hours) at 06/30/2021 1400 Last data filed at 06/30/2021 0900 Gross per 24 hour  Intake 2357.68 ml  Output 600 ml  Net 1757.68 ml   Filed Weights   06/28/21 1031 06/30/21 1119  Weight: 47.6 kg 44.4 kg    Examination:  General: Looks fairly comfortable but anxious. Flat affect.  Not in distress.  On room air. Cardiovascular: S1-S2 normal.  Regular rate rhythm. Respiratory: Bilateral clear. Gastrointestinal: Soft.  Nontender.  Bowel sound present.  No rigidity.  No guarding. Ext: No cyanosis or edema.   Data Reviewed: I have personally reviewed following labs and imaging studies  CBC: Recent Labs  Lab 06/27/21 1237 06/29/21 0615  WBC 7.1 3.6*  HGB 12.1 10.2*  HCT 36.3 30.7*  MCV 83.8 84.3  PLT 137* 87*   Basic Metabolic Panel:  Recent Labs  Lab 06/27/21 1237 06/28/21 1226 06/29/21 0615 06/29/21 2358 06/30/21 1152  NA 140 137 132* 134*  --   K 3.7 3.3* 4.0 4.0 3.5  CL 102 104 101 96*  --   CO2 20* 23 22 27   --   GLUCOSE 99 92 83 89  --   BUN 6 6 <5* <5*  --   CREATININE 0.52 0.54 0.68 0.53  --   CALCIUM 9.4 9.0 8.4*  9.0  --   MG  --  1.7  --  1.7 2.1  PHOS  --  2.2*  --   --  3.3   GFR: Estimated Creatinine Clearance: 66.8 mL/min (by C-G formula based on SCr of 0.53 mg/dL). Liver Function Tests: Recent Labs  Lab 06/27/21 1237  AST 89*  ALT 40  ALKPHOS 81  BILITOT 0.8  PROT 9.0*  ALBUMIN 4.9   Recent Labs  Lab 06/27/21 1237  LIPASE 20   No results for input(s): AMMONIA in the last 168 hours. Coagulation Profile: No results for input(s): INR, PROTIME in the last 168 hours. Cardiac Enzymes: No results for input(s): CKTOTAL, CKMB, CKMBINDEX, TROPONINI in the last 168 hours. BNP (last 3 results) No results for input(s): PROBNP in the last 8760 hours. HbA1C: No results for input(s): HGBA1C in the last 72 hours. CBG: Recent Labs  Lab 06/29/21 0759 06/30/21 0818 06/30/21 1114  GLUCAP 74 72 88   Lipid Profile: No results for input(s): CHOL, HDL, LDLCALC, TRIG, CHOLHDL, LDLDIRECT in the last 72 hours. Thyroid Function Tests: No results for input(s): TSH, T4TOTAL, FREET4, T3FREE, THYROIDAB in the last 72 hours. Anemia Panel: No results for input(s): VITAMINB12, FOLATE, FERRITIN, TIBC, IRON, RETICCTPCT in the last 72 hours. Sepsis Labs: No results for input(s): PROCALCITON, LATICACIDVEN in the last 168 hours.  Recent Results (from the past 240 hour(s))  Resp Panel by RT-PCR (Flu A&B, Covid) Nasopharyngeal Swab     Status: None   Collection Time: 06/27/21 11:22 PM   Specimen: Nasopharyngeal Swab; Nasopharyngeal(NP) swabs in vial transport medium  Result Value Ref Range Status   SARS Coronavirus 2 by RT PCR NEGATIVE NEGATIVE Final    Comment: (NOTE) SARS-CoV-2 target nucleic acids are NOT DETECTED.  The SARS-CoV-2 RNA is generally detectable in upper respiratory specimens during the acute phase of infection. The lowest concentration of SARS-CoV-2 viral copies this assay can detect is 138 copies/mL. A negative result does not preclude SARS-Cov-2 infection and should not be used as  the sole basis for treatment or other patient management decisions. A negative result may occur with  improper specimen collection/handling, submission of specimen other than nasopharyngeal swab, presence of viral mutation(s) within the areas targeted by this assay, and inadequate number of viral copies(<138 copies/mL). A negative result must be combined with clinical observations, patient history, and epidemiological information. The expected result is Negative.  Fact Sheet for Patients:  06/29/21  Fact Sheet for Healthcare Providers:  BloggerCourse.com  This test is no t yet approved or cleared by the [place] FDA and  has been authorized for detection and/or diagnosis of SARS-CoV-2 by FDA under an Emergency Use Authorization (EUA). This EUA will remain  in effect (meaning this test can be used) for the duration of the COVID-19 declaration under Section 564(b)(1) of the Act, 21 U.S.C.section 360bbb-3(b)(1), unless the authorization is terminated  or revoked sooner.       Influenza A by PCR NEGATIVE NEGATIVE Final   Influenza B by PCR NEGATIVE NEGATIVE  Final    Comment: (NOTE) The Xpert Xpress SARS-CoV-2/FLU/RSV plus assay is intended as an aid in the diagnosis of influenza from Nasopharyngeal swab specimens and should not be used as a sole basis for treatment. Nasal washings and aspirates are unacceptable for Xpert Xpress SARS-CoV-2/FLU/RSV testing.  Fact Sheet for Patients: BloggerCourse.com  Fact Sheet for Healthcare Providers: [employer]ious[employer]ker.it  This test is not yet approved or cleared by the [place] FDA and has been authorized for detection and/or diagnosis of SARS-CoV-2 by FDA under an Emergency Use Authorization (EUA). This EUA will remain in effect (meaning this test can be used) for the duration of the COVID-19 declaration under Section 564(b)(1) of  the Act, 21 U.S.C. section 360bbb-3(b)(1), unless the authorization is terminated or revoked.  Performed at [facility], [address] [place], [place] [address]   Resp Panel by RT-PCR (Flu A&B, Covid) Nasopharyngeal Swab     Status: None   Collection Time: 06/28/21  2:25 PM   Specimen: Nasopharyngeal Swab; Nasopharyngeal(NP) swabs in vial transport medium  Result Value Ref Range Status   SARS Coronavirus 2 by RT PCR NEGATIVE NEGATIVE Final    Comment: (NOTE) SARS-CoV-2 target nucleic acids are NOT DETECTED.  The SARS-CoV-2 RNA is generally detectable in upper respiratory specimens during the acute phase of infection. The lowest concentration of SARS-CoV-2 viral copies this assay can detect is 138 copies/mL. A negative result does not preclude SARS-Cov-2 infection and should not be used as the sole basis for treatment or other patient management decisions. A negative result may occur with  improper specimen collection/handling, submission of specimen other than nasopharyngeal swab, presence of viral mutation(s) within the areas targeted by this assay, and inadequate number of viral copies(<138 copies/mL). A negative result must be combined with clinical observations, patient history, and epidemiological information. The expected result is Negative.  Fact Sheet for Patients:  BloggerCourse.com  Fact Sheet for Healthcare Providers:  [employer]ious[employer]ker.it  This test is no t yet approved or cleared by the [place] FDA and  has been authorized for detection and/or diagnosis of SARS-CoV-2 by FDA under an Emergency Use Authorization (EUA). This EUA will remain  in effect (meaning this test can be used) for the duration of the COVID-19 declaration under Section 564(b)(1) of the Act, 21 U.S.C.section 360bbb-3(b)(1), unless the authorization is terminated  or revoked sooner.       Influenza A by PCR NEGATIVE NEGATIVE  Final   Influenza B by PCR NEGATIVE NEGATIVE Final    Comment: (NOTE) The Xpert Xpress SARS-CoV-2/FLU/RSV plus assay is intended as an aid in the diagnosis of influenza from Nasopharyngeal swab specimens and should not be used as a sole basis for treatment. Nasal washings and aspirates are unacceptable for Xpert Xpress SARS-CoV-2/FLU/RSV testing.  Fact Sheet for Patients: BloggerCourse.com  Fact Sheet for Healthcare Providers: [employer]ious[employer]ker[employer]it  This test is not yet approved or cleared by the [place] FDA and has been authorized for detection and/or diagnosis of SARS-CoV-2 by FDA under an Emergency Use Authorization (EUA). This EUA will remain in effect (meaning this test can be used) for the duration of the COVID-19 declaration under Section 564(b)(1) of the Act, 21 U.S.C. section 360bbb-3(b)(1), unless the authorization is terminated or revoked.  Performed at [facility], [address] [place], [place] [address]   MRSA Next Gen by PCR, Nasal     Status: None   Collection Time: 06/30/21 11:19 AM   Specimen: Nasal Mucosa; Nasal Swab  Result Value Ref  Range Status   MRSA by PCR Next Gen NOT DETECTED NOT DETECTED Final    Comment: (NOTE) The GeneXpert MRSA Assay (FDA approved for NASAL specimens only), is one component of a comprehensive MRSA colonization surveillance program. It is not intended to diagnose MRSA infection nor to guide or monitor treatment for MRSA infections. Test performance is not FDA approved in patients less than 61 years old. Performed at [facility], [address], [place], [place] [address]          Radiology Studies: ECHOCARDIOGRAM COMPLETE  Result Date: 06/30/2021    ECHOCARDIOGRAM REPORT   Patient Name:   HANIYAH MACIOLEK Date of Exam: 06/30/2021 Medical Rec #:  [id number]      Height:       61.0 in Accession #:    [id number]     Weight:       105.0 lb Date of Birth:   1980/10/27     BSA:          1.436 m Patient Age:    40 years       BP:           146/101 mmHg Patient Gender: F              HR:           93 bpm. Exam Location:  [facility] Procedure: 2D Echo, Cardiac Doppler and Color Doppler Indications:     ventricular tachycardia I47.2  History:         Patient has no prior history of Echocardiogram examinations.                  ETOH abuse, Tobacco abuse, NSVT.  Sonographer:     Cristela Blue Referring Phys:  [id number] BERGE Diagnosing Phys: Lorine Bears MD  Sonographer Comments: Suboptimal apical window. Image acquisition challenging due to patient body habitus. IMPRESSIONS  1. Left ventricular ejection fraction, by estimation, is 50 to 55%. The left ventricle has low normal function. Left ventricular endocardial border not optimally defined to evaluate regional wall motion. Left ventricular diastolic parameters are indeterminate.  2. Accurate EF evaluation and wall motion assesment is limited by frequent PVCs.  3. Right ventricular systolic function is normal. The right ventricular size is normal. Tricuspid regurgitation signal is inadequate for assessing PA pressure.  4. The mitral valve is normal in structure. No evidence of mitral valve regurgitation. No evidence of mitral stenosis.  5. The aortic valve is normal in structure. Aortic valve regurgitation is not visualized. No aortic stenosis is present.  6. The inferior vena cava is normal in size with greater than 50% respiratory variability, suggesting right atrial pressure of 3 mmHg. FINDINGS  Left Ventricle: Left ventricular ejection fraction, by estimation, is 50 to 55%. The left ventricle has low normal function. Left ventricular endocardial border not optimally defined to evaluate regional wall motion. The left ventricular internal cavity  size was normal in size. There is no left ventricular hypertrophy. Left ventricular diastolic parameters are indeterminate. Right Ventricle: The right ventricular size is  normal. No increase in right ventricular wall thickness. Right ventricular systolic function is normal. Tricuspid regurgitation signal is inadequate for assessing PA pressure. The tricuspid regurgitant velocity is 2.08 m/s, and with an assumed right atrial pressure of 3 mmHg, the estimated right ventricular systolic pressure is 20.3 mmHg. Left Atrium: Left atrial size was normal in size. Right Atrium: Right atrial size was normal in size. Pericardium: There is no evidence of  pericardial effusion. Mitral Valve: The mitral valve is normal in structure. No evidence of mitral valve regurgitation. No evidence of mitral valve stenosis. Tricuspid Valve: The tricuspid valve is normal in structure. Tricuspid valve regurgitation is not demonstrated. No evidence of tricuspid stenosis. Aortic Valve: The aortic valve is normal in structure. Aortic valve regurgitation is not visualized. No aortic stenosis is present. Aortic valve mean gradient measures 2.0 mmHg. Aortic valve peak gradient measures 3.6 mmHg. Aortic valve area, by VTI measures 3.97 cm. Pulmonic Valve: The pulmonic valve was normal in structure. Pulmonic valve regurgitation is not visualized. No evidence of pulmonic stenosis. Aorta: The aortic root is normal in size and structure. Venous: The inferior vena cava is normal in size with greater than 50% respiratory variability, suggesting right atrial pressure of 3 mmHg. IAS/Shunts: No atrial level shunt detected by color flow Doppler.  LEFT VENTRICLE PLAX 2D LVIDd:         3.50 cm   Diastology LVIDs:         2.40 cm   LV e' medial:   5.22 cm/s LV PW:         1.20 cm   LV E/e' medial: 17.0 LV IVS:        0.80 cm LVOT diam:     2.00 cm LV SV:         45 LV SV Index:   31 LVOT Area:     3.14 cm  LEFT ATRIUM             Index        RIGHT ATRIUM          Index LA diam:        1.90 cm 1.32 cm/m   RA Area:     9.64 cm LA Vol (A2C):   27.6 ml 19.22 ml/m  RA Volume:   19.70 ml 13.72 ml/m LA Vol (A4C):   24.8 ml 17.27  ml/m LA Biplane Vol: 26.7 ml 18.59 ml/m  AORTIC VALVE                    PULMONIC VALVE AV Area (Vmax):    2.82 cm     PV Vmax:        0.52 m/s AV Area (Vmean):   2.46 cm     PV Vmean:       37.000 cm/s AV Area (VTI):     3.97 cm     PV VTI:         0.093 m AV Vmax:           94.70 cm/s   PV Peak grad:   1.1 mmHg AV Vmean:          65.700 cm/s  PV Mean grad:   1.0 mmHg AV VTI:            0.114 m      RVOT Peak grad: 2 mmHg AV Peak Grad:      3.6 mmHg AV Mean Grad:      2.0 mmHg LVOT Vmax:         84.90 cm/s LVOT Vmean:        51.400 cm/s LVOT VTI:          0.144 m LVOT/AV VTI ratio: 1.26  AORTA Ao Root diam: 2.63 cm MITRAL VALVE               TRICUSPID VALVE MV Area (PHT): 3.83 cm    TR Peak grad:  17.3 mmHg MV Decel Time: 198 msec    TR Vmax:        208.00 cm/s MV E velocity: 88.70 cm/s MV A velocity: 69.00 cm/s  SHUNTS MV E/A ratio:  1.29        Systemic VTI:  0.14 m                            Systemic Diam: 2.00 cm                            Pulmonic VTI:  0.097 m Lorine Bears MD Electronically signed by Lorine Bears MD Signature Date/Time: 06/30/2021/1:39:32 PM    Final         Scheduled Meds:  Chlorhexidine Gluconate Cloth  6 each Topical Daily   folic acid  1 mg Oral Daily   metoprolol tartrate  12.5 mg Oral Q6H   multivitamin with minerals  1 tablet Oral Daily   potassium chloride  40 mEq Oral BID   thiamine  100 mg Oral Daily   Or   thiamine  100 mg Intravenous Daily   Continuous Infusions:  lactated ringers 125 mL/hr at 06/30/21 1150     LOS: 1 day    Time spent: 25 minutes    Dorcas Carrow, MD [facility] Pager [phone]

## 2021-06-30 NOTE — Progress Notes (Signed)
*  PRELIMINARY RESULTS* Echocardiogram 2D Echocardiogram has been performed.  Cristela Blue 06/30/2021, 11:49 AM

## 2021-06-30 NOTE — Significant Event (Signed)
Patient's nurse notified me that patient has had runs of torsades.  Patient is asymptomatic.  Gave magnesium sulfate 2 g IV following which patient had another run of torsades.  Reviewed patient's stat metabolic panel and magnesium levels.  Potassium was 4 and magnesium was 1.7.  I ordered IV KCl to keep potassium more than 4 and also another dose of magnesium sulfate 2 g IV.  Discussed with pharmacy and we are at this time discontinuing medications which can potentially potentiate torsades including Protonix, Phenergan and nicotine patch.  We will check an EKG troponins repeat metabolic panel in few hours.  Closely monitor in telemetry.  Patient's nurse updated about the plan.  Midge Minium

## 2021-07-01 ENCOUNTER — Inpatient Hospital Stay (HOSPITAL_COMMUNITY)
Admit: 2021-07-01 | Discharge: 2021-07-01 | Disposition: A | Discharge status: Home / Self Care | Payer: Medicaid Other | Attending: Nurse Practitioner | Admitting: Nurse Practitioner

## 2021-07-01 DIAGNOSIS — Z4509 Encounter for adjustment and management of other cardiac device: Secondary | ICD-10-CM

## 2021-07-01 DIAGNOSIS — I4729 Other ventricular tachycardia: Secondary | ICD-10-CM

## 2021-07-01 LAB — CBC WITH DIFFERENTIAL/PLATELET
Abs Immature Granulocytes: 0.02 10*3/uL (ref 0.00–0.07)
Basophils Absolute: 0 10*3/uL (ref 0.0–0.1)
Basophils Relative: 0 %
Eosinophils Absolute: 0 10*3/uL (ref 0.0–0.5)
Eosinophils Relative: 0 %
HCT: 32.2 % — ABNORMAL LOW (ref 36.0–46.0)
Hemoglobin: 11 g/dL — ABNORMAL LOW (ref 12.0–15.0)
Immature Granulocytes: 1 %
Lymphocytes Relative: 32 %
Lymphs Abs: 0.8 10*3/uL (ref 0.7–4.0)
MCH: 28.1 pg (ref 26.0–34.0)
MCHC: 34.2 g/dL (ref 30.0–36.0)
MCV: 82.4 fL (ref 80.0–100.0)
Monocytes Absolute: 0.6 10*3/uL (ref 0.1–1.0)
Monocytes Relative: 22 %
Neutro Abs: 1.1 10*3/uL — ABNORMAL LOW (ref 1.7–7.7)
Neutrophils Relative %: 45 %
Platelets: 108 10*3/uL — ABNORMAL LOW (ref 150–400)
RBC: 3.91 MIL/uL (ref 3.87–5.11)
RDW: 15.8 % — ABNORMAL HIGH (ref 11.5–15.5)
WBC: 2.5 10*3/uL — ABNORMAL LOW (ref 4.0–10.5)
nRBC: 0 % (ref 0.0–0.2)

## 2021-07-01 LAB — COMPREHENSIVE METABOLIC PANEL
ALT: 22 U/L (ref 0–44)
AST: 39 U/L (ref 15–41)
Albumin: 3.2 g/dL — ABNORMAL LOW (ref 3.5–5.0)
Alkaline Phosphatase: 49 U/L (ref 38–126)
Anion gap: 5 (ref 5–15)
BUN: 6 mg/dL (ref 6–20)
CO2: 27 mmol/L (ref 22–32)
Calcium: 8.3 mg/dL — ABNORMAL LOW (ref 8.9–10.3)
Chloride: 99 mmol/L (ref 98–111)
Creatinine, Ser: 0.61 mg/dL (ref 0.44–1.00)
GFR, Estimated: >60 mL/min (ref >60)
Glucose, Bld: 94 mg/dL (ref 70–99)
Potassium: 3.8 mmol/L (ref 3.5–5.1)
Sodium: 131 mmol/L — ABNORMAL LOW (ref 135–145)
Total Bilirubin: 1 mg/dL (ref 0.3–1.2)
Total Protein: 5.9 g/dL — ABNORMAL LOW (ref 6.5–8.1)

## 2021-07-01 LAB — MAGNESIUM: Magnesium: 1.6 mg/dL — ABNORMAL LOW (ref 1.7–2.4)

## 2021-07-01 LAB — PHOSPHORUS: Phosphorus: 2.1 mg/dL — ABNORMAL LOW (ref 2.5–4.6)

## 2021-07-01 MED ORDER — METOPROLOL TARTRATE 50 MG PO TABS
50.0000 mg | ORAL_TABLET | Freq: Two times a day (BID) | ORAL | Status: DC
  Administered 2021-07-01: 10:00:00 50 mg via ORAL
  Filled 2021-07-01: qty 1

## 2021-07-01 MED ORDER — MAGNESIUM OXIDE -MG SUPPLEMENT 400 (240 MG) MG PO TABS: 400.0000 mg | ORAL_TABLET | Freq: Two times a day (BID) | ORAL | Status: DC

## 2021-07-01 MED ORDER — ADULT MULTIVITAMIN W/MINERALS CH: 1.0000 | ORAL_TABLET | Freq: Every day | ORAL | 0 refills | Status: AC

## 2021-07-01 MED ORDER — FOLIC ACID 1 MG PO TABS: 1.0000 mg | ORAL_TABLET | Freq: Every day | ORAL | 0 refills | Status: AC

## 2021-07-01 MED ORDER — MAGNESIUM OXIDE -MG SUPPLEMENT 400 (240 MG) MG PO TABS: 400.0000 mg | ORAL_TABLET | Freq: Two times a day (BID) | ORAL | 0 refills | Status: AC

## 2021-07-01 MED ORDER — METOPROLOL TARTRATE 50 MG PO TABS: 50.0000 mg | ORAL_TABLET | Freq: Two times a day (BID) | ORAL | 0 refills | Status: DC

## 2021-07-01 MED ORDER — POTASSIUM CHLORIDE CRYS ER 20 MEQ PO TBCR: 20.0000 meq | EXTENDED_RELEASE_TABLET | Freq: Every day | ORAL | 0 refills | Status: DC

## 2021-07-01 MED ORDER — MAGNESIUM SULFATE 2 GM/50ML IV SOLN
2.0000 g | Freq: Once | INTRAVENOUS | Status: AC
  Administered 2021-07-01: 09:00:00 2 g via INTRAVENOUS
  Filled 2021-07-01: qty 50

## 2021-07-01 NOTE — Progress Notes (Signed)
Pt discharged.  Discharged orders placed, AVS printed and given to patient.  All questions addressed.  Follow up appts. Made and given to pt.  PIV removed.

## 2021-07-01 NOTE — Discharge Summary (Signed)
Physician Discharge Summary  OSHYN METRO DJM:[id number] DOB: 05/11/1981 DOA: 06/28/2021  PCP: [facility]  Admit date: 06/28/2021 Discharge date: 07/01/2021  Admitted From: Home Disposition: Home  Recommendations for Outpatient Follow-up:  Follow up with PCP in 1-2 weeks Please obtain BMP/CBC/magnesium/phosphorus in 1 week Cardiology will schedule follow-up.  Continue to wear the monitor provided in the hospital.  Home Health: N/A Equipment/Devices: N/A  Discharge Condition: Stable CODE STATUS: Full code Diet recommendation: Regular diet  Discharge summary: 40 year old with history of smoking, ongoing alcohol use, asthma and GERD presented with intractable nausea vomiting of several days.  Also reported streaks of blood with vomiting.  Recently had upper GI endoscopy and found to have esophageal ulcerations due to reflux.  In the emergency room hemodynamically stable. CT scan of the abdomen pelvis normal.  Lipase normal.  Seen in the emergency room and sent back home however could not eat so came back to ER. Patient was admitted to hospital and treated with IV fluids nausea medications with good clinical response.  She was clinically improving, however on the night of 12/19 patient was noted to have multiple NSVT with occasional feeling of nausea and dizziness and palpitations.  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  - Abnormal heart rhythm/frequent SVTs: Discontinued all QT prolonging medications) on nausea medicine, Protonix, Remeron. Electrolytes were aggressively replaced and normalized. Echocardiogram was essentially normal. Seen by cardiology team.  Treated with lidocaine infusion overnight with improvement of symptoms. Started on metoprolol 50 mg twice daily. Frequent polymorphic NSVT's were thought to be secondary to prolonged QT interval. Patient had adequate clinical improvement today and was asymptomatic.  Sinus rhythm after  tapering off lidocaine.  Patient needed to go home to take care of her teenager daughter and was not able to stay in the hospital for further monitoring. Discharging with metoprolol 50 mg twice daily Scheduled doses of magnesium and potassium replacement to avoid dropping levels. Patient was fitted with Zio patch for ambulatory monitoring and she will follow-up with cardiology office.    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  Currently eating regular diet. Avoid all nausea medications and PPI.  Avoid alcohol and drugs.  No evidence of alcohol withdrawal.   Hypokalemia, replaced.  Adequate Hypomagnesemia, replaced. Hypophosphatemia, replaced.   Smoker: Counseled to quit.     Patient with adequate clinical improvement today. She was advised to be monitored in the hospital.  Patient had to go home today.  Advised to monitor for any evidence of chest pain or palpitations or dizziness and report immediately. She has been eating regular diet and remained in normal sinus rhythm since she was taken off the lidocaine infusion.   Discharge Instructions  Discharge Instructions     Call MD for:  difficulty breathing, headache or visual disturbances   Complete by: As directed    Call MD for:  extreme fatigue   Complete by: As directed    Call MD for:  persistant dizziness or light-headedness   Complete by: As directed    Call MD for:  persistant nausea and vomiting   Complete by: As directed    Diet general   Complete by: As directed    Do not drink alcohol. Do not use any nausea medications without discussing with your doctor.   Increase activity slowly   Complete by: As directed       Allergies as of 07/01/2021   No Known Allergies  Medication List     STOP taking these medications    ibuprofen 800 MG tablet Commonly known as: ADVIL   mirtazapine 15 MG tablet Commonly known as: REMERON   ondansetron 4 MG  tablet Commonly known as: Zofran   pantoprazole 40 MG tablet Commonly known as: PROTONIX   promethazine 12.5 MG tablet Commonly known as: PHENERGAN   promethazine 25 MG suppository Commonly known as: PHENERGAN       TAKE these medications    folic acid 1 MG tablet Commonly known as: FOLVITE Take 1 tablet (1 mg total) by mouth daily. Start taking on: July 02, 2021   magnesium oxide 400 (240 Mg) MG tablet Commonly known as: MAG-OX Take 1 tablet (400 mg total) by mouth 2 (two) times daily. Start taking on: July 02, 2021   metoprolol tartrate 50 MG tablet Commonly known as: LOPRESSOR Take 1 tablet (50 mg total) by mouth 2 (two) times daily.   multivitamin with minerals Tabs tablet Take 1 tablet by mouth daily. Start taking on: July 02, 2021   potassium chloride SA 20 MEQ tablet Commonly known as: KLOR-CON M Take 1 tablet (20 mEq total) by mouth daily.        Follow-up Information     [facility] Follow up in 2 week(s).   Contact information: [address] [place] [address] [phone]         Iran Ouch, MD .   Specialty: Cardiology Contact information: [address] [place] [address] [phone]                No Known Allergies  Consultations: Cardiology   Procedures/Studies: CT ABDOMEN PELVIS W CONTRAST  Result Date: 06/27/2021 CLINICAL DATA:  Nausea and vomiting EXAM: CT ABDOMEN AND PELVIS WITH CONTRAST TECHNIQUE: Multidetector CT imaging of the abdomen and pelvis was performed using the standard protocol following bolus administration of intravenous contrast. CONTRAST:  76mL OMNIPAQUE IOHEXOL 300 MG/ML  SOLN COMPARISON:  None. FINDINGS: Lower chest: No acute abnormality. Hepatobiliary: Mild fatty infiltration of the liver is noted. The gallbladder appears within normal limits. Pancreas: Unremarkable. No pancreatic ductal dilatation or surrounding inflammatory changes.  Spleen: Normal in size without focal abnormality. Adrenals/Urinary Tract: Adrenal glands are within normal limits. Kidneys demonstrate a normal enhancement pattern bilaterally. No renal calculi or obstructive changes are noted. The ureters are within normal limits bilaterally. The bladder is decompressed. Stomach/Bowel: Distal colon is decompressed. No obstructive changes are seen. The appendix is within normal limits. Small bowel appears within normal limits. Stomach shows a small sliding-type hiatal hernia. Vascular/Lymphatic: Abdominal aorta is within normal limits. Prominent ovarian vein is noted on the left with prominent periuterine veins. Similar prominent periuterine veins are noted on the right draining into the internal iliac vein on the right. No sizable lymphadenopathy is noted. Reproductive: Uterus is within normal limits. Prominent periuterine veins are seen. No adnexal abnormality is seen. Other: No abdominal wall hernia or abnormality. No abdominopelvic ascites. Musculoskeletal: No acute or significant osseous findings. IMPRESSION: No acute abnormality to correspond with the patient's given clinical history. Prominent periuterine veins bilaterally as described. These may represent some changes of pelvic congestion. No other focal abnormality is noted. Electronically Signed   By: Alcide Clever M.D.   On: 06/27/2021 23:15   ECHOCARDIOGRAM COMPLETE  Result Date: 06/30/2021    ECHOCARDIOGRAM REPORT   Patient Name:   Yvette [name] Date of Exam: 06/30/2021 Medical Rec #:  [id number]  Height:       61.0 in Accession #:    [id number]     Weight:       105.0 lb Date of Birth:  1981-02-18     BSA:          1.436 m Patient Age:    40 years       BP:           146/101 mmHg Patient Gender: F              HR:           93 bpm. Exam Location:  [facility] Procedure: 2D Echo, Cardiac Doppler and Color Doppler Indications:     ventricular tachycardia I47.2  History:         Patient has no prior history of  Echocardiogram examinations.                  ETOH abuse, Tobacco abuse, NSVT.  Sonographer:     Cristela Blue Referring Phys:  [id number] BERGE Diagnosing Phys: Lorine Bears MD  Sonographer Comments: Suboptimal apical window. Image acquisition challenging due to patient body habitus. IMPRESSIONS  1. Left ventricular ejection fraction, by estimation, is 50 to 55%. The left ventricle has low normal function. Left ventricular endocardial border not optimally defined to evaluate regional wall motion. Left ventricular diastolic parameters are indeterminate.  2. Accurate EF evaluation and wall motion assesment is limited by frequent PVCs.  3. Right ventricular systolic function is normal. The right ventricular size is normal. Tricuspid regurgitation signal is inadequate for assessing PA pressure.  4. The mitral valve is normal in structure. No evidence of mitral valve regurgitation. No evidence of mitral stenosis.  5. The aortic valve is normal in structure. Aortic valve regurgitation is not visualized. No aortic stenosis is present.  6. The inferior vena cava is normal in size with greater than 50% respiratory variability, suggesting right atrial pressure of 3 mmHg. FINDINGS  Left Ventricle: Left ventricular ejection fraction, by estimation, is 50 to 55%. The left ventricle has low normal function. Left ventricular endocardial border not optimally defined to evaluate regional wall motion. The left ventricular internal cavity  size was normal in size. There is no left ventricular hypertrophy. Left ventricular diastolic parameters are indeterminate. Right Ventricle: The right ventricular size is normal. No increase in right ventricular wall thickness. Right ventricular systolic function is normal. Tricuspid regurgitation signal is inadequate for assessing PA pressure. The tricuspid regurgitant velocity is 2.08 m/s, and with an assumed right atrial pressure of 3 mmHg, the estimated right ventricular systolic  pressure is 20.3 mmHg. Left Atrium: Left atrial size was normal in size. Right Atrium: Right atrial size was normal in size. Pericardium: There is no evidence of pericardial effusion. Mitral Valve: The mitral valve is normal in structure. No evidence of mitral valve regurgitation. No evidence of mitral valve stenosis. Tricuspid Valve: The tricuspid valve is normal in structure. Tricuspid valve regurgitation is not demonstrated. No evidence of tricuspid stenosis. Aortic Valve: The aortic valve is normal in structure. Aortic valve regurgitation is not visualized. No aortic stenosis is present. Aortic valve mean gradient measures 2.0 mmHg. Aortic valve peak gradient measures 3.6 mmHg. Aortic valve area, by VTI measures 3.97 cm. Pulmonic Valve: The pulmonic valve was normal in structure. Pulmonic valve regurgitation is not visualized. No evidence of pulmonic stenosis. Aorta: The aortic root is normal in size and structure. Venous: The inferior vena cava is normal in size with greater  than 50% respiratory variability, suggesting right atrial pressure of 3 mmHg. IAS/Shunts: No atrial level shunt detected by color flow Doppler.  LEFT VENTRICLE PLAX 2D LVIDd:         3.50 cm   Diastology LVIDs:         2.40 cm   LV e' medial:   5.22 cm/s LV PW:         1.20 cm   LV E/e' medial: 17.0 LV IVS:        0.80 cm LVOT diam:     2.00 cm LV SV:         45 LV SV Index:   31 LVOT Area:     3.14 cm  LEFT ATRIUM             Index        RIGHT ATRIUM          Index LA diam:        1.90 cm 1.32 cm/m   RA Area:     9.64 cm LA Vol (A2C):   27.6 ml 19.22 ml/m  RA Volume:   19.70 ml 13.72 ml/m LA Vol (A4C):   24.8 ml 17.27 ml/m LA Biplane Vol: 26.7 ml 18.59 ml/m  AORTIC VALVE                    PULMONIC VALVE AV Area (Vmax):    2.82 cm     PV Vmax:        0.52 m/s AV Area (Vmean):   2.46 cm     PV Vmean:       37.000 cm/s AV Area (VTI):     3.97 cm     PV VTI:         0.093 m AV Vmax:           94.70 cm/s   PV Peak grad:   1.1 mmHg  AV Vmean:          65.700 cm/s  PV Mean grad:   1.0 mmHg AV VTI:            0.114 m      RVOT Peak grad: 2 mmHg AV Peak Grad:      3.6 mmHg AV Mean Grad:      2.0 mmHg LVOT Vmax:         84.90 cm/s LVOT Vmean:        51.400 cm/s LVOT VTI:          0.144 m LVOT/AV VTI ratio: 1.26  AORTA Ao Root diam: 2.63 cm MITRAL VALVE               TRICUSPID VALVE MV Area (PHT): 3.83 cm    TR Peak grad:   17.3 mmHg MV Decel Time: 198 msec    TR Vmax:        208.00 cm/s MV E velocity: 88.70 cm/s MV A velocity: 69.00 cm/s  SHUNTS MV E/A ratio:  1.29        Systemic VTI:  0.14 m                            Systemic Diam: 2.00 cm                            Pulmonic VTI:  0.097 m Lorine Bears MD Electronically signed by Lorine Bears MD Signature Date/Time: 06/30/2021/1:39:32  PM    Final    US ABDOMEN LIMITED RUQ (LIVER/GB)  Result Date: 06/05/2021 CLINICAL DATA:  Elevated LFTs EXAM: ULTRASOUND ABDOMEN LIMITED RIGHT UPPER QUADRANT COMPARISON:  Abdominal ultrasound 09/29/2019 FINDINGS: Gallbladder: No gallstones or wall thickening visualized. No sonographic Murphy sign noted by sonographer. Common bile duct: Diameter: 2 mm Liver: No focal lesion identified. Parenchymal echogenicity is increased consistent with mild fatty infiltration. Portal vein is patent on color Doppler imaging with normal direction of blood flow towards the liver. Other: None. IMPRESSION: Mild fatty infiltration of the liver, otherwise unremarkable right upper quadrant ultrasound. Electronically Signed   By: Lesia Hausen M.D.   On: 06/05/2021 07:16   (Echo, Carotid, EGD, Colonoscopy, ERCP)    Subjective: Patient seen and examined.  Denies any complaints.  She was eating her breakfast.  She tells me that her daughter is alone at home and she has to go home.  We advised her to wear the Zio patch and she was agreeable.  Also agreeable to take medications.   Discharge Exam: Vitals:   07/01/21 0900 07/01/21 1230  BP: (!) 134/96 (!) 141/89  Pulse: 78 76   Resp: 14 14  Temp:    SpO2: 100% 100%   Vitals:   07/01/21 0500 07/01/21 0800 07/01/21 0900 07/01/21 1230  BP: (!) 126/100 118/78 (!) 134/96 (!) 141/89  Pulse: 71 88 78 76  Resp: Temp:  97.6 F (36.4 C)    TempSrc:  Oral    SpO2: 99% 100% 100% 100%  Weight:      Height:        General: Pt is alert, awake, not in acute distress Flat affect.  Slightly anxious.  On room air.  Walking around. Cardiovascular: RRR, S1/S2 +, no rubs, no gallops Respiratory: CTA bilaterally, no wheezing, no rhonchi Abdominal: Soft, NT, ND, bowel sounds + Extremities: no edema, no cyanosis    The results of significant diagnostics from this hospitalization (including imaging, microbiology, ancillary and laboratory) are listed below for reference.     Microbiology: Recent Results (from the past 240 hour(s))  Resp Panel by RT-PCR (Flu A&B, Covid) Nasopharyngeal Swab     Status: None   Collection Time: 06/27/21 11:22 PM   Specimen: Nasopharyngeal Swab; Nasopharyngeal(NP) swabs in vial transport medium  Result Value Ref Range Status   SARS Coronavirus 2 by RT PCR NEGATIVE NEGATIVE Final    Comment: (NOTE) SARS-CoV-2 target nucleic acids are NOT DETECTED.  The SARS-CoV-2 RNA is generally detectable in upper respiratory specimens during the acute phase of infection. The lowest concentration of SARS-CoV-2 viral copies this assay can detect is 138 copies/mL. A negative result does not preclude SARS-Cov-2 infection and should not be used as the sole basis for treatment or other patient management decisions. A negative result may occur with  improper specimen collection/handling, submission of specimen other than nasopharyngeal swab, presence of viral mutation(s) within the areas targeted by this assay, and inadequate number of viral copies(<138 copies/mL). A negative result must be combined with clinical observations, patient history, and epidemiological information. The expected result  is Negative.  Fact Sheet for Patients:  BloggerCourse.com  Fact Sheet for Healthcare Providers:  SeriousBroker.it  This test is no t yet approved or cleared by the [place] FDA and  has been authorized for detection and/or diagnosis of SARS-CoV-2 by FDA under an Emergency Use Authorization (EUA). This EUA will remain  in effect (meaning this test can be used) for the duration  of the COVID-19 declaration under Section 564(b)(1) of the Act, 21 U.S.C.section 360bbb-3(b)(1), unless the authorization is terminated  or revoked sooner.       Influenza A by PCR NEGATIVE NEGATIVE Final   Influenza B by PCR NEGATIVE NEGATIVE Final    Comment: (NOTE) The Xpert Xpress SARS-CoV-2/FLU/RSV plus assay is intended as an aid in the diagnosis of influenza from Nasopharyngeal swab specimens and should not be used as a sole basis for treatment. Nasal washings and aspirates are unacceptable for Xpert Xpress SARS-CoV-2/FLU/RSV testing.  Fact Sheet for Patients: BloggerCourse.com  Fact Sheet for Healthcare Providers: SeriousBroker.it  This test is not yet approved or cleared by the [place] FDA and has been authorized for detection and/or diagnosis of SARS-CoV-2 by FDA under an Emergency Use Authorization (EUA). This EUA will remain in effect (meaning this test can be used) for the duration of the COVID-19 declaration under Section 564(b)(1) of the Act, 21 U.S.C. section 360bbb-3(b)(1), unless the authorization is terminated or revoked.  Performed at [facility], [address] [place], [place] [address]   Resp Panel by RT-PCR (Flu A&B, Covid) Nasopharyngeal Swab     Status: None   Collection Time: 06/28/21  2:25 PM   Specimen: Nasopharyngeal Swab; Nasopharyngeal(NP) swabs in vial transport medium  Result Value Ref Range Status   SARS Coronavirus 2 by RT PCR NEGATIVE  NEGATIVE Final    Comment: (NOTE) SARS-CoV-2 target nucleic acids are NOT DETECTED.  The SARS-CoV-2 RNA is generally detectable in upper respiratory specimens during the acute phase of infection. The lowest concentration of SARS-CoV-2 viral copies this assay can detect is 138 copies/mL. A negative result does not preclude SARS-Cov-2 infection and should not be used as the sole basis for treatment or other patient management decisions. A negative result may occur with  improper specimen collection/handling, submission of specimen other than nasopharyngeal swab, presence of viral mutation(s) within the areas targeted by this assay, and inadequate number of viral copies(<138 copies/mL). A negative result must be combined with clinical observations, patient history, and epidemiological information. The expected result is Negative.  Fact Sheet for Patients:  BloggerCourse.com  Fact Sheet for Healthcare Providers:  SeriousBroker.it  This test is no t yet approved or cleared by the [place] FDA and  has been authorized for detection and/or diagnosis of SARS-CoV-2 by FDA under an Emergency Use Authorization (EUA). This EUA will remain  in effect (meaning this test can be used) for the duration of the COVID-19 declaration under Section 564(b)(1) of the Act, 21 U.S.C.section 360bbb-3(b)(1), unless the authorization is terminated  or revoked sooner.       Influenza A by PCR NEGATIVE NEGATIVE Final   Influenza B by PCR NEGATIVE NEGATIVE Final    Comment: (NOTE) The Xpert Xpress SARS-CoV-2/FLU/RSV plus assay is intended as an aid in the diagnosis of influenza from Nasopharyngeal swab specimens and should not be used as a sole basis for treatment. Nasal washings and aspirates are unacceptable for Xpert Xpress SARS-CoV-2/FLU/RSV testing.  Fact Sheet for Patients: BloggerCourse.com  Fact Sheet for Healthcare  Providers: SeriousBroker.it  This test is not yet approved or cleared by the [place] FDA and has been authorized for detection and/or diagnosis of SARS-CoV-2 by FDA under an Emergency Use Authorization (EUA). This EUA will remain in effect (meaning this test can be used) for the duration of the COVID-19 declaration under Section 564(b)(1) of the Act, 21 U.S.C. section 360bbb-3(b)(1), unless the authorization is terminated or revoked.  Performed  at [facility], [address] [place], [place] [address]   MRSA Next Gen by PCR, Nasal     Status: None   Collection Time: 06/30/21 11:19 AM   Specimen: Nasal Mucosa; Nasal Swab  Result Value Ref Range Status   MRSA by PCR Next Gen NOT DETECTED NOT DETECTED Final    Comment: (NOTE) The GeneXpert MRSA Assay (FDA approved for NASAL specimens only), is one component of a comprehensive MRSA colonization surveillance program. It is not intended to diagnose MRSA infection nor to guide or monitor treatment for MRSA infections. Test performance is not FDA approved in patients less than 33 years old. Performed at [facility], [address] [place], [place] [address]      Labs: BNP (last 3 results) No results for input(s): BNP in the last 8760 hours. Basic Metabolic Panel: Recent Labs  Lab 06/27/21 1237 06/28/21 1226 06/29/21 0615 06/29/21 2358 06/30/21 1152 07/01/21 0400  NA 140 137 132* 134*  --  131*  K 3.7 3.3* 4.0 4.0 3.5 3.8  CL 102 104 101 96*  --  99  CO2 20* 23 22 27   --  27  GLUCOSE 99 92 83 89  --  94  BUN 6 6 <5* <5*  --  6  CREATININE 0.52 0.54 0.68 0.53  --  0.61  CALCIUM 9.4 9.0 8.4* 9.0  --  8.3*  MG  --  1.7  --  1.7 2.1 1.6*  PHOS  --  2.2*  --   --  3.3 2.1*   Liver Function Tests: Recent Labs  Lab 06/27/21 1237 07/01/21 0400  AST 89* 39  ALT 40 22  ALKPHOS 81 49  BILITOT 0.8 1.0  PROT 9.0* 5.9*  ALBUMIN 4.9 3.2*   Recent Labs  Lab 06/27/21 1237   LIPASE 20   No results for input(s): AMMONIA in the last 168 hours. CBC: Recent Labs  Lab 06/27/21 1237 06/29/21 0615 07/01/21 0400  WBC 7.1 3.6* 2.5*  NEUTROABS  --   --  1.1*  HGB 12.1 10.2* 11.0*  HCT 36.3 30.7* 32.2*  MCV 83.8 84.3 82.4  PLT 137* 87* 108*   Cardiac Enzymes: No results for input(s): CKTOTAL, CKMB, CKMBINDEX, TROPONINI in the last 168 hours. BNP: Invalid input(s): POCBNP CBG: Recent Labs  Lab 06/29/21 0759 06/30/21 0818 06/30/21 1114  GLUCAP 74 72 88   D-Dimer No results for input(s): DDIMER in the last 72 hours. Hgb A1c No results for input(s): HGBA1C in the last 72 hours. Lipid Profile No results for input(s): CHOL, HDL, LDLCALC, TRIG, CHOLHDL, LDLDIRECT in the last 72 hours. Thyroid function studies No results for input(s): TSH, T4TOTAL, T3FREE, THYROIDAB in the last 72 hours.  Invalid input(s): FREET3 Anemia work up No results for input(s): VITAMINB12, FOLATE, FERRITIN, TIBC, IRON, RETICCTPCT in the last 72 hours. Urinalysis    Component Value Date/Time   COLORURINE YELLOW (A) 06/27/2021 2331   APPEARANCEUR CLEAR (A) 06/27/2021 2331   LABSPEC 1.023 06/27/2021 2331   PHURINE 5.0 06/27/2021 2331   GLUCOSEU NEGATIVE 06/27/2021 2331   HGBUR MODERATE (A) 06/27/2021 2331   BILIRUBINUR NEGATIVE 06/27/2021 2331   KETONESUR 80 (A) 06/27/2021 2331   PROTEINUR >=300 (A) 06/27/2021 2331   NITRITE NEGATIVE 06/27/2021 2331   LEUKOCYTESUR NEGATIVE 06/27/2021 2331   Sepsis Labs Invalid input(s): PROCALCITONIN,  WBC,  LACTICIDVEN Microbiology Recent Results (from the past 240 hour(s))  Resp Panel by RT-PCR (Flu A&B, Covid) Nasopharyngeal Swab  Status: None   Collection Time: 06/27/21 11:22 PM   Specimen: Nasopharyngeal Swab; Nasopharyngeal(NP) swabs in vial transport medium  Result Value Ref Range Status   SARS Coronavirus 2 by RT PCR NEGATIVE NEGATIVE Final    Comment: (NOTE) SARS-CoV-2 target nucleic acids are NOT DETECTED.  The  SARS-CoV-2 RNA is generally detectable in upper respiratory specimens during the acute phase of infection. The lowest concentration of SARS-CoV-2 viral copies this assay can detect is 138 copies/mL. A negative result does not preclude SARS-Cov-2 infection and should not be used as the sole basis for treatment or other patient management decisions. A negative result may occur with  improper specimen collection/handling, submission of specimen other than nasopharyngeal swab, presence of viral mutation(s) within the areas targeted by this assay, and inadequate number of viral copies(<138 copies/mL). A negative result must be combined with clinical observations, patient history, and epidemiological information. The expected result is Negative.  Fact Sheet for Patients:  BloggerCourse.com  Fact Sheet for Healthcare Providers:  SeriousBroker.it  This test is no t yet approved or cleared by the [place] FDA and  has been authorized for detection and/or diagnosis of SARS-CoV-2 by FDA under an Emergency Use Authorization (EUA). This EUA will remain  in effect (meaning this test can be used) for the duration of the COVID-19 declaration under Section 564(b)(1) of the Act, 21 U.S.C.section 360bbb-3(b)(1), unless the authorization is terminated  or revoked sooner.       Influenza A by PCR NEGATIVE NEGATIVE Final   Influenza B by PCR NEGATIVE NEGATIVE Final    Comment: (NOTE) The Xpert Xpress SARS-CoV-2/FLU/RSV plus assay is intended as an aid in the diagnosis of influenza from Nasopharyngeal swab specimens and should not be used as a sole basis for treatment. Nasal washings and aspirates are unacceptable for Xpert Xpress SARS-CoV-2/FLU/RSV testing.  Fact Sheet for Patients: BloggerCourse.com  Fact Sheet for Healthcare Providers: SeriousBroker.it  This test is not yet approved or  cleared by the [place] FDA and has been authorized for detection and/or diagnosis of SARS-CoV-2 by FDA under an Emergency Use Authorization (EUA). This EUA will remain in effect (meaning this test can be used) for the duration of the COVID-19 declaration under Section 564(b)(1) of the Act, 21 U.S.C. section 360bbb-3(b)(1), unless the authorization is terminated or revoked.  Performed at [facility], [address] [place], [place] [address]   Resp Panel by RT-PCR (Flu A&B, Covid) Nasopharyngeal Swab     Status: None   Collection Time: 06/28/21  2:25 PM   Specimen: Nasopharyngeal Swab; Nasopharyngeal(NP) swabs in vial transport medium  Result Value Ref Range Status   SARS Coronavirus 2 by RT PCR NEGATIVE NEGATIVE Final    Comment: (NOTE) SARS-CoV-2 target nucleic acids are NOT DETECTED.  The SARS-CoV-2 RNA is generally detectable in upper respiratory specimens during the acute phase of infection. The lowest concentration of SARS-CoV-2 viral copies this assay can detect is 138 copies/mL. A negative result does not preclude SARS-Cov-2 infection and should not be used as the sole basis for treatment or other patient management decisions. A negative result may occur with  improper specimen collection/handling, submission of specimen other than nasopharyngeal swab, presence of viral mutation(s) within the areas targeted by this assay, and inadequate number of viral copies(<138 copies/mL). A negative result must be combined with clinical observations, patient history, and epidemiological information. The expected result is Negative.  Fact Sheet for Patients:  BloggerCourse.com  Fact Sheet for Healthcare Providers:  SeriousBroker.it  This test is no t yet approved or cleared by the [place] and  has been authorized for detection and/or diagnosis of SARS-CoV-2 by FDA under an Emergency Use Authorization (EUA).  This EUA will remain  in effect (meaning this test can be used) for the duration of the COVID-19 declaration under Section 564(b)(1) of the Act, 21 U.S.C.section 360bbb-3(b)(1), unless the authorization is terminated  or revoked sooner.       Influenza A by PCR NEGATIVE NEGATIVE Final   Influenza B by PCR NEGATIVE NEGATIVE Final    Comment: (NOTE) The Xpert Xpress SARS-CoV-2/FLU/RSV plus assay is intended as an aid in the diagnosis of influenza from Nasopharyngeal swab specimens and should not be used as a sole basis for treatment. Nasal washings and aspirates are unacceptable for Xpert Xpress SARS-CoV-2/FLU/RSV testing.  Fact Sheet for Patients: BloggerCourse.com  Fact Sheet for Healthcare Providers: SeriousBroker.it  This test is not yet approved or cleared by the [place] FDA and has been authorized for detection and/or diagnosis of SARS-CoV-2 by FDA under an Emergency Use Authorization (EUA). This EUA will remain in effect (meaning this test can be used) for the duration of the COVID-19 declaration under Section 564(b)(1) of the Act, 21 U.S.C. section 360bbb-3(b)(1), unless the authorization is terminated or revoked.  Performed at [facility], [address] [place], [place] [address]   MRSA Next Gen by PCR, Nasal     Status: None   Collection Time: 06/30/21 11:19 AM   Specimen: Nasal Mucosa; Nasal Swab  Result Value Ref Range Status   MRSA by PCR Next Gen NOT DETECTED NOT DETECTED Final    Comment: (NOTE) The GeneXpert MRSA Assay (FDA approved for NASAL specimens only), is one component of a comprehensive MRSA colonization surveillance program. It is not intended to diagnose MRSA infection nor to guide or monitor treatment for MRSA infections. Test performance is not FDA approved in patients less than 23 years old. Performed at [facility], [address], [place], [place] [address]       Time coordinating discharge: 40 minutes  SIGNED:   Dorcas Carrow, MD  [facility] 07/01/2021, 12:49 PM

## 2021-07-01 NOTE — Progress Notes (Signed)
Progress Note  Patient Name: Yvette [name] Date of Encounter: 07/01/2021  Primary Cardiologist: Lorine Bears, MD  Subjective   Feels well this morning.  Eager to go home due to some childcare issues.  Nonsustained VT quiescent since approximately 8 PM last night.  Inpatient Medications    Scheduled Meds:  Chlorhexidine Gluconate Cloth  6 each Topical Daily   folic acid  1 mg Oral Daily   [START ON 07/02/2021] magnesium oxide  400 mg Oral BID   metoprolol tartrate  50 mg Oral BID   multivitamin with minerals  1 tablet Oral Daily   potassium chloride  40 mEq Oral BID   thiamine  100 mg Oral Daily   Or   thiamine  100 mg Intravenous Daily   Continuous Infusions:  lactated ringers 125 mL/hr at 07/01/21 0604   PRN Meds: albuterol, LORazepam, metoprolol tartrate, morphine injection   Vital Signs    Vitals:   07/01/21 0300 07/01/21 0400 07/01/21 0500 07/01/21 0800  BP: (!) 145/133 (!) 128/99 (!) 126/100 118/78  Pulse: 74 69 71 88  Resp: 19 17 14 16   Temp:    97.6 F (36.4 C)  TempSrc:    Oral  SpO2: 98% 100% 99% 100%  Weight:      Height:        Intake/Output Summary (Last 24 hours) at 07/01/2021 0955 Last data filed at 07/01/2021 0515 Gross per 24 hour  Intake 3520.69 ml  Output 900 ml  Net 2620.69 ml   Filed Weights   06/28/21 1031 06/30/21 1119  Weight: 47.6 kg 44.4 kg    Physical Exam   GEN: Thin, in no acute distress.  HEENT: Grossly normal.  Neck: Supple, no JVD, carotid bruits, or masses. Cardiac: RRR, no murmurs, rubs, or gallops. No clubbing, cyanosis, edema.  Radials 2+, DP/PT 2+ and equal bilaterally.  Respiratory:  Respirations regular and unlabored, clear to auscultation bilaterally. GI: Soft, nontender, nondistended, BS + x 4. MS: no deformity or atrophy. Skin: warm and dry, no rash. Neuro:  Strength and sensation are intact. Psych: AAOx3.  Normal affect.  Labs    Chemistry Recent Labs  Lab 06/27/21 1237 06/28/21 1226  06/29/21 0615 06/29/21 2358 06/30/21 1152 07/01/21 0400  NA 140   < > 132* 134*  --  131*  K 3.7   < > 4.0 4.0 3.5 3.8  CL 102   < > 101 96*  --  99  CO2 20*   < > 22 27  --  27  GLUCOSE 99   < > 83 89  --  94  BUN 6   < > <5* <5*  --  6  CREATININE 0.52   < > 0.68 0.53  --  0.61  CALCIUM 9.4   < > 8.4* 9.0  --  8.3*  PROT 9.0*  --   --   --   --  5.9*  ALBUMIN 4.9  --   --   --   --  3.2*  AST 89*  --   --   --   --  39  ALT 40  --   --   --   --  22  ALKPHOS 81  --   --   --   --  49  BILITOT 0.8  --   --   --   --  1.0  GFRNONAA >60   < > >60 >60  --  >60  ANIONGAP 18*   < >  9 11  --  5   < > = values in this interval not displayed.     Hematology Recent Labs  Lab 06/27/21 1237 06/29/21 0615 07/01/21 0400  WBC 7.1 3.6* 2.5*  RBC 4.33 3.64* 3.91  HGB 12.1 10.2* 11.0*  HCT 36.3 30.7* 32.2*  MCV 83.8 84.3 82.4  MCH 27.9 28.0 28.1  MCHC 33.3 33.2 34.2  RDW 17.1* 16.7* 15.8*  PLT 137* 87* 108*    Cardiac Enzymes  Recent Labs  Lab 06/30/21 0140 06/30/21 0340  TROPONINIHS 24* 25*      Lipids  Lab Results  Component Value Date   CHOL 261 (H) 01/01/2020   TRIG 178 (H) 01/01/2020   Radiology    CT ABDOMEN PELVIS W CONTRAST  Result Date: 06/27/2021 CLINICAL DATA:  Nausea and vomiting EXAM: CT ABDOMEN AND PELVIS WITH CONTRAST TECHNIQUE: Multidetector CT imaging of the abdomen and pelvis was performed using the standard protocol following bolus administration of intravenous contrast. CONTRAST:  32mL OMNIPAQUE IOHEXOL 300 MG/ML  SOLN COMPARISON:  None. FINDINGS: Lower chest: No acute abnormality. Hepatobiliary: Mild fatty infiltration of the liver is noted. The gallbladder appears within normal limits. Pancreas: Unremarkable. No pancreatic ductal dilatation or surrounding inflammatory changes. Spleen: Normal in size without focal abnormality. Adrenals/Urinary Tract: Adrenal glands are within normal limits. Kidneys demonstrate a normal enhancement pattern bilaterally.  No renal calculi or obstructive changes are noted. The ureters are within normal limits bilaterally. The bladder is decompressed. Stomach/Bowel: Distal colon is decompressed. No obstructive changes are seen. The appendix is within normal limits. Small bowel appears within normal limits. Stomach shows a small sliding-type hiatal hernia. Vascular/Lymphatic: Abdominal aorta is within normal limits. Prominent ovarian vein is noted on the left with prominent periuterine veins. Similar prominent periuterine veins are noted on the right draining into the internal iliac vein on the right. No sizable lymphadenopathy is noted. Reproductive: Uterus is within normal limits. Prominent periuterine veins are seen. No adnexal abnormality is seen. Other: No abdominal wall hernia or abnormality. No abdominopelvic ascites. Musculoskeletal: No acute or significant osseous findings. IMPRESSION: No acute abnormality to correspond with the patient's given clinical history. Prominent periuterine veins bilaterally as described. These may represent some changes of pelvic congestion. No other focal abnormality is noted. Electronically Signed   By: Alcide Clever M.D.   On: 06/27/2021 23:15   Telemetry    Sinus rhythm w/o recurrent NSVT since ~ 20oo on 12/20. - Personally Reviewed  Cardiac Studies   2D Echocardiogram 12.20.2022   1. Left ventricular ejection fraction, by estimation, is 50 to 55%. The  left ventricle has low normal function. Left ventricular endocardial  border not optimally defined to evaluate regional wall motion. Left  ventricular diastolic parameters are  indeterminate.   2. Accurate EF evaluation and wall motion assesment is limited by  frequent PVCs.   3. Right ventricular systolic function is normal. The right ventricular  size is normal. Tricuspid regurgitation signal is inadequate for assessing  PA pressure.   4. The mitral valve is normal in structure. No evidence of mitral valve  regurgitation. No  evidence of mitral stenosis.   5. The aortic valve is normal in structure. Aortic valve regurgitation is  not visualized. No aortic stenosis is present.   6. The inferior vena cava is normal in size with greater than 50%  respiratory variability, suggesting right atrial pressure of 3 mmHg.   Patient Profile     40 y.o. 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  Assessment & Plan    1.  Nonsustained ventricular tachycardia/prolonged QT: Beginning the p.m. December 19, patient had nearly incessant runs of nonsustained ventricular tachycardia, sometimes associate presyncope and palpitations.  This occurred in the setting of hypokalemia, hypomagnesemia, and concomitant usage of Phenergan and mirtazapine.  She had also received a dose of droperidol earlier this admission.  In this setting, she was noted to have widening of her QTc 580 ms on ECG December 20 out to at 16:47.  QT prolonging medications have been discontinued and she has been aggressively supplemented for potassium and magnesium.  She also was placed on metoprolol and lidocaine with quiescence of nonsustained VT since approximately 8 PM last night.  Echocardiogram shows an EF of 50 to 55%.  This morning, patient indicates that she needs to leave because of some chest daycare issues with her daughter magnesium infusion and to see what her heart rhythm does following discontinuation of lidocaine this morning.  I have increased her metoprolol to 50 mg twice daily.  We stressed the importance of alcohol cessation as it appears that her alcoholic gastritis is playing a major role in her nausea/vomiting, which resulted in usage of antiemetics/QT prolonging medications, as well as  hypokalemia and hypomagnesemia.  We further stressed the importance of compliance with beta-blocker and oral magnesium therapy.  We will arrange for placement of a Zio monitor prior to discharge as well as outpatient follow-up in 2 to 3 weeks.  2.  Hypokalemia/hypomagnesemia: Patient has been aggressively supplemented.  Recommend discharge with Mag-Ox 400 mg twice daily.  3.  Elevated high-sensitivity troponin: Mild elevation with flat trend of 24  25.  This does not represent ACS.  She has not had any chest pain.  I am encouraged by echo findings with low normal EF of 50-55%.  4.  Alcohol/tobacco abuse: Cessation advised.   Signed, Nicolasa Ducking, NP  07/01/2021, 9:55 AM    For questions or updates, please contact   Please consult [email or web address] for contact info under Cardiology/STEMI.

## 2021-07-01 NOTE — TOC Initial Note (Signed)
Transition of Care [facility]) - Initial/Assessment Note    Patient Details  Name: Yvette [name] MRN: [id number] Date of Birth: April 10, 1981  [facility]) CM/SW Contact:    Shelbie Hutching, RN Phone Number: 07/01/2021, 4:04 PM  Clinical Narrative:                 Patient discharging home today.  RNCM met with patient at the bedside - [facility] consult for substance abuse resources, resources provided to patient to follow up outpatient.  She lives with her 38 year old daughter and significant other.  She does not drive.  She is not currently working but applying for disability.  Her significant other works. She does have someone to pick her up today.   Zio heart monitor applied before discharge and patient will follow up outpatient with cardiology.    Expected Discharge Plan: Home/Self Care Barriers to Discharge: Barriers Resolved   Patient Goals and CMS Choice Patient states their goals for this hospitalization and ongoing recovery are:: Patient wants to get home      Expected Discharge Plan and Services Expected Discharge Plan: Home/Self Care   Discharge Planning Services: CM Consult   Living arrangements for the past 2 months: Apartment Expected Discharge Date: 07/01/21               DME Arranged: N/A DME Agency: NA       HH Arranged: NA [place] Agency: NA        Prior Living Arrangements/Services Living arrangements for the past 2 months: Apartment Lives with:: Minor Children, Significant Other Patient language and need for interpreter reviewed:: Yes Do you feel safe going back to the place where you live?: Yes      Need for Family Participation in Patient Care: Yes (Comment) Care giver support system in place?: Yes (comment) (significant other)   Criminal Activity/Legal Involvement Pertinent to Current Situation/Hospitalization: No - Comment as needed  Activities of Daily Living Home Assistive Devices/Equipment: None ADL Screening (condition at time of  admission) Patient's cognitive ability adequate to safely complete daily activities?: Yes Is the patient deaf or have difficulty hearing?: No Does the patient have difficulty seeing, even when wearing glasses/contacts?: No Does the patient have difficulty concentrating, remembering, or making decisions?: No Patient able to express need for assistance with ADLs?: No Does the patient have difficulty dressing or bathing?: No Independently performs ADLs?: Yes (appropriate for developmental age) Does the patient have difficulty walking or climbing stairs?: No Weakness of Legs: None Weakness of Arms/Hands: None  Permission Sought/Granted Permission sought to share information with : Case Manager Permission granted to share information with : No              Emotional Assessment Appearance:: Appears stated age Attitude/Demeanor/Rapport: Engaged Affect (typically observed): Accepting Orientation: : Oriented to Self, Oriented to Place, Oriented to  Time, Oriented to Situation Alcohol / Substance Use: Alcohol Use Psych Involvement: No (comment)  Admission diagnosis:  Hypokalemia [E87.6] Intractable nausea and vomiting [R11.2] Nausea and vomiting, unspecified vomiting type [R11.2] Patient Active Problem List   Diagnosis Date Noted   Intractable nausea and vomiting 06/28/2021   GERD (gastroesophageal reflux disease) 06/28/2021   Tobacco abuse 06/28/2021   Alcohol abuse 06/28/2021   Hematemesis 39/53/2023   Alcoholic liver disease, unspecified ([place]) 10/26/2020   Alcohol withdrawal ([place]) 38/35/6861   Alcoholic ketoacidosis 68/37/2902   AKI (acute kidney injury) ([place]) 10/26/2020   High serum osmolar gap 05/27/5207   Alcoholic cirrhosis of liver  without ascites ([place])    Hiatal hernia    Portal hypertension ([facility])    Nausea & vomiting 47/99/8721   Alcoholic gastritis 58/72/7618   Intractable vomiting 10/01/2019   Alcohol use, daily 09/30/2019   Elevated LFTs 09/30/2019    Thrombocytopenia ([place]) 09/30/2019   Hypokalemia    Intractable vomiting with nausea 08/23/2019   Dehydration    Food poisoning    Other neutropenia ([place]) 06/11/2016   Leukopenia 02/03/2015   PCP:  [facility], [place]:   [place] [address]), [place] [address]) [place] [address] Phone: [phone] Fax: [phone]     Social Determinants of Health (SDOH) Interventions    Readmission Risk Interventions No flowsheet data found.

## 2021-07-21 NOTE — Addendum Note (Signed)
Encounter addended by: Valora Corporal, RN on: 07/21/2021 11:48 AM  Actions taken: Imaging Exam ended

## 2021-07-27 ENCOUNTER — Telehealth: Payer: Self-pay | Admitting: *Deleted

## 2021-07-27 NOTE — Telephone Encounter (Signed)
-----   Message from Creig Hines, NP sent at 07/24/2021  5:36 PM EST ----- Monitor showed predominantly normal heart rhythm with moderately elevated average HR of 106 bpm.  She had rare extra beats from the top and bottom chambers as well as one brief run of fast heart beats from the top chambers that only lasted 6 beats.  Most triggered events were associated with normal rhythms.  No significant arrhythmias, like what we saw during her hospitalization, which is very good news.  With elevated resting HRs, please ensure that she is taking her metoprolol and avoiding alcohol.

## 2021-07-27 NOTE — Telephone Encounter (Signed)
Reviewed results and recommendations with patient. She has not been taking metoprolol and does not have that prescription. Sent in refill to her pharmacy with instructions to start that to help with her fast heart rates. She denies any alcohol intake as well. She needed to reschedule her appointment due to conflict with her work schedule. Rescheduled for her to see Dr. Kirke Corin and instructed her to give [place] a call if that date and time does not work for her. She verbalized understanding of our conversation with no further questions at this time.

## 2021-07-31 ENCOUNTER — Ambulatory Visit: Payer: Medicaid Other | Admitting: Cardiovascular Disease

## 2021-08-18 ENCOUNTER — Other Ambulatory Visit: Payer: Self-pay

## 2021-08-18 ENCOUNTER — Telehealth: Payer: Self-pay

## 2021-08-18 DIAGNOSIS — D708 Other neutropenia: Secondary | ICD-10-CM

## 2021-08-18 NOTE — Telephone Encounter (Signed)
Labs already entered.

## 2021-08-18 NOTE — Telephone Encounter (Addendum)
Patient was referred back by PCP.  This is an established patient that did not come for 1 yr follow up on 08/19/20.  Patient has been scheduled for 2/14 for lab/MD.    Dr. Jacinto Reap, would you like labs other than cbc/cmp/ldh ordered?

## 2021-08-18 NOTE — Telephone Encounter (Signed)
-----   Message from Ronni Rumble sent at 08/18/2021 11:26 AM EST ----- Regarding: RE: REFERRAL- [facility] Spoke with patient and she has been scheduled for Lab/MD on 2/14.   Victorino Dike ----- Message ----- From: Guerry Minors, CMA Sent: 08/18/2021  11:07 AM EST To: Chyrl [place]atte, # Subject: RE: [facility]  08/20/19 check out--12 months- MD-labs-cbc/cmp/ldh; 08/19/20 NS.    Please schedule pt for lab/MD (first available)  ----- Message ----- From: Reggy Eye Sent: 08/18/2021  10:34 AM EST To: Ronni Rumble, [place], # Subject: REFERRAL- [facility]      REFERRAL- [facility] SENT TO CHART. This is a patient of Dr Donneta Romberg. Last seen 08/20/2019. I think she just needs to be scheduled. Dr B, please advise. Demographics are up to date.

## 2021-08-20 ENCOUNTER — Ambulatory Visit (INDEPENDENT_AMBULATORY_CARE_PROVIDER_SITE_OTHER): Payer: Medicaid Other | Admitting: Cardiovascular Disease

## 2021-08-20 ENCOUNTER — Encounter: Payer: Self-pay | Admitting: Cardiovascular Disease

## 2021-08-20 ENCOUNTER — Other Ambulatory Visit: Payer: Self-pay

## 2021-08-20 VITALS — BP 108/74 | HR 78 | Ht 62.0 in | Wt 116.2 lb

## 2021-08-20 DIAGNOSIS — R9431 Abnormal electrocardiogram [ECG] [EKG]: Secondary | ICD-10-CM

## 2021-08-20 DIAGNOSIS — I4729 Other ventricular tachycardia: Secondary | ICD-10-CM | POA: Diagnosis not present

## 2021-08-20 DIAGNOSIS — I471 Supraventricular tachycardia: Secondary | ICD-10-CM

## 2021-08-20 NOTE — Progress Notes (Signed)
Cardiology Office Note   Date:  08/20/2021   ID:  Yvette, [name] 1981/03/20, MRN [id number]  PCP:  [facility], [facility]  Cardiologist:   Lorine Bears, MD   Chief Complaint  Patient presents with   Other    F/u hosp. NSVT no complaints today. Meds reviewed verbally with pt.      History of Present Illness: Yvette [name] is a 41 y.o. female who presents for a follow-up visit regarding polymorphic ventricular tachycardia and prolonged QT interval. 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  She had multiple hospitalizations in the last 2 years due to nausea and vomiting in the setting of alcoholic gastritis with associated hypokalemia and hypomagnesemia.  She has known history of prolonged QT interval frequently above 500 ms in the setting of electrolyte abnormalities.  During her hospitalization in December, she was given Phenergan for nausea and was noted to have frequent PVCs and runs of polymorphic nonsustained ventricular tachycardia that persisted even after giving magnesium.  Echocardiogram showed low normal LV systolic function with no significant valvular abnormalities but the study was very suboptimal due to very frequent PVCs.  We recommended stopping all medications that can cause QT prolongation as well as electrolyte replacement.  She was treated during that admission with lidocaine.  Outpatient cardiac CTA was recommended given previous history of Kawasaki disease.  Outpatient ZIO monitor was done which showed sinus rhythm with a mean heart rate of 106 bpm.  1 run of SVT was noted that lasted 6 beats.  PVCs were rare overall.  She was discharged home on potassium and metoprolol but did not take both medications. She actually cut down on alcohol intake to 2 beers a day.  She has been doing very well with no chest pain, shortness of breath or  palpitations.  Her EKG normalized. She smokes 2 to 3 cigarettes a day.   Past Medical History:  Diagnosis Date   Asthma    ETOH abuse    H/O Kawasaki's disease    as 23 month old child and at 63 years   History of anemia    History of blood transfusion 1984   Hypokalemia    Hypomagnesemia    Leukopenia    Neutropenia (HCC)    NSVT (nonsustained ventricular tachycardia)    a. Noted during admission 06/2021 assoc w/ palpitations and presyncope.   Tobacco abuse    Transaminitis     Past Surgical History:  Procedure Laterality Date   ESOPHAGOGASTRODUODENOSCOPY N/A 10/29/2020   Procedure: ESOPHAGOGASTRODUODENOSCOPY (EGD);  Surgeon: Wyline Mood, MD;  Location: [facility] ENDOSCOPY;  Service: Gastroenterology;  [name];   ESOPHAGOGASTRODUODENOSCOPY (EGD) WITH PROPOFOL N/A 03/14/2020   Procedure: ESOPHAGOGASTRODUODENOSCOPY (EGD) WITH PROPOFOL;  Surgeon: Pasty Spillers, MD;  [name];  Service: Endoscopy;  [name];   ESOPHAGOGASTRODUODENOSCOPY (EGD) WITH PROPOFOL N/A 02/25/2021   Procedure: ESOPHAGOGASTRODUODENOSCOPY (EGD) WITH PROPOFOL;  Surgeon: Pasty Spillers, MD;  [name];  Service: Endoscopy;  [name];     Current Outpatient Medications  Medication Sig Dispense Refill   ibuprofen (ADVIL) 800 MG tablet Take by mouth as needed.     pantoprazole (PROTONIX) 40 MG tablet Take 40 mg by mouth daily.     metoprolol tartrate (LOPRESSOR) 50 MG tablet Take 1 tablet (50 mg total) by mouth 2 (two) times daily. (Patient  not taking: Reported on 08/20/2021) 60 tablet 0   potassium chloride SA (KLOR-CON M) 20 MEQ tablet Take 1 tablet (20 mEq total) by mouth daily. (Patient not taking: Reported on 08/20/2021) 30 tablet 0   No current facility-administered medications for this visit.    Allergies:   Patient has no known allergies.    Social History:  The patient  reports that she has been smoking cigarettes. She has a 2.50 pack-year smoking  history. She has never used smokeless tobacco. She reports current alcohol use of about 61.0 standard drinks per week. She reports that she does not use drugs.   Family History:  The patient's family history includes Cancer in her paternal aunt and paternal aunt; Lupus in her mother; Prostate cancer in her father.    ROS:  Please see the history of present illness.   Otherwise, review of systems are positive for none.   All other systems are reviewed and negative.    PHYSICAL EXAM: VS:  BP 108/74 (BP Location: Right Arm, Patient Position: Sitting, Cuff Size: Normal)    Pulse 78    Ht 5\' 2"  (1.575 m)    Wt 116 lb 4 oz (52.7 kg)    SpO2 98%    BMI 21.26 kg/m  , BMI Body mass index is 21.26 kg/m. GEN: Well nourished, well developed, in no acute distress  HEENT: normal  Neck: no JVD, carotid bruits, or masses Cardiac: RRR; no murmurs, rubs, or gallops,no edema  Respiratory:  clear to auscultation bilaterally, normal work of breathing GI: soft, nontender, nondistended, + BS MS: no deformity or atrophy  Skin: warm and dry, no rash Neuro:  Strength and sensation are intact Psych: euthymic mood, full affect   EKG:  EKG is ordered today. The ekg ordered today demonstrates normal sinus rhythm with no significant ST or T wave changes.  Normal QTc interval at 444 ms.   Recent Labs: 07/01/2021: ALT 22; BUN 6; Creatinine, Ser 0.61; Hemoglobin 11.0; Magnesium 1.6; Platelets 108; Potassium 3.8; Sodium 131    Lipid Panel    Component Value Date/Time   CHOL 261 (H) 01/01/2020 1447   TRIG 178 (H) 01/01/2020 1447      Wt Readings from Last 3 Encounters:  08/20/21 116 lb 4 oz (52.7 kg)  06/30/21 97 lb 14.2 oz (44.4 kg)  06/27/21 105 lb (47.6 kg)       PAD Screen 08/20/2021  Previous PAD dx? No  Previous surgical procedure? No  Pain with walking? No  Feet/toe relief with dangling? No  Painful, non-healing ulcers? No  Extremities discolored? No      ASSESSMENT AND PLAN:  1.   Polymorphic ventricular tachycardia: This was due to severe QT prolongation related to hypokalemia, hypomagnesemia and medications.  All of this has resolved and her EKG has normalized.  I asked her to avoid medications that can cause QT prolongation and to avoid excessive alcohol use which in her situation usually leads to gastritis followed by nausea and vomiting and electrolyte abnormalities. Given that she did have Kawasaki disease as a child, I am going to obtain a treadmill stress test and showed no evidence of ischemia contributing to ventricular tachycardia.  2.  Alcohol and tobacco use: I discussed with her the importance of cessation.    Disposition:   FU in 3 months  Signed,  10/18/2021, MD  08/20/2021 3:27 PM    [facility]

## 2021-08-20 NOTE — Patient Instructions (Signed)
Medication Instructions:  Your physician recommends that you continue on your current medications as directed. Please refer to the Current Medication list given to you today.  *If you need a refill on your cardiac medications before your next appointment, please call your pharmacy*   Lab Work: None ordered If you have labs (blood work) drawn today and your tests are completely normal, you will receive your results only by: MyChart Message (if you have MyChart) OR A paper copy in the mail If you have any lab test that is abnormal or we need to change your treatment, we will call you to review the results.   Testing/Procedures: Your physician has requested that you have an exercise tolerance test. For further information please visit [email or web address] Please also follow instruction sheet, as given.    Follow-Up: At [facility], you and your health needs are our priority.  As part of our continuing mission to provide you with exceptional heart care, we have created designated Provider Care Teams.  These Care Teams include your primary Cardiologist (physician) and Advanced Practice Providers (APPs -  Physician Assistants and Nurse Practitioners) who all work together to provide you with the care you need, when you need it.  We recommend signing up for the patient portal called "MyChart".  Sign up information is provided on this After Visit Summary.  MyChart is used to connect with patients for Virtual Visits (Telemedicine).  Patients are able to view lab/test results, encounter notes, upcoming appointments, etc.  Non-urgent messages can be sent to your provider as well.   To learn more about what you can do with MyChart, go to [email or web address].    Your next appointment:   3 month(s)  The format for your next appointment:   In Person  Provider:   You may see Lorine Bears, MD or one of the following Advanced Practice Providers on your designated Care Team:   Nicolasa Ducking, NP Eula Listen, PA-C Cadence Fransico Michael, PA-C{   Other Instructions  Pre-test instructions for your exercise tolerance test  - you may eat a light breakfast/ lunch prior to your procedure - no caffeine for 24 hours prior to your test (coffee, tea, soft drinks, or chocolate)  - no smoking/ vaping for 4 hours prior to your test - you may take your regular medications the day of your test except for: - bring any inhalers with you to your test - wear comfortable clothing & tennis/ non-skid shoes to walk on the treadmill

## 2021-08-25 ENCOUNTER — Inpatient Hospital Stay: Payer: Medicaid Other | Attending: Internal Medicine

## 2021-08-25 ENCOUNTER — Encounter: Payer: Self-pay | Admitting: Internal Medicine

## 2021-08-25 ENCOUNTER — Other Ambulatory Visit: Payer: Self-pay

## 2021-08-25 ENCOUNTER — Inpatient Hospital Stay (HOSPITAL_BASED_OUTPATIENT_CLINIC_OR_DEPARTMENT_OTHER): Payer: Medicaid Other | Admitting: Internal Medicine

## 2021-08-25 DIAGNOSIS — D72819 Decreased white blood cell count, unspecified: Secondary | ICD-10-CM | POA: Insufficient documentation

## 2021-08-25 DIAGNOSIS — Z7289 Other problems related to lifestyle: Secondary | ICD-10-CM | POA: Diagnosis not present

## 2021-08-25 DIAGNOSIS — Z862 Personal history of diseases of the blood and blood-forming organs and certain disorders involving the immune mechanism: Secondary | ICD-10-CM | POA: Insufficient documentation

## 2021-08-25 DIAGNOSIS — F17210 Nicotine dependence, cigarettes, uncomplicated: Secondary | ICD-10-CM | POA: Diagnosis not present

## 2021-08-25 DIAGNOSIS — Z8042 Family history of malignant neoplasm of prostate: Secondary | ICD-10-CM | POA: Diagnosis not present

## 2021-08-25 DIAGNOSIS — Z79899 Other long term (current) drug therapy: Secondary | ICD-10-CM | POA: Diagnosis not present

## 2021-08-25 DIAGNOSIS — R232 Flushing: Secondary | ICD-10-CM | POA: Insufficient documentation

## 2021-08-25 DIAGNOSIS — D708 Other neutropenia: Secondary | ICD-10-CM

## 2021-08-25 DIAGNOSIS — R002 Palpitations: Secondary | ICD-10-CM | POA: Insufficient documentation

## 2021-08-25 DIAGNOSIS — Z832 Family history of diseases of the blood and blood-forming organs and certain disorders involving the immune mechanism: Secondary | ICD-10-CM | POA: Insufficient documentation

## 2021-08-25 DIAGNOSIS — Z803 Family history of malignant neoplasm of breast: Secondary | ICD-10-CM | POA: Diagnosis not present

## 2021-08-25 DIAGNOSIS — D649 Anemia, unspecified: Secondary | ICD-10-CM | POA: Diagnosis not present

## 2021-08-25 LAB — CBC WITH DIFFERENTIAL/PLATELET
Abs Immature Granulocytes: 0 10*3/uL (ref 0.00–0.07)
Basophils Absolute: 0 10*3/uL (ref 0.0–0.1)
Basophils Relative: 1 %
Eosinophils Absolute: 0.1 10*3/uL (ref 0.0–0.5)
Eosinophils Relative: 4 %
HCT: 32.5 % — ABNORMAL LOW (ref 36.0–46.0)
Hemoglobin: 10.6 g/dL — ABNORMAL LOW (ref 12.0–15.0)
Immature Granulocytes: 0 %
Lymphocytes Relative: 41 %
Lymphs Abs: 0.9 10*3/uL (ref 0.7–4.0)
MCH: 26.9 pg (ref 26.0–34.0)
MCHC: 32.6 g/dL (ref 30.0–36.0)
MCV: 82.5 fL (ref 80.0–100.0)
Monocytes Absolute: 0.5 10*3/uL (ref 0.1–1.0)
Monocytes Relative: 23 %
Neutro Abs: 0.7 10*3/uL — ABNORMAL LOW (ref 1.7–7.7)
Neutrophils Relative %: 31 %
Platelets: 211 10*3/uL (ref 150–400)
RBC: 3.94 MIL/uL (ref 3.87–5.11)
RDW: 16.5 % — ABNORMAL HIGH (ref 11.5–15.5)
WBC: 2.3 10*3/uL — ABNORMAL LOW (ref 4.0–10.5)
nRBC: 0 % (ref 0.0–0.2)

## 2021-08-25 LAB — LACTATE DEHYDROGENASE: LDH: 163 U/L (ref 98–192)

## 2021-08-25 LAB — COMPREHENSIVE METABOLIC PANEL WITH GFR
ALT: 14 U/L (ref 0–44)
AST: 42 U/L — ABNORMAL HIGH (ref 15–41)
Albumin: 4 g/dL (ref 3.5–5.0)
Alkaline Phosphatase: 70 U/L (ref 38–126)
Anion gap: 14 (ref 5–15)
BUN: 8 mg/dL (ref 6–20)
CO2: 19 mmol/L — ABNORMAL LOW (ref 22–32)
Calcium: 8.5 mg/dL — ABNORMAL LOW (ref 8.9–10.3)
Chloride: 99 mmol/L (ref 98–111)
Creatinine, Ser: 0.56 mg/dL (ref 0.44–1.00)
GFR, Estimated: >60 mL/min
Glucose, Bld: 86 mg/dL (ref 70–99)
Potassium: 3.4 mmol/L — ABNORMAL LOW (ref 3.5–5.1)
Sodium: 132 mmol/L — ABNORMAL LOW (ref 135–145)
Total Bilirubin: <0.1 mg/dL — ABNORMAL LOW (ref 0.3–1.2)
Total Protein: 8.1 g/dL (ref 6.5–8.1)

## 2021-08-25 NOTE — Progress Notes (Signed)
Went to ED in December 2022.

## 2021-08-25 NOTE — Patient Instructions (Signed)
#  Recommend gentle iron once a day; this iron pill should not cause gastric/or constipation.  However if you notice any stomach upset-STOP Taking the iron pill.

## 2021-08-25 NOTE — Assessment & Plan Note (Addendum)
#  Intermittent neutropenia white count  2-3 ANC [phone]; today absolute neutrophil count is 900; hemoglobin 12 platelets 127.  Asymptomatic/benign ethnic neutropenia/? Alcohol.  Previous bone marrow biopsy negative.  # Mild Anemia-Hb-10- Iron sat 7%; Ferritin -34 [PCP]- recommend PO gentle iron [Hx of constipation]  # hx of SVT- ? Sec to electrolyte abnormalites [s/p holter]- awaiting stress test  # Heavy alcohol-again discussed cessation patient not interested.  # DISPOSITION: # 3 months- NP; -labs-cbc/cmp/ldh-Dr.B  Cc; Therapist, nutritional.

## 2021-08-25 NOTE — Progress Notes (Signed)
Yvette [name] OFFICE PROGRESS NOTE  Patient Care Team: [facility], [facility] as PCP - General Fletcher Anon, Mertie Clause, MD as PCP - Cardiology (Cardiology)   SUMMARY OF ONCOLOGIC HISTORY:  # NOV 2015 WBC- 2.2/ANC- 1200/ALC-500; [name]; 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 Normal cytogenetics  # OCT 2016- Bil indeterminate lung nodules-CT  In July 2017- Improving.   # Hx of Kawasaki disease [age 44M & 13y]  # Alcohol abuse/Smoking  INTERVAL HISTORY:  A pleasant 41 year-old African-American female patient with above history of chronic mild leukopenia/neutropenia is here for follow-up.  Patient has been evaluated recently in the hospital for SVT likely secondary to electrolyte disturbance.  She is currently being evaluated by cardiology for chest discomfort and palpitations.  Patient unfortunately continues to drink alcohol.  She also smokes.  She denies any recent fevers or hospitalizations.  No weight loss or night sweats.   She does complain of hot flashes-she is on Lupron.  She also complains of easy bruising.  Review of Systems  Constitutional:  Negative for chills, diaphoresis, fever, [name], hemoptysis, sputum production, [name], palpitations, orthopnea and leg swelling.  Gastrointestinal:  Negative for abdominal pain, blood in stool, constipation, diarrhea, heartburn, melena, nausea and vomiting.  Genitourinary:  Negative for dysuria, frequency and urgency.  Musculoskeletal:  Negative for back pain and joint pain.  Skin: Negative.  Negative for itching and rash.  Neurological:  Negative for dizziness, tingling, focal weakness,  weakness and headaches.  Endo/Heme/Allergies:  Does not bruise/bleed easily.  Psychiatric/Behavioral:  Negative for depression. The patient is not nervous/anxious and does not have insomnia.    PAST MEDICAL HISTORY :  Past Medical History:  Diagnosis Date   Asthma    ETOH abuse    H/O Kawasaki's disease    as 17 month old child and at 30 years   History of anemia    History of blood transfusion 1984   Hypokalemia    Hypomagnesemia    Leukopenia    Neutropenia (HCC)    NSVT (nonsustained ventricular tachycardia)    a. Noted during admission 06/2021 assoc w/ palpitations and presyncope.   Tobacco abuse    Transaminitis     PAST SURGICAL HISTORY :   Past Surgical History:  Procedure Laterality Date   ESOPHAGOGASTRODUODENOSCOPY N/A 10/29/2020   Procedure: ESOPHAGOGASTRODUODENOSCOPY (EGD);  Surgeon: Jonathon Bellows, MD;  Location: [facility] ENDOSCOPY;  Service: Gastroenterology;  [name];   ESOPHAGOGASTRODUODENOSCOPY (EGD) WITH PROPOFOL N/A 03/14/2020   Procedure: ESOPHAGOGASTRODUODENOSCOPY (EGD) WITH PROPOFOL;  Surgeon: Virgel Manifold, MD;  [name];  Service: Endoscopy;  [name];   ESOPHAGOGASTRODUODENOSCOPY (EGD) WITH PROPOFOL N/A 02/25/2021   Procedure: ESOPHAGOGASTRODUODENOSCOPY (EGD) WITH PROPOFOL;  Surgeon: Virgel Manifold, MD;  [name];  Service: Endoscopy;  [name];    FAMILY HISTORY :   Family History  Problem Relation Age of Onset   Lupus Mother    Prostate cancer Father    Cancer Paternal Aunt        Breast    Cancer Paternal Aunt        Breast     SOCIAL HISTORY:   Social History   Tobacco Use  Smoking status: Every Day    Packs/day: 0.25    Years: 10.00    Pack years: 2.50    Types: Cigarettes   Smokeless tobacco: Never  Vaping Use   Vaping Use: Never used  Substance Use Topics   Alcohol use: Yes    Alcohol/week: 61.0 standard drinks    Types: 21 Cans of beer, 40 Standard drinks or equivalent  per week    Comment: daily   Drug use: No    ALLERGIES:  has No Known Allergies.  MEDICATIONS:  Current Outpatient Medications  Medication Sig Dispense Refill   ibuprofen (ADVIL) 800 MG tablet Take by mouth as needed.     mirtazapine (REMERON) 15 MG tablet Take 15 mg by mouth at bedtime.     pantoprazole (PROTONIX) 40 MG tablet Take 40 mg by mouth daily.     No current facility-administered medications for this visit.    PHYSICAL EXAMINATION: ECOG PERFORMANCE STATUS: 0 - Asymptomatic  BP 123/84 (BP Location: Right Arm, Patient Position: Sitting, Cuff Size: Normal)    Pulse (!) 120    Temp 97.9 F (36.6 C) (Tympanic)    Ht $R'5\' 2"'kp$  (1.575 m)    Wt 113 lb 3.2 oz (51.3 kg)    SpO2 100%    BMI 20.70 kg/m   Filed Weights   08/25/21 1540  Weight: 113 lb 3.2 oz (51.3 kg)    Physical Exam HENT:     Head: Normocephalic and atraumatic.     Mouth/Throat:     Pharynx: No oropharyngeal exudate.  Eyes:     Pupils: Pupils are equal, round, and reactive to light.  Cardiovascular:     Rate and Rhythm: Normal rate and regular rhythm.  Pulmonary:     Effort: No respiratory distress.     Breath sounds: No wheezing.  Abdominal:     General: Bowel sounds are normal. There is no distension.     Palpations: Abdomen is soft. There is no mass.     Tenderness: There is no abdominal tenderness. There is no guarding or rebound.  Musculoskeletal:        General: No tenderness. Normal range of motion.     Cervical back: Normal range of motion and neck supple.  Skin:    General: Skin is warm.  Neurological:     Mental Status: She is alert and oriented to person, place, and time.  Psychiatric:        Mood and Affect: Affect normal.     LABORATORY DATA:  I have reviewed the data as listed    Component Value Date/Time   NA 132 (L) 08/25/2021 1522   NA 141 02/05/2021 1334   K 3.4 (L) 08/25/2021 1522   CL 99 08/25/2021 1522   CO2 19 (L) 08/25/2021 1522   GLUCOSE 86 08/25/2021 1522   BUN 8  08/25/2021 1522   BUN 7 02/05/2021 1334   CREATININE 0.56 08/25/2021 1522   CALCIUM 8.5 (L) 08/25/2021 1522   PROT 8.1 08/25/2021 1522   PROT 8.0 02/05/2021 1334   ALBUMIN 4.0 08/25/2021 1522   ALBUMIN 4.9 (H) 02/05/2021 1334   AST 42 (H) 08/25/2021 1522   ALT 14 08/25/2021 1522   ALKPHOS 70 08/25/2021 1522   BILITOT <0.1 (L) 08/25/2021 1522   BILITOT 0.2 02/05/2021 1334   GFRNONAA >60 08/25/2021 1522   GFRAA >60 11/15/2019 2054    No results found for: SPEP, UPEP  Lab Results  Component Value Date   WBC 2.3 (  L) 08/25/2021   NEUTROABS 0.7 (L) 08/25/2021   HGB 10.6 (L) 08/25/2021   HCT 32.5 (L) 08/25/2021   MCV 82.5 08/25/2021   PLT 211 08/25/2021      Chemistry      Component Value Date/Time   NA 132 (L) 08/25/2021 1522   NA 141 02/05/2021 1334   K 3.4 (L) 08/25/2021 1522   CL 99 08/25/2021 1522   CO2 19 (L) 08/25/2021 1522   BUN 8 08/25/2021 1522   BUN 7 02/05/2021 1334   CREATININE 0.56 08/25/2021 1522   GLU 76 01/01/2020 1447      Component Value Date/Time   CALCIUM 8.5 (L) 08/25/2021 1522   ALKPHOS 70 08/25/2021 1522   AST 42 (H) 08/25/2021 1522   ALT 14 08/25/2021 1522   BILITOT <0.1 (L) 08/25/2021 1522   BILITOT 0.2 02/05/2021 1334       RADIOGRAPHIC STUDIES: I have personally reviewed the radiological images as listed and agreed with the findings in the report. No results found.   ASSESSMENT & PLAN:  Other neutropenia ([place]) # Intermittent neutropenia white count  2-3 ANC [phone]; today absolute neutrophil count is 900; hemoglobin 12 platelets 127.  Asymptomatic/benign ethnic neutropenia/? Alcohol.  Previous bone marrow biopsy negative.  # Mild Anemia-Hb-10- Iron sat 7%; Ferritin -34 [PCP]- recommend PO gentle iron [Hx of constipation]  # hx of SVT- ? Sec to electrolyte abnormalites [s/p holter]- awaiting stress test  # Heavy alcohol-again discussed cessation patient not interested.  # DISPOSITION: # 3 months- NP;  -labs-cbc/cmp/ldh-Dr.B  Cc; Therapist, nutritional.       Cammie Sickle, MD 08/31/2021 7:58 PM

## 2021-09-30 ENCOUNTER — Telehealth: Payer: Self-pay

## 2021-09-30 NOTE — Telephone Encounter (Signed)
Called patient and reviewed instructions for her stress test tomorrow. Patient confirmed that she will be here at 2:00 pm. ?

## 2021-10-01 ENCOUNTER — Ambulatory Visit (INDEPENDENT_AMBULATORY_CARE_PROVIDER_SITE_OTHER): Payer: Medicaid Other

## 2021-10-01 ENCOUNTER — Other Ambulatory Visit: Payer: Self-pay

## 2021-10-01 DIAGNOSIS — I4729 Other ventricular tachycardia: Secondary | ICD-10-CM

## 2021-10-02 LAB — EXERCISE TOLERANCE TEST
Angina Index: 0
Base ST Depression (mm): 0 mm
Duke Treadmill Score: 6
Estimated workload: 7.4
Exercise duration (min): 6 min
Exercise duration (sec): 19 s
MPHR: 182 {beats}/min
Peak HR: 184 {beats}/min
Percent HR: 101 %
RPE: 17
Rest HR: 90 {beats}/min
ST Depression (mm): 0 mm

## 2021-11-19 ENCOUNTER — Ambulatory Visit: Payer: Medicaid Other | Admitting: Cardiovascular Disease

## 2021-11-24 ENCOUNTER — Inpatient Hospital Stay: Payer: Medicaid Other | Attending: Nurse Practitioner

## 2021-11-24 ENCOUNTER — Inpatient Hospital Stay: Payer: Medicaid Other | Admitting: Nurse Practitioner

## 2021-11-24 ENCOUNTER — Encounter: Payer: Self-pay | Admitting: Nurse Practitioner

## 2022-01-08 ENCOUNTER — Observation Stay
Admission: EM | Admit: 2022-01-08 | Discharge: 2022-01-09 | Disposition: A | Discharge status: Home / Self Care | Payer: Medicaid Other | Attending: Internal Medicine | Admitting: Internal Medicine

## 2022-01-08 ENCOUNTER — Emergency Department: Payer: Medicaid Other

## 2022-01-08 ENCOUNTER — Other Ambulatory Visit: Payer: Self-pay

## 2022-01-08 DIAGNOSIS — J45909 Unspecified asthma, uncomplicated: Secondary | ICD-10-CM | POA: Insufficient documentation

## 2022-01-08 DIAGNOSIS — R7401 Elevation of levels of liver transaminase levels: Secondary | ICD-10-CM | POA: Diagnosis not present

## 2022-01-08 DIAGNOSIS — R7989 Other specified abnormal findings of blood chemistry: Secondary | ICD-10-CM | POA: Diagnosis present

## 2022-01-08 DIAGNOSIS — R112 Nausea with vomiting, unspecified: Secondary | ICD-10-CM | POA: Diagnosis not present

## 2022-01-08 DIAGNOSIS — K7030 Alcoholic cirrhosis of liver without ascites: Secondary | ICD-10-CM | POA: Diagnosis not present

## 2022-01-08 DIAGNOSIS — K219 Gastro-esophageal reflux disease without esophagitis: Secondary | ICD-10-CM | POA: Diagnosis present

## 2022-01-08 DIAGNOSIS — F10188 Alcohol abuse with other alcohol-induced disorder: Secondary | ICD-10-CM | POA: Diagnosis not present

## 2022-01-08 DIAGNOSIS — K2920 Alcoholic gastritis without bleeding: Secondary | ICD-10-CM | POA: Diagnosis not present

## 2022-01-08 DIAGNOSIS — E876 Hypokalemia: Secondary | ICD-10-CM | POA: Diagnosis present

## 2022-01-08 DIAGNOSIS — F17210 Nicotine dependence, cigarettes, uncomplicated: Secondary | ICD-10-CM | POA: Diagnosis not present

## 2022-01-08 DIAGNOSIS — Z79899 Other long term (current) drug therapy: Secondary | ICD-10-CM | POA: Insufficient documentation

## 2022-01-08 DIAGNOSIS — E860 Dehydration: Secondary | ICD-10-CM | POA: Diagnosis not present

## 2022-01-08 LAB — LIPASE, BLOOD: Lipase: 21 U/L (ref 11–51)

## 2022-01-08 LAB — CBC
HCT: 33.2 % — ABNORMAL LOW (ref 36.0–46.0)
Hemoglobin: 10.7 g/dL — ABNORMAL LOW (ref 12.0–15.0)
MCH: 27.2 pg (ref 26.0–34.0)
MCHC: 32.2 g/dL (ref 30.0–36.0)
MCV: 84.3 fL (ref 80.0–100.0)
Platelets: 158 10*3/uL (ref 150–400)
RBC: 3.94 MIL/uL (ref 3.87–5.11)
RDW: 17.3 % — ABNORMAL HIGH (ref 11.5–15.5)
WBC: 5.3 10*3/uL (ref 4.0–10.5)
nRBC: 0 % (ref 0.0–0.2)

## 2022-01-08 LAB — URINALYSIS, ROUTINE W REFLEX MICROSCOPIC
Bilirubin Urine: NEGATIVE
Glucose, UA: NEGATIVE mg/dL
Ketones, ur: 20 mg/dL — AB
Leukocytes,Ua: NEGATIVE
Nitrite: NEGATIVE
Protein, ur: 30 mg/dL — AB
Specific Gravity, Urine: 1.023 (ref 1.005–1.030)
pH: 6 (ref 5.0–8.0)

## 2022-01-08 LAB — COMPREHENSIVE METABOLIC PANEL
ALT: 125 U/L — ABNORMAL HIGH (ref 0–44)
AST: 414 U/L — ABNORMAL HIGH (ref 15–41)
Albumin: 3.9 g/dL (ref 3.5–5.0)
Alkaline Phosphatase: 98 U/L (ref 38–126)
Anion gap: 16 — ABNORMAL HIGH (ref 5–15)
BUN: 6 mg/dL (ref 6–20)
CO2: 18 mmol/L — ABNORMAL LOW (ref 22–32)
Calcium: 8.4 mg/dL — ABNORMAL LOW (ref 8.9–10.3)
Chloride: 109 mmol/L (ref 98–111)
Creatinine, Ser: 0.53 mg/dL (ref 0.44–1.00)
GFR, Estimated: >60 mL/min (ref >60)
Glucose, Bld: 144 mg/dL — ABNORMAL HIGH (ref 70–99)
Potassium: 3.5 mmol/L (ref 3.5–5.1)
Sodium: 143 mmol/L (ref 135–145)
Total Bilirubin: 0.5 mg/dL (ref 0.3–1.2)
Total Protein: 7.6 g/dL (ref 6.5–8.1)

## 2022-01-08 LAB — BLOOD GAS, VENOUS
Acid-base deficit: 1.7 mmol/L (ref 0.0–2.0)
Bicarbonate: 21.9 mmol/L (ref 20.0–28.0)
O2 Saturation: 67.8 %
Patient temperature: 37
pCO2, Ven: 33 mmHg — ABNORMAL LOW (ref 44–60)
pH, Ven: 7.43 (ref 7.25–7.43)
pO2, Ven: 43 mmHg (ref 32–45)

## 2022-01-08 LAB — HEPATITIS PANEL, ACUTE
HCV Ab: NONREACTIVE
Hep A IgM: NONREACTIVE
Hep B C IgM: NONREACTIVE
Hepatitis B Surface Ag: NONREACTIVE

## 2022-01-08 LAB — PHOSPHORUS: Phosphorus: 2.9 mg/dL (ref 2.5–4.6)

## 2022-01-08 LAB — BETA-HYDROXYBUTYRIC ACID: Beta-Hydroxybutyric Acid: 0.61 mmol/L — ABNORMAL HIGH (ref 0.05–0.27)

## 2022-01-08 LAB — ACETAMINOPHEN LEVEL: Acetaminophen (Tylenol), Serum: <10 ug/mL — ABNORMAL LOW (ref 10–30)

## 2022-01-08 LAB — MAGNESIUM: Magnesium: 1.3 mg/dL — ABNORMAL LOW (ref 1.7–2.4)

## 2022-01-08 LAB — POC URINE PREG, ED: Preg Test, Ur: NEGATIVE

## 2022-01-08 MED ORDER — MIRTAZAPINE 15 MG PO TABS
15.0000 mg | ORAL_TABLET | Freq: Every day | ORAL | Status: DC
  Administered 2022-01-08: 15 mg via ORAL
  Filled 2022-01-08: qty 1

## 2022-01-08 MED ORDER — LACTATED RINGERS IV BOLUS
2000.0000 mL | Freq: Once | INTRAVENOUS | Status: AC
  Administered 2022-01-08: 2000 mL via INTRAVENOUS

## 2022-01-08 MED ORDER — DROPERIDOL 2.5 MG/ML IJ SOLN
1.2500 mg | Freq: Once | INTRAMUSCULAR | Status: AC
  Administered 2022-01-08: 1.25 mg via INTRAVENOUS
  Filled 2022-01-08: qty 2

## 2022-01-08 MED ORDER — HEPARIN SODIUM (PORCINE) 5000 UNIT/ML IJ SOLN
5000.0000 [IU] | Freq: Three times a day (TID) | INTRAMUSCULAR | Status: DC
  Administered 2022-01-08 – 2022-01-09 (×2): 5000 [IU] via SUBCUTANEOUS
  Filled 2022-01-08 (×2): qty 1

## 2022-01-08 MED ORDER — LORAZEPAM 2 MG/ML IJ SOLN
1.0000 mg | Freq: Once | INTRAMUSCULAR | Status: AC
  Administered 2022-01-08: 1 mg via INTRAVENOUS
  Filled 2022-01-08: qty 1

## 2022-01-08 MED ORDER — PROCHLORPERAZINE EDISYLATE 10 MG/2ML IJ SOLN
10.0000 mg | Freq: Four times a day (QID) | INTRAMUSCULAR | Status: DC | PRN
  Administered 2022-01-09: 10 mg via INTRAVENOUS
  Filled 2022-01-08 (×2): qty 2

## 2022-01-08 MED ORDER — MAGNESIUM SULFATE 2 GM/50ML IV SOLN
2.0000 g | Freq: Once | INTRAVENOUS | Status: AC
  Administered 2022-01-08: 2 g via INTRAVENOUS
  Filled 2022-01-08: qty 50

## 2022-01-08 MED ORDER — DEXTROSE IN LACTATED RINGERS 5 % IV SOLN: INTRAVENOUS | Status: AC

## 2022-01-08 MED ORDER — TRAZODONE HCL 50 MG PO TABS
25.0000 mg | ORAL_TABLET | Freq: Every evening | ORAL | Status: DC | PRN
  Administered 2022-01-08: 25 mg via ORAL
  Filled 2022-01-08: qty 1

## 2022-01-08 MED ORDER — POTASSIUM CHLORIDE CRYS ER 20 MEQ PO TBCR: 40.0000 meq | EXTENDED_RELEASE_TABLET | Freq: Once | ORAL | Status: DC

## 2022-01-08 MED ORDER — PANTOPRAZOLE 80MG IVPB - SIMPLE MED
80.0000 mg | Freq: Once | INTRAVENOUS | Status: AC
  Administered 2022-01-08: 80 mg via INTRAVENOUS
  Filled 2022-01-08: qty 100

## 2022-01-08 MED ORDER — PANTOPRAZOLE SODIUM 40 MG PO TBEC
40.0000 mg | DELAYED_RELEASE_TABLET | Freq: Every day | ORAL | Status: DC
  Administered 2022-01-09: 40 mg via ORAL
  Filled 2022-01-08: qty 1

## 2022-01-08 MED ORDER — SCOPOLAMINE 1 MG/3DAYS TD PT72
1.0000 | MEDICATED_PATCH | TRANSDERMAL | Status: DC
  Administered 2022-01-08: 1.5 mg via TRANSDERMAL
  Filled 2022-01-08: qty 1

## 2022-01-08 MED ORDER — PROMETHAZINE HCL 25 MG PO TABS
25.0000 mg | ORAL_TABLET | ORAL | Status: DC | PRN
  Administered 2022-01-08: 25 mg via ORAL
  Filled 2022-01-08: qty 1

## 2022-01-08 MED ORDER — POTASSIUM CHLORIDE 10 MEQ/100ML IV SOLN
10.0000 meq | INTRAVENOUS | Status: AC
  Administered 2022-01-08 (×4): 10 meq via INTRAVENOUS
  Filled 2022-01-08 (×3): qty 100

## 2022-01-08 MED ORDER — PROCHLORPERAZINE EDISYLATE 10 MG/2ML IJ SOLN
10.0000 mg | Freq: Once | INTRAMUSCULAR | Status: AC
  Administered 2022-01-08: 10 mg via INTRAVENOUS
  Filled 2022-01-08: qty 2

## 2022-01-08 NOTE — ED Notes (Signed)
Patient calling out for assistance to get cleaned up. Patient states she is cold and is shaking. Patient given two warm blankets and changed into a gown.

## 2022-01-08 NOTE — ED Notes (Signed)
Pt given urine cup and wipe and instructed on use for sample. 

## 2022-01-08 NOTE — Assessment & Plan Note (Signed)
Potassium was 3.5 at admission - low normal. Due to h/o hypokalemia in this setting potassium replacement initiated in ED.  Plan F/u Bmet in AM, additional replenishment as needed.

## 2022-01-08 NOTE — ED Notes (Signed)
Pharmacy was messaged to send IV protonix and scopalomine patch.

## 2022-01-08 NOTE — Subjective & Objective (Signed)
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 She has been able to cut back her alcohol consumption but does continue to drink 1-2 16 0z beers a day.   For the past 3 days she has had persistent N/V and now presents to the [facility] ED for evaluation of what she describes as a familiar syndrome.

## 2022-01-08 NOTE — H&P (Signed)
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 She has been able to cut back her alcohol consumption but does continue to drink 1-2 16 0z beers a day.   For the past 3 days she has had persistent N/V and now presents to the [facility] ED for evaluation of what she describes as a familiar syndrome.    ED Course: vitals stable. Exam per EDP non-focal. Lab: glucose 144, K 3.5, Mg 1.3 Ca 8.4, AST 414, ALT 125 Hgb 10.7 (baseline 10.6 to 12.1). [place] no gallstones, no CBD dilitation. Probable fatty liver changes, no focal liver lesions. Patient given IV compazine, droperidol, scopolamine, ativan with moderate control of N/V. TRH called to admit to continue treatment.  Review of Systems:  Review of Systems  Constitutional:  Negative for chills, fever and weight loss.  HENT: Negative.    Eyes: Negative.   Respiratory: Negative.    Cardiovascular:  Negative for chest pain and palpitations.  Gastrointestinal:  Positive for heartburn, nausea and vomiting.  Genitourinary: Negative.   Musculoskeletal: Negative.   Skin: Negative.   Neurological: Negative.   Endo/Heme/Allergies: Negative.   Psychiatric/Behavioral: Negative.      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 Noted during admission 06/2021 assoc w/ palpitations and presyncope.   Tobacco abuse    Transaminitis     Past Surgical History:  Procedure Laterality Date   ESOPHAGOGASTRODUODENOSCOPY N/A 10/29/2020   Procedure: ESOPHAGOGASTRODUODENOSCOPY (EGD);  Surgeon: Wyline Mood, MD;  Location: [facility] ENDOSCOPY;  Service: Gastroenterology;  [name];   ESOPHAGOGASTRODUODENOSCOPY (EGD) WITH PROPOFOL N/A 03/14/2020   Procedure: ESOPHAGOGASTRODUODENOSCOPY (EGD) WITH PROPOFOL;  Surgeon: Pasty Spillers, MD;  [name];  Service: Endoscopy;  [name];   ESOPHAGOGASTRODUODENOSCOPY (EGD) WITH PROPOFOL N/A 02/25/2021   Procedure: ESOPHAGOGASTRODUODENOSCOPY (EGD) WITH PROPOFOL;  Surgeon: Pasty Spillers, MD;  [name];  Service: Endoscopy;  [name];    Soc Hx - married 18 years. Has a 77 y/o daughter. Safe at home.   reports that she has been smoking cigarettes. She has a 2.50 pack-year smoking history. She has never used smokeless tobacco. She reports current alcohol use of about 61.0 standard drinks of alcohol per week. She reports that she does not use drugs.  No Known Allergies  Family History  Problem Relation Age of Onset   Lupus Mother    Prostate cancer Father    Cancer Paternal Aunt        Breast    Cancer Paternal Aunt        Breast     Prior to Admission medications   Medication Sig Start Date End Date Taking? Authorizing Provider  mirtazapine (REMERON) 15  MG tablet Take 15 mg by mouth at bedtime. 08/12/21  Yes [provider]  pantoprazole (PROTONIX) 40 MG tablet Take 40 mg by mouth daily. 08/12/21  Yes [provider]  ibuprofen (ADVIL) 800 MG tablet Take by mouth as needed. Patient not taking: Reported on 01/08/2022 08/12/21   [provider]    Physical Exam: Vitals:   01/08/22 1930 01/08/22 2000 01/08/22 2030 01/08/22 2117  BP: (!) 152/87 (!) 146/92 139/74 (!)  143/84  Pulse: 74 75 77 71  Resp: 12 15 16 18   Temp:    98.1 F (36.7 C)  TempSrc:    Oral  SpO2: 97% 98% 97% 97%    Physical Exam Vitals and nursing note reviewed.  Constitutional:      Comments: Appears thin and sleep deprived  HENT:     Head: Normocephalic and atraumatic.     Nose: Nose normal.     Mouth/Throat:     Mouth: Mucous membranes are moist.  Eyes:     General: No scleral icterus.    Extraocular Movements: Extraocular movements intact.     Conjunctiva/sclera: Conjunctivae normal.     Pupils: Pupils are equal, round, and reactive to light.  Neck:     Vascular: No carotid bruit.  Cardiovascular:     Rate and Rhythm: Normal rate and regular rhythm.     Pulses: Normal pulses.     Heart sounds: Normal heart sounds.  Pulmonary:     Effort: Pulmonary effort is normal.     Breath sounds: Normal breath sounds.  Abdominal:     General: Abdomen is flat. Bowel sounds are normal. There is no distension.     Palpations: Abdomen is soft. There is no mass.     Tenderness: There is no abdominal tenderness. There is no guarding or rebound.  Musculoskeletal:        General: Normal range of motion.     Cervical back: Normal range of motion and neck supple.  Lymphadenopathy:     Cervical: No cervical adenopathy.  Skin:    General: Skin is warm and dry.  Neurological:     General: No focal deficit present.     Mental Status: She is alert and oriented to person, place, and time.  Psychiatric:        Mood and Affect: Mood normal.      Labs on Admission: I have personally reviewed following labs and imaging studies  CBC: Recent Labs  Lab 01/08/22 1240  WBC 5.3  HGB 10.7*  HCT 33.2*  MCV 84.3  PLT 158   Basic Metabolic Panel: Recent Labs  Lab 01/08/22 1240  NA 143  K 3.5  CL 109  CO2 18*  GLUCOSE 144*  BUN 6  CREATININE 0.53  CALCIUM 8.4*  MG 1.3*  PHOS 2.9   GFR: CrCl cannot be calculated (Unknown ideal weight.). Liver Function Tests: Recent Labs   Lab 01/08/22 1240  AST 414*  ALT 125*  ALKPHOS 98  BILITOT 0.5  PROT 7.6  ALBUMIN 3.9   Recent Labs  Lab 01/08/22 1240  LIPASE 21   No results for input(s): "AMMONIA" in the last 168 hours. Coagulation Profile: No results for input(s): "INR", "PROTIME" in the last 168 hours. Cardiac Enzymes: No results for input(s): "CKTOTAL", "CKMB", "CKMBINDEX", "TROPONINI" in the last 168 hours. BNP (last 3 results) No results for input(s): "PROBNP" in the last 8760 hours. HbA1C: No results for input(s): "HGBA1C" in the last 72 hours. CBG: No results  for input(s): "GLUCAP" in the last 168 hours. Lipid Profile: No results for input(s): "CHOL", "HDL", "LDLCALC", "TRIG", "CHOLHDL", "LDLDIRECT" in the last 72 hours. Thyroid Function Tests: No results for input(s): "TSH", "T4TOTAL", "FREET4", "T3FREE", "THYROIDAB" in the last 72 hours. Anemia Panel: No results for input(s): "VITAMINB12", "FOLATE", "FERRITIN", "TIBC", "IRON", "RETICCTPCT" in the last 72 hours. Urine analysis:    Component Value Date/Time   COLORURINE YELLOW (A) 01/08/2022 1410   APPEARANCEUR HAZY (A) 01/08/2022 1410   LABSPEC 1.023 01/08/2022 1410   PHURINE 6.0 01/08/2022 1410   GLUCOSEU NEGATIVE 01/08/2022 1410   HGBUR SMALL (A) 01/08/2022 1410   BILIRUBINUR NEGATIVE 01/08/2022 1410   KETONESUR 20 (A) 01/08/2022 1410   PROTEINUR 30 (A) 01/08/2022 1410   NITRITE NEGATIVE 01/08/2022 1410   LEUKOCYTESUR NEGATIVE 01/08/2022 1410    Radiological Exams on Admission: I have personally reviewed images US ABDOMEN LIMITED RUQ (LIVER/GB)  Result Date: 01/08/2022 CLINICAL DATA:  Transaminitis EXAM: ULTRASOUND ABDOMEN LIMITED RIGHT UPPER QUADRANT COMPARISON:  06/05/2021 FINDINGS: Gallbladder: No gallstones or wall thickening visualized. No sonographic Murphy sign noted by sonographer. Common bile duct: Diameter: 1.3 mm. Liver: No focal lesion identified. Diffusely increased hepatic parenchymal echogenicity. Portal vein is patent  on color Doppler imaging with normal direction of blood flow towards the liver. Other: None. IMPRESSION: The echogenicity of the liver is increased. This is a nonspecific finding but is most commonly seen with fatty infiltration of the liver. There are no obvious focal liver lesions. Electronically Signed   By: Duanne Guess D.O.   On: 01/08/2022 16:19    EKG: I have personally reviewed EKG: NSR, no acute changes  Assessment/Plan Principal Problem:   Refractory nausea and vomiting Active Problems:   Dehydration   Hypokalemia   Elevated LFTs   Alcoholic gastritis   Alcoholic cirrhosis of liver without ascites (HCC)    Assessment and Plan: * Refractory nausea and vomiting Recurrent  Problem related to alcohol related gastritis. In ED she received bolus fluids, scopolamine patch, compazine IV and droperidol but remained weak and with persistent N/V.  Plan Continue IV fluids  Continue anti-emetics with po phenergan, IV compazine if needed and continue scopolamine patch  Alcoholic cirrhosis of liver without ascites (HCC) Elevated LFTs ratio c/w alcoholic hepatits more than cirrhosis. May need outpatient GI f/u.   Plan Encourage abstinence  Alcoholic gastritis Patient has had this problem in the past. She has been proscribed PPI therapy but has not been taking this for a couple of days.  Plan Continue IV protonix until able to take PO protonix  Elevated LFTs Patient with h/o of alcohol elevated of LFTs. 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 Abdominal U/S with changes c/w fatty liver but no focal lesions noted.  Plan Hydration and control of N/V  F/u LFT in AM  Hypokalemia Potassium was 3.5 at admission - low normal. Due to h/o hypokalemia in this setting potassium replacement initiated in ED.  Plan F/u Bmet in AM, additional replenishment as needed.   Dehydration 2/2 to refractory N/V. She received bolus IV fluids in ED  Plan Continue IV hydration with D5LR       DVT  prophylaxis: SQ Heparin Code Status: Full Code Family Communication: patient says husband is informed and asked that he not be called  Disposition Plan: homewhen stable  Consults called: none  Admission status: Observation, Med-Surg   Illene Regulus, MD [facility] 01/08/2022, 10:56 PM

## 2022-01-08 NOTE — ED Provider Notes (Signed)
[facility] Provider Note    Event Date/Time   First MD Initiated Contact with Patient 01/08/22 1400     (approximate)   History   Emesis   HPI  Yvette [name] is a 41 y.o. female with past medical history of tobacco abuse, EtOH abuse stating she drinks about a beer per day most recently yesterday, asthma, anemia, Kawasaki's disease and alcoholic gastritis complicated with several hospitalizations previously and known previous prolonged QTc interval who presents for evaluation of some nonbloody nonbilious vomiting started last night.  She states it feels similar to a gastritis she has had in the past related to alcohol use.  She denies any abdominal pain, back pain, diarrhea, constipation, urinary symptoms, vaginal bleeding or discharge, chest pain, cough, shortness of breath headache or earache.  No recent illegal drugs.  She usually is on Protonix for gastritis but has not been able to keep any down today.  She has not had any antiemetics today.  No other acute concerns at this time.    Physical Exam  Triage Vital Signs: ED Triage Vitals  Enc Vitals Group     BP 01/08/22 1233 130/89     Pulse Rate 01/08/22 1233 93     Resp 01/08/22 1233 19     Temp 01/08/22 1233 98.3 F (36.8 C)     Temp Source 01/08/22 1233 Oral     SpO2 01/08/22 1233 99 %     Weight --      Height --      Head Circumference --      Peak Flow --      Pain Score 01/08/22 1234 2     Pain Loc --      Pain Edu? --      Excl. in GC? --     Most recent vital signs: Vitals:   01/08/22 1630 01/08/22 1700  BP: (!) 153/80 (!) 153/93  Pulse:    Resp:    Temp:    SpO2:      General: Awake, appears mildly uncomfortable. CV:  Good peripheral perfusion.  2+ radial pulses. Resp:  Normal effort.  Clear bilaterally. Abd:  No distention.  Mild tenderness in the epigastrium. Other:  No CVA tenderness.  Dry mucous membranes.   ED Results / Procedures / Treatments  Labs (all labs  ordered are listed, but only abnormal results are displayed) Labs Reviewed  COMPREHENSIVE METABOLIC PANEL - Abnormal; Notable for the following components:      Result Value   CO2 18 (*)    Glucose, Bld 144 (*)    Calcium 8.4 (*)    AST 414 (*)    ALT 125 (*)    Anion gap 16 (*)    All other components within normal limits  CBC - Abnormal; Notable for the following components:   Hemoglobin 10.7 (*)    HCT 33.2 (*)    RDW 17.3 (*)    All other components within normal limits  URINALYSIS, ROUTINE W REFLEX MICROSCOPIC - Abnormal; Notable for the following components:   Color, Urine YELLOW (*)    APPearance HAZY (*)    Hgb urine dipstick SMALL (*)    Ketones, ur 20 (*)    Protein, ur 30 (*)    Bacteria, UA RARE (*)    All other components within normal limits  MAGNESIUM - Abnormal; Notable for the following components:   Magnesium 1.3 (*)    All other components within normal limits  BETA-HYDROXYBUTYRIC ACID - Abnormal; Notable for the following components:   Beta-Hydroxybutyric Acid 0.61 (*)    All other components within normal limits  BLOOD GAS, VENOUS - Abnormal; Notable for the following components:   pCO2, Ven 33 (*)    All other components within normal limits  ACETAMINOPHEN LEVEL - Abnormal; Notable for the following components:   Acetaminophen (Tylenol), Serum <10 (*)    All other components within normal limits  LIPASE, BLOOD  PHOSPHORUS  HEPATITIS PANEL, ACUTE  POC URINE PREG, ED     EKG  EKG is remarkable for sinus rhythm with ventricular rate of 90, normal axis, QTc interval of 44 with otherwise unremarkable intervals without clear evidence of acute ischemia or significant arrhythmia.   RADIOLOGY  Right upper quadrant ultrasound my interpretation shows nondilated common bile duct, gallstones or thickening of the gallbladder.  I reviewed radiology interpretation and agree with the findings of increased echogenicity of the liver without other acute  process.   PROCEDURES:  Critical Care performed: No  .1-3 Lead EKG Interpretation  Performed by: Gilles Chiquito, MD Authorized by: Gilles Chiquito, MD     Interpretation: normal     ECG rate assessment: normal     Rhythm: sinus rhythm     Ectopy: none     Conduction: normal       MEDICATIONS ORDERED IN ED: Medications  scopolamine (TRANSDERM-SCOP) 1 MG/3DAYS 1.5 mg (1.5 mg Transdermal Patch Applied 01/08/22 1500)  dextrose 5 % in lactated ringers infusion (0 mLs Intravenous Paused 01/08/22 1821)  potassium chloride 10 mEq in 100 mL IVPB (10 mEq Intravenous New Bag/Given 01/08/22 1818)  lactated ringers bolus 2,000 mL (0 mLs Intravenous Stopped 01/08/22 1727)  pantoprazole (PROTONIX) 80 mg /NS 100 mL IVPB (0 mg Intravenous Stopped 01/08/22 1530)  LORazepam (ATIVAN) injection 1 mg (1 mg Intravenous Given 01/08/22 1440)  magnesium sulfate IVPB 2 g 50 mL (0 g Intravenous Stopped 01/08/22 1727)  droperidol (INAPSINE) 2.5 MG/ML injection 1.25 mg (1.25 mg Intravenous Given 01/08/22 1725)  magnesium sulfate IVPB 2 g 50 mL (2 g Intravenous New Bag/Given 01/08/22 1816)  prochlorperazine (COMPAZINE) injection 10 mg (10 mg Intravenous Given 01/08/22 1817)     IMPRESSION / MDM / ASSESSMENT AND PLAN / ED COURSE  I reviewed the triage vital signs and the nursing notes. Patient's presentation is most consistent with acute presentation with potential threat to life or bodily function.                               Differential diagnosis includes, but is not limited to current alcoholic gastritis, pancreatitis, cholecystitis, hepatitis versus possible infectious gastritis associate with electrolyte derangements and kidney injury.  She does appear slightly hydrated on exam.  CBC without leukocytosis hemoglobin of 10.7 compared to 10.64 months ago.  Normal platelets.  CMP is remarkable for bicarb of 18, AST of 414, ALT of 125 and anion gap of 16.  BUN is 6.  I suspect some mild starvation ketosis in  the setting of ongoing alcohol abuse.  I suspect patient's transaminitis is related to her alcohol use.  She is denying any recent Tylenol use.  We will check Tylenol level and hepatitis panel.  Also obtain a right upper quadrant ultrasound to assess for any evidence of cholecystitis or choledocholithiasis although she does not otherwise have evidence of cholestatic process on her CMP.  Lipase is WNL and not suggestive of pancreatitis.  Tylenol level is negative.  Pregnancy test is negative.  UA shows some ketones and protein rare bacteria but no other clear evidence of infection.  There is small hemoglobin.  VBG with a pH of 7.43 with a PCO2 of 33 and bicarb of 21.9.  Magnesium is 1.3.  The level is negative.  Right upper quadrant ultrasound my interpretation shows nondilated common bile duct, gallstones or thickening of the gallbladder.  I reviewed radiology interpretation and agree with the findings of increased echogenicity of the liver without other acute process.  After receiving 2 L of LR placed patient on D5 LR infusion.  We will aggressively repeat magnesium and give some supplemental potassium as well to get a closer to 4.  After 1 mg of Ativan, placement of scopolamine patch, droperidol and Compazine patient is still too nauseous to drink.  I will admit for intractable nausea and vomiting in the setting of what I suspect is acute alcoholic gastritis and hepatitis.     FINAL CLINICAL IMPRESSION(S) / ED DIAGNOSES   Final diagnoses:  Nausea and vomiting, unspecified vomiting type  Transaminitis  Acute alcoholic gastritis without hemorrhage  Hypomagnesemia     Rx / DC Orders   ED Discharge Orders     None        Note:  This document was prepared using Dragon voice recognition software and may include unintentional dictation errors.   Gilles Chiquito, MD 01/08/22 [phone]

## 2022-01-08 NOTE — Assessment & Plan Note (Signed)
Recurrent  Problem related to alcohol related gastritis. In ED she received bolus fluids, scopolamine patch, compazine IV and droperidol but remained weak and with persistent N/V.  Plan Continue IV fluids  Continue anti-emetics with po phenergan, IV compazine if needed and continue scopolamine patch

## 2022-01-08 NOTE — Assessment & Plan Note (Signed)
Patient has had this problem in the past. She has been proscribed PPI therapy but has not been taking this for a couple of days.  Plan Continue IV protonix until able to take PO protonix

## 2022-01-08 NOTE — ED Triage Notes (Signed)
Patient arrived by EMS from home with c/o N/V since last night around 10pm. Has been out of her reflux medication. 20G left forearm given 4mg  zofran by EMS.

## 2022-01-08 NOTE — Assessment & Plan Note (Signed)
2/2 to refractory N/V. She received bolus IV fluids in ED  Plan Continue IV hydration with D5LR

## 2022-01-08 NOTE — ED Notes (Signed)
Pt resting peacefully in bed, NO vomitting observed.

## 2022-01-08 NOTE — Assessment & Plan Note (Signed)
Patient with h/o of alcohol elevated of LFTs. 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 Abdominal U/S with changes c/w fatty liver but no focal lesions noted.  Plan Hydration and control of N/V  F/u LFT in AM

## 2022-01-08 NOTE — ED Notes (Signed)
Pt states drinks 1 (40 oz beer daily). Provider has seen pt at bedside. Pt began vomiting 10pm last night. Denies blood in vomit. Denies abdominal pain. Last drink yesterday morning.

## 2022-01-08 NOTE — Assessment & Plan Note (Signed)
Elevated LFTs ratio c/w alcoholic hepatits more than cirrhosis. May need outpatient GI f/u.   Plan Encourage abstinence

## 2022-01-08 NOTE — ED Provider Triage Note (Signed)
Emergency Medicine Provider Triage Evaluation Note  Yvette [name] , a 41 y.o. female  was evaluated in triage.  Pt complains of nausea and vomiting since 10pm last night. No fever or diarrhea. No abdominal pain.   Physical Exam  BP 130/89 (BP Location: Left Arm)   Pulse 93   Temp 98.3 F (36.8 C) (Oral)   Resp 19   SpO2 99%  Gen:   Awake, no distress   Resp:  Normal effort  MSK:   Moves extremities without difficulty  Other:    Medical Decision Making  Medically screening exam initiated at 12:35 PM.  Appropriate orders placed.  Yvette [name] was informed that the remainder of the evaluation will be completed by another provider, this initial triage assessment does not replace that evaluation, and the importance of remaining in the ED until their evaluation is complete.    Yvette Pester, FNP 01/08/22 1409

## 2022-01-08 NOTE — ED Triage Notes (Addendum)
Pt c/o N/V since 10 pm last night. Pt denies eating something different, denies flu/covid exposure, states she's been here multiple times for same complaint and it has not improved. Pt is AOX4, NAD noted. See also initial triage note. Pt states zofran does not give relief. Pt heaving in triage.

## 2022-01-09 DIAGNOSIS — R112 Nausea with vomiting, unspecified: Secondary | ICD-10-CM | POA: Diagnosis not present

## 2022-01-09 DIAGNOSIS — K7030 Alcoholic cirrhosis of liver without ascites: Secondary | ICD-10-CM

## 2022-01-09 DIAGNOSIS — K2920 Alcoholic gastritis without bleeding: Secondary | ICD-10-CM

## 2022-01-09 LAB — MAGNESIUM: Magnesium: 2.6 mg/dL — ABNORMAL HIGH (ref 1.7–2.4)

## 2022-01-09 LAB — BASIC METABOLIC PANEL
Anion gap: 7 (ref 5–15)
BUN: <5 mg/dL — ABNORMAL LOW (ref 6–20)
CO2: 25 mmol/L (ref 22–32)
Calcium: 8.9 mg/dL (ref 8.9–10.3)
Chloride: 107 mmol/L (ref 98–111)
Creatinine, Ser: 0.57 mg/dL (ref 0.44–1.00)
GFR, Estimated: >60 mL/min (ref >60)
Glucose, Bld: 93 mg/dL (ref 70–99)
Potassium: 4.1 mmol/L (ref 3.5–5.1)
Sodium: 139 mmol/L (ref 135–145)

## 2022-01-09 LAB — HIV ANTIBODY (ROUTINE TESTING W REFLEX): HIV Screen 4th Generation wRfx: NONREACTIVE

## 2022-01-09 MED ORDER — SUCRALFATE 1 GM/10ML PO SUSP: 1.0000 g | Freq: Three times a day (TID) | ORAL | Status: DC

## 2022-01-09 NOTE — Discharge Summary (Signed)
Physician Discharge Summary   Patient: Yvette [name] MRN: [id number] DOB: January 26, 1981  Admit date:     01/08/2022  Discharge date: 01/09/22  Discharge Physician: Marrion Coy   PCP: [facility]   Recommendations at discharge:   Follow-up with PCP in 1 week.  Discharge Diagnoses: Principal Problem:   Refractory nausea and vomiting Active Problems:   Dehydration   Hypokalemia   Elevated LFTs   Alcoholic gastritis   Alcoholic cirrhosis of liver without ascites (HCC)   Hypomagnesemia  Resolved Problems:   Intractable vomiting with nausea   GERD (gastroesophageal reflux disease)  Hospital Course: Yvette [name], a 41 y/o, with h/o multiple admissions for alcohol related gastritis with refractory N/V along with transaminitis. 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 She has been able to cut back her alcohol consumption but does continue to drink 1-2 16 0z beers a day.    For the past 3 days she has had persistent N/V and now presents to the [facility] ED for evaluation of what she describes as a familiar syndrome.   After arriving the hospital, patient was found to have mild hypokalemia and a significant hypomagnesemia.  Given potassium and magnesium.  Patient was also given fluids for dehydration. Condition had improved this morning, no nausea vomiting.  Tolerating diet.  Potassium is normalized, magnesium 2.6.  Patient is medically stable to be discharged. Patient is advised to follow-up with PCP in 1 week Assessment and Plan: Retractable nausea vomiting secondary to gastritis induced by alcohol. Alcoholic liver cirrhosis without ascites. Alcoholic gastritis Patient was given fluids and Protonix.  Condition has improved.  Medically stable to be discharged  Hypokalemia. Hypomagnesemia. Dehydration. Condition had improved.  Alcohol use disorder. Patient is strongly encouraged to  quit alcohol drinking       Consultants: None Procedures performed: None  Disposition: Home Diet recommendation:  Discharge Diet Orders (From admission, onward)     Start     Ordered   01/09/22 0000  Diet - low sodium heart healthy        01/09/22 0952           Cardiac diet DISCHARGE MEDICATION: Allergies as of 01/09/2022   Not on File      Medication List     STOP taking these medications    ibuprofen 800 MG tablet Commonly known as: ADVIL       TAKE these medications    mirtazapine 15 MG tablet Commonly known as: REMERON Take 15 mg by mouth at bedtime.   pantoprazole 40 MG tablet Commonly known as: PROTONIX Take 40 mg by mouth daily.        Follow-up Information     [facility] Follow up in 1 week(s).   Contact information: [address] [place] [address] [phone]         Iran Ouch, MD .   Specialty: Cardiology Contact information: [address] [place] [address] [phone]                Discharge Exam: There were no vitals filed for this visit. General exam: Appears calm and comfortable  Respiratory system: Clear to auscultation. Respiratory effort normal. Cardiovascular system: S1 & S2 heard, RRR. No JVD, murmurs, rubs, gallops or clicks. No pedal edema. Gastrointestinal system: Abdomen is nondistended, soft and nontender. No organomegaly or masses felt. Normal bowel sounds heard. Central nervous system: Alert and oriented. No  focal neurological deficits. Extremities: Symmetric 5 x 5 power. Skin: No rashes, lesions or ulcers Psychiatry: Judgement and insight appear normal. Mood & affect appropriate.    Condition at discharge: good  The results of significant diagnostics from this hospitalization (including imaging, microbiology, ancillary and laboratory) are listed below for reference.   Imaging Studies: US ABDOMEN LIMITED RUQ (LIVER/GB)  Result Date:  01/08/2022 CLINICAL DATA:  Transaminitis EXAM: ULTRASOUND ABDOMEN LIMITED RIGHT UPPER QUADRANT COMPARISON:  06/05/2021 FINDINGS: Gallbladder: No gallstones or wall thickening visualized. No sonographic Murphy sign noted by sonographer. Common bile duct: Diameter: 1.3 mm. Liver: No focal lesion identified. Diffusely increased hepatic parenchymal echogenicity. Portal vein is patent on color Doppler imaging with normal direction of blood flow towards the liver. Other: None. IMPRESSION: The echogenicity of the liver is increased. This is a nonspecific finding but is most commonly seen with fatty infiltration of the liver. There are no obvious focal liver lesions. Electronically Signed   By: Duanne Guess D.O.   On: 01/08/2022 16:19    Microbiology: Results for orders placed or performed during the hospital encounter of 06/28/21  Resp Panel by RT-PCR (Flu A&B, Covid) Nasopharyngeal Swab     Status: None   Collection Time: 06/28/21  2:25 PM   Specimen: Nasopharyngeal Swab; Nasopharyngeal(NP) swabs in vial transport medium  Result Value Ref Range Status   SARS Coronavirus 2 by RT PCR NEGATIVE NEGATIVE Final    Comment: (NOTE) SARS-CoV-2 target nucleic acids are NOT DETECTED.  The SARS-CoV-2 RNA is generally detectable in upper respiratory specimens during the acute phase of infection. The lowest concentration of SARS-CoV-2 viral copies this assay can detect is 138 copies/mL. A negative result does not preclude SARS-Cov-2 infection and should not be used as the sole basis for treatment or other patient management decisions. A negative result may occur with  improper specimen collection/handling, submission of specimen other than nasopharyngeal swab, presence of viral mutation(s) within the areas targeted by this assay, and inadequate number of viral copies(<138 copies/mL). A negative result must be combined with clinical observations, patient history, and epidemiological information. The expected  result is Negative.  Fact Sheet for Patients:  BloggerCourse.com  Fact Sheet for Healthcare Providers:  SeriousBroker.it  This test is no t yet approved or cleared by the [place] FDA and  has been authorized for detection and/or diagnosis of SARS-CoV-2 by FDA under an Emergency Use Authorization (EUA). This EUA will remain  in effect (meaning this test can be used) for the duration of the COVID-19 declaration under Section 564(b)(1) of the Act, 21 U.S.C.section 360bbb-3(b)(1), unless the authorization is terminated  or revoked sooner.       Influenza A by PCR NEGATIVE NEGATIVE Final   Influenza B by PCR NEGATIVE NEGATIVE Final    Comment: (NOTE) The Xpert Xpress SARS-CoV-2/FLU/RSV plus assay is intended as an aid in the diagnosis of influenza from Nasopharyngeal swab specimens and should not be used as a sole basis for treatment. Nasal washings and aspirates are unacceptable for Xpert Xpress SARS-CoV-2/FLU/RSV testing.  Fact Sheet for Patients: BloggerCourse.com  Fact Sheet for Healthcare Providers: SeriousBroker.it  This test is not yet approved or cleared by the [place] FDA and has been authorized for detection and/or diagnosis of SARS-CoV-2 by FDA under an Emergency Use Authorization (EUA). This EUA will remain in effect (meaning this test can be used) for the duration of the COVID-19 declaration under Section 564(b)(1) of the Act, 21 U.S.C. section 360bbb-3(b)(1), unless the authorization is terminated  or revoked.  Performed at [facility], [address] [place], [place] [address]   MRSA Next Gen by PCR, Nasal     Status: None   Collection Time: 06/30/21 11:19 AM   Specimen: Nasal Mucosa; Nasal Swab  Result Value Ref Range Status   MRSA by PCR Next Gen NOT DETECTED NOT DETECTED Final    Comment: (NOTE) The GeneXpert MRSA Assay (FDA approved  for NASAL specimens only), is one component of a comprehensive MRSA colonization surveillance program. It is not intended to diagnose MRSA infection nor to guide or monitor treatment for MRSA infections. Test performance is not FDA approved in patients less than 80 years old. Performed at [facility], [address] [place], [place] [address]     Labs: CBC: Recent Labs  Lab 01/08/22 1240  WBC 5.3  HGB 10.7*  HCT 33.2*  MCV 84.3  PLT 158   Basic Metabolic Panel: Recent Labs  Lab 01/08/22 1240 01/09/22 0601  NA 143 139  K 3.5 4.1  CL 109 107  CO2 18* 25  GLUCOSE 144* 93  BUN 6 <5*  CREATININE 0.53 0.57  CALCIUM 8.4* 8.9  MG 1.3* 2.6*  PHOS 2.9  --    Liver Function Tests: Recent Labs  Lab 01/08/22 1240  AST 414*  ALT 125*  ALKPHOS 98  BILITOT 0.5  PROT 7.6  ALBUMIN 3.9   CBG: No results for input(s): "GLUCAP" in the last 168 hours.  Discharge time spent: less than 30 minutes.  Signed: Marrion Coy, MD [facility] 01/09/2022

## 2022-01-09 NOTE — Progress Notes (Signed)
Patient being discharged home. IV removed. Went over discharge instructions with patient. Patient stated that she understood. Patient going home POV with family member.

## 2022-01-10 ENCOUNTER — Emergency Department
Admission: EM | Admit: 2022-01-10 | Discharge: 2022-01-10 | Disposition: A | Discharge status: Home / Self Care | Payer: Medicaid Other | Attending: Emergency Medicine | Admitting: Emergency Medicine

## 2022-01-10 ENCOUNTER — Other Ambulatory Visit: Payer: Self-pay

## 2022-01-10 ENCOUNTER — Encounter: Payer: Self-pay | Admitting: Emergency Medicine

## 2022-01-10 DIAGNOSIS — R112 Nausea with vomiting, unspecified: Secondary | ICD-10-CM | POA: Diagnosis present

## 2022-01-10 DIAGNOSIS — R1013 Epigastric pain: Secondary | ICD-10-CM | POA: Insufficient documentation

## 2022-01-10 LAB — LIPASE, BLOOD: Lipase: 23 U/L (ref 11–51)

## 2022-01-10 LAB — COMPREHENSIVE METABOLIC PANEL
ALT: 93 U/L — ABNORMAL HIGH (ref 0–44)
AST: 169 U/L — ABNORMAL HIGH (ref 15–41)
Albumin: 4.8 g/dL (ref 3.5–5.0)
Alkaline Phosphatase: 98 U/L (ref 38–126)
Anion gap: 10 (ref 5–15)
BUN: 6 mg/dL (ref 6–20)
CO2: 26 mmol/L (ref 22–32)
Calcium: 9.4 mg/dL (ref 8.9–10.3)
Chloride: 103 mmol/L (ref 98–111)
Creatinine, Ser: 0.71 mg/dL (ref 0.44–1.00)
GFR, Estimated: >60 mL/min (ref >60)
Glucose, Bld: 96 mg/dL (ref 70–99)
Potassium: 3.4 mmol/L — ABNORMAL LOW (ref 3.5–5.1)
Sodium: 139 mmol/L (ref 135–145)
Total Bilirubin: 1 mg/dL (ref 0.3–1.2)
Total Protein: 8.7 g/dL — ABNORMAL HIGH (ref 6.5–8.1)

## 2022-01-10 LAB — CBC WITH DIFFERENTIAL/PLATELET
Abs Immature Granulocytes: 0.02 10*3/uL (ref 0.00–0.07)
Basophils Absolute: 0 10*3/uL (ref 0.0–0.1)
Basophils Relative: 0 %
Eosinophils Absolute: 0 10*3/uL (ref 0.0–0.5)
Eosinophils Relative: 1 %
HCT: 36.2 % (ref 36.0–46.0)
Hemoglobin: 11.5 g/dL — ABNORMAL LOW (ref 12.0–15.0)
Immature Granulocytes: 1 %
Lymphocytes Relative: 15 %
Lymphs Abs: 0.5 10*3/uL — ABNORMAL LOW (ref 0.7–4.0)
MCH: 27.4 pg (ref 26.0–34.0)
MCHC: 31.8 g/dL (ref 30.0–36.0)
MCV: 86.2 fL (ref 80.0–100.0)
Monocytes Absolute: 0.4 10*3/uL (ref 0.1–1.0)
Monocytes Relative: 12 %
Neutro Abs: 2.2 10*3/uL (ref 1.7–7.7)
Neutrophils Relative %: 71 %
Platelets: 123 10*3/uL — ABNORMAL LOW (ref 150–400)
RBC: 4.2 MIL/uL (ref 3.87–5.11)
RDW: 17 % — ABNORMAL HIGH (ref 11.5–15.5)
WBC: 3.1 10*3/uL — ABNORMAL LOW (ref 4.0–10.5)
nRBC: 0 % (ref 0.0–0.2)

## 2022-01-10 LAB — AMMONIA: Ammonia: 38 umol/L — ABNORMAL HIGH (ref 9–35)

## 2022-01-10 MED ORDER — PROMETHAZINE HCL 25 MG PO TABS
12.5000 mg | ORAL_TABLET | Freq: Once | ORAL | Status: AC
  Administered 2022-01-10: 12.5 mg via ORAL
  Filled 2022-01-10: qty 1

## 2022-01-10 MED ORDER — PROMETHAZINE HCL 25 MG RE SUPP
25.0000 mg | Freq: Once | RECTAL | Status: AC
Start: 2022-01-10 — End: 2022-01-10
  Administered 2022-01-10: 25 mg via RECTAL
  Filled 2022-01-10: qty 1

## 2022-01-10 MED ORDER — PROMETHAZINE HCL 25 MG RE SUPP: 25.0000 mg | Freq: Four times a day (QID) | RECTAL | 0 refills | Status: DC | PRN

## 2022-01-10 MED ORDER — LACTATED RINGERS IV BOLUS
1000.0000 mL | Freq: Once | INTRAVENOUS | Status: AC
  Administered 2022-01-10: 1000 mL via INTRAVENOUS

## 2022-01-10 MED ORDER — METOCLOPRAMIDE HCL 5 MG/ML IJ SOLN
10.0000 mg | Freq: Once | INTRAMUSCULAR | Status: AC
  Administered 2022-01-10: 10 mg via INTRAVENOUS
  Filled 2022-01-10: qty 2

## 2022-01-10 MED ORDER — SODIUM CHLORIDE 0.9 % IV BOLUS
1000.0000 mL | Freq: Once | INTRAVENOUS | Status: AC
Start: 2022-01-10 — End: 2022-01-10
  Administered 2022-01-10: 1000 mL via INTRAVENOUS

## 2022-01-10 MED ORDER — DROPERIDOL 2.5 MG/ML IJ SOLN
2.5000 mg | Freq: Once | INTRAMUSCULAR | Status: AC
  Administered 2022-01-10: 2.5 mg via INTRAVENOUS
  Filled 2022-01-10 (×2): qty 2

## 2022-01-10 MED ORDER — ONDANSETRON 4 MG PO TBDP
4.0000 mg | ORAL_TABLET | Freq: Once | ORAL | Status: AC
Start: 2022-01-10 — End: 2022-01-10
  Administered 2022-01-10: 4 mg via ORAL
  Filled 2022-01-10: qty 1

## 2022-01-10 MED ORDER — ONDANSETRON 8 MG PO TBDP: 8.0000 mg | ORAL_TABLET | Freq: Three times a day (TID) | ORAL | 0 refills | Status: DC | PRN

## 2022-01-10 NOTE — ED Notes (Signed)
Attempted IV placement X2. Pt discharged yesterday. [facility] hospital paper scrub pants. States has vomited 5 times since this morning when she tried to drink water. Denies ETOH ingestion since discharge.

## 2022-01-10 NOTE — ED Provider Notes (Signed)
[facility] Provider Note   Event Date/Time   First MD Initiated Contact with Patient 01/10/22 [phone]     (approximate) History  Nausea and Emesis  HPI Yvette [name] is a 41 y.o. female  Location: Epigastric region Duration: Began this morning Timing: Stable since onset Severity: Severe Quality: Burning pain Context: Patient states she was recently discharged yesterday after an episode of alcoholic gastritis with nausea/vomiting.  Patient states that when she got home she did not have any antiemetic medicine and began to vomit again this morning Modifying factors: Any p.o. intake worsens her symptoms and she denies any relieving factors Associated Symptoms: Nausea/vomiting ROS: Patient currently denies any vision changes, tinnitus, difficulty speaking, facial droop, sore throat, chest pain, shortness of breath, diarrhea, dysuria, or weakness/numbness/paresthesias in any extremity   Physical Exam  Triage Vital Signs: ED Triage Vitals  Enc Vitals Group     BP 01/10/22 0920 (!) 176/102     Pulse Rate 01/10/22 0920 89     Resp 01/10/22 0920 20     Temp 01/10/22 0920 98.8 F (37.1 C)     Temp Source 01/10/22 0920 Oral     SpO2 01/10/22 0920 100 %     Weight 01/10/22 0916 113 lb 1.5 oz (51.3 kg)     Height 01/10/22 0916 5\' 2"  (1.575 m)     Head Circumference --      Peak Flow --      Pain Score 01/10/22 0916 0     Pain Loc --      Pain Edu? --      Excl. in GC? --    Most recent vital signs: Vitals:   01/10/22 1800 01/10/22 2009  BP: (!) 174/91 (!) 147/97  Pulse: (!) 49 68  Resp:  17  Temp:    SpO2: 100% 100%   General: Awake, oriented x4. CV:  Good peripheral perfusion.  Resp:  Normal effort.  Abd:  No distention.  Other:  Middle-aged African-American female laying in bed in no acute distress3 ED Results / Procedures / Treatments  Labs (all labs ordered are listed, but only abnormal results are displayed) Labs Reviewed  COMPREHENSIVE  METABOLIC PANEL - Abnormal; Notable for the following components:      Result Value   Potassium 3.4 (*)    Total Protein 8.7 (*)    AST 169 (*)    ALT 93 (*)    All other components within normal limits  CBC WITH DIFFERENTIAL/PLATELET - Abnormal; Notable for the following components:   WBC 3.1 (*)    Hemoglobin 11.5 (*)    RDW 17.0 (*)    Platelets 123 (*)    Lymphs Abs 0.5 (*)    All other components within normal limits  AMMONIA - Abnormal; Notable for the following components:   Ammonia 38 (*)    All other components within normal limits  LIPASE, BLOOD   EKG ED ECG REPORT I, 03/13/22, the attending physician, personally viewed and interpreted this ECG. Date: 01/10/2022 EKG Time: 1017 Rate: 56 Rhythm: normal sinus rhythm QRS Axis: normal Intervals: normal ST/T Wave abnormalities: normal Narrative Interpretation: no evidence of acute ischemia PROCEDURES: Critical Care performed: No Procedures MEDICATIONS ORDERED IN ED: Medications  sodium chloride 0.9 % bolus 1,000 mL (0 mLs Intravenous Stopped 01/10/22 1151)  droperidol (INAPSINE) 2.5 MG/ML injection 2.5 mg (2.5 mg Intravenous Given 01/10/22 1159)  metoCLOPramide (REGLAN) injection 10 mg (10 mg Intravenous Given 01/10/22 1454)  lactated ringers bolus 1,000 mL (0 mLs Intravenous Stopped 01/10/22 1614)  promethazine (PHENERGAN) tablet 12.5 mg (12.5 mg Oral Given 01/10/22 1702)  promethazine (PHENERGAN) suppository 25 mg (25 mg Rectal Given 01/10/22 1818)  ondansetron (ZOFRAN-ODT) disintegrating tablet 4 mg (4 mg Oral Given 01/10/22 2006)   IMPRESSION / MDM / ASSESSMENT AND PLAN / ED COURSE  I reviewed the triage vital signs and the nursing notes.                             The patient is on the cardiac monitor to evaluate for evidence of arrhythmia and/or significant heart rate changes. Patient's presentation is most consistent with acute presentation with potential threat to life or bodily function. Patient presents for  acute nausea/vomiting The cause of the patients symptoms is not clear, but the patient is overall well appearing and is suspected to have a transient course of illness.  Given History and Exam there does not appear to be an emergent cause of the symptoms such as small bowel obstruction, coronary syndrome, bowel ischemia, DKA, pancreatitis, appendicitis, other acute abdomen or other emergent problem.  Reassessment: After treatment, the patient is feeling much better, tolerating PO fluids, and shows no signs of dehydration.   Disposition: Discharge home with prompt primary care physician follow up in the next 48 hours. Strict return precautions discussed.   FINAL CLINICAL IMPRESSION(S) / ED DIAGNOSES   Final diagnoses:  Nausea and vomiting, unspecified vomiting type   Rx / DC Orders   ED Discharge Orders          Ordered    ondansetron (ZOFRAN-ODT) 8 MG disintegrating tablet  Every 8 hours PRN        01/10/22 1509    promethazine (PHENERGAN) 25 MG suppository  Every 6 hours PRN        01/10/22 1952           Note:  This document was prepared using Dragon voice recognition software and may include unintentional dictation errors.   Merwyn Katos, MD 01/17/22 [phone]

## 2022-01-10 NOTE — ED Notes (Signed)
EKG handed to provider  ?

## 2022-01-10 NOTE — ED Triage Notes (Signed)
Pt reports has acid reflux real bad and was here recently for the same NV issue. Pt states started again this am. Denies pain, reports only NV

## 2022-01-10 NOTE — Discharge Instructions (Addendum)
Please seek medical attention for any high fevers, chest pain, shortness of breath, change in behavior, persistent vomiting, bloody stool or any other new or concerning symptoms.  

## 2022-02-11 ENCOUNTER — Ambulatory Visit: Payer: Medicaid Other | Admitting: Cardiovascular Disease

## 2022-03-17 ENCOUNTER — Ambulatory Visit: Payer: Medicaid Other | Admitting: Nurse Practitioner

## 2022-03-30 ENCOUNTER — Inpatient Hospital Stay: Payer: Medicaid Other

## 2022-03-30 ENCOUNTER — Other Ambulatory Visit: Payer: Self-pay

## 2022-03-30 ENCOUNTER — Inpatient Hospital Stay
Admission: EM | Admit: 2022-03-30 | Discharge: 2022-04-06 | DRG: 308 | Disposition: A | Discharge status: Home / Self Care | Payer: Medicaid Other | Attending: Internal Medicine | Admitting: Internal Medicine

## 2022-03-30 DIAGNOSIS — F1094 Alcohol use, unspecified with alcohol-induced mood disorder: Secondary | ICD-10-CM | POA: Diagnosis not present

## 2022-03-30 DIAGNOSIS — D696 Thrombocytopenia, unspecified: Secondary | ICD-10-CM | POA: Diagnosis present

## 2022-03-30 DIAGNOSIS — D72819 Decreased white blood cell count, unspecified: Secondary | ICD-10-CM | POA: Diagnosis present

## 2022-03-30 DIAGNOSIS — R001 Bradycardia, unspecified: Secondary | ICD-10-CM | POA: Diagnosis not present

## 2022-03-30 DIAGNOSIS — E861 Hypovolemia: Secondary | ICD-10-CM | POA: Diagnosis present

## 2022-03-30 DIAGNOSIS — F1910 Other psychoactive substance abuse, uncomplicated: Secondary | ICD-10-CM | POA: Diagnosis not present

## 2022-03-30 DIAGNOSIS — F1024 Alcohol dependence with alcohol-induced mood disorder: Secondary | ICD-10-CM | POA: Diagnosis not present

## 2022-03-30 DIAGNOSIS — I472 Ventricular tachycardia, unspecified: Secondary | ICD-10-CM | POA: Diagnosis present

## 2022-03-30 DIAGNOSIS — I462 Cardiac arrest due to underlying cardiac condition: Secondary | ICD-10-CM | POA: Diagnosis present

## 2022-03-30 DIAGNOSIS — J9601 Acute respiratory failure with hypoxia: Secondary | ICD-10-CM

## 2022-03-30 DIAGNOSIS — Z7141 Alcohol abuse counseling and surveillance of alcoholic: Secondary | ICD-10-CM

## 2022-03-30 DIAGNOSIS — I5022 Chronic systolic (congestive) heart failure: Secondary | ICD-10-CM | POA: Diagnosis not present

## 2022-03-30 DIAGNOSIS — F17210 Nicotine dependence, cigarettes, uncomplicated: Secondary | ICD-10-CM | POA: Diagnosis present

## 2022-03-30 DIAGNOSIS — I420 Dilated cardiomyopathy: Secondary | ICD-10-CM

## 2022-03-30 DIAGNOSIS — I4901 Ventricular fibrillation: Secondary | ICD-10-CM | POA: Diagnosis not present

## 2022-03-30 DIAGNOSIS — I469 Cardiac arrest, cause unspecified: Secondary | ICD-10-CM | POA: Diagnosis not present

## 2022-03-30 DIAGNOSIS — U071 COVID-19: Secondary | ICD-10-CM

## 2022-03-30 DIAGNOSIS — Z793 Long term (current) use of hormonal contraceptives: Secondary | ICD-10-CM

## 2022-03-30 DIAGNOSIS — G9341 Metabolic encephalopathy: Secondary | ICD-10-CM | POA: Diagnosis present

## 2022-03-30 DIAGNOSIS — I426 Alcoholic cardiomyopathy: Secondary | ICD-10-CM | POA: Diagnosis present

## 2022-03-30 DIAGNOSIS — K7030 Alcoholic cirrhosis of liver without ascites: Secondary | ICD-10-CM | POA: Diagnosis present

## 2022-03-30 DIAGNOSIS — R45851 Suicidal ideations: Secondary | ICD-10-CM | POA: Diagnosis not present

## 2022-03-30 DIAGNOSIS — K2920 Alcoholic gastritis without bleeding: Secondary | ICD-10-CM | POA: Diagnosis present

## 2022-03-30 DIAGNOSIS — E876 Hypokalemia: Secondary | ICD-10-CM | POA: Diagnosis present

## 2022-03-30 DIAGNOSIS — F10931 Alcohol use, unspecified with withdrawal delirium: Secondary | ICD-10-CM | POA: Diagnosis not present

## 2022-03-30 DIAGNOSIS — I5023 Acute on chronic systolic (congestive) heart failure: Secondary | ICD-10-CM | POA: Diagnosis not present

## 2022-03-30 DIAGNOSIS — R571 Hypovolemic shock: Secondary | ICD-10-CM | POA: Diagnosis not present

## 2022-03-30 DIAGNOSIS — F1010 Alcohol abuse, uncomplicated: Secondary | ICD-10-CM | POA: Diagnosis present

## 2022-03-30 DIAGNOSIS — I5A Non-ischemic myocardial injury (non-traumatic): Secondary | ICD-10-CM | POA: Diagnosis present

## 2022-03-30 DIAGNOSIS — R0603 Acute respiratory distress: Secondary | ICD-10-CM | POA: Diagnosis not present

## 2022-03-30 DIAGNOSIS — Z781 Physical restraint status: Secondary | ICD-10-CM

## 2022-03-30 DIAGNOSIS — K766 Portal hypertension: Secondary | ICD-10-CM | POA: Diagnosis present

## 2022-03-30 DIAGNOSIS — R112 Nausea with vomiting, unspecified: Principal | ICD-10-CM

## 2022-03-30 DIAGNOSIS — F1020 Alcohol dependence, uncomplicated: Secondary | ICD-10-CM | POA: Diagnosis present

## 2022-03-30 DIAGNOSIS — F10231 Alcohol dependence with withdrawal delirium: Secondary | ICD-10-CM | POA: Diagnosis not present

## 2022-03-30 DIAGNOSIS — J80 Acute respiratory distress syndrome: Secondary | ICD-10-CM | POA: Diagnosis present

## 2022-03-30 DIAGNOSIS — Z79899 Other long term (current) drug therapy: Secondary | ICD-10-CM

## 2022-03-30 LAB — MAGNESIUM
Magnesium: 1.3 mg/dL — ABNORMAL LOW (ref 1.7–2.4)
Magnesium: 1.2 mg/dL — ABNORMAL LOW (ref 1.7–2.4)

## 2022-03-30 LAB — URINALYSIS, ROUTINE W REFLEX MICROSCOPIC
Bilirubin Urine: NEGATIVE
Bilirubin Urine: NEGATIVE
Glucose, UA: 50 mg/dL — AB
Glucose, UA: NEGATIVE mg/dL
Hgb urine dipstick: NEGATIVE
Ketones, ur: NEGATIVE mg/dL
Ketones, ur: 20 mg/dL — AB
Leukocytes,Ua: NEGATIVE
Nitrite: NEGATIVE
Nitrite: NEGATIVE
Protein, ur: 100 mg/dL — AB
Protein, ur: 100 mg/dL — AB
Specific Gravity, Urine: 1.01 (ref 1.005–1.030)
Specific Gravity, Urine: 1.028 (ref 1.005–1.030)
pH: 5 (ref 5.0–8.0)
pH: 6 (ref 5.0–8.0)

## 2022-03-30 LAB — COMPREHENSIVE METABOLIC PANEL
ALT: 80 U/L — ABNORMAL HIGH (ref 0–44)
ALT: 96 U/L — ABNORMAL HIGH (ref 0–44)
AST: 229 U/L — ABNORMAL HIGH (ref 15–41)
AST: 424 U/L — ABNORMAL HIGH (ref 15–41)
Albumin: 4.9 g/dL (ref 3.5–5.0)
Albumin: 3.4 g/dL — ABNORMAL LOW (ref 3.5–5.0)
Alkaline Phosphatase: 109 U/L (ref 38–126)
Alkaline Phosphatase: 80 U/L (ref 38–126)
Anion gap: 17 — ABNORMAL HIGH (ref 5–15)
Anion gap: 15 (ref 5–15)
BUN: 8 mg/dL (ref 6–20)
BUN: 7 mg/dL (ref 6–20)
CO2: 22 mmol/L (ref 22–32)
CO2: 19 mmol/L — ABNORMAL LOW (ref 22–32)
Calcium: 9.3 mg/dL (ref 8.9–10.3)
Calcium: 7.3 mg/dL — ABNORMAL LOW (ref 8.9–10.3)
Chloride: 100 mmol/L (ref 98–111)
Chloride: 106 mmol/L (ref 98–111)
Creatinine, Ser: 0.81 mg/dL (ref 0.44–1.00)
Creatinine, Ser: 0.87 mg/dL (ref 0.44–1.00)
GFR, Estimated: >60 mL/min (ref >60)
GFR, Estimated: >60 mL/min (ref >60)
Glucose, Bld: 150 mg/dL — ABNORMAL HIGH (ref 70–99)
Glucose, Bld: 170 mg/dL — ABNORMAL HIGH (ref 70–99)
Potassium: 3.4 mmol/L — ABNORMAL LOW (ref 3.5–5.1)
Potassium: 3.4 mmol/L — ABNORMAL LOW (ref 3.5–5.1)
Sodium: 139 mmol/L (ref 135–145)
Sodium: 140 mmol/L (ref 135–145)
Total Bilirubin: 0.8 mg/dL (ref 0.3–1.2)
Total Bilirubin: 0.8 mg/dL (ref 0.3–1.2)
Total Protein: 9.1 g/dL — ABNORMAL HIGH (ref 6.5–8.1)
Total Protein: 6.7 g/dL (ref 6.5–8.1)

## 2022-03-30 LAB — CBC WITH DIFFERENTIAL/PLATELET
Abs Immature Granulocytes: 0.05 10*3/uL (ref 0.00–0.07)
Basophils Absolute: 0 10*3/uL (ref 0.0–0.1)
Basophils Relative: 0 %
Eosinophils Absolute: 0 10*3/uL (ref 0.0–0.5)
Eosinophils Relative: 0 %
HCT: 30.1 % — ABNORMAL LOW (ref 36.0–46.0)
Hemoglobin: 9.8 g/dL — ABNORMAL LOW (ref 12.0–15.0)
Immature Granulocytes: 1 %
Lymphocytes Relative: 3 %
Lymphs Abs: 0.2 10*3/uL — ABNORMAL LOW (ref 0.7–4.0)
MCH: 26.7 pg (ref 26.0–34.0)
MCHC: 32.6 g/dL (ref 30.0–36.0)
MCV: 82 fL (ref 80.0–100.0)
Monocytes Absolute: 0.5 10*3/uL (ref 0.1–1.0)
Monocytes Relative: 8 %
Neutro Abs: 5.4 10*3/uL (ref 1.7–7.7)
Neutrophils Relative %: 88 %
Platelets: 105 10*3/uL — ABNORMAL LOW (ref 150–400)
RBC: 3.67 MIL/uL — ABNORMAL LOW (ref 3.87–5.11)
RDW: 16.4 % — ABNORMAL HIGH (ref 11.5–15.5)
WBC: 6.1 10*3/uL (ref 4.0–10.5)
nRBC: 0 % (ref 0.0–0.2)

## 2022-03-30 LAB — BRAIN NATRIURETIC PEPTIDE: B Natriuretic Peptide: 11.2 pg/mL (ref 0.0–100.0)

## 2022-03-30 LAB — TROPONIN I (HIGH SENSITIVITY)
Troponin I (High Sensitivity): 5 ng/L (ref <18)
Troponin I (High Sensitivity): 24 ng/L — ABNORMAL HIGH (ref <18)
Troponin I (High Sensitivity): 62 ng/L — ABNORMAL HIGH (ref <18)

## 2022-03-30 LAB — BLOOD GAS, ARTERIAL
Acid-base deficit: 1.4 mmol/L (ref 0.0–2.0)
Acid-base deficit: 10.7 mmol/L — ABNORMAL HIGH (ref 0.0–2.0)
Bicarbonate: 18.7 mmol/L — ABNORMAL LOW (ref 20.0–28.0)
Bicarbonate: 20.3 mmol/L (ref 20.0–28.0)
FIO2: 40 %
FIO2: 40 %
MECHVT: 450 mL
Mechanical Rate: 15
Mechanical Rate: 20
O2 Saturation: 97.5 %
O2 Saturation: 98.6 %
PEEP: 5 cmH2O
PEEP: 5 cmH2O
Patient temperature: 37
Patient temperature: 37
pCO2 arterial: 26 mmHg — ABNORMAL LOW (ref 32–48)
pCO2 arterial: 55 mmHg — ABNORMAL HIGH (ref 32–48)
pH, Arterial: 7.14 — CL (ref 7.35–7.45)
pH, Arterial: 7.5 — ABNORMAL HIGH (ref 7.35–7.45)
pO2, Arterial: 122 mmHg — ABNORMAL HIGH (ref 83–108)
pO2, Arterial: 192 mmHg — ABNORMAL HIGH (ref 83–108)

## 2022-03-30 LAB — PROTIME-INR
INR: 1.2 (ref 0.8–1.2)
INR: 1.2 (ref 0.8–1.2)
Prothrombin Time: 15.5 seconds — ABNORMAL HIGH (ref 11.4–15.2)
Prothrombin Time: 14.6 seconds (ref 11.4–15.2)

## 2022-03-30 LAB — URINE DRUG SCREEN, QUALITATIVE (ARMC ONLY)
Amphetamines, Ur Screen: NOT DETECTED
Barbiturates, Ur Screen: NOT DETECTED
Benzodiazepine, Ur Scrn: NOT DETECTED
Cannabinoid 50 Ng, Ur ~~LOC~~: NOT DETECTED
Cocaine Metabolite,Ur ~~LOC~~: NOT DETECTED
MDMA (Ecstasy)Ur Screen: NOT DETECTED
Methadone Scn, Ur: NOT DETECTED
Opiate, Ur Screen: NOT DETECTED
Phencyclidine (PCP) Ur S: NOT DETECTED
Tricyclic, Ur Screen: NOT DETECTED

## 2022-03-30 LAB — CBC
HCT: 36.6 % (ref 36.0–46.0)
Hemoglobin: 11.8 g/dL — ABNORMAL LOW (ref 12.0–15.0)
MCH: 26 pg (ref 26.0–34.0)
MCHC: 32.2 g/dL (ref 30.0–36.0)
MCV: 80.8 fL (ref 80.0–100.0)
Platelets: 137 10*3/uL — ABNORMAL LOW (ref 150–400)
RBC: 4.53 MIL/uL (ref 3.87–5.11)
RDW: 16.4 % — ABNORMAL HIGH (ref 11.5–15.5)
WBC: 3.6 10*3/uL — ABNORMAL LOW (ref 4.0–10.5)
nRBC: 0 % (ref 0.0–0.2)

## 2022-03-30 LAB — LACTIC ACID, PLASMA
Lactic Acid, Venous: 1.9 mmol/L (ref 0.5–1.9)
Lactic Acid, Venous: 3.6 mmol/L (ref 0.5–1.9)

## 2022-03-30 LAB — ETHANOL: Alcohol, Ethyl (B): <10 mg/dL (ref <10)

## 2022-03-30 LAB — PHOSPHORUS
Phosphorus: 2.8 mg/dL (ref 2.5–4.6)
Phosphorus: 4.1 mg/dL (ref 2.5–4.6)

## 2022-03-30 LAB — MRSA NEXT GEN BY PCR, NASAL: MRSA by PCR Next Gen: NOT DETECTED

## 2022-03-30 LAB — SARS CORONAVIRUS 2 BY RT PCR: SARS Coronavirus 2 by RT PCR: POSITIVE — AB

## 2022-03-30 LAB — LIPASE, BLOOD: Lipase: 22 U/L (ref 11–51)

## 2022-03-30 LAB — PROCALCITONIN: Procalcitonin: <0.1 ng/mL

## 2022-03-30 LAB — POC URINE PREG, ED: Preg Test, Ur: NEGATIVE

## 2022-03-30 LAB — SALICYLATE LEVEL: Salicylate Lvl: <7 mg/dL — ABNORMAL LOW (ref 7.0–30.0)

## 2022-03-30 LAB — ACETAMINOPHEN LEVEL: Acetaminophen (Tylenol), Serum: <10 ug/mL — ABNORMAL LOW (ref 10–30)

## 2022-03-30 MED ORDER — MAGNESIUM SULFATE 2 GM/50ML IV SOLN
2.0000 g | Freq: Once | INTRAVENOUS | Status: AC
  Administered 2022-03-30: 2 g via INTRAVENOUS
  Filled 2022-03-30: qty 50

## 2022-03-30 MED ORDER — INSULIN ASPART 100 UNIT/ML IJ SOLN
0.0000 [IU] | INTRAMUSCULAR | Status: DC
  Administered 2022-03-31 (×3): 2 [IU] via SUBCUTANEOUS
  Administered 2022-03-31: 5 [IU] via SUBCUTANEOUS
  Administered 2022-04-03 – 2022-04-04 (×2): 2 [IU] via SUBCUTANEOUS
  Filled 2022-03-30 (×6): qty 1

## 2022-03-30 MED ORDER — SODIUM CHLORIDE 0.9% FLUSH
3.0000 mL | INTRAVENOUS | Status: DC | PRN
  Administered 2022-04-04: 3 mL via INTRAVENOUS

## 2022-03-30 MED ORDER — POLYETHYLENE GLYCOL 3350 17 G PO PACK: 17.0000 g | PACK | Freq: Every day | ORAL | Status: DC | PRN

## 2022-03-30 MED ORDER — SODIUM CHLORIDE 0.9 % IV BOLUS
1000.0000 mL | Freq: Once | INTRAVENOUS | Status: AC
  Administered 2022-03-30: 1000 mL via INTRAVENOUS

## 2022-03-30 MED ORDER — ACETAMINOPHEN 325 MG PO TABS: 650.0000 mg | ORAL_TABLET | ORAL | Status: DC | PRN

## 2022-03-30 MED ORDER — ORAL CARE MOUTH RINSE: 15.0000 mL | OROMUCOSAL | Status: DC | PRN

## 2022-03-30 MED ORDER — POLYETHYLENE GLYCOL 3350 17 G PO PACK: 17.0000 g | PACK | Freq: Every day | ORAL | Status: DC

## 2022-03-30 MED ORDER — LORAZEPAM 2 MG/ML IJ SOLN
INTRAMUSCULAR | Status: AC
  Administered 2022-03-30: 1 mg via INTRAVENOUS
  Filled 2022-03-30: qty 1

## 2022-03-30 MED ORDER — LACTATED RINGERS IV BOLUS
1000.0000 mL | Freq: Once | INTRAVENOUS | Status: AC
  Administered 2022-03-30: 1000 mL via INTRAVENOUS

## 2022-03-30 MED ORDER — ORAL CARE MOUTH RINSE
15.0000 mL | OROMUCOSAL | Status: DC
  Administered 2022-03-31 (×5): 15 mL via OROMUCOSAL

## 2022-03-30 MED ORDER — DOCUSATE SODIUM 50 MG/5ML PO LIQD
100.0000 mg | Freq: Two times a day (BID) | ORAL | Status: DC
  Administered 2022-03-30: 100 mg
  Filled 2022-03-30 (×2): qty 10

## 2022-03-30 MED ORDER — POTASSIUM CHLORIDE 10 MEQ/100ML IV SOLN
10.0000 meq | Freq: Once | INTRAVENOUS | Status: DC
  Filled 2022-03-30: qty 100

## 2022-03-30 MED ORDER — ONDANSETRON HCL 4 MG/2ML IJ SOLN: 4.0000 mg | Freq: Four times a day (QID) | INTRAMUSCULAR | Status: DC | PRN

## 2022-03-30 MED ORDER — DOCUSATE SODIUM 50 MG/5ML PO LIQD: 100.0000 mg | Freq: Two times a day (BID) | ORAL | Status: DC | PRN

## 2022-03-30 MED ORDER — DOCUSATE SODIUM 100 MG PO CAPS: 100.0000 mg | ORAL_CAPSULE | Freq: Two times a day (BID) | ORAL | Status: DC | PRN

## 2022-03-30 MED ORDER — LORAZEPAM 2 MG/ML IJ SOLN
1.0000 mg | Freq: Once | INTRAMUSCULAR | Status: AC
  Filled 2022-03-30: qty 1

## 2022-03-30 MED ORDER — LORAZEPAM 2 MG/ML IJ SOLN
1.0000 mg | Freq: Once | INTRAMUSCULAR | Status: AC
  Administered 2022-03-30: 1 mg via INTRAVENOUS

## 2022-03-30 MED ORDER — MIDAZOLAM HCL 2 MG/2ML IJ SOLN
2.0000 mg | INTRAMUSCULAR | Status: DC | PRN
  Administered 2022-03-30 – 2022-03-31 (×3): 2 mg via INTRAVENOUS
  Filled 2022-03-30 (×3): qty 2

## 2022-03-30 MED ORDER — SODIUM CHLORIDE 0.9 % IV SOLN
12.5000 mg | Freq: Four times a day (QID) | INTRAVENOUS | Status: DC | PRN
  Administered 2022-03-30: 12.5 mg via INTRAVENOUS
  Filled 2022-03-30: qty 12.5

## 2022-03-30 MED ORDER — SODIUM BICARBONATE 8.4 % IV SOLN
50.0000 meq | Freq: Once | INTRAVENOUS | Status: AC
  Administered 2022-03-30: 50 meq via INTRAVENOUS
  Filled 2022-03-30: qty 50

## 2022-03-30 MED ORDER — VITAMIN C 500 MG PO TABS
500.0000 mg | ORAL_TABLET | Freq: Every day | ORAL | Status: DC
  Administered 2022-03-31: 500 mg
  Filled 2022-03-30: qty 1

## 2022-03-30 MED ORDER — SODIUM CHLORIDE 0.9 % IV SOLN
3.0000 g | Freq: Four times a day (QID) | INTRAVENOUS | Status: DC
  Administered 2022-03-31 (×2): 3 g via INTRAVENOUS
  Filled 2022-03-30 (×3): qty 8

## 2022-03-30 MED ORDER — IOHEXOL 350 MG/ML SOLN
100.0000 mL | Freq: Once | INTRAVENOUS | Status: AC | PRN
  Administered 2022-03-30: 75 mL via INTRAVENOUS

## 2022-03-30 MED ORDER — SODIUM CHLORIDE 0.9% FLUSH
3.0000 mL | Freq: Two times a day (BID) | INTRAVENOUS | Status: DC
  Administered 2022-03-31 – 2022-04-06 (×12): 3 mL via INTRAVENOUS

## 2022-03-30 MED ORDER — ZINC SULFATE 220 (50 ZN) MG PO CAPS
220.0000 mg | ORAL_CAPSULE | Freq: Every day | ORAL | Status: DC
  Administered 2022-03-31: 220 mg
  Filled 2022-03-30: qty 1

## 2022-03-30 MED ORDER — DROPERIDOL 2.5 MG/ML IJ SOLN
2.5000 mg | Freq: Once | INTRAMUSCULAR | Status: AC
  Administered 2022-03-30: 2.5 mg via INTRAVENOUS
  Filled 2022-03-30: qty 2

## 2022-03-30 MED ORDER — METOCLOPRAMIDE HCL 5 MG/ML IJ SOLN
10.0000 mg | Freq: Once | INTRAMUSCULAR | Status: AC
  Administered 2022-03-30: 10 mg via INTRAVENOUS
  Filled 2022-03-30: qty 2

## 2022-03-30 MED ORDER — POTASSIUM CHLORIDE 10 MEQ/100ML IV SOLN
10.0000 meq | INTRAVENOUS | Status: AC
  Administered 2022-03-30 (×2): 10 meq via INTRAVENOUS
  Filled 2022-03-30 (×2): qty 100

## 2022-03-30 MED ORDER — FOLIC ACID 1 MG PO TABS
1.0000 mg | ORAL_TABLET | Freq: Every day | ORAL | Status: DC
  Administered 2022-03-31: 1 mg
  Filled 2022-03-30: qty 1

## 2022-03-30 MED ORDER — MIDAZOLAM HCL 2 MG/2ML IJ SOLN
2.0000 mg | INTRAMUSCULAR | Status: DC | PRN
  Filled 2022-03-30: qty 2

## 2022-03-30 MED ORDER — PREDNISONE 20 MG PO TABS: 50.0000 mg | ORAL_TABLET | Freq: Every day | ORAL | Status: DC

## 2022-03-30 MED ORDER — ADULT MULTIVITAMIN W/MINERALS CH
1.0000 | ORAL_TABLET | Freq: Every day | ORAL | Status: DC
  Administered 2022-03-31: 1
  Filled 2022-03-30: qty 1

## 2022-03-30 MED ORDER — MIDAZOLAM HCL (PF) 10 MG/2ML IJ SOLN
INTRAMUSCULAR | Status: AC
  Administered 2022-03-30: 2 mg
  Filled 2022-03-30: qty 2

## 2022-03-30 MED ORDER — NOREPINEPHRINE 4 MG/250ML-% IV SOLN: 0.0000 ug/min | INTRAVENOUS | Status: DC

## 2022-03-30 MED ORDER — PANTOPRAZOLE 2 MG/ML SUSPENSION: 40.0000 mg | Freq: Every day | ORAL | Status: DC

## 2022-03-30 MED ORDER — CHLORHEXIDINE GLUCONATE CLOTH 2 % EX PADS
6.0000 | MEDICATED_PAD | Freq: Every day | CUTANEOUS | Status: DC
  Administered 2022-03-30 – 2022-04-06 (×4): 6 via TOPICAL

## 2022-03-30 MED ORDER — DIPHENHYDRAMINE HCL 50 MG/ML IJ SOLN
25.0000 mg | Freq: Once | INTRAMUSCULAR | Status: AC
  Administered 2022-03-30: 25 mg via INTRAVENOUS
  Filled 2022-03-30: qty 1

## 2022-03-30 MED ORDER — PROPOFOL 1000 MG/100ML IV EMUL: 5.0000 ug/kg/min | INTRAVENOUS | Status: DC

## 2022-03-30 MED ORDER — METHYLPREDNISOLONE SODIUM SUCC 40 MG IJ SOLR
0.5000 mg/kg | Freq: Two times a day (BID) | INTRAMUSCULAR | Status: DC
  Administered 2022-03-31: 25.6 mg via INTRAVENOUS
  Filled 2022-03-30: qty 1

## 2022-03-30 MED ORDER — PANTOPRAZOLE SODIUM 40 MG IV SOLR
40.0000 mg | Freq: Every day | INTRAVENOUS | Status: DC
  Administered 2022-03-30: 40 mg via INTRAVENOUS
  Filled 2022-03-30: qty 10

## 2022-03-30 MED ORDER — SODIUM CHLORIDE 0.9 % IV SOLN: 250.0000 mL | INTRAVENOUS | Status: DC | PRN

## 2022-03-30 MED ORDER — MOLNUPIRAVIR EUA 200MG CAPSULE: 4.0000 | ORAL_CAPSULE | Freq: Two times a day (BID) | ORAL | Status: DC

## 2022-03-30 MED ORDER — THIAMINE MONONITRATE 100 MG PO TABS
100.0000 mg | ORAL_TABLET | Freq: Every day | ORAL | Status: DC
  Administered 2022-03-31: 100 mg
  Filled 2022-03-30: qty 1

## 2022-03-30 MED ORDER — SODIUM BICARBONATE 8.4 % IV SOLN
50.0000 meq | Freq: Once | INTRAVENOUS | Status: AC
  Administered 2022-03-31: 50 meq via INTRAVENOUS
  Filled 2022-03-30: qty 50

## 2022-03-30 MED ORDER — MAGNESIUM SULFATE 4 GM/100ML IV SOLN
4.0000 g | Freq: Once | INTRAVENOUS | Status: AC
  Administered 2022-03-30: 4 g via INTRAVENOUS
  Filled 2022-03-30: qty 100

## 2022-03-30 MED ORDER — FENTANYL 2500MCG IN NS 250ML (10MCG/ML) PREMIX INFUSION
0.0000 ug/h | INTRAVENOUS | Status: DC
  Administered 2022-03-31: 200 ug/h via INTRAVENOUS
  Filled 2022-03-30 (×2): qty 250

## 2022-03-30 NOTE — ED Provider Notes (Signed)
.Critical Care  Performed by: Sharman Cheek, MD Authorized by: Sharman Cheek, MD   Critical care provider statement:    Critical care time (minutes):  35   Critical care time was exclusive of:  Separately billable procedures and treating other patients   Critical care was necessary to treat or prevent imminent or life-threatening deterioration of the following conditions:  Respiratory failure, cardiac failure, circulatory failure, CNS failure or compromise and shock   Critical care was time spent personally by me on the following activities:  Development of treatment plan with patient or surrogate, discussions with consultants, evaluation of patient's response to treatment, examination of patient, obtaining history from patient or surrogate, ordering and performing treatments and interventions, ordering and review of laboratory studies, ordering and review of radiographic studies, pulse oximetry, re-evaluation of patient's condition and review of old charts   Care discussed with: admitting provider   Comments:        Procedure Name: Intubation Date/Time: 03/30/2022 6:55 PM  Performed by: Sharman Cheek, MDPre-anesthesia Checklist: Patient identified, Patient being monitored, Emergency Drugs available, Timeout performed and Suction available Oxygen Delivery Method: Ambu bag Preoxygenation: Pre-oxygenation with 100% oxygen Induction Type: Rapid sequence Ventilation: Mask ventilation without difficulty Laryngoscope Size: Glidescope and 3 Grade View: Grade I Tube size: 7.5 mm Number of attempts: 1 Placement Confirmation: ETT inserted through vocal cords under direct vision, CO2 detector and Breath sounds checked- equal and bilateral Secured at: 22 cm Tube secured with: ETT holder Dental Injury: Teeth and Oropharynx as per pre-operative assessment  Comments:       OG placement  Date/Time: 03/30/2022 6:56 PM  Performed by: Sharman Cheek, MD Authorized by: Sharman Cheek, MD  Consent: The procedure was performed in an emergent situation. Local anesthesia used: no  Anesthesia: Local anesthesia used: no  Sedation: Patient sedated: comatose.  Patient tolerance: patient tolerated the procedure well with no immediate complications Comments:      CPR  Date/Time: 03/30/2022 6:56 PM  Performed by: Sharman Cheek, MD Authorized by: Sharman Cheek, MD  CPR Procedure Details:      Amount of time prior to administration of ACLS/BLS (minutes):  0   ACLS/BLS initiated by EMS: No     CPR/ACLS performed in the ED: Yes     Duration of CPR (minutes):  10   Outcome: ROSC obtained    CPR performed via ACLS guidelines under my direct supervision.  See RN documentation for details including defibrillator use, medications, doses and timing. Comments:        .Cardioversion  Date/Time: 03/30/2022 7:00 PM  Performed by: Sharman Cheek, MD Authorized by: Sharman Cheek, MD   Consent:    Consent obtained:  Emergent situation Pre-procedure details:    Cardioversion basis:  Emergent   Pre-procedure rhythm: ventricular fibrillation.   Electrode placement:  Anterior-posterior Patient sedated: No Attempt one:    Cardioversion mode:  Asynchronous   Waveform:  Biphasic   Shock (Joules):  200   Shock outcome:  Conversion to ventricular tachycardia Attempt two:    Cardioversion mode:  Synchronous   Waveform:  Biphasic   Shock (Joules):  200   Shock outcome:  Conversion to other rhythm (sinus tachycardia rate 105) Post-procedure details:    Patient status:  Unresponsive   Patient tolerance of procedure:  Tolerated well, no immediate complications Comments:           Clinical Course as of 03/30/22 1854  Tue Mar 30, 2022  1320 HCT: 36.6 [BS]    Clinical  Course User Index [BS] Teodoro Spray, PA    ----------------------------------------- 6:54 PM on 03/30/2022 ----------------------------------------- See code documentation.  RN Witnessed In-hospital arrest while undergoing ED treatment, ACLS started immediately, ROSC obtained. Pt received: 1mg  epinephrine IV 300mg  Amiodarone IV bolus 50mg  Rocuronium IV Electric defibrillation biphasic 200J for v. fib Synchronized cardioversion 200J for v. Sharyne Richters, MD 03/30/22 1905

## 2022-03-30 NOTE — ED Provider Notes (Signed)
Patient moved from fast track to room 26.  Patient had come in with nausea and vomiting.  Patient had asked for her usual droperidol when the Phenergan did not work.  She was given this and within a few minutes became unconscious.  She stopped breathing.  She was noted not to have a pulse.  CPR was started patient was found to be in V-fib and was V-fib..  Patient then went into V. tach and was cardioverted.  Patient had gotten about 9 minutes of CPR and 1 mg of epi.  When patient gets to the major side we contact ICU.  Patient's initial blood pressure was low but she was very tachycardic she had gotten rocuronium but no sedation.  We therefore gave her some Ativan and fluids blood pressure came up very rapidly heart rate went from 162 down to 140s.  Discussed patient with ICU doctor Dr. Juliann Mule who because of the patient's initial hypotension asked [place] not to give her propofol which was very wise but to use fentanyl.  Fentanyl drip was ordered patient was titrated slowly initially and then more rapidly once it became convinced that she was not sensitive to fentanyl.  Heart rate has not come down much more currently we will have to titrate her up further with the fentanyl.  Initial EKG done on the major side showed what appears to be sinus tach with the P waves fairly buried in the QRS interval normal axis no obvious acute ST-T changes EKG done several minutes later showed sinus tachycardia at 144 normal axis no acute ST-T changes.  We will titrate the patient further with the fentanyl and she is going to go to CT here shortly.   Nena Polio, MD 03/30/22 [phone]

## 2022-03-30 NOTE — Consult Note (Signed)
Pharmacy Antibiotic Note  Yvette [name] is a 41 y.o. female admitted on 03/30/2022 with pneumonia.  Pharmacy has been consulted for unasyn dosing.  Plan: Initiate Unasyn 3g IV q6h  Height: 5\' 2"  (157.5 cm) Weight: 51.2 kg (112 lb 14 oz) IBW/kg (Calculated) : 50.1  Temp (24hrs), Avg:98.3 F (36.8 C), Min:98.1 F (36.7 C), Max:98.6 F (37 C)  Recent Labs  Lab 03/30/22 1242 03/30/22 2021 03/30/22 2154  WBC 3.6*  --  6.1  CREATININE 0.81  --  0.87  LATICACIDVEN  --  1.9  --     Estimated Creatinine Clearance: 69.3 mL/min (by C-G formula based on SCr of 0.87 mg/dL).    No Known Allergies  Antimicrobials this admission: Unasyn (9/19 >>  Molnupiravir (9/19 >>   Dose adjustments this admission: CTM and adjust PRN  Microbiology results: No cultures collected 9/19 MRSA PCR: negative  9/19 Covid PCR: positive  Thank you for allowing pharmacy to be a part of this patient's care.  Shanon Brow Javeon Macmurray 03/30/2022 11:09 PM

## 2022-03-30 NOTE — ED Triage Notes (Signed)
First Nurse Note;  Pt via EMS from home. Pt has a hx of acid reflux. Pt c/o NV since midnight last night. Has taken her medication with no relief. Pt states she has been throwing clear liquid. Denies abd pain. Pt is A&Ox4 and NAD  134/76 97 % on RA 90 HR  Afebrile

## 2022-03-30 NOTE — ED Triage Notes (Signed)
Pt states she is not having abd pain just soreness from vomiting

## 2022-03-30 NOTE — ED Notes (Signed)
Narrative note:  This nurse assumed care of pt in room 26 after pt was intubated. Dr. Cinda Quest, Resp Therapist, charge nurse, ED tech and this nurse in room. Pt Tachy at 150s and placed on a fentanyl drip as ordered by Dr. Cinda Quest. Multiple nurses attempted IV access and blood draw with no success. Berenda Morale Rust, NP at bedside to insert central line. Successful insertion. Pt was taken to CT with RT and this nurse for CTA and head CT. Pt began to buck at the vent during CT and was given 2mg  Versed as ordered by Rust if needed. Pt was then taken to ICU room 10.

## 2022-03-30 NOTE — ED Notes (Signed)
Narrative note -

## 2022-03-30 NOTE — ED Provider Notes (Signed)
----------------------------------------- 4:10 PM on 03/30/2022 -----------------------------------------  Blood pressure (!) 140/104, pulse 95, temperature 98.1 F (36.7 C), temperature source Oral, resp. rate 17, height 5\' 2"  (1.575 m), weight 55 kg, SpO2 98 %.  Assuming care from [facility], [facility].  In short, Yvette [name] is a 41 y.o. female with a chief complaint of Abdominal Pain (Abd pain and vomiting started last night ) .  Refer to the original H&P for additional details.  The current plan of care is to await medication administration to see if this improves patient's symptoms.  Patient presents for nausea and vomiting.  Patient has a longstanding history of this including alcoholic cirrhosis, etoh abuse that is still current, transaminitis..  Received handoff from previous provider, awaiting medication administration of Phenergan and fluids to see if this improves patient's symptoms.  She does have elevated LFTs but no elevation in white blood cell count.  Patient declined imaging according to the previous provider.  At this time we are awaiting medication trial to see if patient is stable for discharge..    At 1750 hrs. I discussed the patient's symptoms with her..  Patient states that she was still nauseated and vomiting.  At this time we had a lengthy discussion regarding whether we would admit the patient for ongoing antiemetic control versus try 1 more round of medication in hopes that the patient improved symptomatically.  Patient states that she would like to try another round of medication first.  When discussing medication options, patient has received droperidol in the past multiple times with good success.  She would like to try this again.  As patient is reporting ongoing emesis in the setting of alcoholic cirrhosis I felt that fluids, potassium, and droperidol would be administered to the patient.  ----------------------------------------- 6:59 PM on  03/30/2022 ----------------------------------------- After our discussion, patient had proceeded to use the restroom, and nursing had started the patient's medications.  Patient had her fluids hung, given 2.5 mg of droperidol, and potassium was being started when she had a witnessed cardiac arrest in the room.  Myself, attending provider, Dr. 04/01/2022 both entered the room to find patient in cardiac arrest with no respiratory effort.  No palpable pulse and CPR was initiated.  Patient was placed on the [facility] device, bagged ventilations were provided to the patient, pads placed on the patient.  Patient was noted to be in V-fib and was defibrillated.  Patient exited V-fib, went into a tachycardic rhythm that appeared to be wide complex.  Patient had a synchronous cardioversion with improved rate from 230 down to 130s.  Patient had paralytic administered by Dr. [facility] with RSI.  As we initiated CPR, 1 mg of epi was administered.  Patient entered wide-complex tachycardia 300 mg of amiodarone was administered.  Patient was given 50 mg of rocuronium for RSI.  Patient has return of pulse, is currently intubated.  Patient was moved from room 49 to room 26.  I provided handoff to Dr. Scotty Court.  Advised Dr. Darnelle Catalan that the patient had come in for nausea vomiting, has been seen in this department multiple times over the past year or two for similar complaints.  Was seen by previous provider today and was awaiting symptom improvement with medications. Patient had been worked up by previous provider with labs but patient had declined any imaging.  Prior to this patient also been attempted to be placed on the monitor by nursing staff but states that every time she vomited it would pull and she declined  monitor, pulse ox.  I discussed our resuscitative efforts at this time, patients history, todays labs with Dr. Cinda Quest.  Patient care will be transferred to Dr. Cinda Quest at this time.   .Critical Care  Performed by:  Darletta Moll, PA-C Authorized by: Darletta Moll, PA-C   Critical care provider statement:    Critical care time (minutes):  40   Critical care time was exclusive of:  Separately billable procedures and treating other patients and teaching time   Critical care was necessary to treat or prevent imminent or life-threatening deterioration of the following conditions:  Circulatory failure and cardiac failure   Critical care was time spent personally by me on the following activities:  Obtaining history from patient or surrogate, examination of patient, ordering and performing treatments and interventions, ordering and review of laboratory studies, ordering and review of radiographic studies, pulse oximetry, re-evaluation of patient's condition and review of old charts     Guerry Minors Jarome Lamas 03/30/22 1923    Carrie Mew, MD 03/30/22 2332

## 2022-03-30 NOTE — ED Notes (Addendum)
Immediately after medicating pt with 2.5 of droperidol per MAR, pt slumped over in bed. This RN caught pt before she fell out of the bed and sat her up straight into high fowlers position. CT tech also at bedside called for help from provider. Dr Joni Fears to bedside. This RN hooking pt up to monitoring and while looking at pt, noticed she stopped breathing. This RN to head of bed and airway secured, started to bag pt. Dr Joni Fears performing pulse check, no pulse. Dr Joni Fears performing CPR at this time.   Additional staff to room, pt placed on code cart monitor. RT to bedside. Pt placed on lucas. ACLS performed at this time, see notes.  Pt intubated by Dr Joni Fears with successful color change, equal chest rise and fall.   Pt transferred to room 26. Pt belongings taken to 44 by this RN.

## 2022-03-30 NOTE — Progress Notes (Signed)
PIV consult: Arrived to ED, RN reports IVTeam no longer needed. Plan is to place central line.

## 2022-03-30 NOTE — Consult Note (Signed)
PHARMACY CONSULT NOTE - FOLLOW UP  Pharmacy Consult for Electrolyte Monitoring and Replacement   Recent Labs: Potassium (mmol/L)  Date Value  03/30/2022 3.4 (L)   Magnesium (mg/dL)  Date Value  01/09/2022 2.6 (H)   Calcium (mg/dL)  Date Value  03/30/2022 9.3   Albumin (g/dL)  Date Value  03/30/2022 4.9  02/05/2021 4.9 (H)   Phosphorus (mg/dL)  Date Value  01/08/2022 2.9   Sodium (mmol/L)  Date Value  03/30/2022 139  02/05/2021 141     Assessment: 41yo F w/ h/o EtOH abuse, TCP, portal hypertension, alcoholic liver cirrhosis w/ ascites presenting for treatment of vomiting, diarrhea, & abdominal cramping to ED developed in-hospital witnessed cardiac arrest with early ROSC transferred to CCU for post cardiac arrest monitoring. Pharmacy consulted for mgmt of electrolytes.  Goal of Therapy:  K>4, Mg>2  Plan:  K 3.4: provider ordered KCL IV 34meq x1. Will additional 41meq q1h x3 runs to target 4 for post-arrest. Will order Mg/Phos level for AM labs. Pt at high refeed risk and post arrest. CTM and replete PRN with AM labs.  Lorna Dibble ,PharmD Clinical Pharmacist 03/30/2022 7:06 PM

## 2022-03-30 NOTE — ED Notes (Signed)
Pt placed on pulse ox at this time.

## 2022-03-30 NOTE — Discharge Instructions (Addendum)
Avoid any medicine that can increase QT interval including Diflucan, Zofran,droperidol, Levaquin.  Ask your doctor to avoid any medicine that can prolong QT interval if you are prescribed the new medicine. Follow-up with PCP in 1 week. Follow-up with cardiology in 1 week.

## 2022-03-30 NOTE — ED Notes (Signed)
1815:Pt pulseless VT on zoll..cardiopulmonary resuscitation started. 1822: Epi given  1824: Pulse palpated, VTach on monitor. 1828: syncronized cardioversion 200J delivered. 1828: 300mg  Amiodarone given BP 100/55 pulse 146 1829: 50mg  Roccuronium given  1831: 7.5 ETT intubation 22cm lip with positive color change 1852: 144/95

## 2022-03-30 NOTE — ED Provider Notes (Signed)
[facility] Provider Note    Event Date/Time   First MD Initiated Contact with Patient 03/30/22 1244     (approximate)   History   Chief Complaint Abdominal Pain (Abd pain and vomiting started last night )   HPI Yvette [name] is a 41 y.o. female, history of alcoholic liver cirrhosis, EtOH abuse, chronic alcohol use, tobacco use, asthma, transaminitis, presents to the emergency department for evaluation of vomiting/diarrhea and abdominal cramping.  Patient states that she has had multiple episodes of vomiting and diarrhea that started last night.  Denies any recent new foods or exposure to sick contacts.  She states that she drinks a 40 ounce beer daily and has not had any changes in her drinking habits lately.  Denies any drug use.  Denies any new medications.  She states that she has not experienced any significant abdominal pain and does not want any imaging at this time, however does want some control of her symptoms.  She presented here on 01/10/2022 recently for similar symptoms.  History Limitations: No limitations.        Physical Exam  Triage Vital Signs: ED Triage Vitals [03/30/22 1233]  Enc Vitals Group     BP (!) 140/104     Pulse Rate 95     Resp 17     Temp 98.1 F (36.7 C)     Temp Source Oral     SpO2 98 %     Weight 121 lb 4.1 oz (55 kg)     Height 5\' 2"  (1.575 m)     Head Circumference      Peak Flow      Pain Score 5     Pain Loc      Pain Edu?      Excl. in [place]?     Most recent vital signs: Vitals:   03/30/22 1233  BP: (!) 140/104  Pulse: 95  Resp: 17  Temp: 98.1 F (36.7 C)  SpO2: 98%    General: Awake, appears uncomfortable. Skin: Warm, dry. No rashes or lesions.  Eyes: PERRL. Conjunctivae normal.  CV: Good peripheral perfusion.  Resp: Normal effort.  Abd: Soft, non-tender. No distention. Neuro: At baseline. No gross neurological deficits.  Musculoskeletal: Normal ROM of all extremities.   Focused Exam:  N/A.  Physical Exam    ED Results / Procedures / Treatments  Labs (all labs ordered are listed, but only abnormal results are displayed) Labs Reviewed  COMPREHENSIVE METABOLIC PANEL - Abnormal; Notable for the following components:      Result Value   Potassium 3.4 (*)    Glucose, Bld 150 (*)    Total Protein 9.1 (*)    AST 229 (*)    ALT 80 (*)    Anion gap 17 (*)    All other components within normal limits  CBC - Abnormal; Notable for the following components:   WBC 3.6 (*)    Hemoglobin 11.8 (*)    RDW 16.4 (*)    Platelets 137 (*)    All other components within normal limits  URINALYSIS, ROUTINE W REFLEX MICROSCOPIC - Abnormal; Notable for the following components:   Color, Urine YELLOW (*)    APPearance HAZY (*)    Glucose, UA 50 (*)    Protein, ur 100 (*)    Leukocytes,Ua LARGE (*)    Bacteria, UA RARE (*)    All other components within normal limits  URINALYSIS, ROUTINE W REFLEX MICROSCOPIC - Abnormal;  Notable for the following components:   Color, Urine AMBER (*)    APPearance CLOUDY (*)    Hgb urine dipstick SMALL (*)    Ketones, ur 20 (*)    Protein, ur 100 (*)    Bacteria, UA FEW (*)    All other components within normal limits  LIPASE, BLOOD  POC URINE PREG, ED     EKG N/A.   RADIOLOGY  ED Provider Interpretation: N/A.  No results found.  PROCEDURES:  Critical Care performed: N/A.  Procedures    MEDICATIONS ORDERED IN ED: Medications  promethazine (PHENERGAN) 12.5 mg in sodium chloride 0.9 % 50 mL IVPB (0 mg Intravenous Stopped 03/30/22 1547)  diphenhydrAMINE (BENADRYL) injection 25 mg (has no administration in time range)  sodium chloride 0.9 % bolus 1,000 mL (1,000 mLs Intravenous New Bag/Given 03/30/22 1325)  metoCLOPramide (REGLAN) injection 10 mg (10 mg Intravenous Given 03/30/22 1324)     IMPRESSION / MDM / ASSESSMENT AND PLAN / ED COURSE  I reviewed the triage vital signs and the nursing notes.                               Differential diagnosis includes, but is not limited to, liver cirrhosis, gastroenteritis, gastritis, alcohol use, pancreatitis, cholelithiasis/cholecystitis  ED Course Patient appears well, vitals within normal limits.  NAD.  Currently afebrile.  CBC shows no leukocytosis.  Anemia present at 11.8, consistent with her previous values.  Lipase unremarkable at 22.  Unlikely pancreatitis.  Urinalysis shows some ketonuria and proteinuria, consistent with dehydration, otherwise no evidence of infection.  CMP shows elevated AST at 229 and elevated ALT at 80, consistent with previous values.  No other significant electrolyte abnormalities or AKI.  Mild anion gap present at 17.  Urine pregnancy negative.  Assessment/Plan Patient presents with nonspecific nausea/vomiting/diarrhea.  There is no abdominal tenderness on exam.  Negative Murphy sign.  She has presented to the ED recently for similar symptoms and has had previous abdominal CTs and right upper quadrant ultrasound performed over the past few months, which have all been negative.  Lab work-up shows some transaminitis, though not significantly elevated from previous levels.  Otherwise reassuring lab work-up.  She appears well clinically.  Unclear etiology at this time, however I have a very low suspicion for any serious or life-threatening pathology.  Suspect likely alcohol gastritis.  She has been treated with metoclopramide, though continues to have persistent nausea.  Currently treating with promethazine at this time.  Plan is to reevaluate after treatment and discharge if her symptoms are controlled.  Patient care transferred over to [facility], PA-C at [phone].  Patient's presentation is most consistent with acute complicated illness / injury requiring diagnostic workup.     FINAL CLINICAL IMPRESSION(S) / ED DIAGNOSES   Final diagnoses:  None     Rx / DC Orders   ED Discharge Orders     None        Note:  This  document was prepared using Dragon voice recognition software and may include unintentional dictation errors.   Teodoro Spray, [place] 03/30/22 1608    Carrie Mew, MD 03/30/22 [phone]

## 2022-03-30 NOTE — H&P (Signed)
NAME:  CORINN STOLTZFUS, MRN:  [id number], DOB:  03-17-1981, LOS: 0 ADMISSION DATE:  03/30/2022, CONSULTATION DATE:  03/30/22 REFERRING MD:  Dr. Cinda Quest, CHIEF COMPLAINT:  Cardiac arrest   History of Present Illness:  41 yo F presenting to [facility] ED from home via EMS on 03/30/22 with complaints of nausea/vomiting since midnight 9/19. History obtained via ED documentation, supplemented by father Nikkia Devoss via phone interview. She reported in triage that she had taken medication without relief and was still vomiting clear liquid. She denied abdominal pain. Her father reported that she had complained of a cold symptoms over the last few days, otherwise was in her normal state of health. She told ED staff that her last drink was Sunday day.  Per chart review the patient has had multiple admissions with gastritis and Transaminitis during which time she develops refractory nausea & vomiting with hypokalemia & hypomagnesemia. She also has a previous history of polymorphic ventricular tachycardia. In June 2023 she reported cutting back on her alcohol consumption to 1-2 16 oz beers daily. ED course: Upon arrival patient afebrile with stable vital signs complaining of nausea and vomiting but no pain.  While being observed in the ED where she was ambulatory the patient refused to wear monitoring equipment requesting nausea medication.  After she received droperidol she subsequently became agonal which was noticed immediately by her nurse bedside.  Patient respiratory arrested requiring bag-valve-mask support.  No palpable pulse and CPR was started with the first recognized rhythm as V-fib.  The patient was defibrillated with ACLS medications per protocol provided.  Next pulse check showed V. tach with a palpable pulse heart rate 200 and BP 100/50.  300 mg amiodarone was given and she was synchronized cardioverted to sinus tach.  The patient was then emergently intubated and placed on mechanical ventilatory support.   Medications given: droperidol, benadryl, IV contrast, ativan 2 mg, reglan 10 mg, 2 L NS bolus, fentanyl drip initiated Initial Vitals: afebrile 98.6, tachypneic- 20, NSR- 90, 134/76 & 97% on RA Significant labs: (Labs/ Imaging personally reviewed) I, Domingo Pulse [place], AGACNP-BC, personally viewed and interpreted this ECG. EKG Interpretation: Date: 03/30/22, EKG Time: 16:38, Rate: 61, Rhythm: NSR, QRS Axis:  LAD, Intervals: Qtc prolongation, ST/T Wave abnormalities: non specific T wave inversions, Narrative Interpretation: NSR with prolonged Qtc Chemistry: Na+: 139, K+: 3.5, BUN/Cr.: 8/0.81, Serum CO2/ AG: 22/17, Mg: 1.3, AST/ALT: 229/80 Hematology: WBC: 3.6, Hgb: 11.8, plt: 137 Troponin: 5 > 24, BNP: 11.2, Lactic/ PCT: 1.9/ <0.10, COVID-19: positive  ABG: 7.14/ 55/ 122/ 18.7 CXR 03/30/22: there are patchy densities in the LUL & LLL suggesting atelectasis vs pneumonia CT head wo contrast 03/30/22: no acute intracranial abnormality. Mild cerebral volume loss, advanced for age. Mild paranasal sinus disease. CT angio chest PE w or wo contrast 03/30/22: no evidence of PE, no acute vascular abnormality. Bibasilar dependent atelectasis. Irregularity of the sternum as well as multiple bilateral broken ribs. CT abdomen/pelvis w contrast 03/30/22: no acute process. Gallbladder distended with bile.  PCCM consulted for admission due to acute respiratory arrest leading to cardiac arrest and mechanical intubation requiring ventilatory support.  Pertinent  Medical History  Alcoholic cirrhosis EtOH abuse Alcohol related gastritis Asthma Kawasaki's disease NSVT Anemia Tobacco abuse Transaminitis Hypokalemia Hypomagnesemia  Significant Hospital Events: Including procedures, antibiotic start and stop dates in addition to other pertinent events   03/30/22: Admit to ICU post acute respiratory arrest leading to cardiac arrest and mechanical intubation requiring ventilatory support. COVID +  Interim  History / Subjective:  Patient intubated and sedated, unable to follow commands  Objective   Blood pressure 122/86, pulse (!) 146, temperature 98.6 F (37 C), temperature source Axillary, resp. rate (!) 25, height 5' 2.01" (1.575 m), weight 55 kg, SpO2 100 %.    Vent Mode: AC FiO2 (%):  [40 %] 40 % Set Rate:  [15 bmp] 15 bmp Vt Set:  [450 mL] 450 mL PEEP:  [5 cmH20] 5 cmH20   Intake/Output Summary (Last 24 hours) at 03/30/2022 1949 Last data filed at 03/30/2022 1806 Gross per 24 hour  Intake 1000 ml  Output --  Net 1000 ml   Filed Weights   03/30/22 1233  Weight: 55 kg    Examination: General: Adult female, critically ill, lying in bed intubated & sedated requiring mechanical ventilation, NAD HEENT: MM pink/moist, anicteric, atraumatic, neck supple Neuro: RASS-5, intubated & sedated, unable to follow commands, PERRL +3 CV: s1s2 RRR, NSR on monitor, no r/m/g Pulm: Regular, non labored on AC 40% & PEEP 5, breath sounds clear/diminished-BUL & diminished-BLL GI: soft, rounded, bs x 4 GU: foley in place with clear yellow urine Skin:  no rashes/lesions noted Extremities: warm/dry, pulses + 2 R/P, no edema noted  Resolved Hospital Problem list     Assessment & Plan:  Cardiac arrest: initial rhythm V-fib suspect secondary to R-on-T PVC in the setting of prolonged Qtc & severe hypomagnesemia  Severe Hypomagnesemia Hypokalemia 9 minutes downtime, received 1 defibrillation & 1 synchronized cardioversion  UDS: negative - candidate for Normothermia Protocol, currently afebrile - Continue vasopressors: levophed PRN, to maintain MAP > 65 - Echocardiogram ordered - Trend troponin, lactic - Cardiology consulted, appreciate input - aggressive Mg & K+ supplementation - f/u EKG - avoid QTc prolonging medications  Acute hypoxic respiratory failure  - Ventilator settings: PRVC 8 mL/kg, 40% FiO2, 5 PEEP - Wean PEEP and FiO2 for sats greater than 90% - Plateau pressures less than 30 cm  H20 - VAP bundle in place - Intermittent chest x-ray & ABG - Daily WUA/ SBT as tolerated - Ensure adequate pulmonary hygiene  - F/u cultures, trend PCT - Continue Aspiration Pna coverage: Unasyn - methylprednisolone 0.5 mg/kg x 3-5 days, followed by a taper - Follow inflammatory markers: Ferritin, D-dimer, CRP, LDH - start molnupiravir - continue Vitamin C & zinc  Leukopenia Query Aspiration  - daily CBC, monitor WBC & fever curve - trend lactic & PCT - Unasyn as above - follow cultures  Transaminitis in the setting of Alcoholic Cirrhosis Gastritis - Trend hepatic function - avoid hepatotoxic agents - continue outpatient PPI BID  At risk for anoxic encephalopathy ETOH abuse at risk for withdrawal symptoms 9 minutes downtime, Initial head CT: negative, will see how patient responds once intubation medications have worn off - PAD protocol in place: midazolam IVP & fentanyl drip - RASS goal: -1 - f/u EEG  - repeat CT head PRN - CIWA Q 4, as able - multivitamin, folic acid, thiamine daily - librium detox protocol   Best Practice (right click and "Reselect all SmartList Selections" daily)  Diet/type: NPO w/ meds via tube DVT prophylaxis: LMWH GI prophylaxis: PPI Lines: Central line and yes and it is still needed Foley:  Yes, and it is still needed Code Status:  full code Last date of multidisciplinary goals of care discussion [N/A]  Labs   CBC: Recent Labs  Lab 03/30/22 1242  WBC 3.6*  HGB 11.8*  HCT 36.6  MCV 80.8  PLT 137*  Basic Metabolic Panel: Recent Labs  Lab 03/30/22 1242  NA 139  K 3.4*  CL 100  CO2 22  GLUCOSE 150*  BUN 8  CREATININE 0.81  CALCIUM 9.3   GFR: Estimated Creatinine Clearance: 74.5 mL/min (by C-G formula based on SCr of 0.81 mg/dL). Recent Labs  Lab 03/30/22 1242  WBC 3.6*    Liver Function Tests: Recent Labs  Lab 03/30/22 1242  AST 229*  ALT 80*  ALKPHOS 109  BILITOT 0.8  PROT 9.1*  ALBUMIN 4.9   Recent Labs   Lab 03/30/22 1242  LIPASE 22   No results for input(s): "AMMONIA" in the last 168 hours.  ABG    Component Value Date/Time   PHART 7.14 (LL) 03/30/2022 1847   PCO2ART 55 (H) 03/30/2022 1847   PO2ART 122 (H) 03/30/2022 1847   HCO3 18.7 (L) 03/30/2022 1847   ACIDBASEDEF 10.7 (H) 03/30/2022 1847   O2SAT 97.5 03/30/2022 1847     Coagulation Profile: No results for input(s): "INR", "PROTIME" in the last 168 hours.  Cardiac Enzymes: No results for input(s): "CKTOTAL", "CKMB", "CKMBINDEX", "TROPONINI" in the last 168 hours.  HbA1C: No results found for: "HGBA1C"  CBG: No results for input(s): "GLUCAP" in the last 168 hours.  Review of Systems:   UTA- patient unable to participate in interview at this time  Past Medical History:  She,  has a past medical history of Asthma, ETOH abuse, H/O Kawasaki's disease, History of anemia, History of blood transfusion (1984), Hypokalemia, Hypomagnesemia, Leukopenia, Neutropenia ([place]), NSVT (nonsustained ventricular tachycardia) ([place]), Tobacco abuse, and Transaminitis.   Surgical History:   Past Surgical History:  Procedure Laterality Date   ESOPHAGOGASTRODUODENOSCOPY N/A 10/29/2020   Procedure: ESOPHAGOGASTRODUODENOSCOPY (EGD);  Surgeon: Jonathon Bellows, MD;  Location: [facility] ENDOSCOPY;  Service: Gastroenterology;  [name];   ESOPHAGOGASTRODUODENOSCOPY (EGD) WITH PROPOFOL N/A 03/14/2020   Procedure: ESOPHAGOGASTRODUODENOSCOPY (EGD) WITH PROPOFOL;  Surgeon: Virgel Manifold, MD;  [name];  Service: Endoscopy;  [name];   ESOPHAGOGASTRODUODENOSCOPY (EGD) WITH PROPOFOL N/A 02/25/2021   Procedure: ESOPHAGOGASTRODUODENOSCOPY (EGD) WITH PROPOFOL;  Surgeon: Virgel Manifold, MD;  [name];  Service: Endoscopy;  [name];     Social History:   reports that she has been smoking cigarettes. She has a 2.50 pack-year smoking history. She has never used smokeless tobacco. She reports current  alcohol use of about 61.0 standard drinks of alcohol per week. She reports that she does not use drugs.   Family History:  Her family history includes Cancer in her paternal aunt and paternal aunt; Lupus in her mother; Prostate cancer in her father.   Allergies No Known Allergies   Home Medications  Prior to Admission medications   Medication Sig Start Date End Date Taking? Authorizing Provider  DEPO-PROVERA 150 MG/ML injection Inject 150 mg into the muscle every 3 (three) months. 03/22/22  Yes [provider]  DIFLUCAN 150 MG tablet Take by mouth. 03/22/22  Yes [provider]  mirtazapine (REMERON) 15 MG tablet Take 15 mg by mouth at bedtime. 08/12/21  Yes [provider]  pantoprazole (PROTONIX) 40 MG tablet Take 40 mg by mouth daily. 08/12/21  Yes [provider]  naltrexone (DEPADE) 50 MG tablet Take 50 mg by mouth daily. 03/22/22   [provider]  ondansetron (ZOFRAN-ODT) 8 MG disintegrating tablet Take 1 tablet (8 mg total) by mouth every 8 (eight) hours as needed for nausea or vomiting. Patient not taking: Reported on 03/30/2022 01/10/22   Valora Piccolo  K, MD  promethazine (PHENERGAN) 25 MG suppository Place 1 suppository (25 mg total) rectally every 6 (six) hours as needed for nausea or vomiting. Patient not taking: Reported on 03/30/2022 01/10/22   Nance Pear, MD     Critical care time: 1 minutes       Venetia Night, AGACNP-BC Acute Care Nurse Practitioner [facility]   [phone] / [phone] Please see Amion for pager details.

## 2022-03-30 NOTE — ED Triage Notes (Signed)
Abd pain vomiting started last night

## 2022-03-30 NOTE — ED Notes (Addendum)
This RN at bedside to medicate per [facility]. Informed pt of medications and the need to put her on cardiac monitoring., pt agreeable with this. Pt endorsing she "feels weak" and "feels dehydrated". Pt requesting gingerale. Will request from provider.

## 2022-03-31 ENCOUNTER — Inpatient Hospital Stay (HOSPITAL_COMMUNITY)
Admit: 2022-03-31 | Discharge: 2022-03-31 | Disposition: A | Discharge status: Home / Self Care | Payer: Medicaid Other | Attending: Internal Medicine | Admitting: Internal Medicine

## 2022-03-31 DIAGNOSIS — U071 COVID-19: Secondary | ICD-10-CM

## 2022-03-31 DIAGNOSIS — R0603 Acute respiratory distress: Secondary | ICD-10-CM

## 2022-03-31 DIAGNOSIS — F1910 Other psychoactive substance abuse, uncomplicated: Secondary | ICD-10-CM

## 2022-03-31 DIAGNOSIS — I469 Cardiac arrest, cause unspecified: Secondary | ICD-10-CM | POA: Diagnosis not present

## 2022-03-31 DIAGNOSIS — I4901 Ventricular fibrillation: Secondary | ICD-10-CM | POA: Diagnosis not present

## 2022-03-31 LAB — BLOOD GAS, ARTERIAL
Acid-Base Excess: 1.1 mmol/L (ref 0.0–2.0)
Bicarbonate: 22.9 mmol/L (ref 20.0–28.0)
FIO2: 40 %
MECHVT: 430 mL
O2 Saturation: 98 %
PEEP: 5 cmH2O
Patient temperature: 37
RATE: 18 resp/min
pCO2 arterial: 28 mmHg — ABNORMAL LOW (ref 32–48)
pH, Arterial: 7.52 — ABNORMAL HIGH (ref 7.35–7.45)
pO2, Arterial: 99 mmHg (ref 83–108)

## 2022-03-31 LAB — PROCALCITONIN: Procalcitonin: <0.1 ng/mL

## 2022-03-31 LAB — BASIC METABOLIC PANEL
Anion gap: 10 (ref 5–15)
BUN: 7 mg/dL (ref 6–20)
CO2: 24 mmol/L (ref 22–32)
Calcium: 7.6 mg/dL — ABNORMAL LOW (ref 8.9–10.3)
Chloride: 108 mmol/L (ref 98–111)
Creatinine, Ser: 0.85 mg/dL (ref 0.44–1.00)
GFR, Estimated: >60 mL/min (ref >60)
Glucose, Bld: 121 mg/dL — ABNORMAL HIGH (ref 70–99)
Potassium: 3.3 mmol/L — ABNORMAL LOW (ref 3.5–5.1)
Sodium: 142 mmol/L (ref 135–145)

## 2022-03-31 LAB — GLUCOSE, CAPILLARY
Glucose-Capillary: 112 mg/dL — ABNORMAL HIGH (ref 70–99)
Glucose-Capillary: 123 mg/dL — ABNORMAL HIGH (ref 70–99)
Glucose-Capillary: 138 mg/dL — ABNORMAL HIGH (ref 70–99)
Glucose-Capillary: 146 mg/dL — ABNORMAL HIGH (ref 70–99)
Glucose-Capillary: 179 mg/dL — ABNORMAL HIGH (ref 70–99)
Glucose-Capillary: 205 mg/dL — ABNORMAL HIGH (ref 70–99)
Glucose-Capillary: 80 mg/dL (ref 70–99)
Glucose-Capillary: 86 mg/dL (ref 70–99)

## 2022-03-31 LAB — ECHOCARDIOGRAM COMPLETE
Height: 62 in
S' Lateral: 3 cm
Weight: 1806.01 oz

## 2022-03-31 LAB — TRIGLYCERIDES: Triglycerides: 89 mg/dL (ref <150)

## 2022-03-31 LAB — TROPONIN I (HIGH SENSITIVITY)
Troponin I (High Sensitivity): 101 ng/L (ref <18)
Troponin I (High Sensitivity): 90 ng/L — ABNORMAL HIGH (ref <18)

## 2022-03-31 LAB — HEMOGLOBIN A1C
Hgb A1c MFr Bld: 12.5 % — ABNORMAL HIGH (ref 4.8–5.6)
Mean Plasma Glucose: 312.05 mg/dL

## 2022-03-31 LAB — MAGNESIUM
Magnesium: 4.7 mg/dL — ABNORMAL HIGH (ref 1.7–2.4)
Magnesium: 2.5 mg/dL — ABNORMAL HIGH (ref 1.7–2.4)

## 2022-03-31 LAB — FERRITIN: Ferritin: 35 ng/mL (ref 11–307)

## 2022-03-31 LAB — C-REACTIVE PROTEIN: CRP: 0.7 mg/dL (ref <1.0)

## 2022-03-31 LAB — D-DIMER, QUANTITATIVE: D-Dimer, Quant: 3.64 ug/mL-FEU — ABNORMAL HIGH (ref 0.00–0.50)

## 2022-03-31 LAB — PHOSPHORUS
Phosphorus: 1.4 mg/dL — ABNORMAL LOW (ref 2.5–4.6)
Phosphorus: 5 mg/dL — ABNORMAL HIGH (ref 2.5–4.6)

## 2022-03-31 LAB — POTASSIUM: Potassium: 3.6 mmol/L (ref 3.5–5.1)

## 2022-03-31 LAB — LACTIC ACID, PLASMA: Lactic Acid, Venous: 1.7 mmol/L (ref 0.5–1.9)

## 2022-03-31 MED ORDER — CHLORDIAZEPOXIDE HCL 25 MG PO CAPS: 25.0000 mg | ORAL_CAPSULE | Freq: Three times a day (TID) | ORAL | Status: DC

## 2022-03-31 MED ORDER — ENOXAPARIN SODIUM 40 MG/0.4ML IJ SOSY
40.0000 mg | PREFILLED_SYRINGE | INTRAMUSCULAR | Status: DC
  Administered 2022-03-31 – 2022-04-06 (×5): 40 mg via SUBCUTANEOUS
  Filled 2022-03-31 (×6): qty 0.4

## 2022-03-31 MED ORDER — CHLORDIAZEPOXIDE HCL 25 MG PO CAPS: 25.0000 mg | ORAL_CAPSULE | ORAL | Status: DC

## 2022-03-31 MED ORDER — VITAMIN C 500 MG PO TABS
500.0000 mg | ORAL_TABLET | Freq: Every day | ORAL | Status: DC
  Administered 2022-04-01 – 2022-04-06 (×4): 500 mg via ORAL
  Filled 2022-03-31 (×5): qty 1

## 2022-03-31 MED ORDER — CHLORDIAZEPOXIDE HCL 25 MG PO CAPS
25.0000 mg | ORAL_CAPSULE | ORAL | Status: DC
  Filled 2022-03-31: qty 1

## 2022-03-31 MED ORDER — ACETAMINOPHEN 325 MG PO TABS: 650.0000 mg | ORAL_TABLET | ORAL | Status: DC | PRN

## 2022-03-31 MED ORDER — CHLORDIAZEPOXIDE HCL 25 MG PO CAPS
25.0000 mg | ORAL_CAPSULE | Freq: Three times a day (TID) | ORAL | Status: AC
  Administered 2022-04-01 (×3): 25 mg via ORAL
  Filled 2022-03-31 (×3): qty 1

## 2022-03-31 MED ORDER — DEXMEDETOMIDINE HCL IN NACL 400 MCG/100ML IV SOLN
0.4000 ug/kg/h | INTRAVENOUS | Status: DC
  Filled 2022-03-31: qty 100

## 2022-03-31 MED ORDER — CHLORDIAZEPOXIDE HCL 25 MG PO CAPS
25.0000 mg | ORAL_CAPSULE | Freq: Four times a day (QID) | ORAL | Status: AC
  Administered 2022-03-31 (×3): 25 mg via ORAL
  Filled 2022-03-31 (×3): qty 1

## 2022-03-31 MED ORDER — THIAMINE MONONITRATE 100 MG PO TABS
100.0000 mg | ORAL_TABLET | Freq: Every day | ORAL | Status: DC
  Administered 2022-04-01: 100 mg via ORAL
  Filled 2022-03-31 (×2): qty 1

## 2022-03-31 MED ORDER — CHLORDIAZEPOXIDE HCL 25 MG PO CAPS: 25.0000 mg | ORAL_CAPSULE | Freq: Every day | ORAL | Status: DC

## 2022-03-31 MED ORDER — ONDANSETRON 4 MG PO TBDP
4.0000 mg | ORAL_TABLET | Freq: Three times a day (TID) | ORAL | Status: DC | PRN
  Filled 2022-03-31: qty 1

## 2022-03-31 MED ORDER — ENSURE ENLIVE PO LIQD
237.0000 mL | Freq: Three times a day (TID) | ORAL | Status: DC
  Administered 2022-03-31 – 2022-04-01 (×5): 237 mL via ORAL

## 2022-03-31 MED ORDER — PANTOPRAZOLE 2 MG/ML SUSPENSION: 40.0000 mg | Freq: Two times a day (BID) | ORAL | Status: DC

## 2022-03-31 MED ORDER — POTASSIUM PHOSPHATES 15 MMOLE/5ML IV SOLN
45.0000 mmol | Freq: Once | INTRAVENOUS | Status: AC
  Administered 2022-03-31: 45 mmol via INTRAVENOUS
  Filled 2022-03-31: qty 15

## 2022-03-31 MED ORDER — CHLORDIAZEPOXIDE HCL 25 MG PO CAPS
25.0000 mg | ORAL_CAPSULE | Freq: Four times a day (QID) | ORAL | Status: DC
  Administered 2022-03-31: 25 mg
  Filled 2022-03-31: qty 1

## 2022-03-31 MED ORDER — POLYETHYLENE GLYCOL 3350 17 G PO PACK: 17.0000 g | PACK | Freq: Every day | ORAL | Status: DC | PRN

## 2022-03-31 MED ORDER — MOLNUPIRAVIR EUA 200MG CAPSULE
4.0000 | ORAL_CAPSULE | Freq: Two times a day (BID) | ORAL | Status: AC
  Administered 2022-03-31 – 2022-04-04 (×5): 800 mg via ORAL
  Filled 2022-03-31: qty 4

## 2022-03-31 MED ORDER — NICOTINE 14 MG/24HR TD PT24
14.0000 mg | MEDICATED_PATCH | Freq: Every day | TRANSDERMAL | Status: DC
  Administered 2022-03-31 – 2022-04-04 (×3): 14 mg via TRANSDERMAL
  Filled 2022-03-31 (×6): qty 1

## 2022-03-31 MED ORDER — POTASSIUM CHLORIDE CRYS ER 20 MEQ PO TBCR
40.0000 meq | EXTENDED_RELEASE_TABLET | Freq: Once | ORAL | Status: AC
  Administered 2022-03-31: 40 meq via ORAL
  Filled 2022-03-31: qty 2

## 2022-03-31 MED ORDER — ZINC SULFATE 220 (50 ZN) MG PO CAPS
220.0000 mg | ORAL_CAPSULE | Freq: Every day | ORAL | Status: DC
  Administered 2022-04-01 – 2022-04-06 (×4): 220 mg via ORAL
  Filled 2022-03-31 (×5): qty 1

## 2022-03-31 MED ORDER — DOCUSATE SODIUM 100 MG PO CAPS: 100.0000 mg | ORAL_CAPSULE | Freq: Two times a day (BID) | ORAL | Status: DC | PRN

## 2022-03-31 MED ORDER — ADULT MULTIVITAMIN W/MINERALS CH
1.0000 | ORAL_TABLET | Freq: Every day | ORAL | Status: DC
  Administered 2022-04-01 – 2022-04-06 (×4): 1 via ORAL
  Filled 2022-03-31 (×5): qty 1

## 2022-03-31 MED ORDER — ORAL CARE MOUTH RINSE: 15.0000 mL | OROMUCOSAL | Status: DC | PRN

## 2022-03-31 MED ORDER — FOLIC ACID 1 MG PO TABS
1.0000 mg | ORAL_TABLET | Freq: Every day | ORAL | Status: DC
  Administered 2022-04-01 – 2022-04-06 (×4): 1 mg via ORAL
  Filled 2022-03-31 (×5): qty 1

## 2022-03-31 NOTE — Consult Note (Signed)
Cardiology Consultation   Patient ID: YASHEIKA CANCHOLA MRN: [id number]; DOB: 01/26/1981  Admit date: 03/30/2022 Date of Consult: 03/31/2022  PCP:  [facility] [place] [facility] Cardiologist:  Kathlyn Sacramento, MD        Patient Profile:   ARALIA CHARON is a 41 y.o. 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 Mortimer Fries.  History of Present Illness:   Ms. Spinale is currently intubated.  History is obtained from her chart.  She presented to the [facility] emergency department yesterday evening complaining of a 1 day history of nausea/vomiting.  She had taken antiemetics at home without relief.  Her father reported that Ms. Gauna had been complaining of URI symptoms for the last few days.  She has a history of alcohol abuse and the last drink Sunday (3 days ago).  She has been admitted multiple times with nausea and vomiting complicated by hypokalemia and hypomagnesemia along with gastritis and transaminitis.  She has had polymorphic ventricular tachycardia in the setting of electrolyte derangements in the past.  In the ED last night, she continued to complain of nausea and vomiting.  She refused to wear cardiac monitoring equipment.  After receiving droperidol for nausea, she was noted to be unresponsive with agonal breathing.  CPR was initiated with initial rhythm reportedly showing ventricular fibrillation.  She received approximately 10 minutes of CPR and 2 defibrillations before ROSC.  She was intubated and remains on the ventilator in the ICU.  She was found to be COVID-positive as well.   Past Medical History:  Diagnosis Date   Asthma    ETOH abuse    H/O Kawasaki's disease    as 60 month old child and at 41 years   History of anemia     History of blood transfusion 1984   Hypokalemia    Hypomagnesemia    Leukopenia    Neutropenia (HCC)    NSVT (nonsustained ventricular tachycardia) ([place])    a. Noted during admission 06/2021 assoc w/ palpitations and presyncope.   Tobacco abuse    Transaminitis     Past Surgical History:  Procedure Laterality Date   ESOPHAGOGASTRODUODENOSCOPY N/A 10/29/2020   Procedure: ESOPHAGOGASTRODUODENOSCOPY (EGD);  Surgeon: Jonathon Bellows, MD;  Location: [facility] ENDOSCOPY;  Service: Gastroenterology;  [name];   ESOPHAGOGASTRODUODENOSCOPY (EGD) WITH PROPOFOL N/A 03/14/2020   Procedure: ESOPHAGOGASTRODUODENOSCOPY (EGD) WITH PROPOFOL;  Surgeon: Virgel Manifold, MD;  [name];  Service: Endoscopy;  [name];   ESOPHAGOGASTRODUODENOSCOPY (EGD) WITH PROPOFOL N/A 02/25/2021   Procedure: ESOPHAGOGASTRODUODENOSCOPY (EGD) WITH PROPOFOL;  Surgeon: Virgel Manifold, MD;  [name];  Service: Endoscopy;  [name];     Home Medications:  Prior to Admission medications   Medication Sig Start Date Alyana Kreiter Date Taking? Authorizing Provider  DEPO-PROVERA 150 MG/ML injection Inject 150 mg into the muscle every 3 (three) months. 03/22/22  Yes [provider]  DIFLUCAN 150 MG tablet Take by mouth. 03/22/22  Yes [provider]  mirtazapine (REMERON) 15 MG tablet Take 15 mg by mouth at bedtime. 08/12/21  Yes [provider]  pantoprazole (PROTONIX) 40 MG tablet Take 40 mg by mouth daily. 08/12/21  Yes [provider]  naltrexone (DEPADE) 50 MG tablet Take 50 mg by mouth daily. 03/22/22   [provider]  ondansetron (ZOFRAN-ODT) 8 MG disintegrating tablet Take 1 tablet (8 mg total) by mouth every 8 (eight) hours as needed for nausea or vomiting. Patient not taking: Reported on 03/30/2022 01/10/22   Naaman Plummer, MD  promethazine (PHENERGAN) 25 MG suppository Place 1 suppository (25 mg total) rectally every 6 (six) hours as needed  for nausea or vomiting. Patient not taking: Reported on 03/30/2022 01/10/22   Nance Pear, MD    Inpatient Medications: Scheduled Meds:  vitamin C  500 mg Per Tube Daily   chlordiazePOXIDE  25 mg Per Tube QID   Followed by   Derrill Memo ON 04/01/2022] chlordiazePOXIDE  25 mg Per Tube TID   Followed by   Derrill Memo ON 04/02/2022] chlordiazePOXIDE  25 mg Per Tube BH-qamhs   Followed by   Derrill Memo ON 04/03/2022] chlordiazePOXIDE  25 mg Per Tube Daily   Chlorhexidine Gluconate Cloth  6 each Topical Q0600   docusate  100 mg Per Tube BID   enoxaparin (LOVENOX) injection  40 mg Subcutaneous [id number]   folic acid  1 mg Per Tube Daily   insulin aspart  0-15 Units Subcutaneous Q4H   methylPREDNISolone (SOLU-MEDROL) injection  0.5 mg/kg Intravenous Q12H   Followed by   Derrill Memo ON 04/03/2022] predniSONE  50 mg Per Tube Q breakfast   molnupiravir EUA  4 capsule Oral BID   multivitamin with minerals  1 tablet Per Tube Daily   mouth rinse  15 mL Mouth Rinse Q2H   pantoprazole  40 mg Per Tube BID   polyethylene glycol  17 g Per Tube Daily   sodium chloride flush  3 mL Intravenous Q12H   thiamine  100 mg Per Tube Daily   zinc sulfate  220 mg Per Tube Daily   Continuous Infusions:  sodium chloride     ampicillin-sulbactam (UNASYN) IV 3 g (03/31/22 0507)   dexmedetomidine (PRECEDEX) IV infusion     fentaNYL infusion INTRAVENOUS 200 mcg/hr (03/31/22 0657)   norepinephrine (LEVOPHED) Adult infusion     potassium chloride     potassium PHOSPHATE IVPB (in mmol) 45 mmol (03/31/22 0413)   propofol (DIPRIVAN) infusion     PRN Meds: sodium chloride, acetaminophen, docusate, midazolam, midazolam, mouth rinse, polyethylene glycol, sodium chloride flush  Allergies:   No Known Allergies  Social History:   Social History   Tobacco Use   Smoking status: Every Day    Packs/day: 0.25    Years: 10.00    Total pack years: 2.50    Types: Cigarettes   Smokeless tobacco: Never  Vaping Use   Vaping Use: Never used   Substance Use Topics   Alcohol use: Yes    Alcohol/week: 61.0 standard drinks of alcohol    Types: 21 Cans of beer, [name]   Drug use: No      Family History:   Family History  Problem Relation Age of Onset   Lupus Mother    Prostate cancer Father    Cancer Paternal Aunt        Breast    Cancer Paternal Aunt        Breast      ROS:  Unable to obtain as the patient is intubated and sedated.  Physical Exam/Data:   Vitals:   03/31/22 0400 03/31/22 0500 03/31/22 0600 03/31/22 0747  BP: 102/84 103/88 101/84   Pulse: 83 86 83   Resp: 18 19 15    Temp: 97.8 F (36.6  C)     TempSrc: Oral     SpO2: 100% 100% 100% 100%  Weight:  51.2 kg    Height:        Intake/Output Summary (Last 24 hours) at 03/31/2022 0832 Last data filed at 03/31/2022 0600 Gross per 24 hour  Intake 1486.68 ml  Output 875 ml  Net 611.68 ml      03/31/2022    5:00 AM 03/30/2022    9:15 PM 03/30/2022   12:33 PM  Last 3 Weights  Weight (lbs) 112 lb 14 oz 112 lb 14 oz 121 lb 4.1 oz  Weight (kg) 51.2 kg 51.2 kg 55 kg     Body mass index is 20.65 kg/m.  General: Intubated and awake but not routinely following commands. HEENT: normal Neck: no JVD Vascular: No carotid bruits; Distal pulses 2+ bilaterally Cardiac: Regular rate and rhythm with 1/6 systolic murmur. Lungs:  clear anteriorly. Abd: soft, nontender, no hepatomegaly  Ext: no edema Musculoskeletal:  No deformities, BUE and BLE strength normal and equal Skin: warm and dry  Neuro: Awake but not following commands, remaining on sedation while intubated. Psych: Unable to assess as the patient is intubated/sedated.  EKG: Initial EKG from 4:38 PM yesterday shows normal sinus rhythm with PACs and mild QT prolongation (QTc 495).  Most recent tracing from 7:23 PM shows sinus tachycardia with decreased QTc, though somewhat difficult to interpret in the setting of tachycardia. Telemetry:  Telemetry  was personally reviewed and demonstrates: Normal sinus rhythm.  Initial ED telemetry yesterday (presumably post ROSC) shows sinus tachycardia.  QTc appears prolonged this morning on telemetry (approximately 510 ms).  Relevant CV Studies: TTE (06/30/2021):  1. Left ventricular ejection fraction, by estimation, is 50 to 55%. The  left ventricle has low normal function. Left ventricular endocardial  border not optimally defined to evaluate regional wall motion. Left  ventricular diastolic parameters are  indeterminate.   2. Accurate EF evaluation and wall motion assesment is limited by  frequent PVCs.   3. Right ventricular systolic function is normal. The right ventricular  size is normal. Tricuspid regurgitation signal is inadequate for assessing  PA pressure.   4. The mitral valve is normal in structure. No evidence of mitral valve  regurgitation. No evidence of mitral stenosis.   5. The aortic valve is normal in structure. Aortic valve regurgitation is  not visualized. No aortic stenosis is present.   6. The inferior vena cava is normal in size with greater than 50%  respiratory variability, suggesting right atrial pressure of 3 mmHg.   GXT (10/01/2021): Negative treadmill stress test with decreased exercise capacity for age and exaggerated heart rate response to exercise.  Laboratory Data:  High Sensitivity Troponin:   Recent Labs  Lab 03/30/22 1242 03/30/22 2021 03/30/22 2154 03/31/22 0209 03/31/22 0608  TROPONINIHS 5 24* 62* 101* 90*     Chemistry Recent Labs  Lab 03/30/22 1242 03/30/22 1246 03/30/22 2154 03/31/22 0209  NA 139  --  140 142  K 3.4*  --  3.4* 3.3*  CL 100  --  106 108  CO2 22  --  19* 24  GLUCOSE 150*  --  170* 121*  BUN 8  --  7 7  CREATININE 0.81  --  0.87 0.85  CALCIUM 9.3  --  7.3* 7.6*  MG  --  1.3* 1.2* 4.7*  GFRNONAA >60  --  >60 >60  ANIONGAP 17*  --  15 10    Recent Labs  Lab 03/30/22 1242 03/30/22 2154  PROT 9.1* 6.7  ALBUMIN 4.9  3.4*  AST 229* 424*  ALT 80* 96*  ALKPHOS 109 80  BILITOT 0.8 0.8   Lipids  Recent Labs  Lab 03/31/22 0209  TRIG 89    Hematology Recent Labs  Lab 03/30/22 1242 03/30/22 2154  WBC 3.6* 6.1  RBC 4.53 3.67*  HGB 11.8* 9.8*  HCT 36.6 30.1*  MCV 80.8 82.0  MCH 26.0 26.7  MCHC 32.2 32.6  RDW 16.4* 16.4*  PLT 137* 105*   Thyroid No results for input(s): "TSH", "FREET4" in the last 168 hours.  BNP Recent Labs  Lab 03/30/22 1246  BNP 11.2    DDimer  Recent Labs  Lab 03/31/22 0209  DDIMER 3.64*     Radiology/Studies:  CT Angio Chest Pulmonary Embolism (PE) W or WO Contrast  Result Date: 03/30/2022 CLINICAL DATA:  Pulmonary embolism suspected. EXAM: CT ANGIOGRAPHY CHEST AND ABDOMEN TECHNIQUE: Multidetector CT imaging of the chest and abdomen was performed using the standard protocol during bolus administration of intravenous contrast. Multiplanar CT image reconstructions and MIPs were obtained to evaluate the vascular anatomy. RADIATION DOSE REDUCTION: This exam was performed according to the departmental dose-optimization program which includes automated exposure control, adjustment of the mA and/or kV according to patient size and/or use of iterative reconstruction technique. CONTRAST:  52mL OMNIPAQUE IOHEXOL 350 MG/ML SOLN COMPARISON:  None Available. FINDINGS: CTA CHEST FINDINGS Cardiovascular: Satisfactory opacification of the pulmonary arteries to the segmental level. No evidence of pulmonary embolism. Normal heart size. No pericardial effusion. Mediastinum/Nodes: No enlarged mediastinal, hilar, or axillary lymph nodes. Thyroid is unremarkable. Endotracheal tube with distal tip approximately 1.3 cm above the carina. Lungs/Pleura: Bibasilar dependent atelectasis. No focal consolidation or pleural effusion. Musculoskeletal: Mild irregularity of the sternal body and manubrium as well as cortical irregularity of bilateral ribs, it may be artifact due to significant respiratory  motion or mildly displaced fractures in this patient with CPR. Review of the MIP images confirms the above findings. CTA ABDOMEN FINDINGS Aorta: Normal caliber aorta without aneurysm, dissection, vasculitis or significant stenosis. Celiac: Patent without evidence of aneurysm, dissection, vasculitis or significant stenosis. SMA: Patent without evidence of aneurysm, dissection, vasculitis or significant stenosis. Renals: Both renal arteries are patent without evidence of aneurysm, dissection, vasculitis, fibromuscular dysplasia or significant stenosis. IMA: Patent without evidence of aneurysm, dissection, vasculitis or significant stenosis. Inflow: Patent without evidence of aneurysm, dissection, vasculitis or significant stenosis. Veins: No obvious venous abnormality within the limitations of this arterial phase study. Right femoral access catheter with distal tip in the internal iliac vein. Hepatobiliary: No acute hepatic abnormality. No biliary ductal dilatation. Gallbladder is distended without pericholecystic inflammatory changes. Pancreas: Pancreas is unremarkable. No pancreatic ductal dilatation. Spleen: Normal in size without evidence of acute trauma. Adrenals/Urinary Tract: Adrenals are unremarkable. Symmetric perfusion of bilateral kidneys. No evidence of nephrolithiasis or hydronephrosis. No ureteral calculus. Urinary bladder is distended with Foley's catheter in place and small amount of air, likely iatrogenic. Stomach/Bowel: Stomach is unremarkable. Bowel loops are normal in caliber. No evidence of colitis or diverticulitis. Lymphatic: No lymphadenopathy. Musculoskeletal: No acute osseous abnormality. Other: None Review of the MIP images confirms the above findings. IMPRESSION: CT angio chest: 1. No evidence of pulmonary embolism. 2. No acute vascular abnormality. 3. Bibasilar dependent atelectasis, without evidence of pleural effusion or pneumothorax. 4. Irregularity of the sternum as well as multiple  bilateral ribs, evaluation is limited due to significant respiratory motion, it may be artifact due  to motion or mildly displaced fractures in this patient with cardiopulmonary resuscitation. CTA abdomen/pelvis: 1.  No CT evidence of acute abdominal/pelvic process. 2. Bowel loops are normal in caliber. No evidence of obstruction, colitis or diverticulitis. 3. Gallbladder is distended with bile, without evidence of surrounding inflammatory changes. 4. Right femoral vein access central line with distal tip in the right internal iliac vein, repositioning could be considered if there is concern for the function of the central line. Electronically Signed   By: Keane Police D.O.   On: 03/30/2022 21:35   CT ABDOMEN PELVIS W CONTRAST  Result Date: 03/30/2022 CLINICAL DATA:  Pulmonary embolism suspected. EXAM: CT ANGIOGRAPHY CHEST AND ABDOMEN TECHNIQUE: Multidetector CT imaging of the chest and abdomen was performed using the standard protocol during bolus administration of intravenous contrast. Multiplanar CT image reconstructions and MIPs were obtained to evaluate the vascular anatomy. RADIATION DOSE REDUCTION: This exam was performed according to the departmental dose-optimization program which includes automated exposure control, adjustment of the mA and/or kV according to patient size and/or use of iterative reconstruction technique. CONTRAST:  42mL OMNIPAQUE IOHEXOL 350 MG/ML SOLN COMPARISON:  None Available. FINDINGS: CTA CHEST FINDINGS Cardiovascular: Satisfactory opacification of the pulmonary arteries to the segmental level. No evidence of pulmonary embolism. Normal heart size. No pericardial effusion. Mediastinum/Nodes: No enlarged mediastinal, hilar, or axillary lymph nodes. Thyroid is unremarkable. Endotracheal tube with distal tip approximately 1.3 cm above the carina. Lungs/Pleura: Bibasilar dependent atelectasis. No focal consolidation or pleural effusion. Musculoskeletal: Mild irregularity of the sternal  body and manubrium as well as cortical irregularity of bilateral ribs, it may be artifact due to significant respiratory motion or mildly displaced fractures in this patient with CPR. Review of the MIP images confirms the above findings. CTA ABDOMEN FINDINGS Aorta: Normal caliber aorta without aneurysm, dissection, vasculitis or significant stenosis. Celiac: Patent without evidence of aneurysm, dissection, vasculitis or significant stenosis. SMA: Patent without evidence of aneurysm, dissection, vasculitis or significant stenosis. Renals: Both renal arteries are patent without evidence of aneurysm, dissection, vasculitis, fibromuscular dysplasia or significant stenosis. IMA: Patent without evidence of aneurysm, dissection, vasculitis or significant stenosis. Inflow: Patent without evidence of aneurysm, dissection, vasculitis or significant stenosis. Veins: No obvious venous abnormality within the limitations of this arterial phase study. Right femoral access catheter with distal tip in the internal iliac vein. Hepatobiliary: No acute hepatic abnormality. No biliary ductal dilatation. Gallbladder is distended without pericholecystic inflammatory changes. Pancreas: Pancreas is unremarkable. No pancreatic ductal dilatation. Spleen: Normal in size without evidence of acute trauma. Adrenals/Urinary Tract: Adrenals are unremarkable. Symmetric perfusion of bilateral kidneys. No evidence of nephrolithiasis or hydronephrosis. No ureteral calculus. Urinary bladder is distended with Foley's catheter in place and small amount of air, likely iatrogenic. Stomach/Bowel: Stomach is unremarkable. Bowel loops are normal in caliber. No evidence of colitis or diverticulitis. Lymphatic: No lymphadenopathy. Musculoskeletal: No acute osseous abnormality. Other: None Review of the MIP images confirms the above findings. IMPRESSION: CT angio chest: 1. No evidence of pulmonary embolism. 2. No acute vascular abnormality. 3. Bibasilar dependent  atelectasis, without evidence of pleural effusion or pneumothorax. 4. Irregularity of the sternum as well as multiple bilateral ribs, evaluation is limited due to significant respiratory motion, it may be artifact due to motion or mildly displaced fractures in this patient with cardiopulmonary resuscitation. CTA abdomen/pelvis: 1.  No CT evidence of acute abdominal/pelvic process. 2. Bowel loops are normal in caliber. No evidence of obstruction, colitis or diverticulitis. 3. Gallbladder is distended with bile, without evidence  of surrounding inflammatory changes. 4. Right femoral vein access central line with distal tip in the right internal iliac vein, repositioning could be considered if there is concern for the function of the central line. Electronically Signed   By: Keane Police D.O.   On: 03/30/2022 21:35   CT HEAD WO CONTRAST (5MM)  Result Date: 03/30/2022 CLINICAL DATA:  Neuro deficit, acute stroke suspected.  Post CPR. EXAM: CT HEAD WITHOUT CONTRAST TECHNIQUE: Contiguous axial images were obtained from the base of the skull through the vertex without intravenous contrast. RADIATION DOSE REDUCTION: This exam was performed according to the departmental dose-optimization program which includes automated exposure control, adjustment of the mA and/or kV according to patient size and/or use of iterative reconstruction technique. COMPARISON:  None Available. FINDINGS: Brain: No evidence of acute infarction, hemorrhage, hydrocephalus, extra-axial collection or mass lesion/mass effect. Mild cerebral volume loss advanced for age. Vascular: No hyperdense vessel or unexpected calcification. Skull: Normal. Negative for fracture or focal lesion. Sinuses/Orbits: Patchy opacification of the ethmoid air cells. Remaining paranasal sinuses are clear. Other: None. IMPRESSION: 1.  No acute intracranial abnormality. 2.  Mild cerebral volume loss, advanced for age. 3.  Mild paranasal sinus disease. Electronically Signed   By:  Keane Police D.O.   On: 03/30/2022 21:09   DG Chest Portable 1 View  Result Date: 03/30/2022 CLINICAL DATA:  Cardiac arrest EXAM: PORTABLE CHEST 1 VIEW COMPARISON:  Previous studies including the examination of 02/23/2021 FINDINGS: Transverse diameter of heart is slightly increased. Tip of endotracheal tube is 4 cm above the carina. Distal portion of enteric tube is seen in the stomach. There are patchy densities in left upper lung field and left lower lung field. No focal abnormalities are seen in right lung. Costophrenic angles are clear. There is no pneumothorax. IMPRESSION: There are patchy densities in left upper and left lower lung fields suggesting atelectasis/pneumonia. Electronically Signed   By: Elmer Picker M.D.   On: 03/30/2022 19:16     Assessment and Plan:   Ventricular fibrillation and cardiac arrest: Patient had witnessed cardiac arrest in the emergency department, though unfortunately she was not on telemetry at the time.  By report, initial rhythm was ventricular fibrillation, treated with defibrillation and IV amiodarone.  She has a history of polymorphic ventricular tachycardia in the setting of QT prolongation and electrolyte derangements.  She was quite hypomagnesemic on arrival with a magnesium of 1.3.  Her potassium has also been persistently low.  Event may have been precipitated by underlying channel apathy, electrolyte abnormalities, and administration of QT prolonging medications such as droperidol. -QT prolonging medications, including antiemetics such as droperidol and ondansetron, should be avoided. -Replete electrolytes to maintain potassium and magnesium levels greater than 4.0 and 2.0, respectively. -Follow-up echocardiogram. -Given young age and low risk exercise tolerance test earlier this year, defer ischemia evaluation at this time.  I suspect mild troponin elevation represents demand ischemia in the setting of cardiac arrest, CPR, and  defibrillation. -Repeat EKG this AM to assess QTc.  COVID-19: Unclear if this may have contributed to recent URI symptoms and nausea or if this is an incidental finding.  Though unlikely, cardiomyopathy and arrhythmias have been described with COVID-19. -Follow-up echocardiogram. -Ongoing management per PCCM.  Polysubstance abuse: -Continue EtOH withdrawal protocol. -Encourage cessation when patient is extubated and alert.  For questions or updates, please contact [place] Please consult [email or web address] for contact info under [facility].  Signed, Nelva Bush, MD  03/31/2022 8:32 AM

## 2022-03-31 NOTE — Consult Note (Signed)
PHARMACY CONSULT NOTE  Pharmacy Consult for Electrolyte Monitoring and Replacement   Recent Labs: Potassium (mmol/L)  Date Value  03/31/2022 3.6   Magnesium (mg/dL)  Date Value  03/31/2022 2.5 (H)   Calcium (mg/dL)  Date Value  03/31/2022 7.6 (L)   Albumin (g/dL)  Date Value  03/30/2022 3.4 (L)  02/05/2021 4.9 (H)   Phosphorus (mg/dL)  Date Value  03/31/2022 5.0 (H)   Sodium (mmol/L)  Date Value  03/31/2022 142  02/05/2021 141   Assessment: Patient is a 41 y/o F with medical history including alcoholic cirrhosis, EtOH use disorder, tobacco use disorder, Kawasaki's disease, NSVT, hypokalemia, hypomagnesemia, alcohol related gastritis who presented to the ED 9/19 with N/V. Patient subsequently experienced V fib cardiac arrest suspected to be secondary severe electrolyte derangements. Patient was briefly intubated but subsequently extubated and is now on room air. Pharmacy consulted to assist with electrolyte monitoring and replacement as indicated.  Goal of Therapy:  K > 4 and Mg > 2  Plan:  --K 3.6, will give Kcl 40 mEq PO x 1 dose --Follow-up electrolytes with AM labs tomorrow  Benita Gutter 03/31/2022 12:39 PM

## 2022-03-31 NOTE — Progress Notes (Signed)
*  PRELIMINARY RESULTS* Echocardiogram 2D Echocardiogram has been performed.  Yvette [name] 03/31/2022, 10:01 AM

## 2022-03-31 NOTE — Progress Notes (Signed)
Progress Notes   Pt alert but disoriented to situation stating she wants to go home.  I explained to the pt she is NOT stable for discharge home at this time.  Following conversation pt sitting on the bed on the phone.  If pt attempts to leave against medical advice she will need Involuntarily Commitment.    Donell Beers, [place] Pager [phone] (please enter 7 digits) PCCM Consult Pager [phone] (please enter 7 digits)

## 2022-03-31 NOTE — Progress Notes (Signed)
PT extubated to 2l [place] with no complications.

## 2022-03-31 NOTE — Progress Notes (Addendum)
0930  Patient extubated to 2L [place]. Patient alert and oriented x4, following commands. Pressures stables.   1030   Patient declined visitors at this time. Visitor updated and registration desk notified. Update called to father regarding patient condition and status. Father stated he would update significant other. Will continue to monitor.   1130 Yale swallow study passed. Diet updated. EKG done,  in chart. Will continue to monitor.   1600 Patient refuses to use cal light, continues to get out of bed. Patient educated on fall risk. Tried to use bed alarm, bed is saying weigh too low. Chair alarm pad placed under patient. Floor mats placed. Will continue to encourage patient to call, and wait for nurse before standing up. Patient is not steady on feet at this time.   1800 Patient disoriented to situation. Needs redirection. Patient continues to try to get out of bed independently.  Patient can answers orientation questions but is confused about events during hospital stay. Patient reoriented to situation.. Patient has increased agitation. Patient currently in bed. Patient states wants to go home and outside to smoke. Patient refused nicotine patch. NP made aware.   NP made aware central line left in place. IV in hand malpositioned. Unable to assess left AC IV due to patient agitation.  Will continue to monitor for removal.  1900  Patient currently in bed with bed alarm in place and television on.    Will continue to monitor.

## 2022-03-31 NOTE — Progress Notes (Signed)
eLink Physician-Brief Progress Note Patient Name: Yvette [name] DOB: 07-06-1981 MRN: [id number]   Date of Service  03/31/2022  HPI/Events of Note  41 yr old female with hx of ETOH abuse and recurrent admissions for nausea and vomiting presented again with this complaint then had VF arrest after receiving droperidol.  Now intubated and in ICU.  eICU Interventions  Chart reviewed     Intervention Category Evaluation Type: New Patient Evaluation  Mauri Brooklyn, P 03/31/2022, 4:03 AM

## 2022-03-31 NOTE — Progress Notes (Signed)
NAME:  Yvette [name], MRN:  [id number], DOB:  07/15/1980, LOS: 1 ADMISSION DATE:  03/30/2022, CONSULTATION DATE:  03/30/22 REFERRING MD:  Dr. Darnelle Catalan, CHIEF COMPLAINT:  Cardiac arrest   History of Present Illness/SYNOPSIS  41 yo F presenting to [facility] ED from home via EMS on 03/30/22 with complaints of nausea/vomiting since midnight 9/19. History obtained via ED documentation, supplemented by father Kynslee Baham via phone interview. She reported in triage that she had taken medication without relief and was still vomiting clear liquid. She denied abdominal pain. Her father reported that she had complained of a cold symptoms over the last few days, otherwise was in her normal state of health. She told ED staff that her last drink was Sunday day.  Per chart review the patient has had multiple admissions with gastritis and Transaminitis during which time she develops refractory nausea & vomiting with hypokalemia & hypomagnesemia. She also has a previous history of polymorphic ventricular tachycardia. In June 2023 she reported cutting back on her alcohol consumption to 1-2 16 oz beers daily.  ED course: Upon arrival patient afebrile with stable vital signs complaining of nausea and vomiting but no pain.  While being observed in the ED where she was ambulatory the patient refused to wear monitoring equipment requesting nausea medication.  After she received droperidol she subsequently became agonal which was noticed immediately by her nurse bedside.  Patient respiratory arrested requiring bag-valve-mask support.  No palpable pulse and CPR was started with the first recognized rhythm as V-fib.  The patient was defibrillated with ACLS medications per protocol provided.  Next pulse check showed V. tach with a palpable pulse heart rate 200 and BP 100/50.  300 mg amiodarone was given and she was synchronized cardioverted to sinus tach.  The patient was then emergently intubated and placed on mechanical ventilatory  support.  COVID-19: positive  ABG: 7.14/ 55/ 122/ 18.7 CXR 03/30/22: there are patchy densities in the LUL & LLL suggesting atelectasis vs pneumonia CT head wo contrast 03/30/22: no acute intracranial abnormality. Mild cerebral volume loss, advanced for age. Mild paranasal sinus disease. CT angio chest PE w or wo contrast 03/30/22: no evidence of PE, no acute vascular abnormality. Bibasilar dependent atelectasis. Irregularity of the sternum as well as multiple bilateral broken ribs. CT abdomen/pelvis w contrast 03/30/22: no acute process. Gallbladder distended with bile.  PCCM consulted for admission due to acute respiratory arrest leading to cardiac arrest and mechanical intubation requiring ventilatory support.  Pertinent  Medical History  Alcoholic cirrhosis EtOH abuse Alcohol related gastritis Asthma Kawasaki's disease NSVT Anemia Tobacco abuse Transaminitis Hypokalemia Hypomagnesemia  Significant Hospital Events: Including procedures, antibiotic start and stop dates in addition to other pertinent events   03/30/22: Admit to ICU post acute respiratory arrest leading to cardiac arrest and mechanical intubation requiring ventilatory support.        9 minutes downtime, received 1 defibrillation & 1 synchronized       cardioversion 9/19 COVID + 9/20 remains critically ill remains on vent, NORMOTHERMIA PROTOCOL  Interim History / Subjective:  Remains intubated Remains on vent Plan for rapid EEG monitoring   Objective   Blood pressure 101/84, pulse 83, temperature 97.8 F (36.6 C), temperature source Oral, resp. rate 15, height 5\' 2"  (1.575 m), weight 51.2 kg, SpO2 100 %.    Vent Mode: PRVC FiO2 (%):  [40 %] 40 % Set Rate:  [14 bmp-20 bmp] 14 bmp Vt Set:  [400 mL-450 mL] 400 mL PEEP:  [5 cmH20]  Maddock Pressure:  [24 cmH20] 24 cmH20   Intake/Output Summary (Last 24 hours) at 03/31/2022 0752 Last data filed at 03/31/2022 0600 Gross per 24 hour  Intake 1486.68 ml   Output 875 ml  Net 611.68 ml    Filed Weights   03/30/22 1233 03/30/22 2115 03/31/22 0500  Weight: 55 kg 51.2 kg 51.2 kg      Assessment & Plan:  41 yo AAF with end satge liver cirrhosis admitted to ICU for acute and sudden Cardiac arrest: initial rhythm V-fib suspect secondary to R-on-T PVC in the setting of prolonged Qtc & severe hypomagnesemia, Hypokalemia and COVID 19 infection  Severe ACUTE Hypoxic and Hypercapnic Respiratory Failure -continue Mechanical Ventilator support -Wean Fio2 and PEEP as tolerated -VAP/VENT bundle implementation - Wean PEEP & FiO2 as tolerated, maintain SpO2 > 88% - Head of bed elevated 30 degrees, VAP protocol in place - Plateau pressures less than 30 cm H20  - Intermittent chest x-ray & ABG PRN - Ensure adequate pulmonary hygiene  -will perform SAT/SBT when respiratory parameters are met  V Fib arrest  UDS: negative - candidate for Normothermia Protocol, currently afebrile - Continue vasopressors: levophed PRN, to maintain MAP > 65 - Echocardiogram ordered - Trend troponin, lactic - Cardiology consulted, appreciate input - aggressive Mg & K+ supplementation - f/u EKG - avoid QTc prolonging medications   Severe COVID-19 infection, ARDS and pneumonia/pneumonitis Continue IV steroids  Aggressive pulm toilet recommended Pulmonary hygiene Continue proning as tolerated due to severe hypoxia   Maintain airborne and contact precautions  As needed bronchodilators (MDI) Vitamin C and zinc High risk for  death   Acute hypoxemic respiratory failure due to COVID-19 pneumonia/ARDS Mechanical ventilation via ARDS protocol, target PRVC 6 cc/kg Wean PEEP and FiO2 as able Goal plateau pressure less than 30, driving pressure less than 15 Paralytics if necessary for vent synchrony, gas exchange Cycle prone positioning if necessary for oxygenation Deep sedation per PAD protocol diuresis as tolerated based on Kidney function VAP prevention order  set Follow inflammatory markers as needed with CRP Vitamin C, zinc as indicated Plan to repeat and check resp cultures if needed   Leukopenia Query Aspiration  - daily CBC, monitor WBC & fever curve - trend lactic & PCT - Unasyn as above - follow cultures  Transaminitis in the setting of Alcoholic Cirrhosis Gastritis - Trend hepatic function - avoid hepatotoxic agents - continue outpatient PPI BID   NEUROLOGY ACUTE METABOLIC ENCEPHALOPATHY -need for sedation -Goal RASS -2 to -3 At risk for anoxic encephalopathy ETOH abuse at risk for withdrawal symptoms 9 minutes downtime, Initial head CT: negative, will see how patient responds once intubation medications have worn off - PAD protocol in place: midazolam IVP & fentanyl drip - RASS goal: -1 - RAPID  EEG pending - repeat CT head PRN - multivitamin, folic acid, thiamine daily   ENDO - ICU hypoglycemic\Hyperglycemia protocol -check FSBS per protocol   GI GI PROPHYLAXIS as indicated  NUTRITIONAL STATUS DIET-->TF's as tolerated Constipation protocol as indicated   ELECTROLYTES -follow labs as needed -replace as needed -pharmacy consultation and following   Best Practice (right click and "Reselect all SmartList Selections" daily)  Diet/type: NPO w/ meds via tube DVT prophylaxis: LMWH GI prophylaxis: PPI Lines: Central line and yes and it is still needed Foley:  Yes, and it is still needed Code Status:  full code Last date of multidisciplinary goals of care discussion [N/A]  Labs   CBC: Recent Labs  Lab  03/30/22 1242 03/30/22 2154  WBC 3.6* 6.1  NEUTROABS  --  5.4  HGB 11.8* 9.8*  HCT 36.6 30.1*  MCV 80.8 82.0  PLT 137* 105*     Basic Metabolic Panel: Recent Labs  Lab 03/30/22 1242 03/30/22 1246 03/30/22 2154 03/31/22 0209  NA 139  --  140 142  K 3.4*  --  3.4* 3.3*  CL 100  --  106 108  CO2 22  --  19* 24  GLUCOSE 150*  --  170* 121*  BUN 8  --  7 7  CREATININE 0.81  --  0.87 0.85   CALCIUM 9.3  --  7.3* 7.6*  MG  --  1.3* 1.2* 4.7*  PHOS  --  2.8 4.1 1.4*    GFR: Estimated Creatinine Clearance: 71 mL/min (by C-G formula based on SCr of 0.85 mg/dL). Recent Labs  Lab 03/30/22 1242 03/30/22 1246 03/30/22 2021 03/30/22 2154 03/31/22 0209  PROCALCITON  --  <0.10  --   --  <0.10  WBC 3.6*  --   --  6.1  --   LATICACIDVEN  --   --  1.9 3.6* 1.7     Liver Function Tests: Recent Labs  Lab 03/30/22 1242 03/30/22 2154  AST 229* 424*  ALT 80* 96*  ALKPHOS 109 80  BILITOT 0.8 0.8  PROT 9.1* 6.7  ALBUMIN 4.9 3.4*    Recent Labs  Lab 03/30/22 1242  LIPASE 22    No results for input(s): "AMMONIA" in the last 168 hours.  ABG    Component Value Date/Time   PHART 7.52 (H) 03/31/2022 0511   PCO2ART 28 (L) 03/31/2022 0511   PO2ART 99 03/31/2022 0511   HCO3 22.9 03/31/2022 0511   ACIDBASEDEF 1.4 03/30/2022 2345   O2SAT 98 03/31/2022 0511     Coagulation Profile: Recent Labs  Lab 03/30/22 2021 03/30/22 2154  INR 1.2 1.2    Cardiac Enzymes: No results for input(s): "CKTOTAL", "CKMB", "CKMBINDEX", "TROPONINI" in the last 168 hours.  HbA1C: Hgb A1c MFr Bld  Date/Time Value Ref Range Status  03/30/2022 12:30 PM 12.5 (H) 4.8 - 5.6 % Final    Comment:    (NOTE) Pre diabetes:          5.7%-6.4%  Diabetes:              >6.4%  Glycemic control for   <7.0% adults with diabetes     CBG: Recent Labs  Lab 03/30/22 2107 03/31/22 0014 03/31/22 0342  GLUCAP 179* 112* 123*    Review of Systems:   UTA- patient unable to participate in interview at this time  Past Medical History:  She,  has a past medical history of Asthma, ETOH abuse, H/O Kawasaki's disease, History of anemia, History of blood transfusion (1984), Hypokalemia, Hypomagnesemia, Leukopenia, Neutropenia ([place]), NSVT (nonsustained ventricular tachycardia) ([place]), Tobacco abuse, and Transaminitis.   Surgical History:   Past Surgical History:  Procedure Laterality Date    ESOPHAGOGASTRODUODENOSCOPY N/A 10/29/2020   Procedure: ESOPHAGOGASTRODUODENOSCOPY (EGD);  Surgeon: Jonathon Bellows, MD;  Location: [facility] ENDOSCOPY;  Service: Gastroenterology;  [name];   ESOPHAGOGASTRODUODENOSCOPY (EGD) WITH PROPOFOL N/A 03/14/2020   Procedure: ESOPHAGOGASTRODUODENOSCOPY (EGD) WITH PROPOFOL;  Surgeon: Virgel Manifold, MD;  [name];  Service: Endoscopy;  [name];   ESOPHAGOGASTRODUODENOSCOPY (EGD) WITH PROPOFOL N/A 02/25/2021   Procedure: ESOPHAGOGASTRODUODENOSCOPY (EGD) WITH PROPOFOL;  Surgeon: Virgel Manifold, MD;  [name];  Service: Endoscopy;  [name];     Social History:  reports that she has been smoking cigarettes. She has a 2.50 pack-year smoking history. She has never used smokeless tobacco. She reports current alcohol use of about 61.0 standard drinks of alcohol per week. She reports that she does not use drugs.   Family History:  Her family history includes Cancer in her paternal aunt and paternal aunt; Lupus in her mother; Prostate cancer in her father.   Allergies No Known Allergies   Home Medications  Prior to Admission medications   Medication Sig Start Date End Date Taking? Authorizing Provider  DEPO-PROVERA 150 MG/ML injection Inject 150 mg into the muscle every 3 (three) months. 03/22/22  Yes [provider]  DIFLUCAN 150 MG tablet Take by mouth. 03/22/22  Yes [provider]  mirtazapine (REMERON) 15 MG tablet Take 15 mg by mouth at bedtime. 08/12/21  Yes [provider]  pantoprazole (PROTONIX) 40 MG tablet Take 40 mg by mouth daily. 08/12/21  Yes [provider]  naltrexone (DEPADE) 50 MG tablet Take 50 mg by mouth daily. 03/22/22   [provider]  ondansetron (ZOFRAN-ODT) 8 MG disintegrating tablet Take 1 tablet (8 mg total) by mouth every 8 (eight) hours as needed for nausea or vomiting. Patient not taking: Reported on 03/30/2022 01/10/22   Naaman Plummer,  MD  promethazine (PHENERGAN) 25 MG suppository Place 1 suppository (25 mg total) rectally every 6 (six) hours as needed for nausea or vomiting. Patient not taking: Reported on 03/30/2022 01/10/22   Nance Pear, MD         DVT/GI PRX  assessed I Assessed the need for Labs I Assessed the need for Foley I Assessed the need for Central Venous Line Family Discussion when available I Assessed the need for Mobilization I made an Assessment of medications to be adjusted accordingly Safety Risk assessment completed  CASE DISCUSSED IN MULTIDISCIPLINARY ROUNDS WITH ICU TEAM     Critical Care Time devoted to patient care services described in this note is 55 minutes.  Critical care was necessary to treat /prevent imminent and life-threatening deterioration. Overall, patient is critically ill, prognosis is guarded.  Patient with Multiorgan failure and at high risk for cardiac arrest and death.    Corrin Parker, M.D.  [facility]  Medical Director [place] Director [facility] Cardio-Pulmonary Department

## 2022-04-01 DIAGNOSIS — K7030 Alcoholic cirrhosis of liver without ascites: Secondary | ICD-10-CM

## 2022-04-01 DIAGNOSIS — I5022 Chronic systolic (congestive) heart failure: Secondary | ICD-10-CM

## 2022-04-01 DIAGNOSIS — J9601 Acute respiratory failure with hypoxia: Secondary | ICD-10-CM | POA: Diagnosis not present

## 2022-04-01 DIAGNOSIS — I469 Cardiac arrest, cause unspecified: Secondary | ICD-10-CM

## 2022-04-01 DIAGNOSIS — E876 Hypokalemia: Secondary | ICD-10-CM

## 2022-04-01 DIAGNOSIS — F1010 Alcohol abuse, uncomplicated: Secondary | ICD-10-CM

## 2022-04-01 DIAGNOSIS — D696 Thrombocytopenia, unspecified: Secondary | ICD-10-CM

## 2022-04-01 DIAGNOSIS — U071 COVID-19: Secondary | ICD-10-CM

## 2022-04-01 LAB — GLUCOSE, CAPILLARY
Glucose-Capillary: 66 mg/dL — ABNORMAL LOW (ref 70–99)
Glucose-Capillary: 73 mg/dL (ref 70–99)
Glucose-Capillary: 90 mg/dL (ref 70–99)
Glucose-Capillary: 88 mg/dL (ref 70–99)
Glucose-Capillary: 94 mg/dL (ref 70–99)
Glucose-Capillary: 82 mg/dL (ref 70–99)

## 2022-04-01 LAB — C-REACTIVE PROTEIN: CRP: 2.9 mg/dL — ABNORMAL HIGH (ref <1.0)

## 2022-04-01 LAB — HEMOGLOBIN A1C
Hgb A1c MFr Bld: 5.6 % (ref 4.8–5.6)
Mean Plasma Glucose: 114.02 mg/dL

## 2022-04-01 LAB — BASIC METABOLIC PANEL
Anion gap: 13 (ref 5–15)
BUN: 5 mg/dL — ABNORMAL LOW (ref 6–20)
CO2: 25 mmol/L (ref 22–32)
Calcium: 8.3 mg/dL — ABNORMAL LOW (ref 8.9–10.3)
Chloride: 102 mmol/L (ref 98–111)
Creatinine, Ser: 0.68 mg/dL (ref 0.44–1.00)
GFR, Estimated: >60 mL/min (ref >60)
Glucose, Bld: 84 mg/dL (ref 70–99)
Potassium: 3.5 mmol/L (ref 3.5–5.1)
Sodium: 140 mmol/L (ref 135–145)

## 2022-04-01 LAB — FERRITIN: Ferritin: 25 ng/mL (ref 11–307)

## 2022-04-01 LAB — D-DIMER, QUANTITATIVE: D-Dimer, Quant: 5.4 ug/mL-FEU — ABNORMAL HIGH (ref 0.00–0.50)

## 2022-04-01 LAB — MAGNESIUM: Magnesium: 1.9 mg/dL (ref 1.7–2.4)

## 2022-04-01 LAB — PHOSPHORUS: Phosphorus: 2.2 mg/dL — ABNORMAL LOW (ref 2.5–4.6)

## 2022-04-01 LAB — PROCALCITONIN: Procalcitonin: <0.1 ng/mL

## 2022-04-01 MED ORDER — LORAZEPAM 2 MG/ML IJ SOLN
0.5000 mg | INTRAMUSCULAR | Status: DC | PRN
  Administered 2022-04-01: 0.5 mg via INTRAVENOUS
  Filled 2022-04-01: qty 1

## 2022-04-01 MED ORDER — CARVEDILOL 3.125 MG PO TABS
3.1250 mg | ORAL_TABLET | Freq: Two times a day (BID) | ORAL | Status: DC
  Administered 2022-04-01: 3.125 mg via ORAL
  Filled 2022-04-01 (×2): qty 1

## 2022-04-01 MED ORDER — POTASSIUM & SODIUM PHOSPHATES 280-160-250 MG PO PACK
1.0000 | PACK | Freq: Three times a day (TID) | ORAL | Status: DC
  Administered 2022-04-01 (×2): 1 via ORAL
  Filled 2022-04-01 (×3): qty 1

## 2022-04-01 MED ORDER — HALOPERIDOL LACTATE 5 MG/ML IJ SOLN
1.0000 mg | Freq: Four times a day (QID) | INTRAMUSCULAR | Status: DC | PRN
  Administered 2022-04-01: 1 mg via INTRAMUSCULAR
  Filled 2022-04-01: qty 1

## 2022-04-01 MED ORDER — POTASSIUM CHLORIDE 20 MEQ PO PACK: 40.0000 meq | PACK | Freq: Once | ORAL | Status: DC

## 2022-04-01 MED ORDER — POTASSIUM CHLORIDE 10 MEQ/100ML IV SOLN
10.0000 meq | INTRAVENOUS | Status: AC
  Administered 2022-04-02 (×4): 10 meq via INTRAVENOUS
  Filled 2022-04-01 (×4): qty 100

## 2022-04-01 MED ORDER — MAGNESIUM SULFATE 2 GM/50ML IV SOLN
2.0000 g | Freq: Once | INTRAVENOUS | Status: AC
  Administered 2022-04-01: 2 g via INTRAVENOUS
  Filled 2022-04-01: qty 50

## 2022-04-01 MED ORDER — POTASSIUM CHLORIDE CRYS ER 20 MEQ PO TBCR
40.0000 meq | EXTENDED_RELEASE_TABLET | Freq: Once | ORAL | Status: AC
  Administered 2022-04-01: 40 meq via ORAL
  Filled 2022-04-01: qty 2

## 2022-04-01 MED ORDER — K PHOS MONO-SOD PHOS DI & MONO 155-852-130 MG PO TABS
500.0000 mg | ORAL_TABLET | Freq: Once | ORAL | Status: AC
  Administered 2022-04-01: 500 mg via ORAL
  Filled 2022-04-01: qty 2

## 2022-04-01 NOTE — Consult Note (Signed)
[place] for Electrolyte Monitoring and Replacement   Recent Labs: Potassium (mmol/L)  Date Value  04/01/2022 3.5   Magnesium (mg/dL)  Date Value  04/01/2022 1.9   Calcium (mg/dL)  Date Value  04/01/2022 8.3 (L)   Albumin (g/dL)  Date Value  03/30/2022 3.4 (L)  02/05/2021 4.9 (H)   Phosphorus (mg/dL)  Date Value  04/01/2022 2.2 (L)   Sodium (mmol/L)  Date Value  04/01/2022 140  02/05/2021 141   Assessment: Patient is a 41 y/o F with medical history including alcoholic cirrhosis, EtOH use disorder, tobacco use disorder, Kawasaki's disease, NSVT, hypokalemia, hypomagnesemia, alcohol related gastritis who presented to the ED 9/19 with N/V. Patient subsequently experienced V fib cardiac arrest suspected to be secondary severe electrolyte derangements. Patient was briefly intubated but subsequently extubated and is now on room air. Pharmacy consulted to assist with electrolyte monitoring and replacement as indicated.  Goal of Therapy:  K > 4 and Mg > 2  Plan:  --K 3.5, Kcl 40 mEq PO x 1 dose --Mg 1.9, magnesium sulfate 2 g IV x 1 dose --Phos 2.2, K Phos Neutral 500 mg PO x 1 dose --Patient care transferred from [facility] to [facility]. Will discontinue electrolyte consult at this time. Defer further ordering of labs and electrolyte replacement to primary team --Pharmacy will continue to follow along peripherally  Benita Gutter 04/01/2022 11:34 AM

## 2022-04-01 NOTE — Progress Notes (Signed)
Patient continues to grow more agitated and confused; RASS +2. RN unable to reorient patient to place or situation. Patient believes she is at home and not at the hospital despite reorientation efforts. Multiple attempts by patient to get out of the bed despite safety precautions and 1:1 sitter. 0.5mg  ativan given at 2115 without effect. Provider contacted via secure chat; response pending.

## 2022-04-01 NOTE — Progress Notes (Signed)
Pt is alert disoriented to timeand situation. No complaints of nausea or pain. Pt is clam and co-operatve..currently in bed Bed alarm on.will continue to observe closely.

## 2022-04-01 NOTE — Progress Notes (Signed)
OT Cancellation Note  Patient Details Name: Yvette [name] MRN: [id number] DOB: [birth year]   Cancelled Treatment:    Reason Eval/Treat Not Completed: OT screened, no needs identified, will sign off. OT order received and chart reviewed. OT arrives to pt room to find pt standing up in room , adjusting hospital gown, and ambulating to ultimately seat in recliner chair. Pt does not appear to have any LOB while up during this time. Pt screened at this time with no acute needs. OT to complete order.   Darleen Crocker, MS, OTR/L , CBIS ascom [phone]  04/01/22, 10:18 AM

## 2022-04-01 NOTE — Progress Notes (Signed)
Patient alert to self, place, and year. Disoriented to situation. Patient continues to disconnect monitoring devices. Patient refuses to stay in bed. Bed alarm does not work. Chair alarm is in place. Floor mats are down. Providing redirection to patient and encouraging patient to bed. Patient tolerating being redirected to bed or chair.  1800  Patient worried about being moms caretaker,states she needs to leave. Patient encouraged to call mom and check on her. Patient's father calls, states she said she was in the ED and needed him to pick her up. Father reassured that patient was in room. Father encourage to visit patient to help calm her anxiety.  1830 Patient agitated, refusing to stay in bed. Not steady on feet. Reoriented.   45 Dad expresses worried about daughter, states she thinks she is going to die. Asked patient why they felt this way, patient stated " because she was going to kill herself if her dad did not take her home."  Charged nurse notified, Dr Roosevelt Locks called, situation discussed ask to call night coverage. Oncoming MD sent secure chat.   Dr Sidney Ace notified of patient comment. MD placed orders for PRN medication and a safety sitter order placed. MD did not feel IVC orders were neccessary at this time.   Sitter in room, father at bedside, patient and father updated.

## 2022-04-01 NOTE — Progress Notes (Signed)
Rounding Note    Patient Name: Yvette [name] Date of Encounter: 04/01/2022  [place] Cardiologist: Kathlyn Sacramento, MD   Subjective   On rounds was extubated, family at the bedside To gust recent events with her, cardiac arrest in the emergency room with ventricular fibrillation, then VT, cardioverted, intubated for airway protection Extubated September 20 in the morning Magnesium, potassium has been repleted Long discussion with her with parents at the bedside, she reports that she continues to drink beer, several at a time daily, every other day Has a 39 year old daughter  Inpatient Medications    Scheduled Meds:  vitamin C  500 mg Oral Daily   carvedilol  3.125 mg Oral BID WC   chlordiazePOXIDE  25 mg Oral TID   Followed by   Derrill Memo ON 04/02/2022] chlordiazePOXIDE  25 mg Oral BH-qamhs   Followed by   Derrill Memo ON 04/03/2022] chlordiazePOXIDE  25 mg Oral Daily   Chlorhexidine Gluconate Cloth  6 each Topical Q0600   enoxaparin (LOVENOX) injection  40 mg Subcutaneous Q24H   feeding supplement  237 mL Oral TID BM   folic acid  1 mg Oral Daily   insulin aspart  0-15 Units Subcutaneous Q4H   molnupiravir EUA  4 capsule Oral BID   multivitamin with minerals  1 tablet Oral Daily   nicotine  14 mg Transdermal Daily   potassium & sodium phosphates  1 packet Oral TID AC & HS   sodium chloride flush  3 mL Intravenous Q12H   thiamine  100 mg Oral Daily   zinc sulfate  220 mg Oral Daily   Continuous Infusions:  sodium chloride     PRN Meds: sodium chloride, acetaminophen, docusate sodium, mouth rinse, mouth rinse, polyethylene glycol, sodium chloride flush   Vital Signs    Vitals:   04/01/22 1100 04/01/22 1200 04/01/22 1300 04/01/22 1410  BP: (!) 123/99 (!) 119/103 (!) 116/93 (!) 124/99  Pulse:  (!) 116 (!) 103 (!) 103  Resp: (!) 28 11 20    Temp:  98.7 F (37.1 C)    TempSrc:  Oral    SpO2:  100% 100%   Weight:      Height:        Intake/Output Summary  (Last 24 hours) at 04/01/2022 1453 Last data filed at 04/01/2022 0900 Gross per 24 hour  Intake 20 ml  Output 550 ml  Net -530 ml      04/01/2022    5:00 AM 03/31/2022    5:00 AM 03/30/2022    9:15 PM  Last 3 Weights  Weight (lbs) 112 lb 14 oz 112 lb 14 oz 112 lb 14 oz  Weight (kg) 51.2 kg 51.2 kg 51.2 kg      Telemetry    Normal sinus rhythm- Personally Reviewed  ECG     - Personally Reviewed  Physical Exam   GEN: No acute distress.   Neck: No JVD Cardiac: RRR, no murmurs, rubs, or gallops.  Respiratory: Clear to auscultation bilaterally. GI: Soft, nontender, non-distended  MS: No edema; No deformity. Neuro:  Nonfocal  Psych: Normal affect   Labs    High Sensitivity Troponin:   Recent Labs  Lab 03/30/22 1242 03/30/22 2021 03/30/22 2154 03/31/22 0209 03/31/22 0608  TROPONINIHS 5 24* 62* 101* 90*     Chemistry Recent Labs  Lab 03/30/22 1242 03/30/22 1246 03/30/22 2154 03/31/22 0209 03/31/22 1455 04/01/22 0804  NA 139  --  140 142  --  140  K  3.4*  --  3.4* 3.3* 3.6 3.5  CL 100  --  106 108  --  102  CO2 22  --  19* 24  --  25  GLUCOSE 150*  --  170* 121*  --  84  BUN 8  --  7 7  --  5*  CREATININE 0.81  --  0.87 0.85  --  0.68  CALCIUM 9.3  --  7.3* 7.6*  --  8.3*  MG  --    < > 1.2* 4.7* 2.5* 1.9  PROT 9.1*  --  6.7  --   --   --   ALBUMIN 4.9  --  3.4*  --   --   --   AST 229*  --  424*  --   --   --   ALT 80*  --  96*  --   --   --   ALKPHOS 109  --  80  --   --   --   BILITOT 0.8  --  0.8  --   --   --   GFRNONAA >60  --  >60 >60  --  >60  ANIONGAP 17*  --  15 10  --  13   < > = values in this interval not displayed.    Lipids  Recent Labs  Lab 03/31/22 0209  TRIG 89    Hematology Recent Labs  Lab 03/30/22 1242 03/30/22 2154  WBC 3.6* 6.1  RBC 4.53 3.67*  HGB 11.8* 9.8*  HCT 36.6 30.1*  MCV 80.8 82.0  MCH 26.0 26.7  MCHC 32.2 32.6  RDW 16.4* 16.4*  PLT 137* 105*   Thyroid No results for input(s): "TSH", "FREET4" in the  last 168 hours.  BNP Recent Labs  Lab 03/30/22 1246  BNP 11.2    DDimer  Recent Labs  Lab 03/31/22 0209 04/01/22 0804  DDIMER 3.64* 5.40*     Radiology    ECHOCARDIOGRAM COMPLETE  Result Date: 03/31/2022    ECHOCARDIOGRAM REPORT   Patient Name:   Yvette [name] Date of Exam: 03/31/2022 Medical Rec #:  [id number]      Height:       62.0 in Accession #:    [id number]     Weight:       112.9 lb Date of Birth:  [birth year]     BSA:          1.499 m Patient Age:    41 years       BP:           101/84 mmHg Patient Gender: F              HR:           83 bpm. Exam Location:  [facility] Procedure: 2D Echo, Color Doppler and Cardiac Doppler Indications:     Acute respiratory distress R06.03  History:         Patient has prior history of Echocardiogram examinations, most                  recent 06/30/2021. ETOH abuse, tobacco abuse.  Sonographer:     Sherrie Sport Referring Phys:  [id number] Flora Lipps Diagnosing Phys: Nelva Bush MD  Sonographer Comments: Technically challenging study due to limited acoustic windows, no apical window and no subcostal window. Pt is non-verbal and could not breath hold. IMPRESSIONS  1. Left ventricular ejection fraction, by estimation, is 35 to 40%. The left ventricle has moderately  decreased function. The left ventricle demonstrates regional wall motion abnormalities (see scoring diagram/findings for description). Left ventricular  diastolic function could not be evaluated. There is severe hypokinesis of the left ventricular, entire anteroseptal wall and anterior wall.  2. Right ventricular systolic function is normal. The right ventricular size is normal.  3. The mitral valve is normal in structure. No evidence of mitral valve regurgitation.  4. The aortic valve is tricuspid. Aortic valve regurgitation is not visualized. Aortic valve gradient could not be assessed due to limited acoustic windows. FINDINGS  Left Ventricle: Left ventricular ejection fraction, by estimation, is 35 to  40%. The left ventricle has moderately decreased function. The left ventricle demonstrates regional wall motion abnormalities. Severe hypokinesis of the left ventricular, entire  anteroseptal wall and anterior wall. The left ventricular internal cavity size was normal in size. There is no left ventricular hypertrophy. Left ventricular diastolic function could not be evaluated. Right Ventricle: The right ventricular size is normal. No increase in right ventricular wall thickness. Right ventricular systolic function is normal. Left Atrium: Left atrial size was not well visualized. Right Atrium: Right atrial size was not well visualized. Pericardium: The pericardium was not well visualized. Mitral Valve: The mitral valve is normal in structure. No evidence of mitral valve regurgitation. Tricuspid Valve: The tricuspid valve is normal in structure. Tricuspid valve regurgitation is not demonstrated. Aortic Valve: The aortic valve is tricuspid. Aortic valve regurgitation is not visualized. Aortic valve gradient could not be assessed due to limited acoustic windows. Pulmonic Valve: The pulmonic valve was normal in structure. Pulmonic valve regurgitation is mild. No evidence of pulmonic stenosis. Aorta: The aortic root is normal in size and structure. Pulmonary Artery: The pulmonary artery is of normal size. IAS/Shunts: The interatrial septum was not well visualized.  LEFT VENTRICLE PLAX 2D LVIDd:         4.10 cm LVIDs:         3.00 cm LV PW:         0.70 cm LV IVS:        0.50 cm LVOT diam:     2.00 cm LVOT Area:     3.14 cm  LEFT ATRIUM         Index LA diam:    2.60 cm 1.73 cm/m                        PULMONIC VALVE AORTA                 PR End Diast Vel: 9.73 msec Ao Root diam: 2.57 cm   SHUNTS Systemic Diam: 2.00 cm Nelva Bush MD Electronically signed by Nelva Bush MD Signature Date/Time: 03/31/2022/3:31:18 PM    Final    CT Angio Chest Pulmonary Embolism (PE) W or WO Contrast  Result Date:  03/30/2022 CLINICAL DATA:  Pulmonary embolism suspected. EXAM: CT ANGIOGRAPHY CHEST AND ABDOMEN TECHNIQUE: Multidetector CT imaging of the chest and abdomen was performed using the standard protocol during bolus administration of intravenous contrast. Multiplanar CT image reconstructions and MIPs were obtained to evaluate the vascular anatomy. RADIATION DOSE REDUCTION: This exam was performed according to the departmental dose-optimization program which includes automated exposure control, adjustment of the mA and/or kV according to patient size and/or use of iterative reconstruction technique. CONTRAST:  49mL OMNIPAQUE IOHEXOL 350 MG/ML SOLN COMPARISON:  None Available. FINDINGS: CTA CHEST FINDINGS Cardiovascular: Satisfactory opacification of the pulmonary arteries to the segmental level. No evidence of pulmonary embolism.  Normal heart size. No pericardial effusion. Mediastinum/Nodes: No enlarged mediastinal, hilar, or axillary lymph nodes. Thyroid is unremarkable. Endotracheal tube with distal tip approximately 1.3 cm above the carina. Lungs/Pleura: Bibasilar dependent atelectasis. No focal consolidation or pleural effusion. Musculoskeletal: Mild irregularity of the sternal body and manubrium as well as cortical irregularity of bilateral ribs, it may be artifact due to significant respiratory motion or mildly displaced fractures in this patient with CPR. Review of the MIP images confirms the above findings. CTA ABDOMEN FINDINGS Aorta: Normal caliber aorta without aneurysm, dissection, vasculitis or significant stenosis. Celiac: Patent without evidence of aneurysm, dissection, vasculitis or significant stenosis. SMA: Patent without evidence of aneurysm, dissection, vasculitis or significant stenosis. Renals: Both renal arteries are patent without evidence of aneurysm, dissection, vasculitis, fibromuscular dysplasia or significant stenosis. IMA: Patent without evidence of aneurysm, dissection, vasculitis or  significant stenosis. Inflow: Patent without evidence of aneurysm, dissection, vasculitis or significant stenosis. Veins: No obvious venous abnormality within the limitations of this arterial phase study. Right femoral access catheter with distal tip in the internal iliac vein. Hepatobiliary: No acute hepatic abnormality. No biliary ductal dilatation. Gallbladder is distended without pericholecystic inflammatory changes. Pancreas: Pancreas is unremarkable. No pancreatic ductal dilatation. Spleen: Normal in size without evidence of acute trauma. Adrenals/Urinary Tract: Adrenals are unremarkable. Symmetric perfusion of bilateral kidneys. No evidence of nephrolithiasis or hydronephrosis. No ureteral calculus. Urinary bladder is distended with Foley's catheter in place and small amount of air, likely iatrogenic. Stomach/Bowel: Stomach is unremarkable. Bowel loops are normal in caliber. No evidence of colitis or diverticulitis. Lymphatic: No lymphadenopathy. Musculoskeletal: No acute osseous abnormality. Other: None Review of the MIP images confirms the above findings. IMPRESSION: CT angio chest: 1. No evidence of pulmonary embolism. 2. No acute vascular abnormality. 3. Bibasilar dependent atelectasis, without evidence of pleural effusion or pneumothorax. 4. Irregularity of the sternum as well as multiple bilateral ribs, evaluation is limited due to significant respiratory motion, it may be artifact due to motion or mildly displaced fractures in this patient with cardiopulmonary resuscitation. CTA abdomen/pelvis: 1.  No CT evidence of acute abdominal/pelvic process. 2. Bowel loops are normal in caliber. No evidence of obstruction, colitis or diverticulitis. 3. Gallbladder is distended with bile, without evidence of surrounding inflammatory changes. 4. Right femoral vein access central line with distal tip in the right internal iliac vein, repositioning could be considered if there is concern for the function of the  central line. Electronically Signed   By: Keane Police D.O.   On: 03/30/2022 21:35   CT ABDOMEN PELVIS W CONTRAST  Result Date: 03/30/2022 CLINICAL DATA:  Pulmonary embolism suspected. EXAM: CT ANGIOGRAPHY CHEST AND ABDOMEN TECHNIQUE: Multidetector CT imaging of the chest and abdomen was performed using the standard protocol during bolus administration of intravenous contrast. Multiplanar CT image reconstructions and MIPs were obtained to evaluate the vascular anatomy. RADIATION DOSE REDUCTION: This exam was performed according to the departmental dose-optimization program which includes automated exposure control, adjustment of the mA and/or kV according to patient size and/or use of iterative reconstruction technique. CONTRAST:  71mL OMNIPAQUE IOHEXOL 350 MG/ML SOLN COMPARISON:  None Available. FINDINGS: CTA CHEST FINDINGS Cardiovascular: Satisfactory opacification of the pulmonary arteries to the segmental level. No evidence of pulmonary embolism. Normal heart size. No pericardial effusion. Mediastinum/Nodes: No enlarged mediastinal, hilar, or axillary lymph nodes. Thyroid is unremarkable. Endotracheal tube with distal tip approximately 1.3 cm above the carina. Lungs/Pleura: Bibasilar dependent atelectasis. No focal consolidation or pleural effusion. Musculoskeletal: Mild irregularity of the sternal  body and manubrium as well as cortical irregularity of bilateral ribs, it may be artifact due to significant respiratory motion or mildly displaced fractures in this patient with CPR. Review of the MIP images confirms the above findings. CTA ABDOMEN FINDINGS Aorta: Normal caliber aorta without aneurysm, dissection, vasculitis or significant stenosis. Celiac: Patent without evidence of aneurysm, dissection, vasculitis or significant stenosis. SMA: Patent without evidence of aneurysm, dissection, vasculitis or significant stenosis. Renals: Both renal arteries are patent without evidence of aneurysm, dissection,  vasculitis, fibromuscular dysplasia or significant stenosis. IMA: Patent without evidence of aneurysm, dissection, vasculitis or significant stenosis. Inflow: Patent without evidence of aneurysm, dissection, vasculitis or significant stenosis. Veins: No obvious venous abnormality within the limitations of this arterial phase study. Right femoral access catheter with distal tip in the internal iliac vein. Hepatobiliary: No acute hepatic abnormality. No biliary ductal dilatation. Gallbladder is distended without pericholecystic inflammatory changes. Pancreas: Pancreas is unremarkable. No pancreatic ductal dilatation. Spleen: Normal in size without evidence of acute trauma. Adrenals/Urinary Tract: Adrenals are unremarkable. Symmetric perfusion of bilateral kidneys. No evidence of nephrolithiasis or hydronephrosis. No ureteral calculus. Urinary bladder is distended with Foley's catheter in place and small amount of air, likely iatrogenic. Stomach/Bowel: Stomach is unremarkable. Bowel loops are normal in caliber. No evidence of colitis or diverticulitis. Lymphatic: No lymphadenopathy. Musculoskeletal: No acute osseous abnormality. Other: None Review of the MIP images confirms the above findings. IMPRESSION: CT angio chest: 1. No evidence of pulmonary embolism. 2. No acute vascular abnormality. 3. Bibasilar dependent atelectasis, without evidence of pleural effusion or pneumothorax. 4. Irregularity of the sternum as well as multiple bilateral ribs, evaluation is limited due to significant respiratory motion, it may be artifact due to motion or mildly displaced fractures in this patient with cardiopulmonary resuscitation. CTA abdomen/pelvis: 1.  No CT evidence of acute abdominal/pelvic process. 2. Bowel loops are normal in caliber. No evidence of obstruction, colitis or diverticulitis. 3. Gallbladder is distended with bile, without evidence of surrounding inflammatory changes. 4. Right femoral vein access central line with  distal tip in the right internal iliac vein, repositioning could be considered if there is concern for the function of the central line. Electronically Signed   By: Larose Hires D.O.   On: 03/30/2022 21:35   CT HEAD WO CONTRAST ( )  Result Date: 03/30/2022 CLINICAL DATA:  Neuro deficit, acute stroke suspected.  Post CPR. EXAM: CT HEAD WITHOUT CONTRAST TECHNIQUE: Contiguous axial images were obtained from the base of the skull through the vertex without intravenous contrast. RADIATION DOSE REDUCTION: This exam was performed according to the departmental dose-optimization program which includes automated exposure control, adjustment of the mA and/or kV according to patient size and/or use of iterative reconstruction technique. COMPARISON:  None Available. FINDINGS: Brain: No evidence of acute infarction, hemorrhage, hydrocephalus, extra-axial collection or mass lesion/mass effect. Mild cerebral volume loss advanced for age. Vascular: No hyperdense vessel or unexpected calcification. Skull: Normal. Negative for fracture or focal lesion. Sinuses/Orbits: Patchy opacification of the ethmoid air cells. Remaining paranasal sinuses are clear. Other: None. IMPRESSION: 1.  No acute intracranial abnormality. 2.  Mild cerebral volume loss, advanced for age. 3.  Mild paranasal sinus disease. Electronically Signed   By: Larose Hires D.O.   On: 03/30/2022 21:09   DG Chest Portable 1 View  Result Date: 03/30/2022 CLINICAL DATA:  Cardiac arrest EXAM: PORTABLE CHEST 1 VIEW COMPARISON:  Previous studies including the examination of 02/23/2021 FINDINGS: Transverse diameter of heart is slightly increased. Tip of endotracheal tube  is 4 cm above the carina. Distal portion of enteric tube is seen in the stomach. There are patchy densities in left upper lung field and left lower lung field. No focal abnormalities are seen in right lung. Costophrenic angles are clear. There is no pneumothorax. IMPRESSION: There are patchy densities  in left upper and left lower lung fields suggesting atelectasis/pneumonia. Electronically Signed   By: Elmer Picker M.D.   On: 03/30/2022 19:16    Cardiac Studies     Patient Profile     Yvette [name] is a 41 y.o. 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, Kawasaki disease x2 during childhood, asthma, anemia, alcohol abuse, and tobacco abuse who is being seen 03/31/2022 for the evaluation of cardiac arrest  Assessment & Plan    Ventricular fibrillation and cardiac arrest: -- History of ventricular fibrillation exacerbated by electrolyte abnormality/alcohol abuse in the past Witnessed cardiac arrest in the emergency room, not on telemetry at the time Initial rhythm was ventricular fibrillation, treated with defibrillation and IV amiodarone --Magnesium low 1.2, potassium low Both have been replenished -- Would recommend avoiding QT prolonging medications including antiemetics, aggressive electrolyte repletion, alcohol cessation The above was discussed with patient and patient's parents at the bedside  Dilated cardiomyopathy Ejection fraction down to 30 to 40% on echo Decreased from prior study December 2022 EF at that time 16 to 55% Likely in the setting of stress/cardiac arrest/defibrillation for VF, VT Underlying COVID No plan for ischemic work-up at this time --Will increase carvedilol up to 6.25 twice daily Goal-directed medical therapy as blood pressure tolerates   COVID-19: No respiratory distress, family denies sick contacts  Polysubstance abuse: ETOH cessation  Total encounter time more than 30 minutes Greater than 50% was spent in counseling and coordination of care with the patient    For questions or updates, please contact [place] Please consult [email or web address] for contact info under        Signed, Ida Rogue, MD  04/01/2022, 2:53 PM

## 2022-04-01 NOTE — Progress Notes (Signed)
PT Cancellation Note  Patient Details Name: Yvette [name] MRN: [id number] DOB: August 17, 1980   Cancelled Treatment:    Reason Eval/Treat Not Completed: Other (comment). Discussed with care team. No PT needs identified at this time. Pt up ad lib in room with independence. Will sign off.   Aletha Allebach 04/01/2022, 11:08 AM Greggory Stallion, PT, DPT, [facility] [phone]

## 2022-04-01 NOTE — Hospital Course (Addendum)
Patient is a 41 year old female with history of Kawasaki syndrome, alcohol abuse and alcoholic liver cirrhosis who present to the hospital with nausea vomiting.  She was found to have possible COVID.  She was giving a dose of droperidol.  She subsequently developed cardiac arrest with ventricular fibrillation, then ventricular tachycardia.  She was cardioverted.  She was intubated for acute respiratory failure. Her condition has improved, she was transferred to the medical floor on 9/20.  Patient was transferred back to ICU on 9/22 due to severe agitation requiring Precedex for alcohol withdrawal.  She also had a low blood pressure requiring pressor. Transferred back to medical floor on 9/24.

## 2022-04-01 NOTE — Progress Notes (Addendum)
  Progress Note   Patient: Yvette [name] DOB: Apr 08, 1981 DOA: 03/30/2022     2 DOS: the patient was seen and examined on 04/01/2022   Brief hospital course: Patient is a 41 year old female with history of Kawasaki syndrome, alcohol abuse and alcoholic liver cirrhosis who present to the hospital with nausea vomiting.  She was found to have possible COVID.  She was giving a dose of droperidol.  She subsequently developed cardiac arrest with ventricular fibrillation, then ventricular tachycardia.  She was cardioverted.  She was intubated for acute respiratory failure. Her condition has improved, she was transferred to the medical floor on 9/20.  Assessment and Plan: Cardiac arrest with ventricular fibrillation. Long QT interval. Severe hypomagnesemia  Severe hypokalemia. Severe hypophosphatemia. Chronic systolic congestive heart failure. Myocardial injury secondary to cardiac arrest. Patient condition has improved.  Has been seen by cardiology, etiology of cardiac arrest appears to be due to CVA electrolytes abnormality in the setting of a long QT interval.  Droperidol may further prolong QT interval, triggered cardiac arrest.  She also had a positive COVID. Echocardiogram showed ejection fraction 35 to 40%.  We followed by cardiology.  Troponin 101, appears to be due to cardiac arrest instead of true coronary disease. Patient still has significant sinus tachycardia, blood pressure stable, will start beta-blocker.  Acute hypoxemic respiratory failure secondary to cardiac arrest. Condition has improved.  Alcohol abuse. Alcoholic liver cirrhosis. Advised to quit, continue CIWA protocol.   Covid infection   Will complete a course of Molnupiravir.    Subjective:  Patient much improved, currently denies any chest pain shortness of breath.  No agitation or tremor.  Physical Exam: Vitals:   04/01/22 0900 04/01/22 1000 04/01/22 1100 04/01/22 1200  BP: (!) 122/93 (!) 145/103  (!) 123/99 (!) 119/103  Pulse: (!) 107   (!) 116  Resp: 19 (!) 23 (!) 28 11  Temp:    98.7 F (37.1 C)  TempSrc:    Oral  SpO2: 95%   100%  Weight:      Height:       General exam: Appears calm and comfortable  Respiratory system: Clear to auscultation. Respiratory effort normal. Cardiovascular system: S1 & S2 heard, RRR. No JVD, murmurs, rubs, gallops or clicks. No pedal edema. Gastrointestinal system: Abdomen is nondistended, soft and nontender. No organomegaly or masses felt. Normal bowel sounds heard. Central nervous system: Alert and oriented x2. No focal neurological deficits. Extremities: Symmetric 5 x 5 power. Skin: No rashes, lesions or ulcers Psychiatry: Judgement and insight appear normal. Mood & affect appropriate.   Data Reviewed:  Reviewed all lab results.  Family Communication:   Disposition: Status is: Inpatient Remains inpatient appropriate because: Severity of disease.  Planned Discharge Destination: Home    Time spent: 55 minutes  Author: Sharen Hones, MD 04/01/2022 1:11 PM  For on call review [email or web address].

## 2022-04-02 ENCOUNTER — Inpatient Hospital Stay: Payer: Medicaid Other

## 2022-04-02 DIAGNOSIS — J9601 Acute respiratory failure with hypoxia: Secondary | ICD-10-CM | POA: Diagnosis not present

## 2022-04-02 DIAGNOSIS — I469 Cardiac arrest, cause unspecified: Secondary | ICD-10-CM | POA: Diagnosis not present

## 2022-04-02 DIAGNOSIS — K7030 Alcoholic cirrhosis of liver without ascites: Secondary | ICD-10-CM | POA: Diagnosis not present

## 2022-04-02 DIAGNOSIS — I420 Dilated cardiomyopathy: Secondary | ICD-10-CM

## 2022-04-02 DIAGNOSIS — I4901 Ventricular fibrillation: Principal | ICD-10-CM

## 2022-04-02 DIAGNOSIS — F1010 Alcohol abuse, uncomplicated: Secondary | ICD-10-CM | POA: Diagnosis not present

## 2022-04-02 LAB — GLUCOSE, CAPILLARY
Glucose-Capillary: 101 mg/dL — ABNORMAL HIGH (ref 70–99)
Glucose-Capillary: 116 mg/dL — ABNORMAL HIGH (ref 70–99)
Glucose-Capillary: 131 mg/dL — ABNORMAL HIGH (ref 70–99)
Glucose-Capillary: 63 mg/dL — ABNORMAL LOW (ref 70–99)
Glucose-Capillary: 73 mg/dL (ref 70–99)
Glucose-Capillary: 87 mg/dL (ref 70–99)

## 2022-04-02 LAB — COMPREHENSIVE METABOLIC PANEL
ALT: 53 U/L — ABNORMAL HIGH (ref 0–44)
AST: 64 U/L — ABNORMAL HIGH (ref 15–41)
Albumin: 3.2 g/dL — ABNORMAL LOW (ref 3.5–5.0)
Alkaline Phosphatase: 86 U/L (ref 38–126)
Anion gap: 7 (ref 5–15)
BUN: 8 mg/dL (ref 6–20)
CO2: 26 mmol/L (ref 22–32)
Calcium: 8.8 mg/dL — ABNORMAL LOW (ref 8.9–10.3)
Chloride: 108 mmol/L (ref 98–111)
Creatinine, Ser: 0.66 mg/dL (ref 0.44–1.00)
GFR, Estimated: >60 mL/min (ref >60)
Glucose, Bld: 124 mg/dL — ABNORMAL HIGH (ref 70–99)
Potassium: 5 mmol/L (ref 3.5–5.1)
Sodium: 141 mmol/L (ref 135–145)
Total Bilirubin: 0.5 mg/dL (ref 0.3–1.2)
Total Protein: 6.9 g/dL (ref 6.5–8.1)

## 2022-04-02 LAB — BASIC METABOLIC PANEL
Anion gap: 8 (ref 5–15)
BUN: 8 mg/dL (ref 6–20)
CO2: 24 mmol/L (ref 22–32)
Calcium: 8.8 mg/dL — ABNORMAL LOW (ref 8.9–10.3)
Chloride: 109 mmol/L (ref 98–111)
Creatinine, Ser: 0.5 mg/dL (ref 0.44–1.00)
GFR, Estimated: >60 mL/min (ref >60)
Glucose, Bld: 100 mg/dL — ABNORMAL HIGH (ref 70–99)
Potassium: 4.5 mmol/L (ref 3.5–5.1)
Sodium: 141 mmol/L (ref 135–145)

## 2022-04-02 LAB — CBC WITH DIFFERENTIAL/PLATELET
Abs Immature Granulocytes: 0.01 10*3/uL (ref 0.00–0.07)
Basophils Absolute: 0 10*3/uL (ref 0.0–0.1)
Basophils Relative: 0 %
Eosinophils Absolute: 0.1 10*3/uL (ref 0.0–0.5)
Eosinophils Relative: 2 %
HCT: 34.1 % — ABNORMAL LOW (ref 36.0–46.0)
Hemoglobin: 10.9 g/dL — ABNORMAL LOW (ref 12.0–15.0)
Immature Granulocytes: 0 %
Lymphocytes Relative: 16 %
Lymphs Abs: 0.6 10*3/uL — ABNORMAL LOW (ref 0.7–4.0)
MCH: 26.3 pg (ref 26.0–34.0)
MCHC: 32 g/dL (ref 30.0–36.0)
MCV: 82.4 fL (ref 80.0–100.0)
Monocytes Absolute: 0.5 10*3/uL (ref 0.1–1.0)
Monocytes Relative: 13 %
Neutro Abs: 2.3 10*3/uL (ref 1.7–7.7)
Neutrophils Relative %: 69 %
Platelets: 116 10*3/uL — ABNORMAL LOW (ref 150–400)
RBC: 4.14 MIL/uL (ref 3.87–5.11)
RDW: 16.5 % — ABNORMAL HIGH (ref 11.5–15.5)
WBC: 3.4 10*3/uL — ABNORMAL LOW (ref 4.0–10.5)
nRBC: 0 % (ref 0.0–0.2)

## 2022-04-02 LAB — FERRITIN: Ferritin: 25 ng/mL (ref 11–307)

## 2022-04-02 LAB — D-DIMER, QUANTITATIVE: D-Dimer, Quant: 2.31 ug/mL-FEU — ABNORMAL HIGH (ref 0.00–0.50)

## 2022-04-02 LAB — PHOSPHORUS: Phosphorus: 4.2 mg/dL (ref 2.5–4.6)

## 2022-04-02 LAB — MAGNESIUM: Magnesium: 2 mg/dL (ref 1.7–2.4)

## 2022-04-02 LAB — C-REACTIVE PROTEIN: CRP: 8.3 mg/dL — ABNORMAL HIGH (ref <1.0)

## 2022-04-02 MED ORDER — STERILE WATER FOR INJECTION IJ SOLN
INTRAMUSCULAR | Status: AC
  Filled 2022-04-02: qty 10

## 2022-04-02 MED ORDER — MIDAZOLAM HCL 2 MG/2ML IJ SOLN
2.0000 mg | Freq: Once | INTRAMUSCULAR | Status: AC
  Administered 2022-04-02: 2 mg via INTRAVENOUS
  Filled 2022-04-02: qty 2

## 2022-04-02 MED ORDER — LORAZEPAM 2 MG/ML IJ SOLN
1.0000 mg | INTRAMUSCULAR | Status: DC | PRN
  Administered 2022-04-02 – 2022-04-05 (×10): 1 mg via INTRAVENOUS
  Filled 2022-04-02 (×10): qty 1

## 2022-04-02 MED ORDER — CALCIUM GLUCONATE-NACL 1-0.675 GM/50ML-% IV SOLN
1.0000 g | Freq: Once | INTRAVENOUS | Status: AC
  Administered 2022-04-02: 1000 mg via INTRAVENOUS
  Filled 2022-04-02: qty 50

## 2022-04-02 MED ORDER — ALTEPLASE 2 MG IJ SOLR
INTRAMUSCULAR | Status: AC
  Administered 2022-04-02: 2 mg
  Filled 2022-04-02: qty 2

## 2022-04-02 MED ORDER — THIAMINE HCL 100 MG/ML IJ SOLN
500.0000 mg | Freq: Three times a day (TID) | INTRAVENOUS | Status: DC
  Administered 2022-04-02 – 2022-04-03 (×4): 500 mg via INTRAVENOUS
  Filled 2022-04-02 (×6): qty 5

## 2022-04-02 MED ORDER — NOREPINEPHRINE 4 MG/250ML-% IV SOLN
INTRAVENOUS | Status: AC
  Filled 2022-04-02: qty 250

## 2022-04-02 MED ORDER — THIAMINE HCL 100 MG/ML IJ SOLN: 100.0000 mg | Freq: Every day | INTRAMUSCULAR | Status: DC

## 2022-04-02 MED ORDER — THIAMINE HCL 100 MG/ML IJ SOLN: 250.0000 mg | INTRAVENOUS | Status: DC

## 2022-04-02 MED ORDER — DIAZEPAM 5 MG/ML IJ SOLN
2.5000 mg | Freq: Four times a day (QID) | INTRAMUSCULAR | Status: DC
  Administered 2022-04-02 – 2022-04-06 (×17): 2.5 mg via INTRAVENOUS
  Filled 2022-04-02 (×17): qty 2

## 2022-04-02 MED ORDER — NOREPINEPHRINE 4 MG/250ML-% IV SOLN
2.0000 ug/min | INTRAVENOUS | Status: DC
  Administered 2022-04-02: 2 ug/min via INTRAVENOUS

## 2022-04-02 MED ORDER — SODIUM CHLORIDE 0.9 % IV SOLN
INTRAVENOUS | Status: AC
  Filled 2022-04-02 (×2): qty 1000

## 2022-04-02 MED ORDER — DEXMEDETOMIDINE HCL IN NACL 400 MCG/100ML IV SOLN
0.4000 ug/kg/h | INTRAVENOUS | Status: DC
  Filled 2022-04-02: qty 100

## 2022-04-02 MED ORDER — DIPHENHYDRAMINE HCL 50 MG/ML IJ SOLN
25.0000 mg | Freq: Once | INTRAMUSCULAR | Status: AC
  Administered 2022-04-02: 25 mg via INTRAVENOUS
  Filled 2022-04-02: qty 1

## 2022-04-02 MED ORDER — SODIUM CHLORIDE 0.9 % IV SOLN
250.0000 mL | INTRAVENOUS | Status: DC
  Administered 2022-04-02 – 2022-04-05 (×2): 250 mL via INTRAVENOUS

## 2022-04-02 MED ORDER — DEXMEDETOMIDINE HCL IN NACL 400 MCG/100ML IV SOLN
0.4000 ug/kg/h | INTRAVENOUS | Status: DC
  Administered 2022-04-02: 0.4 ug/kg/h via INTRAVENOUS
  Filled 2022-04-02: qty 100

## 2022-04-02 MED ORDER — DEXTROSE 50 % IV SOLN
INTRAVENOUS | Status: AC
  Administered 2022-04-02: 50 mL
  Filled 2022-04-02: qty 50

## 2022-04-02 MED ORDER — ATROPINE SULFATE 1 MG/10ML IJ SOSY
PREFILLED_SYRINGE | INTRAMUSCULAR | Status: AC
  Filled 2022-04-02: qty 10

## 2022-04-02 NOTE — Consult Note (Signed)
NAME:  DESTYN WADDY, MRN:  [id number], DOB:  September 24, 1980, LOS: 3 ADMISSION DATE:  03/30/2022, CONSULTATION DATE:  04/02/2022 REFERRING MD:  Dr. Roosevelt Locks, CHIEF COMPLAINT:  Severe DT's   Brief Pt Description / Synopsis:  41 yo AAF with end stage liver cirrhosis admitted to ICU for acute V. Fib Cardiac arrest, suspect secondary to R-on-T PVC in the setting of prolonged Qtc & severe hypomagnesemia/Hypokalemia, along with COVID 19 infection.  Course complicated by severe DT's.  History of Present Illness:  41 yo F presenting to [facility] ED from home via EMS on 03/30/22 with complaints of nausea/vomiting since midnight 9/19. History obtained via ED documentation, supplemented by father Carrianne Cutting via phone interview. She reported in triage that she had taken medication without relief and was still vomiting clear liquid. She denied abdominal pain. Her father reported that she had complained of a cold symptoms over the last few days, otherwise was in her normal state of health. She told ED staff that her last drink was Sunday day.   Per chart review the patient has had multiple admissions with gastritis and Transaminitis during which time she develops refractory nausea & vomiting with hypokalemia & hypomagnesemia. She also has a previous history of polymorphic ventricular tachycardia. In June 2023 she reported cutting back on her alcohol consumption to 1-2 16 oz beers daily.   ED course: Upon arrival patient afebrile with stable vital signs complaining of nausea and vomiting but no pain.  While being observed in the ED where she was ambulatory the patient refused to wear monitoring equipment requesting nausea medication.  After she received droperidol she subsequently became agonal which was noticed immediately by her nurse bedside.  Patient respiratory arrested requiring bag-valve-mask support.  No palpable pulse and CPR was started with the first recognized rhythm as V-fib.  The patient was defibrillated with  ACLS medications per protocol provided.  Next pulse check showed V. tach with a palpable pulse heart rate 200 and BP 100/50.  300 mg amiodarone was given and she was synchronized cardioverted to sinus tach.  The patient was then emergently intubated and placed on mechanical ventilatory support.  COVID-19: positive   ABG: 7.14/ 55/ 122/ 18.7 CXR 03/30/22: there are patchy densities in the LUL & LLL suggesting atelectasis vs pneumonia CT head wo contrast 03/30/22: no acute intracranial abnormality. Mild cerebral volume loss, advanced for age. Mild paranasal sinus disease. CT angio chest PE w or wo contrast 03/30/22: no evidence of PE, no acute vascular abnormality. Bibasilar dependent atelectasis. Irregularity of the sternum as well as multiple bilateral broken ribs. CT abdomen/pelvis w contrast 03/30/22: no acute process. Gallbladder distended with bile.   PCCM consulted for admission due to acute respiratory arrest leading to cardiac arrest and mechanical intubation requiring ventilatory support.  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 Noted during admission 06/2021 assoc w/ palpitations and presyncope.   Tobacco abuse    Transaminitis     Micro Data:  9/19: SARS-CoV-2 PCR>> positive 9/19: MRSA PCR>> negative 9/22: Blood culture x2>>  Antimicrobials:  Unasyn 9/20>> 9/20  Significant Hospital Events: Including procedures, antibiotic start and stop dates in addition to other pertinent events   03/30/22: Admit to ICU post acute respiratory arrest leading to cardiac arrest and mechanical intubation requiring ventilatory support. (9 minutes downtime, received 1 defibrillation & 1  synchronized  cardioversion. Normothermia protocol implemented. 03/31/22: Cardiology consulted. EXTUBATED. 04/01/22: TR[facility] assumed care.  Overnight developed severe DT's requiring Precedex gtt. 04/02/22: Ongoing severe DT's.  Bradycardia with precedex, transient episode of hypotension briefly requiring Levophed.  PCCM consulted.  Precedex held, placed on scheduled Valium.  Interim History / Subjective:  -Overnight developed severe agitation and violent towards staff ~was placed on Precedex drip but ultimately required four-point restraints -Had episode of hypoxia while refusing oxygen, transient episode of hypotension briefly requiring Levophed -Bradycardia with Precedex, Precedex held ~placed on scheduled Valium every 6 hours with as needed Ativan  Objective   Blood pressure 98/73, pulse 73, temperature 98.5 F (36.9 C), temperature source Oral, resp. rate 20, height 5\' 2"  (1.575 m), weight 48.7 kg, SpO2 100 %.        Intake/Output Summary (Last 24 hours) at 04/02/2022 1120 Last data filed at 04/02/2022 [id number] Gross per 24 hour  Intake 528.68 ml  Output 675 ml  Net -146.32 ml   Filed Weights   03/31/22 0500 04/01/22 0500 04/02/22 [id number]  Weight: 51.2 kg 51.2 kg 48.7 kg    Examination: General: Acute on chronically ill-appearing female, sedated on Precedex, on nasal cannula, no acute distress HENT: Atraumatic, normocephalic, neck supple, no JVD Lungs: Clear diminished breath sounds bilaterally, even, nonlabored Cardiovascular: Tachycardia, regular rhythm, S1-S2, no murmurs, rubs, gallops Abdomen: Soft, nontender, nondistended, no guarding rebound tenderness, bowel sounds positive x4 Extremities: Normal bulk and tone, no deformities, no edema Neuro: Heavily sedated on Precedex, unable to follow commands, pupils PERRLA GU: Deferred  Resolved Hospital Problem list     Assessment & Plan:   #Acute Metabolic Encephalopathy #Severe DT's PMHx: Polysubstance abuse -Provide supportive  care -Seizure and aspiration precautions -CIWA Protocol -Scheduled and prn benzo's -Precedex if needed -Thiamine, MVI, and folic acid  #Acute Hypoxic Respiratory Failure in setting of cardiac arrest, COVID-19 Pneumonia, Query Aspiration EXTUBATED 9/20 -Supplemental O2 as needed to maintain O2 sats 90% -Currently protecting airway, but high risk for reintubation -Follow intermittent Chest X-ray & ABG as needed -Bronchodilators & Pulmicort nebs -IV Steroids -ABX as above -Pulmonary toilet as able  #V. Fib cardiac arrest, suspect due to prolonged QTc along with multiple metabolic derangements #Dilated Cardiomyopathy -Continuous cardiac monitoring -Maintain MAP >65 -Vasopressors as needed to maintain MAP goal -Trend HS Troponin until peaked -Echocardiogram 03/31/22: LVEF 30-40% -Cardiology following, appreciate input -Avoid QT prolonging medications -Aggressive electrolyte repletion  #Mild Leukopenia #COVID-19 Infection #Query aspiration -Monitor fever curve -Trend WBC's and inflammatory markers  -Follow cultures as above -ABX discontinued as PCT negative x3 -To complete course of Molnupiravir -Vitamin C and Zinc -Maintain airborne and contact precautions  #Hypokalemia ~ RESOLVED #Hypomagnesemia ~ RESOLVED #Hypophosphatemia ~ RESOLVED -Monitor I&O's / urinary output -Follow BMP -Ensure adequate renal perfusion -Avoid nephrotoxic agents as able -Replace electrolytes as indicated -Pharmacy following for assistance with electrolyte replacement  #Alcohol Cirrhosis -Trend LFT's and coags -Encourage ETOH cessation     Patient is critically ill and course complicated by severe DTs.  High risk for decompensation requiring intubation, cardiac arrest and death.  Prognosis is guarded.    Best Practice (right click and "Reselect all SmartList Selections" daily)   Diet/type: NPO DVT prophylaxis: LMWH GI prophylaxis: N/A Lines: Central  line and yes and it is still  needed Foley:  N/A Code Status:  full code Last date of multidisciplinary goals of care discussion [04/02/22]  Labs   CBC: Recent Labs  Lab 03/30/22 1242 03/30/22 2154 04/02/22 1056  WBC 3.6* 6.1 3.4*  NEUTROABS  --  5.4 2.3  HGB 11.8* 9.8* 10.9*  HCT 36.6 30.1* 34.1*  MCV 80.8 82.0 82.4  PLT 137* 105* 116*    Basic Metabolic Panel: Recent Labs  Lab 03/30/22 1242 03/30/22 1246 03/30/22 2154 03/31/22 0209 03/31/22 1455 04/01/22 0804  NA 139  --  140 142  --  140  K 3.4*  --  3.4* 3.3* 3.6 3.5  CL 100  --  106 108  --  102  CO2 22  --  19* 24  --  25  GLUCOSE 150*  --  170* 121*  --  84  BUN 8  --  7 7  --  5*  CREATININE 0.81  --  0.87 0.85  --  0.68  CALCIUM 9.3  --  7.3* 7.6*  --  8.3*  MG  --  1.3* 1.2* 4.7* 2.5* 1.9  PHOS  --  2.8 4.1 1.4* 5.0* 2.2*   GFR: Estimated Creatinine Clearance: 73.3 mL/min (by C-G formula based on SCr of 0.68 mg/dL). Recent Labs  Lab 03/30/22 1242 03/30/22 1246 03/30/22 2021 03/30/22 2154 03/31/22 0209 04/01/22 0804 04/02/22 1056  PROCALCITON  --  <0.10  --   --  <0.10 <0.10  --   WBC 3.6*  --   --  6.1  --   --  3.4*  LATICACIDVEN  --   --  1.9 3.6* 1.7  --   --     Liver Function Tests: Recent Labs  Lab 03/30/22 1242 03/30/22 2154  AST 229* 424*  ALT 80* 96*  ALKPHOS 109 80  BILITOT 0.8 0.8  PROT 9.1* 6.7  ALBUMIN 4.9 3.4*   Recent Labs  Lab 03/30/22 1242  LIPASE 22   No results for input(s): "AMMONIA" in the last 168 hours.  ABG    Component Value Date/Time   PHART 7.52 (H) 03/31/2022 0511   PCO2ART 28 (L) 03/31/2022 0511   PO2ART 99 03/31/2022 0511   HCO3 22.9 03/31/2022 0511   ACIDBASEDEF 1.4 03/30/2022 2345   O2SAT 98 03/31/2022 0511     Coagulation Profile: Recent Labs  Lab 03/30/22 2021 03/30/22 2154  INR 1.2 1.2    Cardiac Enzymes: No results for input(s): "CKTOTAL", "CKMB", "CKMBINDEX", "TROPONINI" in the last 168 hours.  HbA1C: Hgb A1c MFr Bld  Date/Time Value Ref Range Status   04/01/2022 07:47 AM 5.6 4.8 - 5.6 % Final    Comment:    (NOTE) Pre diabetes:          5.7%-6.4%  Diabetes:              >6.4%  Glycemic control for   <7.0% adults with diabetes   03/30/2022 12:30 PM 12.5 (H) 4.8 - 5.6 % Final    Comment:    (NOTE) Pre diabetes:          5.7%-6.4%  Diabetes:              >6.4%  Glycemic control for   <7.0% adults with diabetes     CBG: Recent Labs  Lab 04/01/22 1149 04/01/22 1629 04/01/22 2054 04/02/22 0405 04/02/22 0820  GLUCAP 88 94 82 116* 101*    Review of Systems:   Unable  to assess due to AMS/sedation/critical illness   Past Medical History:  She,  has a past medical history of Asthma, ETOH abuse, H/O Kawasaki's disease, History of anemia, History of blood transfusion (1984), Hypokalemia, Hypomagnesemia, Leukopenia, Neutropenia ([place]), NSVT (nonsustained ventricular tachycardia) ([place]), Tobacco abuse, and Transaminitis.   Surgical History:   Past Surgical History:  Procedure Laterality Date   ESOPHAGOGASTRODUODENOSCOPY N/A 10/29/2020   Procedure: ESOPHAGOGASTRODUODENOSCOPY (EGD);  Surgeon: Jonathon Bellows, MD;  Location: [facility] ENDOSCOPY;  Service: Gastroenterology;  [name];   ESOPHAGOGASTRODUODENOSCOPY (EGD) WITH PROPOFOL N/A 03/14/2020   Procedure: ESOPHAGOGASTRODUODENOSCOPY (EGD) WITH PROPOFOL;  Surgeon: Virgel Manifold, MD;  [name];  Service: Endoscopy;  [name];   ESOPHAGOGASTRODUODENOSCOPY (EGD) WITH PROPOFOL N/A 02/25/2021   Procedure: ESOPHAGOGASTRODUODENOSCOPY (EGD) WITH PROPOFOL;  Surgeon: Virgel Manifold, MD;  [name];  Service: Endoscopy;  [name];     Social History:   reports that she has been smoking cigarettes. She has a 2.50 pack-year smoking history. She has never used smokeless tobacco. She reports current alcohol use of about 61.0 standard drinks of alcohol per week. She reports that she does not use drugs.   Family History:  Her family  history includes Cancer in her paternal aunt and paternal aunt; Lupus in her mother; Prostate cancer in her father.   Allergies No Known Allergies   Home Medications  Prior to Admission medications   Medication Sig Start Date End Date Taking? Authorizing Provider  DEPO-PROVERA 150 MG/ML injection Inject 150 mg into the muscle every 3 (three) months. 03/22/22  Yes [provider]  DIFLUCAN 150 MG tablet Take by mouth. 03/22/22  Yes [provider]  mirtazapine (REMERON) 15 MG tablet Take 15 mg by mouth at bedtime. 08/12/21  Yes [provider]  pantoprazole (PROTONIX) 40 MG tablet Take 40 mg by mouth daily. 08/12/21  Yes [provider]  naltrexone (DEPADE) 50 MG tablet Take 50 mg by mouth daily. 03/22/22   [provider]  ondansetron (ZOFRAN-ODT) 8 MG disintegrating tablet Take 1 tablet (8 mg total) by mouth every 8 (eight) hours as needed for nausea or vomiting. Patient not taking: Reported on 03/30/2022 01/10/22   Naaman Plummer, MD  promethazine (PHENERGAN) 25 MG suppository Place 1 suppository (25 mg total) rectally every 6 (six) hours as needed for nausea or vomiting. Patient not taking: Reported on 03/30/2022 01/10/22   Nance Pear, MD     Critical care time: 55 minutes     Darel Hong, AGACNP-BC [facility] Prefer epic messenger for cross cover needs If after hours, please call E-link

## 2022-04-02 NOTE — Progress Notes (Signed)
Initial Nutrition Assessment  DOCUMENTATION CODES:   Not applicable  INTERVENTION:   Once NGT in place, recommend:  Osmolite 1.5@55ml /hr- Initiate at 56ml/hr and increase by 96ml/hr q 8 hours until goal rate is reached.   Free water flushes 14ml q4 hours to maintain tube patency   Regimen provides 1980kcal/day, 83g/day protein and 1117ml/day of free water.   Pt at high refeed risk; recommend monitor potassium, magnesium and phosphorus labs daily until stable  NUTRITION DIAGNOSIS:   Increased nutrient needs related to catabolic illness (COVID 19, cirrhosis) as evidenced by estimated needs.  GOAL:   Patient will meet greater than or equal to 90% of their needs  MONITOR:   Labs, Weight trends, TF tolerance, Skin, I & O's  REASON FOR ASSESSMENT:   Rounds    ASSESSMENT:   40 y/o female with h/o Kawasaki's disease, etoh abuse and cirrhosis who is admitted with COVID 19 complicated by cardiac arrest with ventricular fibrillation and alcohol withdrawal.  Visited pt's room today. Pt is unable to provide any nutrition related history. Per chart review, pt with nausea and vomiting pta r/t COVID 19. Pt has not had any vomiting in the hospital. Pt initiated on a regular diet 9/20. Pt with poor oral intake in hospital. Pt has now developed agitation r/t etoh withdrawal and is requiring sedation. Plan is for NGT placement and nutrition support once pt's agitation improves enough for tube placement. Pt with h/o etoh abuse and is at refeed risk. Pt receiving high dose IV thiamine. Per chart, pt is down ~14lbs since admission and is now down ~5lbs from her UBW.   Medications reviewed and include: vitamin C, lovenox, folic acid, insulin, MVI, nicotine, thiamine, zinc  Labs reviewed: K 5.0 wnl, AST 64(H), ALT 53(H) WBC- 3.4(L), Hgb 10.9(L), Hct 34.1(L) Cbgs- 101, 116 x 24 hrs  NUTRITION - FOCUSED PHYSICAL EXAM:  Flowsheet Row Most Recent Value  Orbital Region No depletion  Upper Arm  Region No depletion  Thoracic and Lumbar Region No depletion  Buccal Region No depletion  Temple Region No depletion  Clavicle Bone Region No depletion  Clavicle and Acromion Bone Region No depletion  Scapular Bone Region No depletion  Dorsal Hand No depletion  Patellar Region No depletion  Anterior Thigh Region No depletion  Posterior Calf Region No depletion  Edema (RD Assessment) None  Hair Reviewed  Eyes Reviewed  Mouth Reviewed  Skin Reviewed  Nails Reviewed   Diet Order:   Diet Order             Diet regular Room service appropriate? Yes; Fluid consistency: Thin  Diet effective now                  EDUCATION NEEDS:   Not appropriate for education at this time  Skin:  Skin Assessment: Reviewed RN Assessment  Last BM:  9/21- type 6  Height:   Ht Readings from Last 1 Encounters:  03/30/22 5\' 2"  (1.575 m)    Weight:   Wt Readings from Last 1 Encounters:  04/02/22 48.7 kg    Ideal Body Weight:  50 kg  BMI:  Body mass index is 19.64 kg/m.  Estimated Nutritional Needs:   Kcal:  1700-1900kcal/day  Protein:  85-95g/day  Fluid:  1.5-1.7L/day  Koleen Distance MS, RD, LDN Please refer to [facility] for RD and/or RD on-call/weekend/after hours pager

## 2022-04-02 NOTE — Progress Notes (Addendum)
0420 Patient attempting to get out of bed, pull at lines,And pull at side rail following administration of PRN's. Verbal order for restraints order by Dr. Sidney Ace.  Patient put into soft 4 point restraint by staff. Sitter at bedside.

## 2022-04-02 NOTE — Progress Notes (Signed)
Patient still has unresolved confusion and agitation. RN and sitter unable to reorient patient at all. RASS +2 to +3. She continues to try to get out of the bed and it takes 3 people Investment banker, corporate, Actuary, additional CNA) to reposition back in the bed. Charge nurse notified for support.

## 2022-04-02 NOTE — Progress Notes (Signed)
Patient has had an eventful day, at approximately 0920 patient's heart rate went down to the high 40s and low 50s with oxygen saturation in the low 90s B/P 93/59 (66) by 0926 patient's oxygen saturation fluctuated between the mid 70s to 90s, attempted to place oxygen on the patient of which she pulled off.  Dr. Roosevelt Locks notified of patient status.  At 0930 patient became increasingly agitated and was able to pull arm up and grab this nurse with restraints on almost completely unbinding them. Patient at this point requiring more precidex as the patient is becoming physically unsafe.  At 1000 the patient's B/P 99/73 (82), HR 50, RR 16 oxygen saturation 100%, reached out to Dr. Roosevelt Locks regarding patient need for more medication intervention, but concerned about bradycardia, hypotension, and shallow breathing when administered. Dr. Roosevelt Locks at bedside examining patient and consulted with ICU care team, also gave order for Levophed if needed.  At approximately 1045 Levophed initiated, soon after patients heart rate dropped to the mid 30s and low 40s, ICU and cardiology at bedside, precidex stopped and atropine administered.  Patient was then changed to ICU level of care. Throughout the rest of the shift patient has intermittent moments of agitation and mild combativeness, continues with sitter, continues with restraints at this time due to combativeness and safety risk.  Patient had a low CBG of 63 on the evening check and was given dextrose amp, Dr. Lanney Gins aware and Banana bag at a rate of 68mL/hr time 2L bags to start at 2000.  Per NP Darlyn Chamber, hold off on NG tube placement at this time and allow patient to rest and re-evaluate day shift tomorrow.  Will endorse to oncoming shift.

## 2022-04-02 NOTE — Progress Notes (Signed)
Safety sitter continues at bedside

## 2022-04-02 NOTE — Plan of Care (Signed)
Continuing with plan of care. 

## 2022-04-02 NOTE — Progress Notes (Signed)
Patient continues to experience increased agitation. She is raising voice and cursing at Retail banker and attempted to kick primary RN. One time dose of benadryl and versed previously administered at 0220 without relief. EKG performed.

## 2022-04-02 NOTE — Progress Notes (Signed)
Patient developed severe agitation, became violent last night.  Was briefly placed on Precedex.  Currently she is on four-point restraint.  Oxygen saturation dropped down to 70%, and refused using oxygen. Discussed with Dr. Lanney Gins, patient will be transferred back to ICU. I also requested a psych evaluation for suicidal ideation.  This probably can be done when patient is more stable.

## 2022-04-02 NOTE — Consult Note (Signed)
Client is currently sedated, unable to assess.  Psych will continue to follow.  Waylan Boga, PMHNP

## 2022-04-02 NOTE — Progress Notes (Addendum)
Rounding Note    Patient Name: Yvette [name] Date of Encounter: 04/02/2022  [place] Cardiologist: Kathlyn Sacramento, MD   Subjective   Very agitated overnight, not responding to redirection from nursing Pulling on lines, no improvement in behavior with IV benzodiazepines Grabbing at nurses, climbing out of bed, pulling on restraints Treatment escalated to Precedex Coincided with Precedex initiation and running overnight, progressive bradycardia This morning obtunded, heart rates into the 30s, low but stable blood pressure Decision made to hold Precedex Atropine given for bradycardia, rates from the 30s improved up to 100  Inpatient Medications    Scheduled Meds:  alteplase       vitamin C  500 mg Oral Daily   Chlorhexidine Gluconate Cloth  6 each Topical Q0600   diazepam  2.5 mg Intravenous Q6H   enoxaparin (LOVENOX) injection  40 mg Subcutaneous K99I   folic acid  1 mg Oral Daily   insulin aspart  0-15 Units Subcutaneous Q4H   molnupiravir EUA  4 capsule Oral BID   multivitamin with minerals  1 tablet Oral Daily   nicotine  14 mg Transdermal Daily   sodium chloride flush  3 mL Intravenous Q12H   sterile water (preservative free)       [START ON 04/08/2022] thiamine (VITAMIN B1) injection  100 mg Intravenous Daily   zinc sulfate  220 mg Oral Daily   Continuous Infusions:  sodium chloride     sodium chloride 250 mL (04/02/22 1036)   dexmedetomidine (PRECEDEX) IV infusion 0.6 mcg/kg/hr (04/02/22 0600)   norepinephrine     norepinephrine (LEVOPHED) Adult infusion 2 mcg/min (04/02/22 1034)   thiamine (VITAMIN B1) injection     Followed by   Derrill Memo ON 04/06/2022] thiamine (VITAMIN B1) injection     PRN Meds: sodium chloride, alteplase, docusate sodium, LORazepam, norepinephrine, mouth rinse, mouth rinse, polyethylene glycol, sodium chloride flush, sterile water (preservative free)   Vital Signs    Vitals:   04/02/22 1042 04/02/22 1100 04/02/22 1130  04/02/22 1200  BP: (!) 131/94 (!) 148/121 (!) 132/107 (!) 135/105  Pulse:  (!) 109 96   Resp: 16 14 13 11   Temp:    97.7 F (36.5 C)  TempSrc:    Axillary  SpO2:  100% 97% 96%  Weight:      Height:        Intake/Output Summary (Last 24 hours) at 04/02/2022 1401 Last data filed at 04/02/2022 0658 Gross per 24 hour  Intake 528.68 ml  Output 675 ml  Net -146.32 ml      04/02/2022    6:20 AM 04/01/2022    5:00 AM 03/31/2022    5:00 AM  Last 3 Weights  Weight (lbs) 107 lb 5.8 oz 112 lb 14 oz 112 lb 14 oz  Weight (kg) 48.7 kg 51.2 kg 51.2 kg      Telemetry    Sinus bradycardia- Personally Reviewed  ECG     - Personally Reviewed  Physical Exam   GEN: Sedated Neck: No JVD Cardiac: RRR, no murmurs, rubs, or gallops.  Respiratory: Clear to auscultation bilaterally. GI: Soft, non-distended  MS: No edema; No deformity. Neuro: Unable to test Psych: Sedated  Labs    High Sensitivity Troponin:   Recent Labs  Lab 03/30/22 1242 03/30/22 2021 03/30/22 2154 03/31/22 0209 03/31/22 0608  TROPONINIHS 5 24* 62* 101* 90*     Chemistry Recent Labs  Lab 03/30/22 1242 03/30/22 1246 03/30/22 2154 03/31/22 0209 03/31/22 1455 04/01/22 0804 04/02/22  1012 04/02/22 1056  NA 139  --  140   < >  --  140 141 141  K 3.4*  --  3.4*   < > 3.6 3.5 4.5 5.0  CL 100  --  106   < >  --  102 109 108  CO2 22  --  19*   < >  --  25 24 26   GLUCOSE 150*  --  170*   < >  --  84 100* 124*  BUN 8  --  7   < >  --  5* 8 8  CREATININE 0.81  --  0.87   < >  --  0.68 0.50 0.66  CALCIUM 9.3  --  7.3*   < >  --  8.3* 8.8* 8.8*  MG  --    < > 1.2*   < > 2.5* 1.9 2.0  --   PROT 9.1*  --  6.7  --   --   --   --  6.9  ALBUMIN 4.9  --  3.4*  --   --   --   --  3.2*  AST 229*  --  424*  --   --   --   --  64*  ALT 80*  --  96*  --   --   --   --  53*  ALKPHOS 109  --  80  --   --   --   --  86  BILITOT 0.8  --  0.8  --   --   --   --  0.5  GFRNONAA >60  --  >60   < >  --  >60 >60 >60  ANIONGAP 17*   --  15   < >  --  13 8 7    < > = values in this interval not displayed.    Lipids  Recent Labs  Lab 03/31/22 0209  TRIG 89    Hematology Recent Labs  Lab 03/30/22 1242 03/30/22 2154 04/02/22 1056  WBC 3.6* 6.1 3.4*  RBC 4.53 3.67* 4.14  HGB 11.8* 9.8* 10.9*  HCT 36.6 30.1* 34.1*  MCV 80.8 82.0 82.4  MCH 26.0 26.7 26.3  MCHC 32.2 32.6 32.0  RDW 16.4* 16.4* 16.5*  PLT 137* 105* 116*   Thyroid No results for input(s): "TSH", "FREET4" in the last 168 hours.  BNP Recent Labs  Lab 03/30/22 1246  BNP 11.2    DDimer  Recent Labs  Lab 03/31/22 0209 04/01/22 0804 04/02/22 0509  DDIMER 3.64* 5.40* 2.31*     Radiology    04/03/22 Abdomen Limited RUQ (LIVER/GB)  Result Date: 04/02/2022 CLINICAL DATA:  Abdominal pain EXAM: ULTRASOUND ABDOMEN LIMITED RIGHT UPPER QUADRANT COMPARISON:  01/08/2022 FINDINGS: Gallbladder: Minimal layering sludge within the gallbladder fundus. No gallstones or wall thickening visualized. No sonographic Murphy sign noted by sonographer. Common bile duct: Diameter: 5 mm. Liver: No focal lesion identified. Diffusely increased hepatic parenchymal echogenicity. Portal vein is patent on color Doppler imaging with normal direction of blood flow towards the liver. Other: None. IMPRESSION: 1. The echogenicity of the liver is increased. This is a nonspecific finding but is most commonly seen with fatty infiltration of the liver. There are no obvious focal liver lesions. 2. Trace gallbladder sludge.  No evidence of cholecystitis. Electronically Signed   By: 04/04/2022 D.O.   On: 04/02/2022 13:58    Cardiac Studies     Patient Profile  Yvette [name] is a 41 y.o. 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, Kawasaki disease x2 during childhood, asthma, anemia, alcohol abuse, and tobacco abuse who is being seen 03/31/2022 for the evaluation of cardiac arrest  Assessment & Plan    Alcohol withdrawal Long history of  alcohol abuse, dramatic increase in agitation overnight Not responding to benzos initially, escalated to Precedex Precedex held for bradycardia  Ventricular fibrillation and cardiac arrest: -- History of ventricular fibrillation exacerbated by electrolyte abnormality/alcohol abuse in the past This admission, witnessed cardiac arrest in the emergency room, not on telemetry at the time Initial rhythm was ventricular fibrillation, treated with defibrillation and IV amiodarone --Magnesium low 1.2, potassium low likely exacerbated by alcohol abuse Electrolytes replenished Plan is to avoid QT prolonging medications such as antiemetics, aggressive electrolyte repletion, alcohol cessation   Dilated cardiomyopathy Echo March 31, 2022 : ejection fraction down to 30 to 40%  Decreased from December 2022 EF at that time 50 to 55% Likely in the setting of stress/cardiac arrest/defibrillation for VF, VT Underlying COVID, alcohol abuse Not a good candidate for ischemic work-up at this time given alcohol withdrawal, agitation -Carvedilol on hold for now as not taking p.o., had bradycardia this morning on Precedex.  May need to be restarted once tolerating p.o. meds Goal-directed medical therapy as blood pressure and heart rate tolerates   COVID-19: No respiratory distress, family denies sick contacts   Polysubstance abuse: ETOH cessation, having withdrawal symptoms, agitation    Total encounter time more than 50 minutes  Greater than 50% was spent in counseling and coordination of care with the patient  For questions or updates, please contact [facility] Please consult [email or web address] for contact info under        Signed, Julien Nordmann, MD  04/02/2022, 2:01 PM

## 2022-04-03 DIAGNOSIS — F1094 Alcohol use, unspecified with alcohol-induced mood disorder: Secondary | ICD-10-CM | POA: Diagnosis not present

## 2022-04-03 DIAGNOSIS — I4901 Ventricular fibrillation: Secondary | ICD-10-CM | POA: Diagnosis not present

## 2022-04-03 LAB — GLUCOSE, CAPILLARY
Glucose-Capillary: 102 mg/dL — ABNORMAL HIGH (ref 70–99)
Glucose-Capillary: 125 mg/dL — ABNORMAL HIGH (ref 70–99)
Glucose-Capillary: 36 mg/dL — CL (ref 70–99)
Glucose-Capillary: 73 mg/dL (ref 70–99)
Glucose-Capillary: 74 mg/dL (ref 70–99)
Glucose-Capillary: 87 mg/dL (ref 70–99)
Glucose-Capillary: 94 mg/dL (ref 70–99)

## 2022-04-03 LAB — CBC
HCT: 30.2 % — ABNORMAL LOW (ref 36.0–46.0)
Hemoglobin: 9.7 g/dL — ABNORMAL LOW (ref 12.0–15.0)
MCH: 26.4 pg (ref 26.0–34.0)
MCHC: 32.1 g/dL (ref 30.0–36.0)
MCV: 82.3 fL (ref 80.0–100.0)
Platelets: 116 10*3/uL — ABNORMAL LOW (ref 150–400)
RBC: 3.67 MIL/uL — ABNORMAL LOW (ref 3.87–5.11)
RDW: 16.7 % — ABNORMAL HIGH (ref 11.5–15.5)
WBC: 3 10*3/uL — ABNORMAL LOW (ref 4.0–10.5)
nRBC: 0 % (ref 0.0–0.2)

## 2022-04-03 LAB — BASIC METABOLIC PANEL
Anion gap: 10 (ref 5–15)
BUN: 5 mg/dL — ABNORMAL LOW (ref 6–20)
CO2: 21 mmol/L — ABNORMAL LOW (ref 22–32)
Calcium: 8.3 mg/dL — ABNORMAL LOW (ref 8.9–10.3)
Chloride: 106 mmol/L (ref 98–111)
Creatinine, Ser: 0.58 mg/dL (ref 0.44–1.00)
GFR, Estimated: >60 mL/min (ref >60)
Glucose, Bld: 104 mg/dL — ABNORMAL HIGH (ref 70–99)
Potassium: 4 mmol/L (ref 3.5–5.1)
Sodium: 137 mmol/L (ref 135–145)

## 2022-04-03 LAB — C-REACTIVE PROTEIN: CRP: 4.4 mg/dL — ABNORMAL HIGH (ref <1.0)

## 2022-04-03 LAB — PHOSPHORUS: Phosphorus: 4.9 mg/dL — ABNORMAL HIGH (ref 2.5–4.6)

## 2022-04-03 LAB — FERRITIN: Ferritin: 21 ng/mL (ref 11–307)

## 2022-04-03 LAB — D-DIMER, QUANTITATIVE: D-Dimer, Quant: >20 ug/mL-FEU — ABNORMAL HIGH (ref 0.00–0.50)

## 2022-04-03 LAB — MAGNESIUM: Magnesium: 1.4 mg/dL — ABNORMAL LOW (ref 1.7–2.4)

## 2022-04-03 MED ORDER — THIAMINE HCL 100 MG/ML IJ SOLN
500.0000 mg | Freq: Three times a day (TID) | INTRAVENOUS | Status: AC
  Administered 2022-04-04 – 2022-04-05 (×5): 500 mg via INTRAVENOUS
  Filled 2022-04-03 (×5): qty 5

## 2022-04-03 MED ORDER — THIAMINE HCL 100 MG/ML IJ SOLN: 100.0000 mg | Freq: Every day | INTRAMUSCULAR | Status: DC

## 2022-04-03 MED ORDER — THIAMINE HCL 100 MG/ML IJ SOLN
250.0000 mg | INTRAVENOUS | Status: DC
  Administered 2022-04-05: 250 mg via INTRAVENOUS
  Filled 2022-04-03 (×2): qty 2.5

## 2022-04-03 MED ORDER — MAGNESIUM SULFATE 4 GM/100ML IV SOLN
4.0000 g | Freq: Once | INTRAVENOUS | Status: AC
  Administered 2022-04-03: 4 g via INTRAVENOUS
  Filled 2022-04-03: qty 100

## 2022-04-03 NOTE — Progress Notes (Signed)
Cardiology Progress Note  Patient ID: Yvette [name] MRN: [id number] DOB: Dec 16, 1980 Date of Encounter: 04/03/2022  Primary Cardiologist: Lorine Bears, MD  Subjective   Chief Complaint: SOB  HPI: Reports that she feels short of breath.  Currently being treated for alcohol withdrawal.  Echo reviewed which shows global hypokinesis with likely alcohol induced cardiomyopathy.  Vitals stable.  ROS:  All other ROS reviewed and negative. Pertinent positives noted in the HPI.     Inpatient Medications  Scheduled Meds:  vitamin C  500 mg Oral Daily   Chlorhexidine Gluconate Cloth  6 each Topical Q0600   diazepam  2.5 mg Intravenous Q6H   enoxaparin (LOVENOX) injection  40 mg Subcutaneous Q24H   folic acid  1 mg Oral Daily   insulin aspart  0-15 Units Subcutaneous Q4H   molnupiravir EUA  4 capsule Oral BID   multivitamin with minerals  1 tablet Oral Daily   nicotine  14 mg Transdermal Daily   sodium chloride flush  3 mL Intravenous Q12H   [START ON 04/08/2022] thiamine (VITAMIN B1) injection  100 mg Intravenous Daily   zinc sulfate  220 mg Oral Daily   Continuous Infusions:  sodium chloride     sodium chloride 10 mL/hr at 04/03/22 0606   dexmedetomidine (PRECEDEX) IV infusion Stopped (04/02/22 1039)   magnesium sulfate bolus IVPB     norepinephrine (LEVOPHED) Adult infusion 1 mcg/min (04/03/22 0606)   sodium chloride 0.9 % 1,000 mL with M.V.I. Adult (INFUVITE ADULT) 10 mL infusion 75 mL/hr at 04/03/22 0606   thiamine (VITAMIN B1) injection 500 mg (04/03/22 0932)   Followed by   Melene Muller ON 04/06/2022] thiamine (VITAMIN B1) injection     PRN Meds: sodium chloride, docusate sodium, LORazepam, mouth rinse, mouth rinse, polyethylene glycol, sodium chloride flush   Vital Signs   Vitals:   04/02/22 2100 04/02/22 2200 04/02/22 2300 04/03/22 0500  BP: 110/84 109/88 (!) 125/94   Pulse: 70 74    Resp: 19 16 17    Temp:      TempSrc:      SpO2: 97% (!) 81%    Weight:    49.8 kg   Height:        Intake/Output Summary (Last 24 hours) at 04/03/2022 0902 Last data filed at 04/03/2022 0648 Gross per 24 hour  Intake 1472.85 ml  Output 1000 ml  Net 472.85 ml      04/03/2022    5:00 AM 04/02/2022    6:20 AM 04/01/2022    5:00 AM  Last 3 Weights  Weight (lbs) 109 lb 12.6 oz 107 lb 5.8 oz 112 lb 14 oz  Weight (kg) 49.8 kg 48.7 kg 51.2 kg      Telemetry  Overnight telemetry shows SR with PACs, which I personally reviewed.   Physical Exam   Vitals:   04/02/22 2100 04/02/22 2200 04/02/22 2300 04/03/22 0500  BP: 110/84 109/88 (!) 125/94   Pulse: 70 74    Resp: 19 16 17    Temp:      TempSrc:      SpO2: 97% (!) 81%    Weight:    49.8 kg  Height:        Intake/Output Summary (Last 24 hours) at 04/03/2022 0902 Last data filed at 04/03/2022 0648 Gross per 24 hour  Intake 1472.85 ml  Output 1000 ml  Net 472.85 ml       04/03/2022    5:00 AM 04/02/2022    6:20 AM 04/01/2022  5:00 AM  Last 3 Weights  Weight (lbs) 109 lb 12.6 oz 107 lb 5.8 oz 112 lb 14 oz  Weight (kg) 49.8 kg 48.7 kg 51.2 kg    Body mass index is 20.08 kg/m.  General: Well nourished, well developed, in no acute distress Head: Atraumatic, normal size  Eyes: PEERLA, EOMI  Neck: Supple, no JVD Endocrine: No thryomegaly Cardiac: Normal S1, S2; RRR; no murmurs, rubs, or gallops Lungs: Clear to auscultation bilaterally, no wheezing, rhonchi or rales  Abd: Soft, nontender, no hepatomegaly  Ext: No edema, pulses 2+ Musculoskeletal: No deformities, BUE and BLE strength normal and equal Skin: Warm and dry, no rashes   Neuro: Awake, alert, confused  Labs  High Sensitivity Troponin:   Recent Labs  Lab 03/30/22 1242 03/30/22 2021 03/30/22 2154 03/31/22 0209 03/31/22 0608  TROPONINIHS 5 24* 62* 101* 90*     Cardiac EnzymesNo results for input(s): "TROPONINI" in the last 168 hours. No results for input(s): "TROPIPOC" in the last 168 hours.  Chemistry Recent Labs  Lab 03/30/22 1242  03/30/22 2154 03/31/22 0209 04/02/22 1012 04/02/22 1056 04/03/22 0523  NA 139 140   < > 141 141 137  K 3.4* 3.4*   < > 4.5 5.0 4.0  CL 100 106   < > 109 108 106  CO2 22 19*   < > 24 26 21*  GLUCOSE 150* 170*   < > 100* 124* 104*  BUN 8 7   < > 8 8 5*  CREATININE 0.81 0.87   < > 0.50 0.66 0.58  CALCIUM 9.3 7.3*   < > 8.8* 8.8* 8.3*  PROT 9.1* 6.7  --   --  6.9  --   ALBUMIN 4.9 3.4*  --   --  3.2*  --   AST 229* 424*  --   --  64*  --   ALT 80* 96*  --   --  53*  --   ALKPHOS 109 80  --   --  86  --   BILITOT 0.8 0.8  --   --  0.5  --   GFRNONAA >60 >60   < > >60 >60 >60  ANIONGAP 17* 15   < > 8 7 10    < > = values in this interval not displayed.    Hematology Recent Labs  Lab 03/30/22 2154 04/02/22 1056 04/03/22 0523  WBC 6.1 3.4* 3.0*  RBC 3.67* 4.14 3.67*  HGB 9.8* 10.9* 9.7*  HCT 30.1* 34.1* 30.2*  MCV 82.0 82.4 82.3  MCH 26.7 26.3 26.4  MCHC 32.6 32.0 32.1  RDW 16.4* 16.5* 16.7*  PLT 105* 116* 116*   BNP Recent Labs  Lab 03/30/22 1246  BNP 11.2    DDimer  Recent Labs  Lab 03/31/22 0209 04/01/22 0804 04/02/22 0509  DDIMER 3.64* 5.40* 2.31*     Radiology  04/04/22 Abdomen Limited RUQ (LIVER/GB)  Result Date: 04/02/2022 CLINICAL DATA:  Abdominal pain EXAM: ULTRASOUND ABDOMEN LIMITED RIGHT UPPER QUADRANT COMPARISON:  01/08/2022 FINDINGS: Gallbladder: Minimal layering sludge within the gallbladder fundus. No gallstones or wall thickening visualized. No sonographic Murphy sign noted by sonographer. Common bile duct: Diameter: 5 mm. Liver: No focal lesion identified. Diffusely increased hepatic parenchymal echogenicity. Portal vein is patent on color Doppler imaging with normal direction of blood flow towards the liver. Other: None. IMPRESSION: 1. The echogenicity of the liver is increased. This is a nonspecific finding but is most commonly seen with fatty infiltration of the liver.  There are no obvious focal liver lesions. 2. Trace gallbladder sludge.  No evidence of  cholecystitis. Electronically Signed   By: Davina Poke D.O.   On: 04/02/2022 13:58    Cardiac Studies  TTE 03/31/2022  1. Left ventricular ejection fraction, by estimation, is 35 to 40%. The  left ventricle has moderately decreased function. The left ventricle  demonstrates regional wall motion abnormalities (see scoring  diagram/findings for description). Left ventricular   diastolic function could not be evaluated. There is severe hypokinesis of  the left ventricular, entire anteroseptal wall and anterior wall.   2. Right ventricular systolic function is normal. The right ventricular  size is normal.   3. The mitral valve is normal in structure. No evidence of mitral valve  regurgitation.   4. The aortic valve is tricuspid. Aortic valve regurgitation is not  visualized. Aortic valve gradient could not be assessed due to limited  acoustic windows.   Patient Profile  SOLENNE MANWARREN is a 41 y.o. female with alcohol abuse, polymorphic ventricular tachycardia, asthma, tobacco abuse who was admitted on 03/30/2022 with ventricular fibrillation/polymorphic ventricular tachycardia arrest secondary to electrolyte derangement.  Also found to have dilated cardiomyopathy secondary to alcohol abuse.  Assessment & Plan   #Ventricular fibrillation #Polymorphic ventricular tachycardia secondary to electrolyte derangement #Dilated cardiomyopathy secondary to alcohol abuse -Admitted with polymorphic ventricular tachycardia arrest in the setting of severe electrolyte derangement.  She also has a dilated cardiomyopathy secondary to alcohol abuse.  Clearly she has reasons for VF arrest. -Doing well.  Currently being treated for alcohol withdrawal in the ICU.  No further ventricular arrhythmias.  Brief NSVT overnight. -Not a candidate for ICD given ongoing alcohol abuse. -She does need optimization of heart failure medications but currently she is in the throes of alcohol withdrawal.  Would recommend to  hold cardiac medications until her condition improves. -No signs of volume overload.  No need for diuresis. -Cardiology will follow along remotely until she is out of the ICU.  We will then assist with CHF medications.    For questions or updates, please contact [facility] Please consult [email or web address] for contact info under   Signed, [place] B[place] T. Audie Box, MD, [place]  04/03/2022 9:02 AM

## 2022-04-03 NOTE — Progress Notes (Signed)
PHARMACY CONSULT NOTE - FOLLOW UP  Pharmacy Consult for Electrolyte Monitoring and Replacement   Recent Labs: Potassium (mmol/L)  Date Value  04/03/2022 4.0   Magnesium (mg/dL)  Date Value  04/03/2022 1.4 (L)   Calcium (mg/dL)  Date Value  04/03/2022 8.3 (L)   Albumin (g/dL)  Date Value  04/02/2022 3.2 (L)  02/05/2021 4.9 (H)   Phosphorus (mg/dL)  Date Value  04/03/2022 4.9 (H)   Sodium (mmol/L)  Date Value  04/03/2022 137  02/05/2021 141     Assessment: 41 y/o female with h/o Kawasaki's disease, etoh abuse and cirrhosis who is admitted with COVID 19 complicated by cardiac arrest with ventricular fibrillation and alcohol withdrawal. Pharmacy is asked to follow and replace electrolytes while in CCU   Goal of Therapy:  Potassium 4.0 - 5.1 mmol/L Magnesium 2.0 - 2.4 mg/dL All Other Electrolytes WNL  Plan:  --4 grams IV magnesium sulfate x 1 --recheck electrolytes in am  Dallie Piles ,PharmD Clinical Pharmacist 04/03/2022 6:47 AM

## 2022-04-03 NOTE — Consult Note (Signed)
[facility] Face-to-Face Psychiatry Consult   Reason for Consult:  suicidal ideation Referring Physician:  Dr Chipper Herb Patient Identification: Yvette [name] MRN:  [id number] Principal Diagnosis: Ventricular fibrillation [facility]) Diagnosis:  Principal Problem:   Ventricular fibrillation (HCC) Active Problems:   Hypokalemia   Thrombocytopenia (HCC)   Alcoholic cirrhosis of liver without ascites (HCC)   Alcohol abuse   Nausea vomiting and diarrhea   Hypomagnesemia   Hypophosphatemia   Acute hypoxemic respiratory failure (HCC)   Chronic systolic CHF (congestive heart failure) (HCC)   COVID-19 virus infection   Dilated cardiomyopathy (HCC)   Total Time spent with patient: 45 minutes  Subjective:   MARSHELIA [name] is a 41 y.o. female patient admitted with V fib.  HPI:  41 yo female admitted for medical issues.  Evidently when she was unable to go home, she commented she might as well kill herself as she was upset.  Calm and cooperative for the past 24 hours with no threat to self or others.  On assessment, she denies suicidal/homicidal ideations, hallucinations, and withdrawal symptoms.  Recommended rehab for alcohol abuse, she reports drinking only one beer daily, declined rehab.  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 Noted during admission 06/2021 assoc w/ palpitations and presyncope.   Tobacco abuse    Transaminitis     Past Surgical History:  Procedure Laterality Date   ESOPHAGOGASTRODUODENOSCOPY N/A 10/29/2020   Procedure:  ESOPHAGOGASTRODUODENOSCOPY (EGD);  Surgeon: Wyline Mood, MD;  Location: [facility] ENDOSCOPY;  Service: Gastroenterology;  [name];   ESOPHAGOGASTRODUODENOSCOPY (EGD) WITH PROPOFOL N/A 03/14/2020   Procedure: ESOPHAGOGASTRODUODENOSCOPY (EGD) WITH PROPOFOL;  Surgeon: Pasty Spillers, MD;  [name];  Service: Endoscopy;  [name];   ESOPHAGOGASTRODUODENOSCOPY (EGD) WITH PROPOFOL N/A 02/25/2021   Procedure: ESOPHAGOGASTRODUODENOSCOPY (EGD) WITH PROPOFOL;  Surgeon: Pasty Spillers, MD;  [name];  Service: Endoscopy;  [name];   Family History:  Family History  Problem Relation Age of Onset   Lupus Mother    Prostate cancer Father    Cancer Paternal Aunt        Breast    Cancer Paternal Aunt        Breast    Family Psychiatric  History: see above Social History:  Social History   Substance and Sexual Activity  Alcohol Use Yes   Alcohol/week: 61.0 standard drinks of alcohol   Types: 21 Cans of beer, 40 Standard drinks or equivalent per week   Comment: daily     Social History   Substance and Sexual Activity  Drug Use No    Social History   Socioeconomic History   Marital status: Single    Spouse name: Not on file   Number of children: Not on file   Years of education: Not on file   Highest education level: Not on file  Occupational History   Not on file  Tobacco Use   Smoking status: Every Day  Packs/day: 0.25    Years: 10.00    Total pack years: 2.50    Types: Cigarettes   Smokeless tobacco: Never  Vaping Use   Vaping Use: Never used  Substance and Sexual Activity   Alcohol use: Yes    Alcohol/week: 61.0 standard drinks of alcohol    Types: 21 Cans of beer, [name]   Drug use: No   Sexual activity: Yes    Partners: Male    Birth control/protection: Injection  Other Topics Concern   Not on file  Social History Narrative   Lives locally with husband and  daughter.  Previously lived in [place], [place].  Does not routinely exercise.   Social Determinants of Health   Financial Resource Strain: Not on file  Food Insecurity: No Food Insecurity (04/01/2022)   Hunger Vital Sign    Worried About Running Out of Food in the Last Year: Never true    Ran Out of Food in the Last Year: Never true  Transportation Needs: No Transportation Needs (04/01/2022)   PRAPARE - Administrator, Civil Service (Medical): No    Lack of Transportation (Non-Medical): No  Physical Activity: Not on file  Stress: Not on file  Social Connections: Not on file   Additional Social History:    Allergies:  No Known Allergies  Labs:  Results for orders placed or performed during the hospital encounter of 03/30/22 (from the past 48 hour(s))  Glucose, capillary     Status: None   Collection Time: 04/01/22 11:49 AM  Result Value Ref Range   Glucose-Capillary 88 70 - 99 mg/dL    Comment: Glucose reference range applies only to samples taken after fasting for at least 8 hours.  Glucose, capillary     Status: None   Collection Time: 04/01/22  4:29 PM  Result Value Ref Range   Glucose-Capillary 94 70 - 99 mg/dL    Comment: Glucose reference range applies only to samples taken after fasting for at least 8 hours.  Glucose, capillary     Status: None   Collection Time: 04/01/22  8:54 PM  Result Value Ref Range   Glucose-Capillary 82 70 - 99 mg/dL    Comment: Glucose reference range applies only to samples taken after fasting for at least 8 hours.  Glucose, capillary     Status: Abnormal   Collection Time: 04/02/22  4:05 AM  Result Value Ref Range   Glucose-Capillary 116 (H) 70 - 99 mg/dL    Comment: Glucose reference range applies only to samples taken after fasting for at least 8 hours.  C-reactive protein     Status: Abnormal   Collection Time: 04/02/22  5:09 AM  Result Value Ref Range   CRP 8.3 (H) <1.0 mg/dL    Comment: Performed at [facility], [address], [place], [place] [address]  D-dimer, quantitative     Status: Abnormal   Collection Time: 04/02/22  5:09 AM  Result Value Ref Range   D-Dimer, Quant 2.31 (H) 0.00 - 0.50 ug/mL-FEU    Comment: (NOTE) At the manufacturer cut-off value of 0.5 g/mL FEU, this assay has a negative predictive value of 95-100%.This assay is intended for use in conjunction with a clinical pretest probability (PTP) assessment model to exclude pulmonary embolism (PE) and deep venous thrombosis (DVT) in outpatients suspected of PE or DVT. Results should be correlated with clinical presentation. Performed at [facility],  [place], [place] [address]   Glucose, capillary     Status: Abnormal   Collection Time: 04/02/22  8:20 AM  Result Value Ref Range   Glucose-Capillary 101 (H) 70 - 99 mg/dL    Comment: Glucose reference range applies only to samples taken after fasting for at least 8 hours.  Basic metabolic panel     Status: Abnormal   Collection Time: 04/02/22 10:12 AM  Result Value Ref Range   Sodium 141 135 - 145 mmol/L   Potassium 4.5 3.5 - 5.1 mmol/L   Chloride 109 98 - 111 mmol/L   CO2 24 22 - 32 mmol/L   Glucose, Bld 100 (H) 70 - 99 mg/dL    Comment: Glucose reference range applies only to samples taken after fasting for at least 8 hours.   BUN 8 6 - 20 mg/dL   Creatinine, Ser 0.50 0.44 - 1.00 mg/dL   Calcium 8.8 (L) 8.9 - 10.3 mg/dL   GFR, Estimated >60 >60 mL/min    Comment: (NOTE) Calculated using the CKD-EPI Creatinine Equation (2021)    Anion gap 8 5 - 15    Comment: Performed at [facility], [address] [place], [place] [address]  Ferritin     Status: None   Collection Time: 04/02/22 10:12 AM  Result Value Ref Range   Ferritin 25 11 - 307 ng/mL    Comment: Performed at [facility], [address] [place], [place] [address]  Magnesium     Status: None   Collection Time: 04/02/22 10:12 AM  Result Value Ref Range    Magnesium 2.0 1.7 - 2.4 mg/dL    Comment: Performed at [facility], [address] [place], [place] [address]  Phosphorus     Status: None   Collection Time: 04/02/22 10:12 AM  Result Value Ref Range   Phosphorus 4.2 2.5 - 4.6 mg/dL    Comment: Performed at [facility], [address] [place], [place] [address]  Culture, blood (Routine X 2) w Reflex to ID Panel     Status: None (Preliminary result)   Collection Time: 04/02/22 10:34 AM   Specimen: BLOOD  Result Value Ref Range   Specimen Description BLOOD BLOOD LEFT HAND    Special Requests      BOTTLES DRAWN AEROBIC ONLY Blood Culture results may not be optimal due to an inadequate volume of blood received in culture bottles   Culture      NO GROWTH < 24 HOURS Performed at [facility], [address], [place], [place] [address]    Report Status PENDING   Comprehensive metabolic panel     Status: Abnormal   Collection Time: 04/02/22 10:56 AM  Result Value Ref Range   Sodium 141 135 - 145 mmol/L   Potassium 5.0 3.5 - 5.1 mmol/L   Chloride 108 98 - 111 mmol/L   CO2 26 22 - 32 mmol/L   Glucose, Bld 124 (H) 70 - 99 mg/dL    Comment: Glucose reference range applies only to samples taken after fasting for at least 8 hours.   BUN 8 6 - 20 mg/dL   Creatinine, Ser 0.66 0.44 - 1.00 mg/dL   Calcium 8.8 (L) 8.9 - 10.3 mg/dL   Total Protein 6.9 6.5 - 8.1 g/dL   Albumin 3.2 (L) 3.5 - 5.0 g/dL   AST 64 (H) 15 - 41 U/L   ALT 53 (H) 0 - 44 U/L   Alkaline Phosphatase  86 38 - 126 U/L   Total Bilirubin 0.5 0.3 - 1.2 mg/dL   GFR, Estimated >40 >98 mL/min    Comment: (NOTE) Calculated using the CKD-EPI Creatinine Equation (2021)    Anion gap 7 5 - 15    Comment: Performed at [facility], [address] [place], [place] [address]  CBC with Differential/Platelet     Status: Abnormal   Collection Time: 04/02/22 10:56 AM  Result Value Ref Range   WBC 3.4 (L) 4.0 - 10.5 K/uL   RBC 4.14 3.87 - 5.11  MIL/uL   Hemoglobin 10.9 (L) 12.0 - 15.0 g/dL   HCT 78.2 (L) 95.6 - 21.3 %   MCV 82.4 80.0 - 100.0 fL   MCH 26.3 26.0 - 34.0 pg   MCHC 32.0 30.0 - 36.0 g/dL   RDW 08.6 (H) 57.8 - 46.9 %   Platelets 116 (L) 150 - 400 K/uL   nRBC 0.0 0.0 - 0.2 %   Neutrophils Relative % 69 %   Neutro Abs 2.3 1.7 - 7.7 K/uL   Lymphocytes Relative 16 %   Lymphs Abs 0.6 (L) 0.7 - 4.0 K/uL   Monocytes Relative 13 %   Monocytes Absolute 0.5 0.1 - 1.0 K/uL   Eosinophils Relative 2 %   Eosinophils Absolute 0.1 0.0 - 0.5 K/uL   Basophils Relative 0 %   Basophils Absolute 0.0 0.0 - 0.1 K/uL   Immature Granulocytes 0 %   Abs Immature Granulocytes 0.01 0.00 - 0.07 K/uL    Comment: Performed at [facility], [address] [place], [place] [address]  Culture, blood (Routine X 2) w Reflex to ID Panel     Status: None (Preliminary result)   Collection Time: 04/02/22 12:10 PM   Specimen: BLOOD RIGHT HAND  Result Value Ref Range   Specimen Description BLOOD RIGHT HAND    Special Requests      BOTTLES DRAWN AEROBIC ONLY Blood Culture results may not be optimal due to an inadequate volume of blood received in culture bottles   Culture      NO GROWTH < 24 HOURS Performed at [facility], [address] [place], [place] [address]    Report Status PENDING   Glucose, capillary     Status: Abnormal   Collection Time: 04/02/22  4:54 PM  Result Value Ref Range   Glucose-Capillary 63 (L) 70 - 99 mg/dL    Comment: Glucose reference range applies only to samples taken after fasting for at least 8 hours.  Glucose, capillary     Status: Abnormal   Collection Time: 04/02/22  6:29 PM  Result Value Ref Range   Glucose-Capillary 131 (H) 70 - 99 mg/dL    Comment: Glucose reference range applies only to samples taken after fasting for at least 8 hours.  Glucose, capillary     Status: None   Collection Time: 04/02/22  7:24 PM  Result Value Ref Range   Glucose-Capillary 73 70 - 99 mg/dL    Comment:  Glucose reference range applies only to samples taken after fasting for at least 8 hours.  Glucose, capillary     Status: None   Collection Time: 04/02/22 11:46 PM  Result Value Ref Range   Glucose-Capillary 87 70 - 99 mg/dL    Comment: Glucose reference range applies only to samples taken after fasting for at least 8 hours.  Glucose, capillary     Status: None   Collection Time: 04/03/22  3:31 AM  Result  Value Ref Range   Glucose-Capillary 94 70 - 99 mg/dL    Comment: Glucose reference range applies only to samples taken after fasting for at least 8 hours.  Basic metabolic panel     Status: Abnormal   Collection Time: 04/03/22  5:23 AM  Result Value Ref Range   Sodium 137 135 - 145 mmol/L   Potassium 4.0 3.5 - 5.1 mmol/L   Chloride 106 98 - 111 mmol/L   CO2 21 (L) 22 - 32 mmol/L   Glucose, Bld 104 (H) 70 - 99 mg/dL    Comment: Glucose reference range applies only to samples taken after fasting for at least 8 hours.   BUN 5 (L) 6 - 20 mg/dL   Creatinine, Ser 1.61 0.44 - 1.00 mg/dL   Calcium 8.3 (L) 8.9 - 10.3 mg/dL   GFR, Estimated >09 >60 mL/min    Comment: (NOTE) Calculated using the CKD-EPI Creatinine Equation (2021)    Anion gap 10 5 - 15    Comment: Performed at [facility], [address] [place], [place] [address]  Magnesium     Status: Abnormal   Collection Time: 04/03/22  5:23 AM  Result Value Ref Range   Magnesium 1.4 (L) 1.7 - 2.4 mg/dL    Comment: Performed at [facility], [address], [place], [place] [address]  Phosphorus     Status: Abnormal   Collection Time: 04/03/22  5:23 AM  Result Value Ref Range   Phosphorus 4.9 (H) 2.5 - 4.6 mg/dL    Comment: Performed at [facility], [address] [place], [place] [address]  C-reactive protein     Status: Abnormal   Collection Time: 04/03/22  5:23 AM  Result Value Ref Range   CRP 4.4 (H) <1.0 mg/dL    Comment: Performed at [facility], [address], [place], [place]  [address]  D-dimer, quantitative     Status: Abnormal   Collection Time: 04/03/22  5:23 AM  Result Value Ref Range   D-Dimer, Quant >20.00 (H) 0.00 - 0.50 ug/mL-FEU    Comment: (NOTE) At the manufacturer cut-off value of 0.5 g/mL FEU, this assay has a negative predictive value of 95-100%.This assay is intended for use in conjunction with a clinical pretest probability (PTP) assessment model to exclude pulmonary embolism (PE) and deep venous thrombosis (DVT) in outpatients suspected of PE or DVT. Results should be correlated with clinical presentation. Performed at [facility], [address] [place], [place] [address]   Ferritin     Status: None   Collection Time: 04/03/22  5:23 AM  Result Value Ref Range   Ferritin 21 11 - 307 ng/mL    Comment: Performed at [facility], [address] [place], [place] [address]  CBC     Status: Abnormal   Collection Time: 04/03/22  5:23 AM  Result Value Ref Range   WBC 3.0 (L) 4.0 - 10.5 K/uL   RBC 3.67 (L) 3.87 - 5.11 MIL/uL   Hemoglobin 9.7 (L) 12.0 - 15.0 g/dL   HCT 29.5 (L) 28.4 - 13.2 %   MCV 82.3 80.0 - 100.0 fL   MCH 26.4 26.0 - 34.0 pg   MCHC 32.1 30.0 - 36.0 g/dL   RDW 44.0 (H) 10.2 - 72.5 %   Platelets 116 (L) 150 - 400 K/uL   nRBC 0.0 0.0 - 0.2 %    Comment: Performed at [facility], [address], [place], [place] [address]  Glucose, capillary     Status: Abnormal   Collection Time: 04/03/22  7:17 AM  Result Value Ref Range   Glucose-Capillary 36 (LL) 70 - 99 mg/dL    Comment: Glucose reference range applies only to samples taken after fasting for at least 8 hours.  Glucose, capillary     Status: None   Collection Time: 04/03/22  8:10 AM  Result Value Ref Range   Glucose-Capillary 74 70 - 99 mg/dL    Comment: Glucose reference range applies only to samples taken after fasting for at least 8 hours.    Current Facility-Administered Medications  Medication Dose Route Frequency Provider Last  Rate Last Admin   0.9 %  sodium chloride infusion  250 mL Intravenous PRN Belia Heman, Kurian, MD       0.9 %  sodium chloride infusion  250 mL Intravenous Continuous Vida Rigger, MD 10 mL/hr at 04/03/22 0606 Infusion Verify at 04/03/22 0606   ascorbic acid (VITAMIN C) tablet 500 mg  500 mg Oral Daily Tressie Ellis, RPH   500 mg at 04/01/22 1035   Chlorhexidine Gluconate Cloth 2 % PADS 6 each  6 each Topical [id number] Erin Fulling, MD   6 each at 03/30/22 2119   dexmedetomidine (PRECEDEX) 400 MCG/100ML (4 mcg/mL) infusion  0.4-1.2 mcg/kg/hr Intravenous Titrated Mansy, Jan A, MD   Stopped at 04/02/22 1039   diazepam (VALIUM) injection 2.5 mg  2.5 mg Intravenous Q6H Aleskerov, Luis Abed, MD   2.5 mg at 04/03/22 0531   docusate sodium (COLACE) capsule 100 mg  100 mg Oral BID PRN Tressie Ellis, RPH       enoxaparin (LOVENOX) injection 40 mg  40 mg Subcutaneous Q24H Rust-Chester, Britton L, NP   40 mg at 04/01/22 0811   folic acid (FOLVITE) tablet 1 mg  1 mg Oral Daily Tressie Ellis, RPH   1 mg at 04/01/22 1035   insulin aspart (novoLOG) injection 0-15 Units  0-15 Units Subcutaneous Q4H Rust-Chester, Britton L, NP   5 Units at 03/31/22 1620   LORazepam (ATIVAN) injection 1 mg  1 mg Intravenous Q1H PRN Mansy, Jan A, MD   1 mg at 04/02/22 2252   magnesium sulfate IVPB 4 g 100 mL  4 g Intravenous Once Lowella Bandy, RPH       molnupiravir EUA (LAGEVRIO) capsule 800 mg  4 capsule Oral BID Otelia Sergeant, RPH   800 mg at 04/01/22 2107   multivitamin with minerals tablet 1 tablet  1 tablet Oral Daily Tressie Ellis, RPH   1 tablet at 04/01/22 1035   nicotine (NICODERM CQ - dosed in mg/24 hours) patch 14 mg  14 mg Transdermal Daily Ezequiel Essex, NP   14 mg at 04/01/22 1035   norepinephrine (LEVOPHED)  in (0.016 mg/mL) premix infusion  2-10 mcg/min Intravenous Titrated Vida Rigger, MD 3.75 mL/hr at 04/03/22 0606 1 mcg/min at 04/03/22 0606   Oral care mouth rinse  15 mL Mouth Rinse PRN  Rust-Chester, Cecelia Byars, NP       Oral care mouth rinse  15 mL Mouth Rinse PRN Erin Fulling, MD       polyethylene glycol (MIRALAX / GLYCOLAX) packet 17 g  17 g Oral Daily PRN Dorothea Ogle B, RPH       sodium chloride 0.9 % 1,000 mL with M.V.I. Adult (INFUVITE ADULT) 10 mL infusion   Intravenous Continuous Vida Rigger, MD 75 mL/hr at 04/03/22 0606 Infusion Verify at 04/03/22 [phone]  sodium chloride flush (NS) 0.9 % injection 3 mL  3 mL Intravenous Q12H Erin Fulling, MD   3 mL at 04/02/22 2246   sodium chloride flush (NS) 0.9 % injection 3 mL  3 mL Intravenous PRN Erin Fulling, MD       thiamine (VITAMIN B1) 500 mg in normal saline (50 mL) IVPB  500 mg Intravenous Q8H Vida Rigger, MD 100 mL/hr at 04/03/22 0633 500 mg at 04/03/22 1610   Followed by   Melene Muller ON 04/06/2022] thiamine (VITAMIN B1) 250 mg in sodium chloride 0.9 % 50 mL IVPB  250 mg Intravenous Q24H Vida Rigger, MD       Followed by   Melene Muller ON 04/08/2022] thiamine (VITAMIN B1) injection 100 mg  100 mg Intravenous Daily Vida Rigger, MD       zinc sulfate capsule 220 mg  220 mg Oral Daily Tressie Ellis, RPH   220 mg at 04/01/22 1035    Musculoskeletal: Strength & Muscle Tone: decreased Gait & Station:  did not witness Patient leans: N/A  Psychiatric Specialty Exam: Physical Exam Vitals and nursing note reviewed.  Constitutional:      Appearance: She is well-developed.  HENT:     Head: Normocephalic.  Neurological:     Mental Status: She is alert.  Psychiatric:        Attention and Perception: Attention and perception normal.        Mood and Affect: Mood and affect normal.        Speech: Speech normal.        Behavior: Behavior normal. Behavior is cooperative.        Thought Content: Thought content normal.        Cognition and Memory: Cognition and memory normal.        Judgment: Judgment normal.     Review of Systems  Psychiatric/Behavioral:  Positive for substance abuse.   All other systems reviewed  and are negative.   Blood pressure (!) 125/94, pulse 74, temperature 98.7 F (37.1 C), temperature source Axillary, resp. rate 17, height  (1.575 m), weight 49.8 kg, SpO2 (!) 81 %.Body mass index is 20.08 kg/m.  General Appearance: Casual  Eye Contact:  Good  Speech:  Normal Rate  Volume:  Normal  Mood:  Euthymic  Affect:  Congruent  Thought Process:  Coherent  Orientation:  Full (Time, Place, and Person)  Thought Content:  WDL and Logical  Suicidal Thoughts:  No  Homicidal Thoughts:  No  Memory:  Immediate;   Good Recent;   Good Remote;   Good  Judgement:  Fair  Insight:  Fair  Psychomotor Activity:  Decreased  Concentration:  Concentration: Good and Attention Span: Good  Recall:  Good  Fund of Knowledge:  Fair  Language:  Good  Akathisia:  No  Handed:  Right  AIMS (if indicated):     Assets:  Housing Leisure Time Resilience Social Support  ADL's:  Intact  Cognition:  WNL  Sleep:        Physical Exam: Physical Exam Vitals and nursing note reviewed.  Constitutional:      Appearance: She is well-developed.  HENT:     Head: Normocephalic.  Neurological:     Mental Status: She is alert.  Psychiatric:        Attention and Perception: Attention and perception normal.        Mood and Affect: Mood and affect normal.        Speech: Speech normal.  Behavior: Behavior normal. Behavior is cooperative.        Thought Content: Thought content normal.        Cognition and Memory: Cognition and memory normal.        Judgment: Judgment normal.    Review of Systems  Psychiatric/Behavioral:  Positive for substance abuse.   All other systems reviewed and are negative.  Blood pressure (!) 125/94, pulse 74, temperature 98.7 F (37.1 C), temperature source Axillary, resp. rate 17, height 5\' 2"  (1.575 m), weight 49.8 kg, SpO2 (!) 81 %. Body mass index is 20.08 kg/m.  Treatment Plan Summary: Alcohol induced mood disorder: Client declined rehab, not  interested  Disposition: No evidence of imminent risk to self or others at present.    , NP 04/03/2022 11:17 AM

## 2022-04-03 NOTE — Progress Notes (Signed)
Patient had an eventful day, by the end of the shift patient's restraints had been removed, patient continues disoriented x 4 and hallucinating, but able to carry pleasant conversation.  Patient up to the bedside commode with one assist and tolerated well.

## 2022-04-03 NOTE — Progress Notes (Signed)
Decent night overall, no violent outbursts, she did refuse to take Anticovid medication, stating that she does not know that one despite me attempting to educated her on the purpose of the medication. Still in restraints to keep her from pulling lines or devices off.

## 2022-04-03 NOTE — Plan of Care (Signed)
Continuing with plan of care. 

## 2022-04-03 NOTE — Consult Note (Addendum)
NAME:  Yvette [name], MRN:  [id number], DOB:  02-18-1981, LOS: 4 ADMISSION DATE:  03/30/2022, CONSULTATION DATE:  04/02/2022 REFERRING MD:  Dr. Chipper Herb, CHIEF COMPLAINT:  Severe DT's   Brief Pt Description / Synopsis:  41 yo AAF with end stage liver cirrhosis admitted to ICU for acute V. Fib Cardiac arrest, suspect secondary to R-on-T PVC in the setting of prolonged Qtc & severe hypomagnesemia/Hypokalemia, along with COVID 19 infection.  Course complicated by severe DT's.   04/03/22 - patient is improved but is still requiring levophed , weaned to .  She has periods of auditory hallucinations but overall withdrawal symptoms have improved. Patient got out of bed and walked to commode today.   Will optimize medically for TRH in AM  History of Present Illness:  41 yo F presenting to [facility] ED from home via EMS on 03/30/22 with complaints of nausea/vomiting since midnight 9/19. History obtained via ED documentation, supplemented by father Yvette [name] via phone interview. She reported in triage that she had taken medication without relief and was still vomiting clear liquid. She denied abdominal pain. Her father reported that she had complained of a cold symptoms over the last few days, otherwise was in her normal state of health. She told ED staff that her last drink was Sunday day.   Per chart review the patient has had multiple admissions with gastritis and Transaminitis during which time she develops refractory nausea & vomiting with hypokalemia & hypomagnesemia. She also has a previous history of polymorphic ventricular tachycardia. In June 2023 she reported cutting back on her alcohol consumption to 1-2 16 oz beers daily.   ED course: Upon arrival patient afebrile with stable vital signs complaining of nausea and vomiting but no pain.  While being observed in the ED where she was ambulatory the patient refused to wear monitoring equipment requesting nausea medication.  After she received  droperidol she subsequently became agonal which was noticed immediately by her nurse bedside.  Patient respiratory arrested requiring bag-valve-mask support.  No palpable pulse and CPR was started with the first recognized rhythm as V-fib.  The patient was defibrillated with ACLS medications per protocol provided.  Next pulse check showed V. tach with a palpable pulse heart rate 200 and BP 100/50.  300 mg amiodarone was given and she was synchronized cardioverted to sinus tach.  The patient was then emergently intubated and placed on mechanical ventilatory support.  COVID-19: positive   ABG: 7.14/ 55/ 122/ 18.7 CXR 03/30/22: there are patchy densities in the LUL & LLL suggesting atelectasis vs pneumonia CT head wo contrast 03/30/22: no acute intracranial abnormality. Mild cerebral volume loss, advanced for age. Mild paranasal sinus disease. CT angio chest PE w or wo contrast 03/30/22: no evidence of PE, no acute vascular abnormality. Bibasilar dependent atelectasis. Irregularity of the sternum as well as multiple bilateral broken ribs. CT abdomen/pelvis w contrast 03/30/22: no acute process. Gallbladder distended with bile.   PCCM consulted for admission due to acute respiratory arrest leading to cardiac arrest and mechanical intubation requiring ventilatory support.  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 Noted during admission 06/2021 assoc w/ palpitations and presyncope.   Tobacco abuse    Transaminitis     Micro Data:  9/19: SARS-CoV-2 PCR>> positive 9/19: MRSA PCR>> negative 9/22: Blood culture x2>>  Antimicrobials:  Unasyn 9/20>> 9/20  Significant Hospital Events: Including  procedures, antibiotic start and stop dates in addition to other pertinent events   03/30/22: Admit to ICU post acute respiratory arrest leading to cardiac arrest and mechanical intubation requiring ventilatory support. (9 minutes downtime, received 1 defibrillation & 1 synchronized  cardioversion. Normothermia protocol implemented. 03/31/22: Cardiology consulted. EXTUBATED. 04/01/22: TRH assumed care.  Overnight developed severe DT's requiring Precedex gtt. 04/02/22: Ongoing severe DT's.  Bradycardia with precedex, transient episode of hypotension briefly requiring Levophed.  PCCM consulted.  Precedex held, placed on scheduled Valium.  Interim History / Subjective:  -Overnight developed severe agitation and violent towards staff ~was placed on Precedex drip but ultimately required four-point restraints -Had episode of hypoxia while refusing oxygen, transient episode of hypotension briefly requiring Levophed -Bradycardia with Precedex, Precedex held ~placed on scheduled Valium every 6 hours with as needed Ativan  Objective   Blood pressure 112/72, pulse 87, temperature 97.7 F (36.5 C), temperature source Oral, resp. rate (!) 24, height 5\' 2"  (1.575 m), weight 49.8 kg, SpO2 100 %.        Intake/Output Summary (Last 24 hours) at 04/03/2022 1720 Last data filed at 04/03/2022 04/05/2022 Gross per 24 hour  Intake 1328.42 ml  Output 850 ml  Net 478.42 ml    Filed Weights   04/01/22 0500 04/02/22 0620 04/03/22 0500  Weight: 51.2 kg 48.7 kg 49.8 kg    Examination: General: Acute on chronically ill-appearing female, sedated on Precedex, on nasal cannula, no acute distress HENT: Atraumatic, normocephalic, neck supple, no JVD Lungs: Clear diminished breath sounds bilaterally, even, nonlabored Cardiovascular: Tachycardia, regular rhythm, S1-S2, no murmurs, rubs, gallops Abdomen: Soft, nontender, nondistended, no guarding rebound tenderness, bowel sounds positive x4 Extremities: Normal bulk and tone, no  deformities, no edema Neuro: Heavily sedated on Precedex, unable to follow commands, pupils PERRLA GU: Deferred   Assessment & Plan:   #Acute Metabolic Encephalopathy #Severe DT's PMHx: Polysubstance abuse -Provide supportive care -Seizure and aspiration precautions -CIWA Protocol -Scheduled and prn benzo's -Precedex if needed -Thiamine, MVI, and folic acid  #Acute Hypoxic Respiratory Failure in setting of cardiac arrest, COVID-19 Pneumonia, Query Aspiration EXTUBATED 9/20 -Supplemental O2 as needed to maintain O2 sats 90% -Currently protecting airway, but high risk for reintubation -Follow intermittent Chest X-ray & ABG as needed -Bronchodilators & Pulmicort nebs -IV Steroids -ABX as above -Pulmonary toilet as able  #V. Fib cardiac arrest, suspect due to prolonged QTc along with multiple metabolic derangements #Dilated Cardiomyopathy -Continuous cardiac monitoring -Maintain MAP >65 -Vasopressors as needed to maintain MAP goal -Trend HS Troponin until peaked -Echocardiogram 03/31/22: LVEF 30-40% -Cardiology following, appreciate input -Avoid QT prolonging medications -Aggressive electrolyte repletion  #Mild Leukopenia #COVID-19 Infection #Query aspiration -Monitor fever curve -Trend WBC's and inflammatory markers  -Follow cultures as above -ABX discontinued as PCT negative x3 -To complete course of Molnupiravir -Vitamin C and Zinc -Maintain airborne and contact precautions  #Hypokalemia ~ RESOLVED #Hypomagnesemia ~ RESOLVED #Hypophosphatemia ~ RESOLVED -Monitor I&O's / urinary output -Follow BMP -Ensure adequate renal perfusion -Avoid nephrotoxic agents as able -Replace electrolytes as indicated -Pharmacy following for assistance with electrolyte replacement  #Alcohol Cirrhosis -Trend LFT's and coags -Encourage ETOH cessation       Best Practice (right click and "  Reselect all SmartList Selections" daily)   Diet/type: NPO DVT prophylaxis: LMWH GI  prophylaxis: N/A Lines: Central line and yes and it is still needed Foley:  N/A Code Status:  full code Last date of multidisciplinary goals of care discussion [04/02/22]  Labs   CBC: Recent Labs  Lab 03/30/22 1242 03/30/22 2154 04/02/22 1056 04/03/22 0523  WBC 3.6* 6.1 3.4* 3.0*  NEUTROABS  --  5.4 2.3  --   HGB 11.8* 9.8* 10.9* 9.7*  HCT 36.6 30.1* 34.1* 30.2*  MCV 80.8 82.0 82.4 82.3  PLT 137* 105* 116* 116*     Basic Metabolic Panel: Recent Labs  Lab 03/31/22 0209 03/31/22 1455 04/01/22 0804 04/02/22 1012 04/02/22 1056 04/03/22 0523  NA 142  --  140 141 141 137  K 3.3* 3.6 3.5 4.5 5.0 4.0  CL 108  --  102 109 108 106  CO2 24  --  25 24 26  21*  GLUCOSE 121*  --  84 100* 124* 104*  BUN 7  --  5* 8 8 5*  CREATININE 0.85  --  0.68 0.50 0.66 0.58  CALCIUM 7.6*  --  8.3* 8.8* 8.8* 8.3*  MG 4.7* 2.5* 1.9 2.0  --  1.4*  PHOS 1.4* 5.0* 2.2* 4.2  --  4.9*    GFR: Estimated Creatinine Clearance: 75 mL/min (by C-G formula based on SCr of 0.58 mg/dL). Recent Labs  Lab 03/30/22 1242 03/30/22 1246 03/30/22 2021 03/30/22 2154 03/31/22 0209 04/01/22 0804 04/02/22 1056 04/03/22 0523  PROCALCITON  --  <0.10  --   --  <0.10 <0.10  --   --   WBC 3.6*  --   --  6.1  --   --  3.4* 3.0*  LATICACIDVEN  --   --  1.9 3.6* 1.7  --   --   --      Liver Function Tests: Recent Labs  Lab 03/30/22 1242 03/30/22 2154 04/02/22 1056  AST 229* 424* 64*  ALT 80* 96* 53*  ALKPHOS 109 80 86  BILITOT 0.8 0.8 0.5  PROT 9.1* 6.7 6.9  ALBUMIN 4.9 3.4* 3.2*    Recent Labs  Lab 03/30/22 1242  LIPASE 22    No results for input(s): "AMMONIA" in the last 168 hours.  ABG    Component Value Date/Time   PHART 7.52 (H) 03/31/2022 0511   PCO2ART 28 (L) 03/31/2022 0511   PO2ART 99 03/31/2022 0511   HCO3 22.9 03/31/2022 0511   ACIDBASEDEF 1.4 03/30/2022 2345   O2SAT 98 03/31/2022 0511     Coagulation Profile: Recent Labs  Lab 03/30/22 2021 03/30/22 2154  INR 1.2 1.2      Cardiac Enzymes: No results for input(s): "CKTOTAL", "CKMB", "CKMBINDEX", "TROPONINI" in the last 168 hours.  HbA1C: Hgb A1c MFr Bld  Date/Time Value Ref Range Status  04/01/2022 07:47 AM 5.6 4.8 - 5.6 % Final    Comment:    (NOTE) Pre diabetes:          5.7%-6.4%  Diabetes:              >6.4%  Glycemic control for   <7.0% adults with diabetes   03/30/2022 12:30 PM 12.5 (H) 4.8 - 5.6 % Final    Comment:    (NOTE) Pre diabetes:          5.7%-6.4%  Diabetes:              >6.4%  Glycemic control for   <7.0% adults with diabetes  CBG: Recent Labs  Lab 04/03/22 0331 04/03/22 0717 04/03/22 0810 04/03/22 1234 04/03/22 1708  GLUCAP 94 36* 74 102* 87     Review of Systems:   Unable to assess due to AMS/sedation/critical illness   Past Medical History:  She,  has a past medical history of Asthma, ETOH abuse, H/O Kawasaki's disease, History of anemia, History of blood transfusion (1984), Hypokalemia, Hypomagnesemia, Leukopenia, Neutropenia ([place]), NSVT (nonsustained ventricular tachycardia) ([place]), Tobacco abuse, and Transaminitis.   Surgical History:   Past Surgical History:  Procedure Laterality Date   ESOPHAGOGASTRODUODENOSCOPY N/A 10/29/2020   Procedure: ESOPHAGOGASTRODUODENOSCOPY (EGD);  Surgeon: Jonathon Bellows, MD;  Location: [facility] ENDOSCOPY;  Service: Gastroenterology;  [name];   ESOPHAGOGASTRODUODENOSCOPY (EGD) WITH PROPOFOL N/A 03/14/2020   Procedure: ESOPHAGOGASTRODUODENOSCOPY (EGD) WITH PROPOFOL;  Surgeon: Virgel Manifold, MD;  [name];  Service: Endoscopy;  [name];   ESOPHAGOGASTRODUODENOSCOPY (EGD) WITH PROPOFOL N/A 02/25/2021   Procedure: ESOPHAGOGASTRODUODENOSCOPY (EGD) WITH PROPOFOL;  Surgeon: Virgel Manifold, MD;  [name];  Service: Endoscopy;  [name];     Social History:   reports that she has been smoking cigarettes. She has a 2.50 pack-year smoking history. She has never used  smokeless tobacco. She reports current alcohol use of about 61.0 standard drinks of alcohol per week. She reports that she does not use drugs.   Family History:  Her family history includes Cancer in her paternal aunt and paternal aunt; Lupus in her mother; Prostate cancer in her father.   Allergies No Known Allergies   Home Medications  Prior to Admission medications   Medication Sig Start Date End Date Taking? Authorizing Provider  DEPO-PROVERA 150 MG/ML injection Inject 150 mg into the muscle every 3 (three) months. 03/22/22  Yes [provider]  DIFLUCAN 150 MG tablet Take by mouth. 03/22/22  Yes [provider]  mirtazapine (REMERON) 15 MG tablet Take 15 mg by mouth at bedtime. 08/12/21  Yes [provider]  pantoprazole (PROTONIX) 40 MG tablet Take 40 mg by mouth daily. 08/12/21  Yes [provider]  naltrexone (DEPADE) 50 MG tablet Take 50 mg by mouth daily. 03/22/22   [provider]  ondansetron (ZOFRAN-ODT) 8 MG disintegrating tablet Take 1 tablet (8 mg total) by mouth every 8 (eight) hours as needed for nausea or vomiting. Patient not taking: Reported on 03/30/2022 01/10/22   Naaman Plummer, MD  promethazine (PHENERGAN) 25 MG suppository Place 1 suppository (25 mg total) rectally every 6 (six) hours as needed for nausea or vomiting. Patient not taking: Reported on 03/30/2022 01/10/22   Nance Pear, MD     Critical care provider statement:   Total critical care time: 33 minutes   Performed by: Lanney Gins MD   Critical care time was exclusive of separately billable procedures and treating other patients.   Critical care was necessary to treat or prevent imminent or life-threatening deterioration.   Critical care was time spent personally by me on the following activities: development of treatment plan with patient and/or surrogate as well as nursing, discussions with consultants, evaluation of patient's response to treatment, examination  of patient, obtaining history from patient or surrogate, ordering and performing treatments and interventions, ordering and review of laboratory studies, ordering and review of radiographic studies, pulse oximetry and re-evaluation of patient's condition.    Ottie Glazier, M.D.  Pulmonary & Critical Care Medicine

## 2022-04-04 LAB — BASIC METABOLIC PANEL
Anion gap: 11 (ref 5–15)
BUN: <5 mg/dL — ABNORMAL LOW (ref 6–20)
CO2: 17 mmol/L — ABNORMAL LOW (ref 22–32)
Calcium: 8.6 mg/dL — ABNORMAL LOW (ref 8.9–10.3)
Chloride: 110 mmol/L (ref 98–111)
Creatinine, Ser: 0.64 mg/dL (ref 0.44–1.00)
GFR, Estimated: >60 mL/min (ref >60)
Glucose, Bld: 101 mg/dL — ABNORMAL HIGH (ref 70–99)
Potassium: 4.1 mmol/L (ref 3.5–5.1)
Sodium: 138 mmol/L (ref 135–145)

## 2022-04-04 LAB — CBC
HCT: 27.5 % — ABNORMAL LOW (ref 36.0–46.0)
Hemoglobin: 9.2 g/dL — ABNORMAL LOW (ref 12.0–15.0)
MCH: 26.9 pg (ref 26.0–34.0)
MCHC: 33.5 g/dL (ref 30.0–36.0)
MCV: 80.4 fL (ref 80.0–100.0)
Platelets: 146 10*3/uL — ABNORMAL LOW (ref 150–400)
RBC: 3.42 MIL/uL — ABNORMAL LOW (ref 3.87–5.11)
RDW: 16.3 % — ABNORMAL HIGH (ref 11.5–15.5)
WBC: 3.8 10*3/uL — ABNORMAL LOW (ref 4.0–10.5)
nRBC: 0 % (ref 0.0–0.2)

## 2022-04-04 LAB — D-DIMER, QUANTITATIVE: D-Dimer, Quant: 9.77 ug/mL-FEU — ABNORMAL HIGH (ref 0.00–0.50)

## 2022-04-04 LAB — GLUCOSE, CAPILLARY
Glucose-Capillary: 123 mg/dL — ABNORMAL HIGH (ref 70–99)
Glucose-Capillary: 86 mg/dL (ref 70–99)
Glucose-Capillary: 84 mg/dL (ref 70–99)

## 2022-04-04 LAB — FERRITIN: Ferritin: 22 ng/mL (ref 11–307)

## 2022-04-04 LAB — MAGNESIUM: Magnesium: 2 mg/dL (ref 1.7–2.4)

## 2022-04-04 LAB — C-REACTIVE PROTEIN: CRP: 1.8 mg/dL — ABNORMAL HIGH (ref <1.0)

## 2022-04-04 LAB — PHOSPHORUS: Phosphorus: 4.2 mg/dL (ref 2.5–4.6)

## 2022-04-04 NOTE — Progress Notes (Signed)
NAME:  Yvette [name], MRN:  [id number], DOB:  1980/11/29, LOS: 5 ADMISSION DATE:  03/30/2022, CONSULTATION DATE:  04/02/2022 REFERRING MD:  Dr. Chipper Herb, CHIEF COMPLAINT:  Severe DT's   Brief Pt Description / Synopsis:  41 yo AAF with end stage liver cirrhosis admitted to ICU for acute V. Fib Cardiac arrest, suspect secondary to R-on-T PVC in the setting of prolonged Qtc & severe hypomagnesemia/Hypokalemia, along with COVID 19 infection.  Course complicated by severe DT's.   04/03/22 - patient is improved but is still requiring levophed , weaned to .  She has periods of auditory hallucinations but overall withdrawal symptoms have improved. Patient got out of bed and walked to commode today.   Will optimize medically for [facility] in AM  04/04/22- patient is improved and will be optimized for transfer to [facility].  PT/OT today. No longer on vasopressors.   History of Present Illness:  41 yo F presenting to [facility] ED from home via EMS on 03/30/22 with complaints of nausea/vomiting since midnight 9/19. History obtained via ED documentation, supplemented by father Yvette [name] via phone interview. She reported in triage that she had taken medication without relief and was still vomiting clear liquid. She denied abdominal pain. Her father reported that she had complained of a cold symptoms over the last few days, otherwise was in her normal state of health. She told ED staff that her last drink was Sunday day.   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 Noted during admission 06/2021 assoc w/ palpitations and presyncope.   Tobacco abuse    Transaminitis     Micro Data:  9/19: SARS-CoV-2 PCR>>  positive 9/19: MRSA PCR>> negative 9/22: Blood culture x2>>  Antimicrobials:  Unasyn 9/20>> 9/20  Significant Hospital Events: Including procedures, antibiotic start and stop dates in addition to other pertinent events   03/30/22: Admit to ICU post acute respiratory arrest leading to cardiac arrest and mechanical intubation requiring ventilatory support. (9 minutes downtime, received 1 defibrillation & 1 synchronized  cardioversion. Normothermia protocol implemented. 03/31/22: Cardiology consulted. EXTUBATED. 04/01/22: TRH assumed care.  Overnight developed severe DT's requiring Precedex gtt. 04/02/22: Ongoing severe DT's.  Bradycardia with precedex, transient episode of hypotension briefly requiring Levophed.  PCCM consulted.  Precedex held, placed on scheduled Valium.   Objective   Blood pressure (!) 91/56, pulse (!) 105, temperature 98.4 F (36.9 C), temperature source Oral, resp. rate 19, height 5\' 2"  (1.575 m), weight 49.2 kg, SpO2 97 %.    FiO2 (%):  [21 %] 21 %   Intake/Output Summary (Last 24 hours) at 04/04/2022 1113 Last data filed at 04/04/2022 0900 Gross per 24 hour  Intake 1981.06 ml  Output 300 ml  Net 1681.06 ml    Filed Weights   04/02/22 0620 04/03/22 0500 04/04/22 0333  Weight: 48.7 kg 49.8 kg 49.2 kg    Examination: General: Acute on chronically ill-appearing female in NAD HENT: Atraumatic, normocephalic, neck supple, no JVD Lungs: Clear diminished breath sounds bilaterally, even, nonlabored Cardiovascular: Tachycardia, regular rhythm, S1-S2, no murmurs, rubs, gallops Abdomen: Soft, nontender, nondistended, no  guarding rebound tenderness, bowel sounds positive x4 Extremities: Normal bulk and tone, no deformities, no edema Neuro: no FND grossly able to follow commands, pupils PERRLA GU: Deferred   Assessment & Plan:   #Acute Metabolic Encephalopathy #Severe DT's PMHx: Polysubstance abuse -Provide supportive care -Seizure and aspiration precautions -CIWA  Protocol -Scheduled and prn benzo's -Thiamine, MVI, and folic acid  #Acute Hypoxic Respiratory Failure in setting of cardiac arrest, COVID-19 Pneumonia, Query Aspiration EXTUBATED 9/20 -Supplemental O2 as needed to maintain O2 sats 90% -Currently protecting airway, but high risk for reintubation -Follow intermittent Chest X-ray & ABG as needed -Bronchodilators & Pulmicort nebs -IV Steroids -ABX as above -Pulmonary toilet as able  #V. Fib cardiac arrest, suspect due to prolonged QTc along with multiple metabolic derangements #Dilated Cardiomyopathy -Continuous cardiac monitoring -Maintain MAP >65 -Vasopressors as needed to maintain MAP goal -Trend HS Troponin until peaked -Echocardiogram 03/31/22: LVEF 30-40% -Cardiology following, appreciate input -Avoid QT prolonging medications -Aggressive electrolyte repletion  #Mild Leukopenia #COVID-19 Infection #Query aspiration -Monitor fever curve -Trend WBC's and inflammatory markers  -Follow cultures as above -ABX discontinued as PCT negative x3 -To complete course of Molnupiravir -Vitamin C and Zinc -Maintain airborne and contact precautions  #Hypokalemia ~ RESOLVED #Hypomagnesemia ~ RESOLVED #Hypophosphatemia ~ RESOLVED -Monitor I&O's / urinary output -Follow BMP -Ensure adequate renal perfusion -Avoid nephrotoxic agents as able -Replace electrolytes as indicated -Pharmacy following for assistance with electrolyte replacement  #Alcohol Cirrhosis -Trend LFT's and coags -Encourage ETOH cessation       Best Practice (right click and "Reselect all SmartList Selections" daily)   Diet/type: NPO DVT prophylaxis: LMWH GI prophylaxis: N/A Lines: Central line and yes and it is still needed Foley:  N/A Code Status:  full code Last date of multidisciplinary goals of care discussion [04/02/22]  Labs   CBC: Recent Labs  Lab 03/30/22 1242 03/30/22 2154 04/02/22 1056 04/03/22 0523  WBC 3.6* 6.1 3.4* 3.0*  NEUTROABS   --  5.4 2.3  --   HGB 11.8* 9.8* 10.9* 9.7*  HCT 36.6 30.1* 34.1* 30.2*  MCV 80.8 82.0 82.4 82.3  PLT 137* 105* 116* 116*     Basic Metabolic Panel: Recent Labs  Lab 03/31/22 1455 04/01/22 0804 04/02/22 1012 04/02/22 1056 04/03/22 0523 04/04/22 0630  NA  --  140 141 141 137 138  K 3.6 3.5 4.5 5.0 4.0 4.1  CL  --  102 109 108 106 110  CO2  --  25 24 26  21* 17*  GLUCOSE  --  84 100* 124* 104* 101*  BUN  --  5* 8 8 5* <5*  CREATININE  --  0.68 0.50 0.66 0.58 0.64  CALCIUM  --  8.3* 8.8* 8.8* 8.3* 8.6*  MG 2.5* 1.9 2.0  --  1.4* 2.0  PHOS 5.0* 2.2* 4.2  --  4.9* 4.2    GFR: Estimated Creatinine Clearance: 74.1 mL/min (by C-G formula based on SCr of 0.64 mg/dL). Recent Labs  Lab 03/30/22 1242 03/30/22 1246 03/30/22 2021 03/30/22 2154 03/31/22 0209 04/01/22 0804 04/02/22 1056 04/03/22 0523  PROCALCITON  --  <0.10  --   --  <0.10 <0.10  --   --   WBC 3.6*  --   --  6.1  --   --  3.4* 3.0*  LATICACIDVEN  --   --  1.9 3.6* 1.7  --   --   --      Liver Function Tests: Recent Labs  Lab 03/30/22 1242 03/30/22 2154 04/02/22 1056  AST  229* 424* 64*  ALT 80* 96* 53*  ALKPHOS 109 80 86  BILITOT 0.8 0.8 0.5  PROT 9.1* 6.7 6.9  ALBUMIN 4.9 3.4* 3.2*    Recent Labs  Lab 03/30/22 1242  LIPASE 22    No results for input(s): "AMMONIA" in the last 168 hours.  ABG    Component Value Date/Time   PHART 7.52 (H) 03/31/2022 0511   PCO2ART 28 (L) 03/31/2022 0511   PO2ART 99 03/31/2022 0511   HCO3 22.9 03/31/2022 0511   ACIDBASEDEF 1.4 03/30/2022 2345   O2SAT 98 03/31/2022 0511     Coagulation Profile: Recent Labs  Lab 03/30/22 2021 03/30/22 2154  INR 1.2 1.2     Cardiac Enzymes: No results for input(s): "CKTOTAL", "CKMB", "CKMBINDEX", "TROPONINI" in the last 168 hours.  HbA1C: Hgb A1c MFr Bld  Date/Time Value Ref Range Status  04/01/2022 07:47 AM 5.6 4.8 - 5.6 % Final    Comment:    (NOTE) Pre diabetes:          5.7%-6.4%  Diabetes:               >6.4%  Glycemic control for   <7.0% adults with diabetes   03/30/2022 12:30 PM 12.5 (H) 4.8 - 5.6 % Final    Comment:    (NOTE) Pre diabetes:          5.7%-6.4%  Diabetes:              >6.4%  Glycemic control for   <7.0% adults with diabetes     CBG: Recent Labs  Lab 04/03/22 1708 04/03/22 2005 04/03/22 2348 04/04/22 0430 04/04/22 0717  GLUCAP 87 73 125* 123* 86     Review of Systems:   Unable to assess due to AMS/sedation/critical illness   Past Medical History:  She,  has a past medical history of Asthma, ETOH abuse, H/O Kawasaki's disease, History of anemia, History of blood transfusion (1984), Hypokalemia, Hypomagnesemia, Leukopenia, Neutropenia (HCC), NSVT (nonsustained ventricular tachycardia) (HCC), Tobacco abuse, and Transaminitis.   Surgical History:   Past Surgical History:  Procedure Laterality Date   ESOPHAGOGASTRODUODENOSCOPY N/A 10/29/2020   Procedure: ESOPHAGOGASTRODUODENOSCOPY (EGD);  Surgeon: Wyline Mood, MD;  Location: [facility] ENDOSCOPY;  Service: Gastroenterology;  [name];   ESOPHAGOGASTRODUODENOSCOPY (EGD) WITH PROPOFOL N/A 03/14/2020   Procedure: ESOPHAGOGASTRODUODENOSCOPY (EGD) WITH PROPOFOL;  Surgeon: Pasty Spillers, MD;  [name];  Service: Endoscopy;  [name];   ESOPHAGOGASTRODUODENOSCOPY (EGD) WITH PROPOFOL N/A 02/25/2021   Procedure: ESOPHAGOGASTRODUODENOSCOPY (EGD) WITH PROPOFOL;  Surgeon: Pasty Spillers, MD;  [name];  Service: Endoscopy;  [name];     Social History:   reports that she has been smoking cigarettes. She has a 2.50 pack-year smoking history. She has never used smokeless tobacco. She reports current alcohol use of about 61.0 standard drinks of alcohol per week. She reports that she does not use drugs.   Family History:  Her family history includes Cancer in her paternal aunt and paternal aunt; Lupus in her mother; Prostate cancer in her father.    Allergies No Known Allergies   Home Medications  Prior to Admission medications   Medication Sig Start Date End Date Taking? Authorizing Provider  DEPO-PROVERA 150 MG/ML injection Inject 150 mg into the muscle every 3 (three) months. 03/22/22  Yes [provider]  DIFLUCAN 150 MG tablet Take by mouth. 03/22/22  Yes [provider]  mirtazapine (REMERON) 15 MG tablet Take 15 mg by mouth at bedtime. 08/12/21  Yes [provider]  pantoprazole (PROTONIX) 40 MG tablet Take 40 mg by mouth daily. 08/12/21  Yes [provider]  naltrexone (DEPADE) 50 MG tablet Take 50 mg by mouth daily. 03/22/22   [provider]  ondansetron (ZOFRAN-ODT) 8 MG disintegrating tablet Take 1 tablet (8 mg total) by mouth every 8 (eight) hours as needed for nausea or vomiting. Patient not taking: Reported on 03/30/2022 01/10/22   Naaman Plummer, MD  promethazine (PHENERGAN) 25 MG suppository Place 1 suppository (25 mg total) rectally every 6 (six) hours as needed for nausea or vomiting. Patient not taking: Reported on 03/30/2022 01/10/22   Nance Pear, MD     Critical care provider statement:   Total critical care time: 33 minutes   Performed by: Lanney Gins MD   Critical care time was exclusive of separately billable procedures and treating other patients.   Critical care was necessary to treat or prevent imminent or life-threatening deterioration.   Critical care was time spent personally by me on the following activities: development of treatment plan with patient and/or surrogate as well as nursing, discussions with consultants, evaluation of patient's response to treatment, examination of patient, obtaining history from patient or surrogate, ordering and performing treatments and interventions, ordering and review of laboratory studies, ordering and review of radiographic studies, pulse oximetry and re-evaluation of patient's condition.    Ottie Glazier, M.D.   Pulmonary & Critical Care Medicine

## 2022-04-04 NOTE — Plan of Care (Signed)
Continuing with plan of care. 

## 2022-04-04 NOTE — Progress Notes (Signed)
At 1300 safety sitter discontinued and bed alarm placed on patient, patient given call bell and instructed on how to use it, patient able to verbalize education, patient able to verbalize needs.

## 2022-04-04 NOTE — Progress Notes (Signed)
PHARMACY CONSULT NOTE - FOLLOW UP  Pharmacy Consult for Electrolyte Monitoring and Replacement   Recent Labs: Potassium (mmol/L)  Date Value  04/04/2022 4.1   Magnesium (mg/dL)  Date Value  04/04/2022 2.0   Calcium (mg/dL)  Date Value  04/04/2022 8.6 (L)   Albumin (g/dL)  Date Value  04/02/2022 3.2 (L)  02/05/2021 4.9 (H)   Phosphorus (mg/dL)  Date Value  04/04/2022 4.2   Sodium (mmol/L)  Date Value  04/04/2022 138  02/05/2021 141     Assessment: 41 y/o female with h/o Kawasaki's disease, etoh abuse and cirrhosis who is admitted with COVID 19 complicated by cardiac arrest with ventricular fibrillation and alcohol withdrawal. Pharmacy is asked to follow and replace electrolytes while in CCU   Goal of Therapy:  Potassium 4.0 - 5.1 mmol/L Magnesium 2.0 - 2.4 mg/dL All Other Electrolytes WNL  Plan:  --no electrolyte replacement warranted for today --recheck electrolytes in am  Dallie Piles ,PharmD Clinical Pharmacist 04/04/2022 8:23 AM

## 2022-04-05 DIAGNOSIS — R571 Hypovolemic shock: Secondary | ICD-10-CM

## 2022-04-05 DIAGNOSIS — R45851 Suicidal ideations: Secondary | ICD-10-CM

## 2022-04-05 DIAGNOSIS — U071 COVID-19: Secondary | ICD-10-CM | POA: Diagnosis not present

## 2022-04-05 DIAGNOSIS — I5022 Chronic systolic (congestive) heart failure: Secondary | ICD-10-CM | POA: Diagnosis not present

## 2022-04-05 DIAGNOSIS — F10931 Alcohol use, unspecified with withdrawal delirium: Secondary | ICD-10-CM

## 2022-04-05 DIAGNOSIS — J9601 Acute respiratory failure with hypoxia: Secondary | ICD-10-CM | POA: Diagnosis not present

## 2022-04-05 DIAGNOSIS — I4901 Ventricular fibrillation: Secondary | ICD-10-CM | POA: Diagnosis not present

## 2022-04-05 LAB — CBC
HCT: 29.4 % — ABNORMAL LOW (ref 36.0–46.0)
Hemoglobin: 9.6 g/dL — ABNORMAL LOW (ref 12.0–15.0)
MCH: 26.4 pg (ref 26.0–34.0)
MCHC: 32.7 g/dL (ref 30.0–36.0)
MCV: 80.8 fL (ref 80.0–100.0)
Platelets: 173 10*3/uL (ref 150–400)
RBC: 3.64 MIL/uL — ABNORMAL LOW (ref 3.87–5.11)
RDW: 16.7 % — ABNORMAL HIGH (ref 11.5–15.5)
WBC: 2.8 10*3/uL — ABNORMAL LOW (ref 4.0–10.5)
nRBC: 0 % (ref 0.0–0.2)

## 2022-04-05 LAB — PHOSPHORUS: Phosphorus: 4.4 mg/dL (ref 2.5–4.6)

## 2022-04-05 LAB — BASIC METABOLIC PANEL
Anion gap: 7 (ref 5–15)
BUN: 7 mg/dL (ref 6–20)
CO2: 24 mmol/L (ref 22–32)
Calcium: 9 mg/dL (ref 8.9–10.3)
Chloride: 106 mmol/L (ref 98–111)
Creatinine, Ser: 0.71 mg/dL (ref 0.44–1.00)
GFR, Estimated: >60 mL/min (ref >60)
Glucose, Bld: 105 mg/dL — ABNORMAL HIGH (ref 70–99)
Potassium: 4.3 mmol/L (ref 3.5–5.1)
Sodium: 137 mmol/L (ref 135–145)

## 2022-04-05 LAB — MAGNESIUM: Magnesium: 1.5 mg/dL — ABNORMAL LOW (ref 1.7–2.4)

## 2022-04-05 MED ORDER — MAGNESIUM SULFATE 4 GM/100ML IV SOLN
4.0000 g | Freq: Once | INTRAVENOUS | Status: AC
  Administered 2022-04-05: 4 g via INTRAVENOUS
  Filled 2022-04-05: qty 100

## 2022-04-05 MED ORDER — ENSURE ENLIVE PO LIQD: 237.0000 mL | Freq: Two times a day (BID) | ORAL | Status: DC

## 2022-04-05 MED ORDER — MAGNESIUM SULFATE 2 GM/50ML IV SOLN: 2.0000 g | Freq: Once | INTRAVENOUS | Status: DC

## 2022-04-05 NOTE — Progress Notes (Addendum)
PHARMACY CONSULT NOTE - FOLLOW UP  Pharmacy Consult for Electrolyte Monitoring and Replacement   Recent Labs: Potassium (mmol/L)  Date Value  04/05/2022 4.3   Magnesium (mg/dL)  Date Value  04/05/2022 1.5 (L)   Calcium (mg/dL)  Date Value  04/05/2022 9.0   Albumin (g/dL)  Date Value  04/02/2022 3.2 (L)  02/05/2021 4.9 (H)   Phosphorus (mg/dL)  Date Value  04/05/2022 4.4   Sodium (mmol/L)  Date Value  04/05/2022 137  02/05/2021 141     Assessment: 41 y/o female with h/o Kawasaki's disease, etoh abuse and cirrhosis who is admitted with COVID 19 complicated by cardiac arrest with ventricular fibrillation and alcohol withdrawal. Pharmacy is asked to follow and replace electrolytes.   9/21 Mag 1.9 given Magnesium 2 grams x 1 9/22 Mag 2.0 9/23 Mag 1.4, given Magnesium 4 grams x 1 9/24 Mag 2.0  Goal of Therapy:  Potassium 4.0 - 5.1 mmol/L Magnesium 2.0 - 2.4 mg/dL All Other Electrolytes WNL  Plan: Magnesium is low today at 1.5 --Give Magnesium 4 grams x 1 --recheck electrolytes in am --Managing electrolytes with aggressive repletion given presentation with CV arrest and QT prolongation this admission.   Glean Salvo, PharmD Clinical Pharmacist  04/05/2022 9:01 AM

## 2022-04-05 NOTE — Progress Notes (Signed)
Progress Note   Patient: Yvette [name] VQQ:[id number] DOB: 1981-06-04 DOA: 03/30/2022     6 DOS: the patient was seen and examined on 04/05/2022   Brief hospital course: Patient is a 41 year old female with history of Kawasaki syndrome, alcohol abuse and alcoholic liver cirrhosis who present to the hospital with nausea vomiting.  She was found to have possible COVID.  She was giving a dose of droperidol.  She subsequently developed cardiac arrest with ventricular fibrillation, then ventricular tachycardia.  She was cardioverted.  She was intubated for acute respiratory failure. Her condition has improved, she was transferred to the medical floor on 9/20.  Patient was transferred back to ICU on 9/22 due to severe agitation requiring Precedex for alcohol withdrawal.  She also had a low blood pressure requiring pressor. Transferred back to medical floor on 9/24.  Assessment and Plan: Hypovolemic shock. Delirium treatments. Patient developed severe agitation and confusion after initial recovery from cardiac arrest.  She had a severe delirium tremens.  She was placed on Precedex, CIWA protocol. She also had a significant drop in blood pressure, requiring pressor.  This is due to multifactorial factors including systolic congestive heart failure, beta-blocker as well as hypovolemia. All condition has improved.  Cardiac arrest with ventricular fibrillation. Long QT interval. Severe hypomagnesemia  Severe hypokalemia. Severe hypophosphatemia. Chronic systolic congestive heart failure. Alcoholic cardiomyopathy Myocardial injury secondary to cardiac arrest. Patient condition has improved.  Has been seen by cardiology, etiology of cardiac arrest appears to be due to systolic dysfunction, electrolytes abnormality in the setting of a long QT interval.  Droperidol may further prolong QT interval, triggered cardiac arrest.  She also had a positive COVID. Echocardiogram showed ejection fraction 35 to 40%.   Troponin 101, appears to be due to cardiac arrest instead of true coronary disease. Patient condition seem to be improved, discussed with cardiology, may consider start low-dose beta-blocker again.  I will try to keep patient for 1 more day before discharge to make sure she can tolerate the treatment.   Acute hypoxemic respiratory failure secondary to cardiac arrest. Condition has improved.   Alcohol abuse. Alcoholic liver cirrhosis. Advised to quit.    Covid infection  a course of Molnupiravir completed.  Suicidal ideation. Patient had a suicide ideation at the onset of DVT, being seen by psychiatry, no additional risk.      Subjective:  Patient doing well today, currently has no complaints.  Physical Exam: Vitals:   04/04/22 2339 04/05/22 0210 04/05/22 0530 04/05/22 0837  BP: 103/67  (!) 104/58 92/61  Pulse: 81  (!) 102 (!) 105  Resp: 16  15 16   Temp: 98.5 F (36.9 C)  98.3 F (36.8 C) 98.7 F (37.1 C)  TempSrc: Oral  Oral   SpO2: 100%  99% 99%  Weight:  51.4 kg    Height:       General exam: Appears calm and comfortable  Respiratory system: Clear to auscultation. Respiratory effort normal. Cardiovascular system: S1 & S2 heard, RRR. No JVD, murmurs, rubs, gallops or clicks. No pedal edema. Gastrointestinal system: Abdomen is nondistended, soft and nontender. No organomegaly or masses felt. Normal bowel sounds heard. Central nervous system: Alert and oriented. No focal neurological deficits. Extremities: Symmetric 5 x 5 power. Skin: No rashes, lesions or ulcers Psychiatry: Judgement and insight appear normal. Mood & affect appropriate.   Data Reviewed:  Lab results reviewed.  Family Communication: father and mother updated  Disposition: Status is: Inpatient Remains inpatient appropriate because: Severity of  disease.  Planned Discharge Destination: Home    Time spent: 55 minutes  Author: Sharen Hones, MD 04/05/2022 9:56 AM  For on call review  [email or web address].

## 2022-04-05 NOTE — Progress Notes (Signed)
Rounding Note    Patient Name: Yvette [name] Date of Encounter: 04/05/2022  [facility] Cardiologist: Lorine Bears, MD   Subjective   Patient seen on rounds. Denies any chest pain but continues with exertional shortness of breath.  Continued to have soft blood pressures. Transferred out of ICU on 04/04/22.  Inpatient Medications    Scheduled Meds:  vitamin C  500 mg Oral Daily   Chlorhexidine Gluconate Cloth  6 each Topical Q0600   diazepam  2.5 mg Intravenous Q6H   enoxaparin (LOVENOX) injection  40 mg Subcutaneous Q24H   feeding supplement  237 mL Oral BID BM   folic acid  1 mg Oral Daily   multivitamin with minerals  1 tablet Oral Daily   nicotine  14 mg Transdermal Daily   sodium chloride flush  3 mL Intravenous Q12H   [START ON 04/07/2022] thiamine (VITAMIN B1) injection  100 mg Intravenous Daily   zinc sulfate  220 mg Oral Daily   Continuous Infusions:  sodium chloride     sodium chloride Stopped (04/05/22 0534)   thiamine (VITAMIN B1) injection     PRN Meds: sodium chloride, docusate sodium, LORazepam, mouth rinse, mouth rinse, polyethylene glycol, sodium chloride flush   Vital Signs    Vitals:   04/05/22 0530 04/05/22 0837 04/05/22 1119 04/05/22 1519  BP: (!) 104/58 92/61 108/68 (!) 94/55  Pulse: (!) 102 (!) 105 (!) 107 96  Resp: 15 16 18 16   Temp: 98.3 F (36.8 C) 98.7 F (37.1 C) 98.6 F (37 C) 98.5 F (36.9 C)  TempSrc: Oral Oral    SpO2: 99% 99% 96% 100%  Weight:      Height:        Intake/Output Summary (Last 24 hours) at 04/05/2022 1656 Last data filed at 04/05/2022 0600 Gross per 24 hour  Intake 872.17 ml  Output 150 ml  Net 722.17 ml      04/05/2022    2:10 AM 04/04/2022   10:14 PM 04/04/2022    3:33 AM  Last 3 Weights  Weight (lbs) 113 lb 5.1 oz 113 lb 5.1 oz 108 lb 7.5 oz  Weight (kg) 51.4 kg 51.4 kg 49.2 kg      Telemetry    Sinus to sinus tachycardia - Personally Reviewed  ECG    No new tracings - Personally  Reviewed  Physical Exam   GEN: No acute distress.   Neck: No JVD Cardiac: RRR, no murmurs, rubs, or gallops.  Respiratory: Clear to auscultation bilaterally. Respirations are unlabored on room air. GI: Soft, nontender, non-distended  MS: No edema; No deformity. Neuro:  Nonfocal  Psych: Normal affect   Labs    High Sensitivity Troponin:   Recent Labs  Lab 03/30/22 1242 03/30/22 2021 03/30/22 2154 03/31/22 0209 03/31/22 0608  TROPONINIHS 5 24* 62* 101* 90*     Chemistry Recent Labs  Lab 03/30/22 1242 03/30/22 1246 03/30/22 2154 03/31/22 0209 04/02/22 1056 04/03/22 0523 04/04/22 0630 04/05/22 0655  NA 139  --  140   < > 141 137 138 137  K 3.4*  --  3.4*   < > 5.0 4.0 4.1 4.3  CL 100  --  106   < > 108 106 110 106  CO2 22  --  19*   < > 26 21* 17* 24  GLUCOSE 150*  --  170*   < > 124* 104* 101* 105*  BUN 8  --  7   < > 8  5* <5* 7  CREATININE 0.81  --  0.87   < > 0.66 0.58 0.64 0.71  CALCIUM 9.3  --  7.3*   < > 8.8* 8.3* 8.6* 9.0  MG  --    < > 1.2*   < >  --  1.4* 2.0 1.5*  PROT 9.1*  --  6.7  --  6.9  --   --   --   ALBUMIN 4.9  --  3.4*  --  3.2*  --   --   --   AST 229*  --  424*  --  64*  --   --   --   ALT 80*  --  96*  --  53*  --   --   --   ALKPHOS 109  --  80  --  86  --   --   --   BILITOT 0.8  --  0.8  --  0.5  --   --   --   GFRNONAA >60  --  >60   < > >60 >60 >60 >60  ANIONGAP 17*  --  15   < > 7 10 11 7    < > = values in this interval not displayed.    Lipids  Recent Labs  Lab 03/31/22 0209  TRIG 89    Hematology Recent Labs  Lab 04/03/22 0523 04/04/22 1216 04/05/22 0655  WBC 3.0* 3.8* 2.8*  RBC 3.67* 3.42* 3.64*  HGB 9.7* 9.2* 9.6*  HCT 30.2* 27.5* 29.4*  MCV 82.3 80.4 80.8  MCH 26.4 26.9 26.4  MCHC 32.1 33.5 32.7  RDW 16.7* 16.3* 16.7*  PLT 116* 146* 173   Thyroid No results for input(s): "TSH", "FREET4" in the last 168 hours.  BNP Recent Labs  Lab 03/30/22 1246  BNP 11.2    DDimer  Recent Labs  Lab 04/02/22 0509  04/03/22 0523 04/04/22 0630  DDIMER 2.31* >20.00* 9.77*     Radiology    No results found.  Cardiac Studies  TTE 03/31/22 1. Left ventricular ejection fraction, by estimation, is 35 to 40%. The  left ventricle has moderately decreased function. The left ventricle  demonstrates regional wall motion abnormalities (see scoring  diagram/findings for description). Left ventricular   diastolic function could not be evaluated. There is severe hypokinesis of  the left ventricular, entire anteroseptal wall and anterior wall.   2. Right ventricular systolic function is normal. The right ventricular  size is normal.   3. The mitral valve is normal in structure. No evidence of mitral valve  regurgitation.   4. The aortic valve is tricuspid. Aortic valve regurgitation is not  visualized. Aortic valve gradient could not be assessed due to limited  acoustic windows.   Patient Profile     41 y.o. 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   Assessment & Plan    Ventricular fibrillation/ cardiac arrest -history of ventricular fibrillation exacerbated by electrolyte abnormality -no repeat episodes noted on telemetry monitoring -electrolyte abnormalities corrected -daily bmp -monitor/trend/replete as needed -not a candidate for ICD given ongoing alcohol abuse -continue to avoid QT prolonging medications -stress testing in 3/23 revealed normal results, no further ischemic work-up at this time  Alcohol withdrawal -long history of alcohol abuse -required precedex while in ICU that was held due to bradycardia  Dilated cardiomyopathy -  LVEF 30-40% -last TTE LVEF 50-55% in 12/22 -underlying COVID and alcohol abuse -not a good candidate for ischemic work-up -coreg remains on hold due to soft blood  pressures -GDMT as blood pressure and heart rate allows -euvolemic on exam -will consider restarting low dose metoprolol tartrate in AM  COVID 19 infection -no current respiratory distress -remains on room air  5.  Tobacco abuse -recommend total cessation -continue with Nicotine patches   For questions or updates, please contact [facility] Please consult [email or web address] for contact info under        Signed, Mikaia Janvier, NP  04/05/2022, 4:56 PM

## 2022-04-05 NOTE — Progress Notes (Signed)
PT Cancellation Note  Patient Details Name: Yvette [name] MRN: [id number] DOB: [birth year]   Cancelled Treatment:    Reason Eval/Treat Not Completed: PT screened, no needs identified, will sign off PT orders received, chart reviewed. Per OT, pt is ambulatory without AD with independence. No acute PT needs identified at this time. PT to complete current orders.   Lavone Nian, PT, DPT 04/05/22, 11:20 AM   Waunita Schooner 04/05/2022, 11:18 AM

## 2022-04-05 NOTE — Progress Notes (Signed)
Nutrition Follow-up  DOCUMENTATION CODES:   Not applicable  INTERVENTION:   -Ensure Enlive po BID, each supplement provides 350 kcal and 20 grams of protein -MVI with minerals daily  NUTRITION DIAGNOSIS:   Increased nutrient needs related to catabolic illness (COVID 19, cirrhosis) as evidenced by estimated needs.  Ongoing  GOAL:   Patient will meet greater than or equal to 90% of their needs  Progressing   MONITOR:   PO intake, Supplement acceptance  REASON FOR ASSESSMENT:   Rounds    ASSESSMENT:   41 y/o female with h/o Kawasaki's disease, etoh abuse and cirrhosis who is admitted with COVID 19 complicated by cardiac arrest with ventricular fibrillation and alcohol withdrawal.  9/20- advanced to regular diet, NGT placement held 2/2 agitation 9/23- restraints removed 9/24- safety sitter d/c, transferred to medical floor  Reviewed I/O's: +325 ml x 24 hours and +2.3 L since admission  UOP: 775 ml x 24 hours   Pt unavailable at time of visit. Attempted to speak with pt via call to hospital room phone, however, unable to reach.   Pt remains on regular diet. Noted meal completions 90-100%. Pt with increased nutritional needs for acute illness and would benefit from addition of oral nutrition supplements.   Per MD notes, plan to possibly discharge tomorrow if able to tolerate low-dose beta blocker.   Medications reviewed and include vitamin C, valium, folic acid, vitamin B-1, and zinc sulfate.   Labs reviewed: CBGS: 84-123 (inpatient orders for glycemic control are none).    Diet Order:   Diet Order             Diet regular Room service appropriate? Yes; Fluid consistency: Thin  Diet effective now                   EDUCATION NEEDS:   Not appropriate for education at this time  Skin:  Skin Assessment: Reviewed RN Assessment  Last BM:  04/04/22  Height:   Ht Readings from Last 1 Encounters:  04/04/22 5' 1.5" (1.562 m)    Weight:   Wt Readings  from Last 1 Encounters:  04/05/22 51.4 kg    Ideal Body Weight:  50 kg  BMI:  Body mass index is 21.06 kg/m.  Estimated Nutritional Needs:   Kcal:  1600-1800  Protein:  85-100 grams  Fluid:  > 1.6 L    Loistine Chance, RD, LDN, [place] Registered Dietitian II Certified Diabetes Care and Education Specialist Please refer to [facility] for RD and/or RD on-call/weekend/after hours pager

## 2022-04-05 NOTE — Evaluation (Signed)
Occupational Therapy Evaluation Patient Details Name: Yvette [name] MRN: [id number] DOB: 1981-06-07 Today's Date: 04/05/2022   History of Present Illness 41 yo F presenting to [facility] ED from home via EMS on 03/30/22 with complaints of nausea/vomiting since midnight 9/19.   Clinical Impression   Patient presenting with decreased independence in safety, functional mobility, and endurance. Pt supine in bed and agreeable to OT services.  Patient A&O x4. Pt reports she lives at home with her husband and 15 y/o daughter. Pt reports she's independent at baseline. Pt currently functioning independent with bed mobility, transfers from supine <> sit, STS, and toilet transfers. Pt was able to ambulate  ~200 independently around nursing station. OT treatment not recommended at this time. OT to complete order.    Recommendations for follow up therapy are one component of a multi-disciplinary discharge planning process, led by the attending physician.  Recommendations may be updated based on patient status, additional functional criteria and insurance authorization.   Follow Up Recommendations  No OT follow up    Assistance Recommended at Discharge None  Patient can return home with the following Other (comment) (none)    Functional Status Assessment  Patient has had a recent decline in their functional status and demonstrates the ability to make significant improvements in function in a reasonable and predictable amount of time.  Equipment Recommendations  None recommended by OT    Recommendations for Other Services       Precautions / Restrictions Precautions Precautions: Fall Restrictions Weight Bearing Restrictions: No      Mobility Bed Mobility Overal bed mobility: Independent                  Transfers Overall transfer level: Independent Equipment used: None                      Balance Overall balance assessment: Independent                                          ADL either performed or assessed with clinical judgement   ADL Overall ADL's : Independent                                              Pertinent Vitals/Pain Pain Assessment Pain Assessment: No/denies pain     Hand Dominance     Extremity/Trunk Assessment Upper Extremity Assessment Upper Extremity Assessment: Overall WFL for tasks assessed   Lower Extremity Assessment Lower Extremity Assessment: Overall WFL for tasks assessed       Communication Communication Communication: No difficulties   Cognition Arousal/Alertness: Awake/alert Behavior During Therapy: WFL for tasks assessed/performed Overall Cognitive Status: Within Functional Limits for tasks assessed                                 General Comments: Pt A&O x4                Home Living Family/patient expects to be discharged to:: Private residence Living Arrangements: Spouse/significant other;Children Available Help at Discharge: Family Type of Home: House       Home Layout: Two level Alternate Level Stairs-Number of Steps: 12   Bathroom Shower/Tub: Tub/shower unit  Bathroom Toilet: Standard     Home Equipment: None          Prior Functioning/Environment Prior Level of Function : Independent/Modified Independent                                 OT Goals(Current goals can be found in the care plan section) Acute Rehab OT Goals Patient Stated Goal: to return home OT Goal Formulation: With patient Time For Goal Achievement: 04/05/22 Potential to Achieve Goals: Good   AM-PAC OT "6 Clicks" Daily Activity     Outcome Measure Help from another person eating meals?: None Help from another person taking care of personal grooming?: None Help from another person toileting, which includes using toliet, bedpan, or urinal?: None Help from another person bathing (including washing, rinsing, drying)?: None Help from another person to  put on and taking off regular upper body clothing?: None Help from another person to put on and taking off regular lower body clothing?: None 6 Click Score: 24   End of Session Nurse Communication: Mobility status  Activity Tolerance: Patient tolerated treatment well Patient left: in bed;with call bell/phone within reach;with bed alarm set                   Time: 0349-1791 OT Time Calculation (min): 18 min Charges:  OT General Charges $OT Visit: 1 Visit OT Evaluation $OT Eval Low Complexity: 1 Low OT Treatments $Therapeutic Activity: 8-22 mins    Eman Morimoto, OTS 04/05/2022, 1:11 PM

## 2022-04-06 DIAGNOSIS — U071 COVID-19: Secondary | ICD-10-CM | POA: Diagnosis not present

## 2022-04-06 DIAGNOSIS — I5022 Chronic systolic (congestive) heart failure: Secondary | ICD-10-CM | POA: Diagnosis not present

## 2022-04-06 DIAGNOSIS — J9601 Acute respiratory failure with hypoxia: Secondary | ICD-10-CM | POA: Diagnosis not present

## 2022-04-06 DIAGNOSIS — I4901 Ventricular fibrillation: Secondary | ICD-10-CM | POA: Diagnosis not present

## 2022-04-06 LAB — CBC
HCT: 27 % — ABNORMAL LOW (ref 36.0–46.0)
Hemoglobin: 8.9 g/dL — ABNORMAL LOW (ref 12.0–15.0)
MCH: 26.6 pg (ref 26.0–34.0)
MCHC: 33 g/dL (ref 30.0–36.0)
MCV: 80.6 fL (ref 80.0–100.0)
Platelets: 209 10*3/uL (ref 150–400)
RBC: 3.35 MIL/uL — ABNORMAL LOW (ref 3.87–5.11)
RDW: 16.5 % — ABNORMAL HIGH (ref 11.5–15.5)
WBC: 3 10*3/uL — ABNORMAL LOW (ref 4.0–10.5)
nRBC: 0 % (ref 0.0–0.2)

## 2022-04-06 LAB — BASIC METABOLIC PANEL
Anion gap: 6 (ref 5–15)
BUN: 6 mg/dL (ref 6–20)
CO2: 23 mmol/L (ref 22–32)
Calcium: 8.5 mg/dL — ABNORMAL LOW (ref 8.9–10.3)
Chloride: 105 mmol/L (ref 98–111)
Creatinine, Ser: 0.65 mg/dL (ref 0.44–1.00)
GFR, Estimated: >60 mL/min (ref >60)
Glucose, Bld: 106 mg/dL — ABNORMAL HIGH (ref 70–99)
Potassium: 3.6 mmol/L (ref 3.5–5.1)
Sodium: 134 mmol/L — ABNORMAL LOW (ref 135–145)

## 2022-04-06 LAB — MAGNESIUM: Magnesium: 1.9 mg/dL (ref 1.7–2.4)

## 2022-04-06 MED ORDER — POTASSIUM CHLORIDE 20 MEQ PO PACK: 20.0000 meq | PACK | Freq: Once | ORAL | Status: DC

## 2022-04-06 MED ORDER — MAGNESIUM SULFATE 2 GM/50ML IV SOLN
2.0000 g | Freq: Once | INTRAVENOUS | Status: AC
  Administered 2022-04-06: 2 g via INTRAVENOUS
  Filled 2022-04-06: qty 50

## 2022-04-06 MED ORDER — NICOTINE 14 MG/24HR TD PT24: 14.0000 mg | MEDICATED_PATCH | Freq: Every day | TRANSDERMAL | 0 refills | Status: DC

## 2022-04-06 MED ORDER — POTASSIUM CHLORIDE 20 MEQ PO PACK
40.0000 meq | PACK | Freq: Once | ORAL | Status: AC
  Administered 2022-04-06: 40 meq via ORAL
  Filled 2022-04-06: qty 2

## 2022-04-06 NOTE — Discharge Summary (Signed)
Physician Discharge Summary   Patient: Yvette [name] MRN: [id number] DOB: 07-Aug-1980  Admit date:     03/30/2022  Discharge date: 04/06/22  Discharge Physician: Marrion Coy   PCP: [facility]   Recommendations at discharge:    Avoid any medicine that can increase QT interval including Diflucan, Zofran,droperidol, Levaquin.  Ask your doctor to avoid any medicine that can prolong QT interval if you are prescribed any new medicine. Follow-up with PCP in 1 week.  Please refer patient to psychiatry. Follow-up with cardiology in 1 week.  Discharge Diagnoses: Principal Problem:   Ventricular fibrillation (HCC) Active Problems:   Alcohol-induced mood disorder (HCC)   Leukopenia   Hypokalemia   Thrombocytopenia (HCC)   Alcoholic cirrhosis of liver without ascites (HCC)   Alcohol abuse   Nausea vomiting and diarrhea   Hypomagnesemia   Hypophosphatemia   Acute hypoxemic respiratory failure (HCC)   Chronic systolic CHF (congestive heart failure) (HCC)   COVID-19 virus infection   Dilated cardiomyopathy (HCC)   Suicide ideation   Delirium tremens (HCC)   Hypovolemic shock (HCC)  Resolved Problems:   * No resolved hospital problems. [facility] Course: Patient is a 41 year old female with history of Kawasaki syndrome, alcohol abuse and alcoholic liver cirrhosis who present to the hospital with nausea vomiting.  She was found to have possible COVID.  She was giving a dose of droperidol.  She subsequently developed cardiac arrest with ventricular fibrillation, then ventricular tachycardia.  She was cardioverted.  She was intubated for acute respiratory failure. Her condition has improved, she was transferred to the medical floor on 9/20.  Patient was transferred back to ICU on 9/22 due to severe agitation requiring Precedex for alcohol withdrawal.  She also had a low blood pressure requiring pressor. Transferred back to medical floor on 9/24.  Assessment and  Plan: Hypovolemic shock. Delirium treatments. Patient developed severe agitation and confusion after initial recovery from cardiac arrest.  She had a severe delirium tremens.  She was placed on Precedex, CIWA protocol. She also had a significant drop in blood pressure, requiring pressor.  This is due to multifactorial factors including systolic congestive heart failure, beta-blocker as well as hypovolemia. All condition has improved.   Cardiac arrest with ventricular fibrillation. Long QT interval. Severe hypomagnesemia  Severe hypokalemia. Severe hypophosphatemia. Chronic systolic congestive heart failure. Alcoholic cardiomyopathy Myocardial injury secondary to cardiac arrest. Patient condition has improved.  Has been seen by cardiology, etiology of cardiac arrest appears to be due to systolic dysfunction, electrolytes abnormality in the setting of a long QT interval.  Droperidol may further prolong QT interval, triggered cardiac arrest.  She also had a positive COVID. Echocardiogram showed ejection fraction 35 to 40%.  Troponin 101, appears to be due to cardiac arrest instead of true coronary disease. Discussed with cardiology, patient will be followed with Dr. Kirke Corin in the office and to consider restart CHF treatment.  Currently, due to current COVID infection, alcohol use, borderline blood pressure, it is unsafe to start any other advanced treatment.   Acute hypoxemic respiratory failure secondary to cardiac arrest. Condition has improved.   Alcohol abuse. Alcoholic liver cirrhosis. Advised to quit.    Covid infection  a course of Molnupiravir completed.   Suicidal ideation. Patient had a suicide ideation at the onset of DVT, being seen by psychiatry, no additional risk.          Consultants: Cardiology, ICU, psychiatry. Procedures performed: Mechanical ventilation, Disposition: Home Diet recommendation:  Discharge  Diet Orders (From admission, onward)     Start      Ordered   04/06/22 0000  Diet - low sodium heart healthy        04/06/22 1054           Cardiac diet DISCHARGE MEDICATION: Allergies as of 04/06/2022   No Known Allergies      Medication List     STOP taking these medications    Diflucan 150 MG tablet Generic drug: fluconazole   naltrexone 50 MG tablet Commonly known as: DEPADE   ondansetron 8 MG disintegrating tablet Commonly known as: ZOFRAN-ODT   promethazine 25 MG suppository Commonly known as: PHENERGAN       TAKE these medications    Depo-Provera 150 MG/ML injection Generic drug: medroxyPROGESTERone Inject 150 mg into the muscle every 3 (three) months.   mirtazapine 15 MG tablet Commonly known as: REMERON Take 15 mg by mouth at bedtime.   nicotine 14 mg/24hr patch Commonly known as: NICODERM CQ - dosed in mg/24 hours Place 1 patch (14 mg total) onto the skin daily. Start taking on: April 07, 2022   pantoprazole 40 MG tablet Commonly known as: PROTONIX Take 40 mg by mouth daily.        Follow-up Information     [facility] Follow up in 1 week(s).   Contact information: [address] [place] [address] [phone]         Iran Ouch, MD Follow up in 1 week(s).   Specialty: Cardiology Contact information: [address] [place] [address] [phone]                Discharge Exam: [name] Weights   04/04/22 2214 04/05/22 0210 04/06/22 0500  Weight: 51.4 kg 51.4 kg 52.3 kg   General exam: Appears calm and comfortable  Respiratory system: Clear to auscultation. Respiratory effort normal. Cardiovascular system: S1 & S2 heard, RRR. No JVD, murmurs, rubs, gallops or clicks. No pedal edema. Gastrointestinal system: Abdomen is nondistended, soft and nontender. No organomegaly or masses felt. Normal bowel sounds heard. Central nervous system: Alert and oriented. No focal neurological deficits. Extremities: Symmetric 5 x 5  power. Skin: No rashes, lesions or ulcers Psychiatry: Judgement and insight appear normal. Mood & affect appropriate.    Condition at discharge: good  The results of significant diagnostics from this hospitalization (including imaging, microbiology, ancillary and laboratory) are listed below for reference.   Imaging Studies: US Abdomen Limited RUQ (LIVER/GB)  Result Date: 04/02/2022 CLINICAL DATA:  Abdominal pain EXAM: ULTRASOUND ABDOMEN LIMITED RIGHT UPPER QUADRANT COMPARISON:  01/08/2022 FINDINGS: Gallbladder: Minimal layering sludge within the gallbladder fundus. No gallstones or wall thickening visualized. No sonographic Murphy sign noted by sonographer. Common bile duct: Diameter: 5 mm. Liver: No focal lesion identified. Diffusely increased hepatic parenchymal echogenicity. Portal vein is patent on color Doppler imaging with normal direction of blood flow towards the liver. Other: None. IMPRESSION: 1. The echogenicity of the liver is increased. This is a nonspecific finding but is most commonly seen with fatty infiltration of the liver. There are no obvious focal liver lesions. 2. Trace gallbladder sludge.  No evidence of cholecystitis. Electronically Signed   By: Duanne Guess D.O.   On: 04/02/2022 13:58   ECHOCARDIOGRAM COMPLETE  Result Date: 03/31/2022    ECHOCARDIOGRAM REPORT   Patient Name:   LANITA STAMMEN Date of Exam: 03/31/2022 Medical Rec #:  [id number]      Height:  62.0 in Accession #:    [id number]     Weight:       112.9 lb Date of Birth:  1980-09-28     BSA:          1.499 m Patient Age:    40 years       BP:           101/84 mmHg Patient Gender: F              HR:           83 bpm. Exam Location:  [facility] Procedure: 2D Echo, Color Doppler and Cardiac Doppler Indications:     Acute respiratory distress R06.03  History:         Patient has prior history of Echocardiogram examinations, most                  recent 06/30/2021. ETOH abuse, tobacco abuse.  Sonographer:     Cristela Blue Referring Phys:  [id number] Erin Fulling Diagnosing Phys: Yvonne Kendall MD  Sonographer Comments: Technically challenging study due to limited acoustic windows, no apical window and no subcostal window. Pt is non-verbal and could not breath hold. IMPRESSIONS  1. Left ventricular ejection fraction, by estimation, is 35 to 40%. The left ventricle has moderately decreased function. The left ventricle demonstrates regional wall motion abnormalities (see scoring diagram/findings for description). Left ventricular  diastolic function could not be evaluated. There is severe hypokinesis of the left ventricular, entire anteroseptal wall and anterior wall.  2. Right ventricular systolic function is normal. The right ventricular size is normal.  3. The mitral valve is normal in structure. No evidence of mitral valve regurgitation.  4. The aortic valve is tricuspid. Aortic valve regurgitation is not visualized. Aortic valve gradient could not be assessed due to limited acoustic windows. FINDINGS  Left Ventricle: Left ventricular ejection fraction, by estimation, is 35 to 40%. The left ventricle has moderately decreased function. The left ventricle demonstrates regional wall motion abnormalities. Severe hypokinesis of the left ventricular, entire  anteroseptal wall and anterior wall. The left ventricular internal cavity size was normal in size. There is no left ventricular hypertrophy. Left ventricular diastolic function could not be evaluated. Right Ventricle: The right ventricular size is normal. No increase in right ventricular wall thickness. Right ventricular systolic function is normal. Left Atrium: Left atrial size was not well visualized. Right Atrium: Right atrial size was not well visualized. Pericardium: The pericardium was not well visualized. Mitral Valve: The mitral valve is normal in structure. No evidence of mitral valve regurgitation. Tricuspid Valve: The tricuspid valve is normal in structure. Tricuspid valve  regurgitation is not demonstrated. Aortic Valve: The aortic valve is tricuspid. Aortic valve regurgitation is not visualized. Aortic valve gradient could not be assessed due to limited acoustic windows. Pulmonic Valve: The pulmonic valve was normal in structure. Pulmonic valve regurgitation is mild. No evidence of pulmonic stenosis. Aorta: The aortic root is normal in size and structure. Pulmonary Artery: The pulmonary artery is of normal size. IAS/Shunts: The interatrial septum was not well visualized.  LEFT VENTRICLE PLAX 2D LVIDd:         4.10 cm LVIDs:         3.00 cm LV PW:         0.70 cm LV IVS:        0.50 cm LVOT diam:     2.00 cm LVOT Area:     3.14 cm  LEFT ATRIUM  Index LA diam:    2.60 cm 1.73 cm/m                        PULMONIC VALVE AORTA                 PR End Diast Vel: 9.73 msec Ao Root diam: 2.57 cm   SHUNTS Systemic Diam: 2.00 cm Yvonne Kendall MD Electronically signed by Yvonne Kendall MD Signature Date/Time: 03/31/2022/3:31:18 PM    Final    CT Angio Chest Pulmonary Embolism (PE) W or WO Contrast  Result Date: 03/30/2022 CLINICAL DATA:  Pulmonary embolism suspected. EXAM: CT ANGIOGRAPHY CHEST AND ABDOMEN TECHNIQUE: Multidetector CT imaging of the chest and abdomen was performed using the standard protocol during bolus administration of intravenous contrast. Multiplanar CT image reconstructions and MIPs were obtained to evaluate the vascular anatomy. RADIATION DOSE REDUCTION: This exam was performed according to the departmental dose-optimization program which includes automated exposure control, adjustment of the mA and/or kV according to patient size and/or use of iterative reconstruction technique. CONTRAST:  1mL OMNIPAQUE IOHEXOL 350 MG/ML SOLN COMPARISON:  None Available. FINDINGS: CTA CHEST FINDINGS Cardiovascular: Satisfactory opacification of the pulmonary arteries to the segmental level. No evidence of pulmonary embolism. Normal heart size. No pericardial effusion.  Mediastinum/Nodes: No enlarged mediastinal, hilar, or axillary lymph nodes. Thyroid is unremarkable. Endotracheal tube with distal tip approximately 1.3 cm above the carina. Lungs/Pleura: Bibasilar dependent atelectasis. No focal consolidation or pleural effusion. Musculoskeletal: Mild irregularity of the sternal body and manubrium as well as cortical irregularity of bilateral ribs, it may be artifact due to significant respiratory motion or mildly displaced fractures in this patient with CPR. Review of the MIP images confirms the above findings. CTA ABDOMEN FINDINGS Aorta: Normal caliber aorta without aneurysm, dissection, vasculitis or significant stenosis. Celiac: Patent without evidence of aneurysm, dissection, vasculitis or significant stenosis. SMA: Patent without evidence of aneurysm, dissection, vasculitis or significant stenosis. Renals: Both renal arteries are patent without evidence of aneurysm, dissection, vasculitis, fibromuscular dysplasia or significant stenosis. IMA: Patent without evidence of aneurysm, dissection, vasculitis or significant stenosis. Inflow: Patent without evidence of aneurysm, dissection, vasculitis or significant stenosis. Veins: No obvious venous abnormality within the limitations of this arterial phase study. Right femoral access catheter with distal tip in the internal iliac vein. Hepatobiliary: No acute hepatic abnormality. No biliary ductal dilatation. Gallbladder is distended without pericholecystic inflammatory changes. Pancreas: Pancreas is unremarkable. No pancreatic ductal dilatation. Spleen: Normal in size without evidence of acute trauma. Adrenals/Urinary Tract: Adrenals are unremarkable. Symmetric perfusion of bilateral kidneys. No evidence of nephrolithiasis or hydronephrosis. No ureteral calculus. Urinary bladder is distended with Foley's catheter in place and small amount of air, likely iatrogenic. Stomach/Bowel: Stomach is unremarkable. Bowel loops are normal in  caliber. No evidence of colitis or diverticulitis. Lymphatic: No lymphadenopathy. Musculoskeletal: No acute osseous abnormality. Other: None Review of the MIP images confirms the above findings. IMPRESSION: CT angio chest: 1. No evidence of pulmonary embolism. 2. No acute vascular abnormality. 3. Bibasilar dependent atelectasis, without evidence of pleural effusion or pneumothorax. 4. Irregularity of the sternum as well as multiple bilateral ribs, evaluation is limited due to significant respiratory motion, it may be artifact due to motion or mildly displaced fractures in this patient with cardiopulmonary resuscitation. CTA abdomen/pelvis: 1.  No CT evidence of acute abdominal/pelvic process. 2. Bowel loops are normal in caliber. No evidence of obstruction, colitis or diverticulitis. 3. Gallbladder is distended with bile, without evidence of  surrounding inflammatory changes. 4. Right femoral vein access central line with distal tip in the right internal iliac vein, repositioning could be considered if there is concern for the function of the central line. Electronically Signed   By: Keane Police D.O.   On: 03/30/2022 21:35   CT ABDOMEN PELVIS W CONTRAST  Result Date: 03/30/2022 CLINICAL DATA:  Pulmonary embolism suspected. EXAM: CT ANGIOGRAPHY CHEST AND ABDOMEN TECHNIQUE: Multidetector CT imaging of the chest and abdomen was performed using the standard protocol during bolus administration of intravenous contrast. Multiplanar CT image reconstructions and MIPs were obtained to evaluate the vascular anatomy. RADIATION DOSE REDUCTION: This exam was performed according to the departmental dose-optimization program which includes automated exposure control, adjustment of the mA and/or kV according to patient size and/or use of iterative reconstruction technique. CONTRAST:  34mL OMNIPAQUE IOHEXOL 350 MG/ML SOLN COMPARISON:  None Available. FINDINGS: CTA CHEST FINDINGS Cardiovascular: Satisfactory opacification of the  pulmonary arteries to the segmental level. No evidence of pulmonary embolism. Normal heart size. No pericardial effusion. Mediastinum/Nodes: No enlarged mediastinal, hilar, or axillary lymph nodes. Thyroid is unremarkable. Endotracheal tube with distal tip approximately 1.3 cm above the carina. Lungs/Pleura: Bibasilar dependent atelectasis. No focal consolidation or pleural effusion. Musculoskeletal: Mild irregularity of the sternal body and manubrium as well as cortical irregularity of bilateral ribs, it may be artifact due to significant respiratory motion or mildly displaced fractures in this patient with CPR. Review of the MIP images confirms the above findings. CTA ABDOMEN FINDINGS Aorta: Normal caliber aorta without aneurysm, dissection, vasculitis or significant stenosis. Celiac: Patent without evidence of aneurysm, dissection, vasculitis or significant stenosis. SMA: Patent without evidence of aneurysm, dissection, vasculitis or significant stenosis. Renals: Both renal arteries are patent without evidence of aneurysm, dissection, vasculitis, fibromuscular dysplasia or significant stenosis. IMA: Patent without evidence of aneurysm, dissection, vasculitis or significant stenosis. Inflow: Patent without evidence of aneurysm, dissection, vasculitis or significant stenosis. Veins: No obvious venous abnormality within the limitations of this arterial phase study. Right femoral access catheter with distal tip in the internal iliac vein. Hepatobiliary: No acute hepatic abnormality. No biliary ductal dilatation. Gallbladder is distended without pericholecystic inflammatory changes. Pancreas: Pancreas is unremarkable. No pancreatic ductal dilatation. Spleen: Normal in size without evidence of acute trauma. Adrenals/Urinary Tract: Adrenals are unremarkable. Symmetric perfusion of bilateral kidneys. No evidence of nephrolithiasis or hydronephrosis. No ureteral calculus. Urinary bladder is distended with Foley's catheter  in place and small amount of air, likely iatrogenic. Stomach/Bowel: Stomach is unremarkable. Bowel loops are normal in caliber. No evidence of colitis or diverticulitis. Lymphatic: No lymphadenopathy. Musculoskeletal: No acute osseous abnormality. Other: None Review of the MIP images confirms the above findings. IMPRESSION: CT angio chest: 1. No evidence of pulmonary embolism. 2. No acute vascular abnormality. 3. Bibasilar dependent atelectasis, without evidence of pleural effusion or pneumothorax. 4. Irregularity of the sternum as well as multiple bilateral ribs, evaluation is limited due to significant respiratory motion, it may be artifact due to motion or mildly displaced fractures in this patient with cardiopulmonary resuscitation. CTA abdomen/pelvis: 1.  No CT evidence of acute abdominal/pelvic process. 2. Bowel loops are normal in caliber. No evidence of obstruction, colitis or diverticulitis. 3. Gallbladder is distended with bile, without evidence of surrounding inflammatory changes. 4. Right femoral vein access central line with distal tip in the right internal iliac vein, repositioning could be considered if there is concern for the function of the central line. Electronically Signed   By: Judye Bos.O.  On: 03/30/2022 21:35   CT HEAD WO CONTRAST ( )  Result Date: 03/30/2022 CLINICAL DATA:  Neuro deficit, acute stroke suspected.  Post CPR. EXAM: CT HEAD WITHOUT CONTRAST TECHNIQUE: Contiguous axial images were obtained from the base of the skull through the vertex without intravenous contrast. RADIATION DOSE REDUCTION: This exam was performed according to the departmental dose-optimization program which includes automated exposure control, adjustment of the mA and/or kV according to patient size and/or use of iterative reconstruction technique. COMPARISON:  None Available. FINDINGS: Brain: No evidence of acute infarction, hemorrhage, hydrocephalus, extra-axial collection or mass lesion/mass  effect. Mild cerebral volume loss advanced for age. Vascular: No hyperdense vessel or unexpected calcification. Skull: Normal. Negative for fracture or focal lesion. Sinuses/Orbits: Patchy opacification of the ethmoid air cells. Remaining paranasal sinuses are clear. Other: None. IMPRESSION: 1.  No acute intracranial abnormality. 2.  Mild cerebral volume loss, advanced for age. 3.  Mild paranasal sinus disease. Electronically Signed   By: Larose Hires D.O.   On: 03/30/2022 21:09   DG Chest Portable 1 View  Result Date: 03/30/2022 CLINICAL DATA:  Cardiac arrest EXAM: PORTABLE CHEST 1 VIEW COMPARISON:  Previous studies including the examination of 02/23/2021 FINDINGS: Transverse diameter of heart is slightly increased. Tip of endotracheal tube is 4 cm above the carina. Distal portion of enteric tube is seen in the stomach. There are patchy densities in left upper lung field and left lower lung field. No focal abnormalities are seen in right lung. Costophrenic angles are clear. There is no pneumothorax. IMPRESSION: There are patchy densities in left upper and left lower lung fields suggesting atelectasis/pneumonia. Electronically Signed   By: Ernie Avena M.D.   On: 03/30/2022 19:16    Microbiology: Results for orders placed or performed during the hospital encounter of 03/30/22  MRSA Next Gen by PCR, Nasal     Status: None   Collection Time: 03/30/22  9:27 PM   Specimen: Nasal Mucosa; Nasal Swab  Result Value Ref Range Status   MRSA by PCR Next Gen NOT DETECTED NOT DETECTED Final    Comment: (NOTE) The GeneXpert MRSA Assay (FDA approved for NASAL specimens only), is one component of a comprehensive MRSA colonization surveillance program. It is not intended to diagnose MRSA infection nor to guide or monitor treatment for MRSA infections. Test performance is not FDA approved in patients less than 69 years old. Performed at [facility], [address] [place], [place] [address]    SARS Coronavirus 2 by RT PCR (hospital order, performed in [facility] hospital lab) *cepheid single result test* Anterior Nasal Swab     Status: Abnormal   Collection Time: 03/30/22  9:45 PM   Specimen: Anterior Nasal Swab  Result Value Ref Range Status   SARS Coronavirus 2 by RT PCR POSITIVE (A) NEGATIVE Final    Comment: (NOTE) SARS-CoV-2 target nucleic acids are DETECTED  SARS-CoV-2 RNA is generally detectable in upper respiratory specimens  during the acute phase of infection.  Positive results are indicative  of the presence of the identified virus, but do not rule out bacterial infection or co-infection with other pathogens not detected by the test.  Clinical correlation with patient history and  other diagnostic information is necessary to determine patient infection status.  The expected result is negative.  Fact Sheet for Patients:   RoadLapTop.co.za   Fact Sheet for Healthcare Providers:   http://kim-miller.com/    This test is not yet approved or cleared by the [place] and  has been authorized for detection and/or diagnosis of SARS-CoV-2 by FDA under an Emergency Use Authorization (EUA).  This EUA will remain in effect (meaning this test can be used) for the duration of  the COVID-19 declaration under Section 564(b)(1)  of the Act, 21 U.S.C. section 360-bbb-3(b)(1), unless the authorization is terminated or revoked sooner.   Performed at [facility], [address] [place], [place] [address]   Culture, blood (Routine X 2) w Reflex to ID Panel     Status: None (Preliminary result)   Collection Time: 04/02/22 10:34 AM   Specimen: BLOOD  Result Value Ref Range Status   Specimen Description BLOOD BLOOD LEFT HAND  Final   Special Requests   Final    BOTTLES DRAWN AEROBIC ONLY Blood Culture results may not be optimal due to an inadequate volume of blood received in culture bottles   Culture   Final     NO GROWTH 4 DAYS Performed at [facility], [address] [place], [place] [address]    Report Status PENDING  Incomplete  Culture, blood (Routine X 2) w Reflex to ID Panel     Status: None (Preliminary result)   Collection Time: 04/02/22 12:10 PM   Specimen: BLOOD RIGHT HAND  Result Value Ref Range Status   Specimen Description BLOOD RIGHT HAND  Final   Special Requests   Final    BOTTLES DRAWN AEROBIC ONLY Blood Culture results may not be optimal due to an inadequate volume of blood received in culture bottles   Culture   Final    NO GROWTH 4 DAYS Performed at [facility], [address] [place], [place] [address]    Report Status PENDING  Incomplete    Labs: CBC: Recent Labs  Lab 03/30/22 2154 04/02/22 1056 04/03/22 0523 04/04/22 1216 04/05/22 0655 04/06/22 0500  WBC 6.1 3.4* 3.0* 3.8* 2.8* 3.0*  NEUTROABS 5.4 2.3  --   --   --   --   HGB 9.8* 10.9* 9.7* 9.2* 9.6* 8.9*  HCT 30.1* 34.1* 30.2* 27.5* 29.4* 27.0*  MCV 82.0 82.4 82.3 80.4 80.8 80.6  PLT 105* 116* 116* 146* 173 209   Basic Metabolic Panel: Recent Labs  Lab 04/01/22 0804 04/02/22 1012 04/02/22 1056 04/03/22 0523 04/04/22 0630 04/05/22 0655 04/06/22 0500  NA 140 141 141 137 138 137 134*  K 3.5 4.5 5.0 4.0 4.1 4.3 3.6  CL 102 109 108 106 110 106 105  CO2 21* 17* 24 23  GLUCOSE 84 100* 124* 104* 101* 105* 106*  BUN 5* 8 8 5* <5* 7 6  CREATININE 0.68 0.50 0.66 0.58 0.64 0.71 0.65  CALCIUM 8.3* 8.8* 8.8* 8.3* 8.6* 9.0 8.5*  MG 1.9 2.0  --  1.4* 2.0 1.5* 1.9  PHOS 2.2* 4.2  --  4.9* 4.2 4.4  --    Liver Function Tests: Recent Labs  Lab 03/30/22 1242 03/30/22 2154 04/02/22 1056  AST 229* 424* 64*  ALT 80* 96* 53*  ALKPHOS 109 80 86  BILITOT 0.8 0.8 0.5  PROT 9.1* 6.7 6.9  ALBUMIN 4.9 3.4* 3.2*   CBG: Recent Labs  Lab 04/03/22 2005 04/03/22 2348 04/04/22 0430 04/04/22 0717 04/04/22 1140  GLUCAP 73 125* 123* 86 84    Discharge time spent: greater than  30 minutes.  Signed: Marrion Coy, MD [facility] 04/06/2022

## 2022-04-06 NOTE — Progress Notes (Signed)
Vital signs rechecked 1200, confirm  04/06/22 1147  Assess: MEWS Score  Temp 97.7 F (36.5 C)  BP (!) 96/55  MAP (mmHg) 68  Pulse Rate (!) 114  Resp 16  SpO2 99 %  Assess: MEWS Score  MEWS Temp 0  MEWS Systolic 1  MEWS Pulse 2  MEWS RR 0  MEWS LOC 0  MEWS Score 3  MEWS Score Color Yellow  Assess: if the MEWS score is Yellow or Red  Were vital signs taken at a resting state? Yes  Focused Assessment No change from prior assessment  Does the patient meet 2 or more of the SIRS criteria? No  MEWS guidelines implemented *See Row Information* Yes  Notify: Charge Nurse/RN  Name of Charge Nurse/RN Notified Montine Circle  Date Charge Nurse/RN Notified 04/06/22  Time Charge Nurse/RN Notified [id number]  Document  Patient Outcome Other (Comment) (continue to monitor, scheduled valium administered)  Progress note created (see row info) Yes  Assess: SIRS CRITERIA  SIRS Temperature  0  SIRS Pulse 1  SIRS Respirations  0  SIRS WBC 1  SIRS Score Sum  2    yellows. No change in assessment continuing to monitor

## 2022-04-06 NOTE — TOC Initial Note (Signed)
Transition of Care [facility]) - Initial/Assessment Note    Patient Details  Name: Yvette [name] MRN: [id number] Date of Birth: 1980-08-08  [facility] of [facility]) CM/SW Contact:    Laurena Slimmer, RN Phone Number: 04/06/2022, 1:51 PM  Clinical Narrative:                 Attempt to reach patient on multiple attempt to complete risk assessement and discuss discharge plan. Phone line remains busy.         Patient Goals and CMS Choice        Expected Discharge Plan and Services           Expected Discharge Date: 04/06/22                                    Prior Living Arrangements/Services                       Activities of Daily Living Home Assistive Devices/Equipment: None ADL Screening (condition at time of admission) Patient's cognitive ability adequate to safely complete daily activities?: Yes Is the patient deaf or have difficulty hearing?: No Does the patient have difficulty seeing, even when wearing glasses/contacts?: No Does the patient have difficulty concentrating, remembering, or making decisions?: No Patient able to express need for assistance with ADLs?: No Does the patient have difficulty dressing or bathing?: No Independently performs ADLs?: Yes (appropriate for developmental age) Does the patient have difficulty walking or climbing stairs?: No Weakness of Legs: None Weakness of Arms/Hands: None  Permission Sought/Granted                  Emotional Assessment              Admission diagnosis:  Cardiac arrest [facility]) [I46.9] Patient Active Problem List   Diagnosis Date Noted   Suicide ideation 04/05/2022   Delirium tremens ([place]) 04/05/2022   Hypovolemic shock ([place]) 04/05/2022   Alcohol-induced mood disorder ([place]) 04/03/2022   Dilated cardiomyopathy ([place])    Hypophosphatemia 04/01/2022   Acute hypoxemic respiratory failure ([place]) 09/38/1829   Chronic systolic CHF (congestive heart failure) ([place]) 04/01/2022   COVID-19  virus infection 04/01/2022   Ventricular fibrillation ([place]) 03/30/2022   Hypomagnesemia 01/09/2022   Nausea vomiting and diarrhea 01/08/2022   Intractable nausea and vomiting 06/28/2021   Tobacco abuse 06/28/2021   Alcohol abuse 06/28/2021   Hematemesis 93/71/6967   Alcoholic liver disease, unspecified ([place]) 10/26/2020   Alcohol withdrawal ([place]) 89/38/1017   Alcoholic ketoacidosis 51/08/5850   AKI (acute kidney injury) ([place]) 10/26/2020   High serum osmolar gap 77/82/4235   Alcoholic cirrhosis of liver without ascites ([place])    Hiatal hernia    Portal hypertension ([facility])    Nausea & vomiting 36/14/4315   Alcoholic gastritis 40/02/6760   Intractable vomiting 10/01/2019   Alcohol use, daily 09/30/2019   Elevated LFTs 09/30/2019   Thrombocytopenia ([place]) 09/30/2019   Hypokalemia    Dehydration    Food poisoning    Other neutropenia ([place]) 06/11/2016   Leukopenia 02/03/2015   PCP:  [facility]:   [facility] [address]), [place] [address] [place] ([place] [address] Phone: [phone] Fax: [phone]     Social Determinants of Health (SDOH) Interventions Food Insecurity Interventions: Intervention Not Indicated Housing Interventions: Intervention Not Indicated Transportation Interventions: Intervention Not Indicated Utilities  Interventions: Intervention Not Indicated  Readmission Risk Interventions     No data to display

## 2022-04-06 NOTE — Progress Notes (Signed)
Rounding Note    Patient Name: Yvette [name] Date of Encounter: 04/06/2022  [facility] Cardiologist: Yvette Bears, MD   Subjective   No chest pain, shortness of breath, palpitations, or nausea  Inpatient Medications    Scheduled Meds:  vitamin C  500 mg Oral Daily   Chlorhexidine Gluconate Cloth  6 each Topical Q0600   diazepam  2.5 mg Intravenous Q6H   enoxaparin (LOVENOX) injection  40 mg Subcutaneous Q24H   feeding supplement  237 mL Oral BID BM   folic acid  1 mg Oral Daily   multivitamin with minerals  1 tablet Oral Daily   nicotine  14 mg Transdermal Daily   sodium chloride flush  3 mL Intravenous Q12H   [START ON 04/07/2022] thiamine (VITAMIN B1) injection  100 mg Intravenous Daily   zinc sulfate  220 mg Oral Daily   Continuous Infusions:  sodium chloride     sodium chloride Stopped (04/05/22 1007)   thiamine (VITAMIN B1) injection Stopped (04/05/22 1801)   PRN Meds: sodium chloride, docusate sodium, LORazepam, mouth rinse, mouth rinse, polyethylene glycol, sodium chloride flush   Vital Signs    Vitals:   04/06/22 0500 04/06/22 0910 04/06/22 1147 04/06/22 1200  BP: 100/65 114/71 (!) 96/55 111/72  Pulse: 99 (!) 108 (!) 114 (!) 119  Resp: 20 17 16    Temp: 98.9 F (37.2 C) 98.9 F (37.2 C) 97.7 F (36.5 C) 98.2 F (36.8 C)  TempSrc: Oral   Oral  SpO2: 100% 98% 99% 99%  Weight: 52.3 kg     Height:        Intake/Output Summary (Last 24 hours) at 04/06/2022 1536 Last data filed at 04/06/2022 1127 Gross per 24 hour  Intake 296.76 ml  Output --  Net 296.76 ml      04/06/2022    5:00 AM 04/05/2022    2:10 AM 04/04/2022   10:14 PM  Last 3 Weights  Weight (lbs) 115 lb 4.8 oz 113 lb 5.1 oz 113 lb 5.1 oz  Weight (kg) 52.3 kg 51.4 kg 51.4 kg      Telemetry    Normal sinus rhythm and sinus tachycardia - Personally Reviewed  ECG    No new tracing - Personally Reviewed  Physical Exam   GEN: No acute distress.   Neck: No JVD Cardiac:  RRR, no murmurs, rubs, or gallops.  Respiratory: Clear to auscultation bilaterally. GI: Soft, nontender, non-distended  MS: No edema; No deformity. Neuro:  Nonfocal  Psych: Flat affect   Labs    High Sensitivity Troponin:   Recent Labs  Lab 03/30/22 1242 03/30/22 2021 03/30/22 2154 03/31/22 0209 03/31/22 0608  TROPONINIHS 5 24* 62* 101* 90*     Chemistry Recent Labs  Lab 03/30/22 2154 03/31/22 0209 04/02/22 1056 04/03/22 0523 04/04/22 0630 04/05/22 0655 04/06/22 0500  NA 140   < > 141   < > 138 137 134*  K 3.4*   < > 5.0   < > 4.1 4.3 3.6  CL 106   < > 108   < > 110 106 105  CO2 19*   < > 26   < > 17* 24 23  GLUCOSE 170*   < > 124*   < > 101* 105* 106*  BUN 7   < > 8   < > <5* 7 6  CREATININE 0.87   < > 0.66   < > 0.64 0.71 0.65  CALCIUM 7.3*   < >  8.8*   < > 8.6* 9.0 8.5*  MG 1.2*   < >  --    < > 2.0 1.5* 1.9  PROT 6.7  --  6.9  --   --   --   --   ALBUMIN 3.4*  --  3.2*  --   --   --   --   AST 424*  --  64*  --   --   --   --   ALT 96*  --  53*  --   --   --   --   ALKPHOS 80  --  86  --   --   --   --   BILITOT 0.8  --  0.5  --   --   --   --   GFRNONAA >60   < > >60   < > >60 >60 >60  ANIONGAP 15   < > 7   < > 11 7 6    < > = values in this interval not displayed.    Lipids  Recent Labs  Lab 03/31/22 0209  TRIG 89    Hematology Recent Labs  Lab 04/04/22 1216 04/05/22 0655 04/06/22 0500  WBC 3.8* 2.8* 3.0*  RBC 3.42* 3.64* 3.35*  HGB 9.2* 9.6* 8.9*  HCT 27.5* 29.4* 27.0*  MCV 80.4 80.8 80.6  MCH 26.9 26.4 26.6  MCHC 33.5 32.7 33.0  RDW 16.3* 16.7* 16.5*  PLT 146* 173 209   Thyroid No results for input(s): "TSH", "FREET4" in the last 168 hours.  BNPNo results for input(s): "BNP", "PROBNP" in the last 168 hours.  DDimer  Recent Labs  Lab 04/02/22 0509 04/03/22 0523 04/04/22 0630  DDIMER 2.31* >20.00* 9.77*     Radiology    No results found.  Cardiac Studies   TTE (03/31/2022):  1. Left ventricular ejection fraction, by  estimation, is 35 to 40%. The  left ventricle has moderately decreased function. The left ventricle  demonstrates regional wall motion abnormalities (see scoring  diagram/findings for description). Left ventricular   diastolic function could not be evaluated. There is severe hypokinesis of  the left ventricular, entire anteroseptal wall and anterior wall.   2. Right ventricular systolic function is normal. The right ventricular  size is normal.   3. The mitral valve is normal in structure. No evidence of mitral valve  regurgitation.   4. The aortic valve is tricuspid. Aortic valve regurgitation is not  visualized. Aortic valve gradient could not be assessed due to limited  acoustic windows.   Patient Profile     41 y.o. 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  Assessment & Plan    Ventricular fibrillation and QT prolongation: No further ventricular ectopy observed.  Telemetry suggest persistent QT prolongation.  EKG requested and ordered by Dr. 20 but nursing did not complete this prior to the patient being discharged. -I counseled Yvette [name] at length regarding importance of alcohol abstinence and avoiding QT prolonging medications including antiemetics such as droperidol, ondansetron, and promethazine. -Replete electrolytes to maintain potassium and magnesium levels greater than 4.0 and 2.0, respectively.  Acute HFrEF: LVEF moderately reduced this admission following cardiac arrest.  Suspect this may be stress-induced/stunning.  Prior exercise tolerance test earlier this year was low risk without evidence of ischemia. -No plans for further ischemia evaluation this admission. -  Unable to add any beta-blocker or ACE inhibitor/ARB at this time given persistent borderline hypotension.  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  Yvette [name] states that she is planning to quit together with her husband. -Encourage alcohol cessation.  For questions or updates, please contact [place] Please consult [email or web address] for contact info under [facility].     Signed, Nelva Bush, MD  04/06/2022, 3:36 PM

## 2022-04-06 NOTE — Progress Notes (Signed)
PHARMACY CONSULT NOTE - FOLLOW UP  Pharmacy Consult for Electrolyte Monitoring and Replacement   Recent Labs: Potassium (mmol/L)  Date Value  04/06/2022 3.6   Magnesium (mg/dL)  Date Value  04/06/2022 1.9   Calcium (mg/dL)  Date Value  04/06/2022 8.5 (L)   Albumin (g/dL)  Date Value  04/02/2022 3.2 (L)  02/05/2021 4.9 (H)   Phosphorus (mg/dL)  Date Value  04/05/2022 4.4   Sodium (mmol/L)  Date Value  04/06/2022 134 (L)  02/05/2021 141     Assessment: 41 y/o female with h/o Kawasaki's disease, etoh abuse and cirrhosis who is admitted with COVID 19 complicated by cardiac arrest with ventricular fibrillation and alcohol withdrawal. Pharmacy is asked to follow and replace electrolytes.   9/21 Mag 1.9 given Magnesium 2 grams x 1 9/22 Mag 2.0 9/23 Mag 1.4, given Magnesium 4 grams x 1 9/24 Mag 2.0  On 9/21 received total Kcl of 80 mEq for K+ 3.5. Afterwards, K+ 5.0 (+1.5).  Goal of Therapy:  Potassium 4.0 - 5.1 mmol/L Magnesium 2.0 - 2.4 mg/dL All Other Electrolytes WNL  Plan: Potassium and Magnesium below goal. Aggressively repleting, although considering past response to replacement. --Give Magnesium 2 grams x 1 --Give Kcl 40 mEq x 1  --Recheck electrolytes in am --Managing electrolytes with aggressive repletion given presentation with CV arrest and QT prolongation this admission.   Glean Salvo, PharmD Clinical Pharmacist  04/06/2022 8:07 AM

## 2022-04-06 NOTE — Progress Notes (Signed)
   04/06/22 1200  Assess: MEWS Score  Temp 98.2 F (36.8 C)  BP 111/72  MAP (mmHg) 84  Pulse Rate (!) 119  SpO2 99 %  Assess: MEWS Score  MEWS Temp 0  MEWS Systolic 0  MEWS Pulse 2  MEWS RR 0  MEWS LOC 0  MEWS Score 2  MEWS Score Color Yellow  Assess: if the MEWS score is Yellow or Red  Were vital signs taken at a resting state? Yes  Focused Assessment No change from prior assessment  Does the patient meet 2 or more of the SIRS criteria? No  MEWS guidelines implemented *See Row Information* Yes  Notify: Charge Nurse/RN  Name of Charge Nurse/RN Notified Montine Circle  Date Charge Nurse/RN Notified 04/06/22  Time Charge Nurse/RN Notified [id number]  Document  Patient Outcome Other (Comment) (continuing to monitor)  Progress note created (see row info) Yes  Assess: SIRS CRITERIA  SIRS Temperature  0  SIRS Pulse 1  SIRS Respirations  0  SIRS WBC 1  SIRS Score Sum  2

## 2022-04-07 LAB — CULTURE, BLOOD (ROUTINE X 2)
Culture: NO GROWTH
Culture: NO GROWTH

## 2022-04-15 ENCOUNTER — Other Ambulatory Visit
Admission: RE | Admit: 2022-04-15 | Discharge: 2022-04-15 | Disposition: A | Discharge status: Home / Self Care | Payer: Medicaid Other | Source: Ambulatory Visit | Attending: Nurse Practitioner | Admitting: Nurse Practitioner

## 2022-04-15 ENCOUNTER — Encounter: Payer: Self-pay | Admitting: Nurse Practitioner

## 2022-04-15 ENCOUNTER — Telehealth: Payer: Self-pay | Admitting: Cardiovascular Disease

## 2022-04-15 ENCOUNTER — Ambulatory Visit: Payer: Medicaid Other | Attending: Cardiovascular Disease | Admitting: Nurse Practitioner

## 2022-04-15 VITALS — BP 112/70 | HR 102 | Ht 62.0 in | Wt 116.6 lb

## 2022-04-15 DIAGNOSIS — F1010 Alcohol abuse, uncomplicated: Secondary | ICD-10-CM

## 2022-04-15 DIAGNOSIS — Z720 Tobacco use: Secondary | ICD-10-CM

## 2022-04-15 DIAGNOSIS — I4729 Other ventricular tachycardia: Secondary | ICD-10-CM | POA: Insufficient documentation

## 2022-04-15 DIAGNOSIS — I469 Cardiac arrest, cause unspecified: Secondary | ICD-10-CM | POA: Diagnosis not present

## 2022-04-15 DIAGNOSIS — R9431 Abnormal electrocardiogram [ECG] [EKG]: Secondary | ICD-10-CM | POA: Diagnosis present

## 2022-04-15 DIAGNOSIS — I5020 Unspecified systolic (congestive) heart failure: Secondary | ICD-10-CM

## 2022-04-15 DIAGNOSIS — I428 Other cardiomyopathies: Secondary | ICD-10-CM | POA: Diagnosis not present

## 2022-04-15 DIAGNOSIS — K2920 Alcoholic gastritis without bleeding: Secondary | ICD-10-CM

## 2022-04-15 LAB — BASIC METABOLIC PANEL
Anion gap: 11 (ref 5–15)
BUN: 7 mg/dL (ref 6–20)
CO2: 23 mmol/L (ref 22–32)
Calcium: 9.4 mg/dL (ref 8.9–10.3)
Chloride: 103 mmol/L (ref 98–111)
Creatinine, Ser: 0.53 mg/dL (ref 0.44–1.00)
GFR, Estimated: >60 mL/min (ref >60)
Glucose, Bld: 91 mg/dL (ref 70–99)
Potassium: 4.8 mmol/L (ref 3.5–5.1)
Sodium: 137 mmol/L (ref 135–145)

## 2022-04-15 LAB — MAGNESIUM: Magnesium: 1.8 mg/dL (ref 1.7–2.4)

## 2022-04-15 NOTE — Patient Instructions (Addendum)
Medication Instructions:  1.Stop remeron *If you need a refill on your cardiac medications before your next appointment, please call your pharmacy*   Lab Work: BMET and Mag today If you have labs (blood work) drawn today and your tests are completely normal, you will receive your results only by: [facility] (if you have MyChart) OR A paper copy in the mail If you have any lab test that is abnormal or we need to change your treatment, we will call you to review the results.   Follow-Up: At [facility], you and your health needs are our priority.  As part of our continuing mission to provide you with exceptional heart care, we have created designated Provider Care Teams.  These Care Teams include your primary Cardiologist (physician) and Advanced Practice Providers (APPs -  Physician Assistants and Nurse Practitioners) who all work together to provide you with the care you need, when you need it.  We recommend signing up for the patient portal called "MyChart".  Sign up information is provided on this After Visit Summary.  MyChart is used to connect with patients for Virtual Visits (Telemedicine).  Patients are able to view lab/test results, encounter notes, upcoming appointments, etc.  Non-urgent messages can be sent to your provider as well.   To learn more about what you can do with MyChart, go to [email or web address].    Your next appointment:   6 week(s)  The format for your next appointment:   In Person  Provider:   You may see Kathlyn Sacramento, MD or one of the following Advanced Practice Providers on your designated Care Team:   Murray Hodgkins, NP Christell Faith, PA-C Cadence Kathlen Mody, PA-C Gerrie Nordmann, NP     Important Information About Sugar

## 2022-04-15 NOTE — Telephone Encounter (Signed)
Reviewed medication instructions from today and she verbalized understanding with no further questions at this time.

## 2022-04-15 NOTE — Telephone Encounter (Signed)
No answer/Voicemail box is full.  

## 2022-04-15 NOTE — Telephone Encounter (Signed)
Patient is requesting to go over medication instructions.

## 2022-04-15 NOTE — Progress Notes (Signed)
Office Visit    Patient Name: Yvette [name] Date of Encounter: 04/15/2022  Primary Care Provider:  [place] Primary Cardiologist:  Kathlyn Sacramento, MD  Chief Complaint    41 year old female with a history of tobacco and alcohol abuse, alcoholic gastritis, persistent nausea and vomiting, electrolyte abnormalities, prolonged QT, ventricular tachycardia, GERD, anemia, transaminitis, neutropenia, and Kawasaki disease. presents for hospital follow-up after recent hospitalization for VF arrest.  Past Medical History    Past Medical History:  Diagnosis Date   Asthma    Cardiac arrest ([place])    ETOH abuse    H/O Kawasaki's disease    as 85 month old child and at 88 years   History of anemia    History of blood transfusion 1984   Hypokalemia    Hypomagnesemia    a. in the setting of n/v->gastritis/etoh.   Leukopenia    Neutropenia (HCC)    NICM (nonischemic cardiomyopathy) ([place])    a. 06/2021 Echo: EF 50-55%, [name]; b. 09/2021 ETT: Ex time 6:19, [name], [name]; c. 03/2022 Echo: EF 35-40%, [name].   NSVT (nonsustained ventricular tachycardia) ([place])    a. Noted during admission 06/2021 assoc w/ palpitations and presyncope in the setting of profound QT prolongation/gastritis.   Tobacco abuse    Transaminitis    Past Surgical History:  Procedure Laterality Date   ESOPHAGOGASTRODUODENOSCOPY N/A 10/29/2020   Procedure: ESOPHAGOGASTRODUODENOSCOPY (EGD);  Surgeon: Jonathon Bellows, MD;  Location[facility] ENDOSCOPY;  Service: Gastroenterology;  [name];   ESOPHAGOGASTRODUODENOSCOPY (EGD) WITH PROPOFOL N/A 03/14/2020   Procedure: ESOPHAGOGASTRODUODENOSCOPY (EGD) WITH PROPOFOL;  Surgeon: Virgel Manifold, MD;  [name];  Service: Endoscopy;  [name];   ESOPHAGOGASTRODUODENOSCOPY (EGD) WITH PROPOFOL N/A 02/25/2021   Procedure: ESOPHAGOGASTRODUODENOSCOPY (EGD) WITH PROPOFOL;  Surgeon: Virgel Manifold, MD;  [name];  Service: Endoscopy;  [name];    Allergies  No Known Allergies  History of Present Illness    41 year old female with a history of tobacco and alcohol abuse, alcoholic gastritis, persistent nausea and vomiting, electrolyte abnormalities, prolonged QT, ventricular tachycardia, GERD, anemia, transaminitis, neutropenia, and Kawasaki disease.  She also has a family history of premature sudden cardiac death.  She has had multiple admissions and ED visits over the past year secondary to nausea and vomiting in setting of alcoholic gastritis with associated hypokalemia and hypomagnesemia.  She had a normal EGD in August 2022.  She was admitted December 2022 secondary to ongoing nausea and vomiting.  She required potassium and magnesium supplementation and was also receiving as needed Phenergan on top of usual home dose of Remeron.  In this setting, she was noted to have frequent ventricular ectopy with recurrent nonsustained VT associated with palpitations and brief episodes of presyncope.  She was treated with lidocaine and QT prolonging medications were discontinued.  Echo during that hospitalization showed an EF of 50 to 55%.  She was placed on beta-blocker therapy and subsequently discharged with recommendation for alcohol cessation, outpatient monitoring, and ischemic evaluation.  Monitoring showed predominantly sinus rhythm/sinus tachycardia with an average rate of 106 bpm.  She had rare PACs and PVCs 1 brief run of PSVT.  She subsequently underwent exercise treadmill testing and was only able to walk for 6 minutes and 19 seconds.  She did not have any significant ST or T changes.  Unfortunately, Yvette [name] was readmitted to [facility] on September 19 after presenting with worsening  nausea and vomiting despite antiemetics at home.  She had also been experiencing URI symptoms.  In the emergency department, she was treated with droperidol for nausea  and became unresponsive with agonal breathing requiring initiation of CPR, intubation, and defibrillation, as monitoring revealed ventricular fibrillation.  She received approximate 10 minutes of CPR and 2 defibrillations before ROSC.  She was found to be COVID-positive and was admitted to the ICU.  Repeat echocardiogram showed an EF of 35 to 40% with severe anterolateral and anterior hypokinesis with normal RV function.  She recovered and was able to be extubated.  She had ongoing QT prolongation on telemetry.  GDMT was limited by borderline hypotension.  She was counseled on importance of alcohol cessation and avoidance of QT prolonging medications including antiemetics such as droperidol, ondansetron, and promethazine.  She was discharged home September 26.  Since discharge, Yvette [name] notes some memory deficits.  She had not had any alcohol but then yesterday bought a shot of liquor from the corner store.  She denies chest pain, dyspnea, palpitations, PND, orthopnea, dizziness, syncope, edema, or early satiety.  I noticed that Remeron has returned to her medicine list.  She was previously prescribed this as an appetite stimulant.  We originally discontinued this back in December 2022.  She says she just recently started taking it again but will be willing to stop.  Home Medications    Current Outpatient Medications  Medication Sig Dispense Refill   DEPO-PROVERA 150 MG/ML injection Inject 150 mg into the muscle every 3 (three) months.     mirtazapine (REMERON) 15 MG tablet Take 15 mg by mouth at bedtime.     pantoprazole (PROTONIX) 40 MG tablet Take 40 mg by mouth daily.     naltrexone (DEPADE) 50 MG tablet Take 50 mg by mouth daily.     nicotine (NICODERM CQ - DOSED IN MG/24 HOURS) 14 mg/24hr patch Place 1 patch (14 mg total) onto the skin daily. (Patient not taking: Reported on 04/15/2022) 28 patch 0   No current facility-administered medications for this visit.     Review of Systems    She  denies chest pain, palpitations, dyspnea, pnd, orthopnea, n, v, dizziness, syncope, edema, weight gain, or early satiety.  All other systems reviewed and are otherwise negative except as noted above.    Physical Exam    VS:  BP 112/70 (BP Location: Left Arm, Patient Position: Sitting, Cuff Size: Normal)   Pulse (!) 102   Ht 5\' 2"  (1.575 m)   Wt 116 lb 9.6 oz (52.9 kg)   SpO2 100%   BMI 21.33 kg/m  , BMI Body mass index is 21.33 kg/m.     GEN: Thin, in no acute distress. HEENT: normal. Neck: Supple, no JVD, carotid bruits, or masses. Cardiac: RRR, no murmurs, rubs, or gallops. No clubbing, cyanosis, edema.  Radials/PT 2+ and equal bilaterally.  Respiratory:  Respirations regular and unlabored, clear to auscultation bilaterally. GI: Soft, nontender, nondistended, BS + x 4. MS: no deformity or atrophy. Skin: warm and dry, no rash. Neuro:  Strength and sensation are intact. Psych: Normal affect.  Accessory Clinical Findings    ECG personally reviewed by me today -sinus tachycardia, 102- no acute changes.  Lab Results  Component Value Date   WBC 3.0 (L) 04/06/2022   HGB 8.9 (L) 04/06/2022   HCT 27.0 (L) 04/06/2022   MCV 80.6 04/06/2022   PLT 209 04/06/2022   Lab Results  Component Value Date   CREATININE 0.65  04/06/2022   BUN 6 04/06/2022   NA 134 (L) 04/06/2022   K 3.6 04/06/2022   CL 105 04/06/2022   CO2 23 04/06/2022   Lab Results  Component Value Date   ALT 53 (H) 04/02/2022   AST 64 (H) 04/02/2022   ALKPHOS 86 04/02/2022   BILITOT 0.5 04/02/2022   Lab Results  Component Value Date   CHOL 261 (H) 01/01/2020   TRIG 89 03/31/2022    Lab Results  Component Value Date   HGBA1C 5.6 04/01/2022    Assessment & Plan    1.  VF arrest/prolonged QT: Patient with a history of prolonged QT in the setting of chronic gastritis with nausea, vomiting, hypomagnesemia, hypokalemia, and complicated by QT prolonging antiemetics.  This initially was diagnosed in December  2022 in the setting of significant burden of nonsustained VT requiring lidocaine therapy and subsequently beta-blocker.  Low risk ETT earlier this year.  She was recently hospitalized due to recurrent nausea and vomiting and developed ventricular fibrillation/cardiac arrest while in the ED after apparent treatment with droperidol.  She required intubation.  Echo during hospitalization showed moderate reduction in EF to 35 to 40% with severe anteroseptal and anterior hypokinesis (previous EF 50-55% in December 2022).  QTc is mildly prolonged today at 461 ms.  She has had no recurrence of nausea, vomiting, presyncope, or syncope.  She was avoiding alcohol but did have a drink yesterday.  We spoke at length that the importance of complete alcohol cessation as this seems to be the foundation of her ventricular arrhythmias.  Follow-up basic metabolic panel and magnesium today.  2.  Chronic heart failure with reduced ejection fraction/nonischemic cardiomyopathy: EF of 40% in the setting of VF arrest and respiratory failure.  Euvolemic on examination.  GDMT is limited by relative hypotension.  3.  Alcohol abuse/alcoholic gastritis: See #1.  Complete alcohol cessation advised.  She did have a drink yesterday.  I provided her with community resources for alcohol cessation therapy and strongly encouraged her that she use them.  4.  Tobacco abuse: Still smoking a few cigarettes a day.  Complete cessation advised.  5.  Disposition: Follow-up in 4 to 6 weeks or sooner if necessary.  Follow-up basic metabolic panel and magnesium today.  Nicolasa Ducking, NP 04/15/2022, 1:58 PM

## 2022-04-16 ENCOUNTER — Inpatient Hospital Stay (HOSPITAL_BASED_OUTPATIENT_CLINIC_OR_DEPARTMENT_OTHER): Payer: Medicaid Other | Admitting: Medical Oncology

## 2022-04-16 ENCOUNTER — Inpatient Hospital Stay: Payer: Medicaid Other | Attending: Nurse Practitioner

## 2022-04-16 ENCOUNTER — Encounter: Payer: Self-pay | Admitting: Medical Oncology

## 2022-04-16 VITALS — BP 135/89 | HR 82 | Temp 97.6°F | Resp 16 | Wt 116.0 lb

## 2022-04-16 DIAGNOSIS — D708 Other neutropenia: Secondary | ICD-10-CM

## 2022-04-16 DIAGNOSIS — D696 Thrombocytopenia, unspecified: Secondary | ICD-10-CM

## 2022-04-16 DIAGNOSIS — K7031 Alcoholic cirrhosis of liver with ascites: Secondary | ICD-10-CM | POA: Insufficient documentation

## 2022-04-16 DIAGNOSIS — K7030 Alcoholic cirrhosis of liver without ascites: Secondary | ICD-10-CM

## 2022-04-16 LAB — CBC WITH DIFFERENTIAL/PLATELET
Abs Immature Granulocytes: 0.02 10*3/uL (ref 0.00–0.07)
Basophils Absolute: 0 10*3/uL (ref 0.0–0.1)
Basophils Relative: 1 %
Eosinophils Absolute: 0.2 10*3/uL (ref 0.0–0.5)
Eosinophils Relative: 4 %
HCT: 33.8 % — ABNORMAL LOW (ref 36.0–46.0)
Hemoglobin: 10.9 g/dL — ABNORMAL LOW (ref 12.0–15.0)
Immature Granulocytes: 1 %
Lymphocytes Relative: 22 %
Lymphs Abs: 0.9 10*3/uL (ref 0.7–4.0)
MCH: 26.4 pg (ref 26.0–34.0)
MCHC: 32.2 g/dL (ref 30.0–36.0)
MCV: 81.8 fL (ref 80.0–100.0)
Monocytes Absolute: 0.4 10*3/uL (ref 0.1–1.0)
Monocytes Relative: 8 %
Neutro Abs: 2.8 10*3/uL (ref 1.7–7.7)
Neutrophils Relative %: 64 %
Platelets: 306 10*3/uL (ref 150–400)
RBC: 4.13 MIL/uL (ref 3.87–5.11)
RDW: 16.6 % — ABNORMAL HIGH (ref 11.5–15.5)
WBC: 4.3 10*3/uL (ref 4.0–10.5)
nRBC: 0 % (ref 0.0–0.2)

## 2022-04-16 LAB — COMPREHENSIVE METABOLIC PANEL
ALT: 16 U/L (ref 0–44)
AST: 28 U/L (ref 15–41)
Albumin: 4.3 g/dL (ref 3.5–5.0)
Alkaline Phosphatase: 79 U/L (ref 38–126)
Anion gap: 9 (ref 5–15)
BUN: 8 mg/dL (ref 6–20)
CO2: 23 mmol/L (ref 22–32)
Calcium: 9.6 mg/dL (ref 8.9–10.3)
Chloride: 103 mmol/L (ref 98–111)
Creatinine, Ser: 0.81 mg/dL (ref 0.44–1.00)
GFR, Estimated: >60 mL/min (ref >60)
Glucose, Bld: 91 mg/dL (ref 70–99)
Potassium: 4.2 mmol/L (ref 3.5–5.1)
Sodium: 135 mmol/L (ref 135–145)
Total Bilirubin: 0.2 mg/dL — ABNORMAL LOW (ref 0.3–1.2)
Total Protein: 8.4 g/dL — ABNORMAL HIGH (ref 6.5–8.1)

## 2022-04-16 LAB — LACTATE DEHYDROGENASE: LDH: 191 U/L (ref 98–192)

## 2022-04-16 NOTE — Telephone Encounter (Signed)
Advised patient that this would not interfere with her heart condition.

## 2022-04-16 NOTE — Telephone Encounter (Signed)
Pt c/o medication issue:  1. Name of Medication:  Naltrexone 50 MG  2. How are you currently taking this medication (dosage and times per day)?   3. Are you having a reaction (difficulty breathing--STAT)?   4. What is your medication issue?    Patient is following up. She states she has one more question for the nurse--Naltrexone Hydrochloride 50 MG was prescribed by her PCP. She took her first dose last night. Patient states she went online and researched it and read that it is an opioid. She would like to confirm this will not interfere with her heart condition.

## 2022-04-16 NOTE — Progress Notes (Signed)
[facility] OFFICE PROGRESS NOTE  Patient Care Team: Center, [facility] as PCP - General Kirke Corin, Chelsea Aus, MD as PCP - Cardiology (Cardiology)   SUMMARY OF ONCOLOGIC HISTORY:  # NOV 2015 WBC- 2.2/ANC- 1200/ALC-500; [name]; 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 Normal cytogenetics  # OCT 2016- Bil indeterminate lung nodules-CT  In July 2017- Improving.   # Hx of Kawasaki disease [age 38M & 13y]  # Alcohol abuse/Smoking  INTERVAL HISTORY:  A pleasant 41 year-old African-American female patient with above history of chronic mild leukopenia/neutropenia is here for follow-up.  Patient states that a lot has changed since her last visit.  She has been hospitalized twice secondary to electrolyte disturbance and nutritional deficiencies due to her alcoholism.  In September she was in the emergency room for nausea and vomiting secondary to electrolyte abnormalities where she experienced cardiac arrest.  She reports that this hospitalization was a big wake-up call for her.  She has been working closely with cardiology as well as her PCP to both become sober and improve her health.  She was originally started on mirtazapine by her PCP to help her quit drinking however there however her cardiologist stopped this medication and she is now on naltrexone 50 mg which she started yesterday.  She reports that she has been 7 days sober and is feeling emotionally very out of herself with less depression and she states that she is strongly motivated to improve her health.  Her next cardiology follow-up is in 6 weeks.   In terms of her neutropenia she reports no recent illness, fevers or lymphadenopathy.  No unintentional weight loss or night sweats.  Wt Readings from Last 3 Encounters:  04/16/22 116 lb (52.6 kg)  04/15/22 116 lb 9.6 oz (52.9 kg)  04/06/22 115 lb  4.8 oz (52.3 kg)     Review of Systems  Constitutional:  Negative for chills, diaphoresis, fever, [name], hemoptysis, sputum production, [name], palpitations, orthopnea and leg swelling.  Gastrointestinal:  Negative for abdominal pain, blood in stool, constipation, diarrhea, heartburn, melena, nausea and vomiting.  Genitourinary:  Negative for dysuria, frequency and urgency.  Musculoskeletal:  Negative for back pain and joint pain.  Skin: Negative.  Negative for itching and rash.  Neurological:  Negative for dizziness, tingling, focal weakness, weakness and headaches.  Endo/Heme/Allergies:  Does not bruise/bleed easily.  Psychiatric/Behavioral:  Negative for depression. The patient is not nervous/anxious and does not have insomnia.     PAST MEDICAL HISTORY :  Past Medical History:  Diagnosis Date   Asthma    Cardiac arrest (HCC)    ETOH abuse    H/O Kawasaki's disease    as 39 month old child and at 41 years   History of anemia    History of blood transfusion 41   Hypokalemia    Hypomagnesemia    a. in the setting of n/v->gastritis/etoh.   Leukopenia    Neutropenia (HCC)    NICM (nonischemic cardiomyopathy) (HCC)    a. 06/2021 Echo: EF 50-55%, [name]; b. 09/2021 ETT: Ex time 6:19, [name], [name]; c. 03/2022 Echo: EF 35-40%, [name].  NSVT (nonsustained ventricular tachycardia) ([place])    a. Noted during admission 06/2021 assoc w/ palpitations and presyncope in the setting of profound QT prolongation/gastritis.   Tobacco abuse    Transaminitis     PAST SURGICAL HISTORY :   Past Surgical History:  Procedure Laterality Date   ESOPHAGOGASTRODUODENOSCOPY N/A 10/29/2020   Procedure: ESOPHAGOGASTRODUODENOSCOPY (EGD);  Surgeon:  Jonathon Bellows, MD;  Location: [facility] ENDOSCOPY;  Service: Gastroenterology;  [name];   ESOPHAGOGASTRODUODENOSCOPY (EGD) WITH PROPOFOL N/A 03/14/2020   Procedure: ESOPHAGOGASTRODUODENOSCOPY (EGD) WITH PROPOFOL;  Surgeon: Virgel Manifold, MD;  [name];  Service: Endoscopy;  [name];   ESOPHAGOGASTRODUODENOSCOPY (EGD) WITH PROPOFOL N/A 02/25/2021   Procedure: ESOPHAGOGASTRODUODENOSCOPY (EGD) WITH PROPOFOL;  Surgeon: Virgel Manifold, MD;  [name];  Service: Endoscopy;  [name];    FAMILY HISTORY :   Family History  Problem Relation Age of Onset   Lupus Mother    Prostate cancer Father    Cancer Paternal Aunt        Breast    Cancer Paternal Aunt        Breast     SOCIAL HISTORY:   Social History   Tobacco Use   Smoking status: Every Day    Packs/day: 0.25    Years: 10.00    Total pack years: 2.50    Types: Cigarettes   Smokeless tobacco: Never  Vaping Use   Vaping Use: Never used  Substance Use Topics   Alcohol use: Yes    Alcohol/week: 61.0 standard drinks of alcohol    Types: 21 Cans of beer, [name]   Drug use: No    ALLERGIES:  has No Known Allergies.  MEDICATIONS:  Current Outpatient Medications  Medication Sig Dispense Refill   DEPO-PROVERA 150 MG/ML injection Inject 150 mg into the muscle every 3 (three) months.     naltrexone (DEPADE) 50 MG tablet Take 50 mg by mouth daily.     pantoprazole (PROTONIX) 40 MG tablet Take 40 mg by mouth daily.     nicotine (NICODERM CQ - DOSED IN MG/24 HOURS) 14 mg/24hr patch Place 1 patch (14 mg total) onto the skin daily. (Patient not taking: Reported on 04/15/2022) 28 patch 0   No current facility-administered medications for this visit.    PHYSICAL EXAMINATION: ECOG PERFORMANCE STATUS: 0 - Asymptomatic  BP 135/89 (BP Location: Left Arm, Patient Position: Sitting)   Pulse 82   Temp 97.6 F (36.4 C) (Tympanic)    Resp 16   Wt 116 lb (52.6 kg)   BMI 21.22 kg/m   Filed Weights   04/16/22 1143  Weight: 116 lb (52.6 kg)    Physical Exam HENT:     Head: Normocephalic and atraumatic.     Mouth/Throat:     Pharynx: No oropharyngeal exudate.  Eyes:     Pupils: Pupils are equal, round, and reactive to light.  Cardiovascular:     Rate and Rhythm: Normal rate and regular rhythm.  Pulmonary:     Effort: No respiratory distress.     Breath sounds: No wheezing.  Abdominal:     General: Bowel sounds are normal. There is no distension.     Palpations: Abdomen is soft. There is no mass.     Tenderness: There is no abdominal tenderness. There is no guarding or rebound.  Musculoskeletal:        General: No tenderness. Normal range of motion.  Cervical back: Normal range of motion and neck supple.  Skin:    General: Skin is warm.  Neurological:     Mental Status: She is alert and oriented to person, place, and time.     Comments: No tremors  Psychiatric:        Mood and Affect: Affect normal.      LABORATORY DATA:  I have reviewed the data as listed    Component Value Date/Time   NA 135 04/16/2022 1044   NA 141 02/05/2021 1334   K 4.2 04/16/2022 1044   CL 103 04/16/2022 1044   CO2 23 04/16/2022 1044   GLUCOSE 91 04/16/2022 1044   BUN 8 04/16/2022 1044   BUN 7 02/05/2021 1334   CREATININE 0.81 04/16/2022 1044   CALCIUM 9.6 04/16/2022 1044   PROT 8.4 (H) 04/16/2022 1044   PROT 8.0 02/05/2021 1334   ALBUMIN 4.3 04/16/2022 1044   ALBUMIN 4.9 (H) 02/05/2021 1334   AST 28 04/16/2022 1044   ALT 16 04/16/2022 1044   ALKPHOS 79 04/16/2022 1044   BILITOT 0.2 (L) 04/16/2022 1044   BILITOT 0.2 02/05/2021 1334   GFRNONAA >60 04/16/2022 1044   GFRAA >60 11/15/2019 2054    No results found for: "SPEP", "UPEP"  Lab Results  Component Value Date   WBC 4.3 04/16/2022   NEUTROABS 2.8 04/16/2022   HGB 10.9 (L) 04/16/2022   HCT 33.8 (L) 04/16/2022   MCV 81.8 04/16/2022   PLT 306  04/16/2022      Chemistry      Component Value Date/Time   NA 135 04/16/2022 1044   NA 141 02/05/2021 1334   K 4.2 04/16/2022 1044   CL 103 04/16/2022 1044   CO2 23 04/16/2022 1044   BUN 8 04/16/2022 1044   BUN 7 02/05/2021 1334   CREATININE 0.81 04/16/2022 1044   GLU 76 01/01/2020 1447      Component Value Date/Time   CALCIUM 9.6 04/16/2022 1044   ALKPHOS 79 04/16/2022 1044   AST 28 04/16/2022 1044   ALT 16 04/16/2022 1044   BILITOT 0.2 (L) 04/16/2022 1044   BILITOT 0.2 02/05/2021 1334       RADIOGRAPHIC STUDIES: I have personally reviewed the radiological images as listed and agreed with the findings in the report. No results found.   ASSESSMENT & PLAN:  Encounter Diagnoses  Name Primary?   Other neutropenia (HCC) Yes   Alcoholic cirrhosis of liver without ascites (HCC)    Thrombocytopenia (HCC)    Neutropenia: Felt to be secondary to EtOH intake.  Her labs today show improvement including her white blood cell count of 4.3 and an ANC of 2.8.  This is reassuring news the thought process that this is secondary to alcoholism.  Encourage patient to continue her sobriety.  Alcoholic cirrhosis with ascites: Chronic in nature.  She will continue to follow-up with her PCP, GI and cardiology regarding the effects of her chronic alcohol use.  Informed patient that I was very proud of her for the staff she is taking to help improve her health Chronic in nature and improved with reduced alcohol intake.  Further confirms that the process is secondary to her EtOH intake and cirrhosis.  Disposition:  RTC 3 months for MD/labs (CMP, CBC w/, folate, B12, iron, ferritin    Rushie Chestnut, PA-C 04/16/2022 2:21 PM

## 2022-04-16 NOTE — Progress Notes (Signed)
Pt in for follow up, reports she was hospitalized in Sept, went into cardiac arrest, pt is being followed by cardiologist.  Pt states she "had quit drinking in September but slipped up two times".

## 2022-04-26 ENCOUNTER — Telehealth: Payer: Self-pay | Admitting: Cardiovascular Disease

## 2022-04-26 NOTE — Telephone Encounter (Signed)
Patient called stating her PCP wants to put her on anti-anxiety medication.  PCP wants to make sure medication won't affect her heart.

## 2022-04-27 ENCOUNTER — Ambulatory Visit: Payer: Medicaid Other | Admitting: Nurse Practitioner

## 2022-04-27 NOTE — Telephone Encounter (Signed)
Antianxiety medication okay provided that there is no risk of QT prolongation.

## 2022-04-27 NOTE — Telephone Encounter (Signed)
Pt is returning call.  

## 2022-04-27 NOTE — Telephone Encounter (Signed)
I spoke with the patient. She was recently seen by her PCP at [facility].  She was told to reach out to [place] to see if there was any contraindication to them placing her on something for anxiety. Per the patient, no specific drug was mentioned.   I advised the patient a message will be sent to her providers to review and we will call her back with recommendations once received.  The patient voices understanding and is agreeable.  She was last seen by Ignacia Bayley, NP on 10/523.  Forwarding to [place] to review.

## 2022-04-27 NOTE — Telephone Encounter (Signed)
No answer and voicemail box is full.  

## 2022-04-29 NOTE — Telephone Encounter (Signed)
Spoke with patient and had lengthy conversation regarding provider recommendations. She verbalized understanding with no further questions. Confirmed upcoming appointment for 05/27/22 at 2 pm.

## 2022-05-27 ENCOUNTER — Encounter: Payer: Self-pay | Admitting: Nurse Practitioner

## 2022-05-27 ENCOUNTER — Ambulatory Visit: Payer: Medicaid Other | Attending: Nurse Practitioner | Admitting: Nurse Practitioner

## 2022-05-27 VITALS — BP 114/80 | HR 85 | Ht 61.0 in | Wt 115.0 lb

## 2022-05-27 DIAGNOSIS — K2920 Alcoholic gastritis without bleeding: Secondary | ICD-10-CM

## 2022-05-27 DIAGNOSIS — Z720 Tobacco use: Secondary | ICD-10-CM

## 2022-05-27 DIAGNOSIS — I4729 Other ventricular tachycardia: Secondary | ICD-10-CM | POA: Diagnosis not present

## 2022-05-27 DIAGNOSIS — R9431 Abnormal electrocardiogram [ECG] [EKG]: Secondary | ICD-10-CM | POA: Diagnosis not present

## 2022-05-27 DIAGNOSIS — F1010 Alcohol abuse, uncomplicated: Secondary | ICD-10-CM

## 2022-05-27 DIAGNOSIS — I428 Other cardiomyopathies: Secondary | ICD-10-CM

## 2022-05-27 NOTE — Progress Notes (Signed)
Office Visit    Patient Name: Yvette [name] Date of Encounter: 05/27/2022  Primary Care Provider:  [facility] Primary Cardiologist:  Lorine Bears, MD  Chief Complaint    41 y/o ? with a history of tobacco and alcohol abuse, alcoholic gastritis, persistent nausea and vomiting, electrolyte abnormalities, prolonged QT, ventricular tachycardia, GERD, anemia, transaminitis, neutropenia, and Kawasaki disease, who presents for follow-up of prolonged QT and ventricular arrhythmias.  Past Medical History    Past Medical History:  Diagnosis Date   Asthma    Cardiac arrest (HCC)    ETOH abuse    H/O Kawasaki's disease    as 66 month old child and at 77 years   History of anemia    History of blood transfusion 1984   Hypokalemia    Hypomagnesemia    a. in the setting of n/v->gastritis/etoh.   Leukopenia    Neutropenia (HCC)    NICM (nonischemic cardiomyopathy) (HCC)    a. 06/2021 Echo: EF 50-55%, [name]; b. 09/2021 ETT: Ex time 6:19, [name], [name]; c. 03/2022 Echo: EF 35-40%, [name].   NSVT (nonsustained ventricular tachycardia) (HCC)    a. Noted during admission 06/2021 assoc w/ palpitations and presyncope in the setting of profound QT prolongation/gastritis.   Tobacco abuse    Transaminitis    Past Surgical History:  Procedure Laterality Date   ESOPHAGOGASTRODUODENOSCOPY N/A 10/29/2020   Procedure: ESOPHAGOGASTRODUODENOSCOPY (EGD);  Surgeon: Wyline Mood, MD;  Location: [facility] ENDOSCOPY;  Service: Gastroenterology;  [name];   ESOPHAGOGASTRODUODENOSCOPY (EGD) WITH PROPOFOL N/A 03/14/2020   Procedure: ESOPHAGOGASTRODUODENOSCOPY (EGD) WITH PROPOFOL;  Surgeon: Pasty Spillers, MD;  [name];  Service: Endoscopy;  [name];   ESOPHAGOGASTRODUODENOSCOPY (EGD) WITH PROPOFOL N/A 02/25/2021   Procedure: ESOPHAGOGASTRODUODENOSCOPY (EGD) WITH PROPOFOL;  Surgeon: Pasty Spillers, MD;   [name];  Service: Endoscopy;  [name];    Allergies  No Known Allergies  History of Present Illness    41 year old female with the above past medical history including tobacco and alcohol abuse, alcoholic gastritis, persistent nausea and vomiting, electrolyte abnormalities, prolonged QT, ventricular tachycardia, GERD, anemia, transaminitis, neutropenia, and Kawasaki disease.  She also has a family history of premature sudden cardiac death.  She has had multiple admissions and ED visits over the past year secondary to nausea and vomiting in setting of alcoholic gastritis with associated hypokalemia and hypomagnesemia.  She had a normal EGD in August 2022.  In December 2022, she was admitted with ongoing nausea and vomiting.  She required potassium magnesium supplementation and also received as needed Phenergan on top of usual home dose of Remeron.  In the setting, she was noted to have frequent ventricular ectopy with recurrent nonsustained VT associated palpitations and brief episodes of presyncope.  She was treated with intravenous lidocaine and QT prolonging medications were discontinued.  Echo during that hospitalization showed an EF of 50-55%.  She was placed on beta-blocker therapy and subsequently discharged with recommendation for alcohol cessation, outpatient monitoring, and ischemic evaluation.  Monitoring showed predominantly sinus rhythm/sinus tachycardia with an average rate of 106 bpm.  She had rare PACs and PVCs with 1 brief run of PSVT.  She subsequently underwent exercise treadmill testing and was only able to walk for 6 minutes and 19 seconds.  She did not have any significant ST or T changes.  Yvette [name] was readmitted to [facility] in September 2023 after presenting with worsening  nausea and vomiting despite antiemetics at home.  She had also been experiencing URI symptoms.  In the emergency department, she was treated with droperidol for nausea and  became unresponsive with agonal breathing requiring intubation, CPR, and defibrillation, as monitoring revealed ventricular fibrillation.  She received approximately 10 minutes of CPR and 2 defibrillations before ROSC.  She was found to be COVID-positive and was admitted to ICU.  Repeat echocardiogram showed an EF of 35 to 40% with severe anterolateral and anterior hypokinesis with normal RV function.  She recovered and was able to be extubated.  She had ongoing QT prolongation on telemetry.  GDMT was limited by borderline hypotension.  She was counseled importance of alcohol cessation and avoidance of QT prolonging medications including antiemetics such as droperidol, ondansetron, and promethazine.  She was discharged home September 26.  Yvette [name] was last seen in cardiology clinic on October 5, at which time she reported having 1 drink since discharge.  QTc was mildly prolonged at 461 ms.  Lab work showed a potassium of 4.2 with magnesium of 1.8.  Since her last visit, she has done well.  She is now on naltrexone and with this, has noted significant reduction in craving for alcohol.  She had a 24 ounce beer last week and another one this past Monday.  Otherwise, she has not been drinking.  She has no alcohol in her home and her family does not use alcohol around her.  She feels very well supported in her admission to continually cut back and quit.  She has had a few episodes of mild nausea but has not required Zofran and has had no vomiting.  We discussed today the importance of avoiding Zofran and medications like it in the setting of history of prolonged QT.  She denies chest pain, dyspnea, palpitations, PND, orthopnea, dizziness, syncope, edema, or early satiety.  She notes an improved outlook and is hoping to get a part-time job once her anxiety and depression improved some more (recently started on Pristiq).  Home Medications    Current Outpatient Medications  Medication Sig Dispense Refill    DEPO-PROVERA 150 MG/ML injection Inject 150 mg into the muscle every 3 (three) months.     Desvenlafaxine ER (PRISTIQ) 50 MG TB24 Take 1 tablet by mouth daily.     MACROBID 100 MG capsule Take 100 mg by mouth 2 (two) times daily.     naltrexone (DEPADE) 50 MG tablet Take 50 mg by mouth daily.     pantoprazole (PROTONIX) 40 MG tablet Take 40 mg by mouth daily.     No current facility-administered medications for this visit.     Review of Systems    With reduction in drinking, she has noted some increase in anxiety and depression which is being managed by her primary care provider.  She denies chest pain, palpitations, dyspnea, pnd, orthopnea, n, v, dizziness, syncope, edema, weight gain, or early satiety.  All other systems reviewed and are otherwise negative except as noted above.    Physical Exam    VS:  BP 114/80 (BP Location: Left Arm, Patient Position: Sitting, Cuff Size: Normal)   Pulse 85   Ht 5\' 1"  (1.549 m)   Wt 115 lb (52.2 kg)   SpO2 99%   BMI 21.73 kg/m  , BMI Body mass index is 21.73 kg/m.     GEN: Thin, in no acute distress. HEENT: normal. Neck: Supple, no JVD, carotid bruits, or masses. Cardiac: RRR, no murmurs, rubs, or gallops.  No clubbing, cyanosis, edema.  Radials/PT 2+ and equal bilaterally.  Respiratory:  Respirations regular and unlabored, clear to auscultation bilaterally. GI: Soft, nontender, nondistended, BS + x 4. MS: no deformity or atrophy. Skin: warm and dry, no rash. Neuro:  Strength and sensation are intact. Psych: Normal affect.  Accessory Clinical Findings    ECG personally reviewed by me today -regular sinus rhythm, 85, no acute ST-T changes.  QTc 421.  Lab Results  Component Value Date   WBC 4.3 04/16/2022   HGB 10.9 (L) 04/16/2022   HCT 33.8 (L) 04/16/2022   MCV 81.8 04/16/2022   PLT 306 04/16/2022   Lab Results  Component Value Date   CREATININE 0.81 04/16/2022   BUN 8 04/16/2022   NA 135 04/16/2022   K 4.2 04/16/2022   CL 103  04/16/2022   CO2 23 04/16/2022   Lab Results  Component Value Date   ALT 16 04/16/2022   AST 28 04/16/2022   ALKPHOS 79 04/16/2022   BILITOT 0.2 (L) 04/16/2022   Lab Results  Component Value Date   CHOL 261 (H) 01/01/2020   TRIG 89 03/31/2022    Lab Results  Component Value Date   HGBA1C 5.6 04/01/2022    Assessment & Plan    1.  VF arrest/polymorphic ventricular tachycardia/prolonged QT: Patient with a history of prolonged QT in the setting of chronic alcoholic gastritis with nausea, vomiting, hypomagnesemia, and hypokalemia, complicated by QT prolonging antiemetic medications.  This was initially diagnosed in December 2022 during hospitalization at which she required intravenous lidocaine.  Subsequent stress testing was low risk and echo showed an EF of 50-55%.  She was hospitalized in September 2022 with recurrent nausea and vomiting and developed ventricular fibrillation/cardiac arrest while in the emergency department after treatment with droperidol.  She required intubation and CPR/defibrillation.  She was noted to be COVID-positive at that time.  Since her discharge and her last visit, she has done well.  She has cut back significantly on alcohol in the setting of naltrexone therapy and notes that she had a 24 ounce beer last week and a second 1 this past Monday.  This is a significant reduction in alcohol from months past.  She understands that complete cessation will be necessary to avoid worsening of gastritis with nausea and vomiting.  Strongly encouraged to avoid QT prolonging agents such as Zofran, which she has at home.  We have a long discussion about the connection between her alcoholism, electrolyte disturbances, and QT prolongation/ventricular arrhythmias.  2.  Chronic heart failure with reduced ejection fraction/nonischemic cardiomyopathy: EF 40% in the setting of VF arrest and respiratory failure in September.  Euvolemic on examination and asymptomatic at home.  She is not  on any GDMT in the setting of soft blood pressures with history of relative hypotension.  I will arrange for follow-up limited echo as I suspect EF may have improved with time since event and reduction in alcohol.  3.  Alcohol abuse/alcoholic gastritis: See #1.  She has cut back to 124 ounce beer a week and I have advised complete cessation.  She feels that she has good support from her family to help her quit completely.  4.  Tobacco abuse: Still smoking a few cigarettes a day.  Complete cessation advised.  5.  Disposition: Follow-up limited echo to reevaluate EF.  Follow-up in clinic in 3 months or sooner if necessary.   Nicolasa Ducking, NP 05/27/2022, 3:24 PM

## 2022-05-27 NOTE — Patient Instructions (Signed)
Medication Instructions:  No changes at this time.   *If you need a refill on your cardiac medications before your next appointment, please call your pharmacy*   Lab Work: None  If you have labs (blood work) drawn today and your tests are completely normal, you will receive your results only by: MyChart Message (if you have MyChart) OR A paper copy in the mail If you have any lab test that is abnormal or we need to change your treatment, we will call you to review the results.   Testing/Procedures: Your physician has requested that you have an echocardiogram. Echocardiography is a painless test that uses sound waves to create images of your heart. It provides your doctor with information about the size and shape of your heart and how well your heart's chambers and valves are working. This procedure takes approximately one hour. There are no restrictions for this procedure. Please do NOT wear cologne, perfume, aftershave, or lotions (deodorant is allowed). Please arrive 15 minutes prior to your appointment time.    Follow-Up: At [facility], you and your health needs are our priority.  As part of our continuing mission to provide you with exceptional heart care, we have created designated Provider Care Teams.  These Care Teams include your primary Cardiologist (physician) and Advanced Practice Providers (APPs -  Physician Assistants and Nurse Practitioners) who all work together to provide you with the care you need, when you need it.  We recommend signing up for the patient portal called "MyChart".  Sign up information is provided on this After Visit Summary.  MyChart is used to connect with patients for Virtual Visits (Telemedicine).  Patients are able to view lab/test results, encounter notes, upcoming appointments, etc.  Non-urgent messages can be sent to your provider as well.   To learn more about what you can do with MyChart, go to [email or web address].    Your next  appointment:   3 month(s)  The format for your next appointment:   In Person  Provider:   Lorine Bears, MD or Nicolasa Ducking, NP        Important Information About Sugar

## 2022-06-21 ENCOUNTER — Encounter: Payer: Self-pay | Admitting: Emergency Medicine

## 2022-06-21 ENCOUNTER — Emergency Department
Admission: EM | Admit: 2022-06-21 | Discharge: 2022-06-21 | Disposition: A | Discharge status: Home / Self Care | Payer: Medicaid Other | Attending: Emergency Medicine | Admitting: Emergency Medicine

## 2022-06-21 ENCOUNTER — Other Ambulatory Visit: Payer: Self-pay

## 2022-06-21 DIAGNOSIS — D649 Anemia, unspecified: Secondary | ICD-10-CM | POA: Diagnosis not present

## 2022-06-21 DIAGNOSIS — R197 Diarrhea, unspecified: Secondary | ICD-10-CM | POA: Insufficient documentation

## 2022-06-21 DIAGNOSIS — R1010 Upper abdominal pain, unspecified: Secondary | ICD-10-CM | POA: Insufficient documentation

## 2022-06-21 DIAGNOSIS — R112 Nausea with vomiting, unspecified: Secondary | ICD-10-CM | POA: Diagnosis not present

## 2022-06-21 LAB — URINALYSIS, ROUTINE W REFLEX MICROSCOPIC
Bilirubin Urine: NEGATIVE
Glucose, UA: NEGATIVE mg/dL
Ketones, ur: 5 mg/dL — AB
Leukocytes,Ua: NEGATIVE
Nitrite: NEGATIVE
Protein, ur: >=300 mg/dL — AB
Specific Gravity, Urine: 1.029 (ref 1.005–1.030)
pH: 5 (ref 5.0–8.0)

## 2022-06-21 LAB — COMPREHENSIVE METABOLIC PANEL
ALT: 14 U/L (ref 0–44)
AST: 32 U/L (ref 15–41)
Albumin: 5.2 g/dL — ABNORMAL HIGH (ref 3.5–5.0)
Alkaline Phosphatase: 57 U/L (ref 38–126)
Anion gap: 14 (ref 5–15)
BUN: 17 mg/dL (ref 6–20)
CO2: 20 mmol/L — ABNORMAL LOW (ref 22–32)
Calcium: 10.1 mg/dL (ref 8.9–10.3)
Chloride: 105 mmol/L (ref 98–111)
Creatinine, Ser: 0.8 mg/dL (ref 0.44–1.00)
GFR, Estimated: >60 mL/min (ref >60)
Glucose, Bld: 103 mg/dL — ABNORMAL HIGH (ref 70–99)
Potassium: 3.5 mmol/L (ref 3.5–5.1)
Sodium: 139 mmol/L (ref 135–145)
Total Bilirubin: 0.4 mg/dL (ref 0.3–1.2)
Total Protein: 9.9 g/dL — ABNORMAL HIGH (ref 6.5–8.1)

## 2022-06-21 LAB — CBC
HCT: 39.1 % (ref 36.0–46.0)
Hemoglobin: 11.3 g/dL — ABNORMAL LOW (ref 12.0–15.0)
MCH: 22.2 pg — ABNORMAL LOW (ref 26.0–34.0)
MCHC: 28.9 g/dL — ABNORMAL LOW (ref 30.0–36.0)
MCV: 76.7 fL — ABNORMAL LOW (ref 80.0–100.0)
Platelets: 288 10*3/uL (ref 150–400)
RBC: 5.1 MIL/uL (ref 3.87–5.11)
RDW: 16.7 % — ABNORMAL HIGH (ref 11.5–15.5)
WBC: 7.2 10*3/uL (ref 4.0–10.5)
nRBC: 0 % (ref 0.0–0.2)

## 2022-06-21 LAB — LIPASE, BLOOD: Lipase: 25 U/L (ref 11–51)

## 2022-06-21 LAB — ETHANOL: Alcohol, Ethyl (B): <10 mg/dL (ref <10)

## 2022-06-21 LAB — URINE DRUG SCREEN, QUALITATIVE (ARMC ONLY)
Amphetamines, Ur Screen: NOT DETECTED
Barbiturates, Ur Screen: NOT DETECTED
Benzodiazepine, Ur Scrn: NOT DETECTED
Cannabinoid 50 Ng, Ur ~~LOC~~: NOT DETECTED
Cocaine Metabolite,Ur ~~LOC~~: NOT DETECTED
MDMA (Ecstasy)Ur Screen: NOT DETECTED
Methadone Scn, Ur: NOT DETECTED
Opiate, Ur Screen: NOT DETECTED
Phencyclidine (PCP) Ur S: NOT DETECTED
Tricyclic, Ur Screen: NOT DETECTED

## 2022-06-21 LAB — POC URINE PREG, ED: Preg Test, Ur: NEGATIVE

## 2022-06-21 LAB — TROPONIN I (HIGH SENSITIVITY): Troponin I (High Sensitivity): 4 ng/L (ref <18)

## 2022-06-21 MED ORDER — SODIUM CHLORIDE 0.9 % IV BOLUS
1000.0000 mL | Freq: Once | INTRAVENOUS | Status: AC
  Administered 2022-06-21: 1000 mL via INTRAVENOUS

## 2022-06-21 MED ORDER — METOCLOPRAMIDE HCL 5 MG/ML IJ SOLN
10.0000 mg | Freq: Once | INTRAMUSCULAR | Status: AC
  Administered 2022-06-21: 10 mg via INTRAVENOUS
  Filled 2022-06-21: qty 2

## 2022-06-21 NOTE — ED Triage Notes (Signed)
Pt in with co n.v.d. for 2 days along with abd pain.

## 2022-06-21 NOTE — ED Provider Notes (Signed)
[facility] Provider Note    Event Date/Time   First MD Initiated Contact with Patient 06/21/22 1543     (approximate)   History   Vomiting   HPI  CLEMENCIA HELZER is a 41 y.o. female past medical history significant for alcohol abuse, presents to the emergency department with nausea and vomiting.  Endorses nausea and vomiting has been ongoing since Saturday.  Multiple episodes of diarrhea that has been nonbloody.  Upper abdominal pain that was cramping but is no longer having abdominal pain at this time.  No fever or chills.  Denies any marijuana use.  Last drink of alcohol was a couple of days ago.  Prior endoscopy and was diagnosed with cirrhosis.  Recent admission for cardiac arrest secondary to cardiac dysrhythmias from prolonged QTc.     Physical Exam   Triage Vital Signs: ED Triage Vitals [06/21/22 1524]  Enc Vitals Group     BP (!) 136/91     Pulse Rate 79     Resp 18     Temp 99.1 F (37.3 C)     Temp Source Oral     SpO2 100 %     Weight 116 lb (52.6 kg)     Height 5\' 1"  (1.549 m)     Head Circumference      Peak Flow      Pain Score 7     Pain Loc      Pain Edu?      Excl. in GC?     Most recent vital signs: Vitals:   06/21/22 1524 06/21/22 1843  BP: (!) 136/91 130/88  Pulse: 79 80  Resp: 18 18  Temp: 99.1 F (37.3 C) 98 F (36.7 C)  SpO2: 100% 100%    Physical Exam Constitutional:      Appearance: She is well-developed.     Comments: Thin female  HENT:     Head: Atraumatic.     Comments: Temporal wasting Eyes:     Conjunctiva/sclera: Conjunctivae normal.  Cardiovascular:     Rate and Rhythm: Regular rhythm.  Pulmonary:     Effort: No respiratory distress.  Abdominal:     General: There is no distension.     Tenderness: There is no abdominal tenderness. There is no guarding.  Musculoskeletal:        General: Normal range of motion.     Cervical back: Normal range of motion.  Skin:    General: Skin is warm.      Capillary Refill: Capillary refill takes less than 2 seconds.  Neurological:     Mental Status: She is alert. Mental status is at baseline.  Psychiatric:        Mood and Affect: Mood normal.     IMPRESSION / MDM / ASSESSMENT AND PLAN / ED COURSE  I reviewed the triage vital signs and the nursing notes.  Differential diagnosis including pancreatitis, hyperemesis syndrome, electrolyte abnormality, dehydration, alcohol intoxication, ACS, pyelonephritis  On chart review of last visit patient had multiple doses of antiemetics and droperidol and then went into cardiac arrest, successfully cardioverted.   Labs (all labs ordered are listed, but only abnormal results are displayed) Labs interpreted as -  Chronic anemia.  No significant leukocytosis.  No significant electrolyte abnormalities.  Alcohol level is negative.  Lipase normal.  Labs Reviewed  CBC - Abnormal; Notable for the following components:      Result Value   Hemoglobin 11.3 (*)    MCV  76.7 (*)    MCH 22.2 (*)    MCHC 28.9 (*)    RDW 16.7 (*)    All other components within normal limits  COMPREHENSIVE METABOLIC PANEL - Abnormal; Notable for the following components:   CO2 20 (*)    Glucose, Bld 103 (*)    Total Protein 9.9 (*)    Albumin 5.2 (*)    All other components within normal limits  URINALYSIS, ROUTINE W REFLEX MICROSCOPIC - Abnormal; Notable for the following components:   Color, Urine YELLOW (*)    APPearance HAZY (*)    Hgb urine dipstick SMALL (*)    Ketones, ur 5 (*)    Protein, ur >=300 (*)    Bacteria, UA RARE (*)    All other components within normal limits  LIPASE, BLOOD  ETHANOL  URINE DRUG SCREEN, QUALITATIVE ([facility] ONLY)  POC URINE PREG, ED  TROPONIN I (HIGH SENSITIVITY)  TROPONIN I (HIGH SENSITIVITY)      Given IV fluids and IV antiemetics, and reevaluation tolerating p.o.  Repeat abdominal exam is benign and nontender have a low suspicion for acute appendicitis, cholelithiasis or other  acute intra-abdominal pathology.  Do not feel that CT scan of the abdomen and pelvis is necessary at this time.  Patient will follow-up tomorrow with primary care provider and given return precautions for worsening symptoms.  States that she will also follow-up for alcohol use disorder.   PROCEDURES:  Critical Care performed: No  Procedures  Patient's presentation is most consistent with acute presentation with potential threat to life or bodily function.   MEDICATIONS ORDERED IN ED: Medications  sodium chloride 0.9 % bolus 1,000 mL (0 mLs Intravenous Stopped 06/21/22 1925)  metoCLOPramide (REGLAN) injection 10 mg (10 mg Intravenous Given 06/21/22 1738)    FINAL CLINICAL IMPRESSION(S) / ED DIAGNOSES   Final diagnoses:  Nausea and vomiting, unspecified vomiting type  Anemia, unspecified type     Rx / DC Orders   ED Discharge Orders     None        Note:  This document was prepared using Dragon voice recognition software and may include unintentional dictation errors.   Corena Herter, MD 06/21/22 1929

## 2022-06-21 NOTE — ED Notes (Signed)
First Nurse Note: Pt to ED via [facility] from home for N/V. Pt VSS. Pt is in NAD at this time. Pt has hx/o Cardiac arrest in September.

## 2022-06-22 ENCOUNTER — Observation Stay: Payer: Medicaid Other

## 2022-06-22 ENCOUNTER — Encounter: Payer: Self-pay | Admitting: Internal Medicine

## 2022-06-22 ENCOUNTER — Inpatient Hospital Stay
Admission: EM | Admit: 2022-06-22 | Discharge: 2022-06-26 | DRG: 392 | Disposition: A | Discharge status: Home / Self Care | Payer: Medicaid Other | Attending: Internal Medicine | Admitting: Internal Medicine

## 2022-06-22 ENCOUNTER — Other Ambulatory Visit: Payer: Self-pay

## 2022-06-22 DIAGNOSIS — F1010 Alcohol abuse, uncomplicated: Secondary | ICD-10-CM | POA: Diagnosis present

## 2022-06-22 DIAGNOSIS — R112 Nausea with vomiting, unspecified: Secondary | ICD-10-CM | POA: Diagnosis not present

## 2022-06-22 DIAGNOSIS — Z8711 Personal history of peptic ulcer disease: Secondary | ICD-10-CM

## 2022-06-22 DIAGNOSIS — D696 Thrombocytopenia, unspecified: Secondary | ICD-10-CM | POA: Diagnosis present

## 2022-06-22 DIAGNOSIS — I5022 Chronic systolic (congestive) heart failure: Secondary | ICD-10-CM | POA: Diagnosis present

## 2022-06-22 DIAGNOSIS — J45909 Unspecified asthma, uncomplicated: Secondary | ICD-10-CM | POA: Diagnosis present

## 2022-06-22 DIAGNOSIS — E860 Dehydration: Secondary | ICD-10-CM | POA: Diagnosis present

## 2022-06-22 DIAGNOSIS — Z832 Family history of diseases of the blood and blood-forming organs and certain disorders involving the immune mechanism: Secondary | ICD-10-CM

## 2022-06-22 DIAGNOSIS — F1020 Alcohol dependence, uncomplicated: Secondary | ICD-10-CM | POA: Diagnosis present

## 2022-06-22 DIAGNOSIS — A084 Viral intestinal infection, unspecified: Principal | ICD-10-CM | POA: Diagnosis present

## 2022-06-22 DIAGNOSIS — Z720 Tobacco use: Secondary | ICD-10-CM | POA: Diagnosis present

## 2022-06-22 DIAGNOSIS — Z8616 Personal history of COVID-19: Secondary | ICD-10-CM

## 2022-06-22 DIAGNOSIS — K766 Portal hypertension: Secondary | ICD-10-CM | POA: Diagnosis present

## 2022-06-22 DIAGNOSIS — D509 Iron deficiency anemia, unspecified: Secondary | ICD-10-CM | POA: Diagnosis present

## 2022-06-22 DIAGNOSIS — I420 Dilated cardiomyopathy: Secondary | ICD-10-CM | POA: Diagnosis present

## 2022-06-22 DIAGNOSIS — Z8674 Personal history of sudden cardiac arrest: Secondary | ICD-10-CM

## 2022-06-22 DIAGNOSIS — Z79899 Other long term (current) drug therapy: Secondary | ICD-10-CM

## 2022-06-22 DIAGNOSIS — F419 Anxiety disorder, unspecified: Secondary | ICD-10-CM | POA: Diagnosis present

## 2022-06-22 DIAGNOSIS — F17210 Nicotine dependence, cigarettes, uncomplicated: Secondary | ICD-10-CM | POA: Diagnosis present

## 2022-06-22 DIAGNOSIS — K449 Diaphragmatic hernia without obstruction or gangrene: Secondary | ICD-10-CM | POA: Diagnosis present

## 2022-06-22 DIAGNOSIS — Z8042 Family history of malignant neoplasm of prostate: Secondary | ICD-10-CM

## 2022-06-22 DIAGNOSIS — R1110 Vomiting, unspecified: Secondary | ICD-10-CM | POA: Diagnosis present

## 2022-06-22 DIAGNOSIS — Z800 Family history of malignant neoplasm of digestive organs: Secondary | ICD-10-CM

## 2022-06-22 LAB — URINALYSIS, ROUTINE W REFLEX MICROSCOPIC
Bilirubin Urine: NEGATIVE
Glucose, UA: NEGATIVE mg/dL
Hgb urine dipstick: NEGATIVE
Ketones, ur: 20 mg/dL — AB
Leukocytes,Ua: NEGATIVE
Nitrite: NEGATIVE
Protein, ur: 100 mg/dL — AB
Specific Gravity, Urine: 1.029 (ref 1.005–1.030)
pH: 5 (ref 5.0–8.0)

## 2022-06-22 LAB — RESP PANEL BY RT-PCR (RSV, FLU A&B, COVID)  RVPGX2
Influenza A by PCR: NEGATIVE
Influenza B by PCR: NEGATIVE
Resp Syncytial Virus by PCR: NEGATIVE
SARS Coronavirus 2 by RT PCR: NEGATIVE

## 2022-06-22 LAB — COMPREHENSIVE METABOLIC PANEL
ALT: 16 U/L (ref 0–44)
AST: 28 U/L (ref 15–41)
Albumin: 4.5 g/dL (ref 3.5–5.0)
Alkaline Phosphatase: 49 U/L (ref 38–126)
Anion gap: 10 (ref 5–15)
BUN: 14 mg/dL (ref 6–20)
CO2: 21 mmol/L — ABNORMAL LOW (ref 22–32)
Calcium: 9.4 mg/dL (ref 8.9–10.3)
Chloride: 106 mmol/L (ref 98–111)
Creatinine, Ser: 0.61 mg/dL (ref 0.44–1.00)
GFR, Estimated: >60 mL/min (ref >60)
Glucose, Bld: 100 mg/dL — ABNORMAL HIGH (ref 70–99)
Potassium: 3.9 mmol/L (ref 3.5–5.1)
Sodium: 137 mmol/L (ref 135–145)
Total Bilirubin: 0.7 mg/dL (ref 0.3–1.2)
Total Protein: 8.5 g/dL — ABNORMAL HIGH (ref 6.5–8.1)

## 2022-06-22 LAB — CBC
HCT: 32.3 % — ABNORMAL LOW (ref 36.0–46.0)
Hemoglobin: 9.8 g/dL — ABNORMAL LOW (ref 12.0–15.0)
MCH: 22.2 pg — ABNORMAL LOW (ref 26.0–34.0)
MCHC: 30.3 g/dL (ref 30.0–36.0)
MCV: 73.1 fL — ABNORMAL LOW (ref 80.0–100.0)
Platelets: 256 10*3/uL (ref 150–400)
RBC: 4.42 MIL/uL (ref 3.87–5.11)
RDW: 16.2 % — ABNORMAL HIGH (ref 11.5–15.5)
WBC: 6.2 10*3/uL (ref 4.0–10.5)
nRBC: 0 % (ref 0.0–0.2)

## 2022-06-22 LAB — LIPASE, BLOOD: Lipase: 39 U/L (ref 11–51)

## 2022-06-22 LAB — MAGNESIUM: Magnesium: 2.1 mg/dL (ref 1.7–2.4)

## 2022-06-22 LAB — PHOSPHORUS: Phosphorus: 3.7 mg/dL (ref 2.5–4.6)

## 2022-06-22 MED ORDER — PANTOPRAZOLE SODIUM 40 MG IV SOLR
40.0000 mg | Freq: Two times a day (BID) | INTRAVENOUS | Status: AC
  Administered 2022-06-22 – 2022-06-23 (×2): 40 mg via INTRAVENOUS
  Filled 2022-06-22 (×2): qty 10

## 2022-06-22 MED ORDER — LORAZEPAM 2 MG/ML IJ SOLN
1.0000 mg | INTRAMUSCULAR | Status: DC | PRN
  Administered 2022-06-22: 1 mg via INTRAVENOUS
  Filled 2022-06-22: qty 1

## 2022-06-22 MED ORDER — PANTOPRAZOLE SODIUM 40 MG IV SOLR
40.0000 mg | Freq: Once | INTRAVENOUS | Status: AC
  Administered 2022-06-22: 40 mg via INTRAVENOUS
  Filled 2022-06-22: qty 10

## 2022-06-22 MED ORDER — LACTATED RINGERS IV SOLN: INTRAVENOUS | Status: AC

## 2022-06-22 MED ORDER — ADULT MULTIVITAMIN W/MINERALS CH
1.0000 | ORAL_TABLET | Freq: Every day | ORAL | Status: DC
  Administered 2022-06-22 – 2022-06-26 (×4): 1 via ORAL
  Filled 2022-06-22 (×5): qty 1

## 2022-06-22 MED ORDER — LORAZEPAM 2 MG/ML IJ SOLN
1.0000 mg | Freq: Once | INTRAMUSCULAR | Status: AC
  Administered 2022-06-22: 1 mg via INTRAVENOUS
  Filled 2022-06-22: qty 1

## 2022-06-22 MED ORDER — THIAMINE MONONITRATE 100 MG PO TABS
100.0000 mg | ORAL_TABLET | Freq: Every day | ORAL | Status: DC
  Administered 2022-06-22 – 2022-06-23 (×2): 100 mg via ORAL
  Filled 2022-06-22 (×2): qty 1

## 2022-06-22 MED ORDER — SODIUM CHLORIDE 0.9 % IV BOLUS
1000.0000 mL | Freq: Once | INTRAVENOUS | Status: AC
  Administered 2022-06-22: 1000 mL via INTRAVENOUS

## 2022-06-22 MED ORDER — THIAMINE HCL 100 MG/ML IJ SOLN: 100.0000 mg | Freq: Every day | INTRAMUSCULAR | Status: DC

## 2022-06-22 MED ORDER — ACETAMINOPHEN 325 MG PO TABS: 650.0000 mg | ORAL_TABLET | Freq: Four times a day (QID) | ORAL | Status: DC | PRN

## 2022-06-22 MED ORDER — NICOTINE 21 MG/24HR TD PT24: 21.0000 mg | MEDICATED_PATCH | Freq: Every day | TRANSDERMAL | Status: DC | PRN

## 2022-06-22 MED ORDER — LORAZEPAM 2 MG/ML IJ SOLN: 1.0000 mg | INTRAMUSCULAR | Status: DC | PRN

## 2022-06-22 MED ORDER — FOLIC ACID 1 MG PO TABS
1.0000 mg | ORAL_TABLET | Freq: Every day | ORAL | Status: DC
  Administered 2022-06-22 – 2022-06-26 (×4): 1 mg via ORAL
  Filled 2022-06-22 (×5): qty 1

## 2022-06-22 MED ORDER — ACETAMINOPHEN 650 MG RE SUPP: 650.0000 mg | Freq: Four times a day (QID) | RECTAL | Status: DC | PRN

## 2022-06-22 MED ORDER — LORAZEPAM 1 MG PO TABS: 1.0000 mg | ORAL_TABLET | ORAL | Status: DC | PRN

## 2022-06-22 MED ORDER — ENOXAPARIN SODIUM 40 MG/0.4ML IJ SOSY
40.0000 mg | PREFILLED_SYRINGE | INTRAMUSCULAR | Status: DC
  Administered 2022-06-22 – 2022-06-24 (×3): 40 mg via SUBCUTANEOUS
  Filled 2022-06-22 (×3): qty 0.4

## 2022-06-22 MED ORDER — SENNOSIDES-DOCUSATE SODIUM 8.6-50 MG PO TABS: 1.0000 | ORAL_TABLET | Freq: Every evening | ORAL | Status: DC | PRN

## 2022-06-22 NOTE — H&P (Signed)
History and Physical   Yvette [name] XHB:[id number] DOB: [birth year] DOA: 06/22/2022  PCP: [facility]  Outpatient Specialists: Dr. Fletcher Anon, [facility] cardiology Patient coming from: home  I have personally briefly reviewed patient's old medical records in [place].  Chief Concern: Intractable nausea vomiting  HPI: [name] is a 41 year old female with history of alcohol abuse and dependence., liver cirrhosis, history of alcohol withdrawal, history of cardiac arrest, ventricular fibrillation secondary to prolonged QT after 1 dose of droperidol in September 2023, who presents emergency department for chief concerns of intractable nausea and vomiting.  Initial vitals in the emergency department showed temperature of 98.7, respiration rate of 16, heart rate of 60, blood pressure 136/78, SpO2 100% on room air.  Serum sodium is 137, potassium 3.9, chloride 106, bicarb 20, BUN of 14, serum creatinine of 2.61, EGFR greater than 60, nonfasting glucose 100, WBC 6.2, hemoglobin 9.8, platelets of 256.  Lipase level was within normal limits.  UA was negative for nitrates and leukocytes.  Urine pregnancy test has been ordered and pending collection.  Pregnancy test was negative during ED visit on 06/21/2022.  UDS was negative during ED visit on 06/21/2022.  ED treatment: Ativan 1 mg IV, Protonix 40 mg IV, sodium chloride 1 L bolus. ------------------------- At bedside patient does not appear to be in acute distress. She has flat affect, she was able to tell me her full name, her age, she knows she is in the hospital.  She reports that over the last few days she has had persistent nausea and vomiting too many times to count, along with diarrhea almost too many times to count.  She reports at least 6 episodes of watery diarrhea with flecks of brown stool just today.  He endorses that her husband was recently sick with similar symptoms that he got from his coworker.   She also reports that her neighbor was recently sick with similar symptoms.  She denies any changes to her diet.  She reports that the last time she was able to tolerate p.o. intake was on Sunday, 06/20/22.  She denies cough, fever, swelling of her lower extremities, syncope, chest pain, shortness of breath, dysuria, hematuria.  She endorses abdominal tenderness.  Social history: She was sent home with her husband and child. She occasionally smokes tobacco products. She reports that her last alcoholic drink was about 2 weeks ago.  ROS: Constitutional: no weight change, no fever ENT/Mouth: no sore throat, no rhinorrhea Eyes: no eye pain, no vision changes Cardiovascular: no chest pain, no dyspnea,  no edema, no palpitations Respiratory: no cough, no sputum, no wheezing Gastrointestinal: + nausea, + vomiting,+o diarrhea, no constipation Genitourinary: no urinary incontinence, no dysuria, no hematuria Musculoskeletal: no arthralgias, no myalgias Skin: no skin lesions, no pruritus, Neuro: + weakness, no loss of consciousness, no syncope Psych: no anxiety, no depression, + decrease appetite Heme/Lymph: no bruising, no bleeding  ED Course: Discussed with emergency medicine provider, patient requiring hospitalization due to concerns of intractable nausea vomiting.  Assessment/Plan  Principal Problem:   Intractable nausea and vomiting Active Problems:   Portal hypertension (HCC)   Tobacco abuse   Alcohol abuse   Hypomagnesemia   Hypophosphatemia   Assessment and Plan:  * Intractable nausea and vomiting - Query viral gastroenteritis given recent exposure to individuals with similar symptoms - Etiology workup in progress, differentials include respiratory pathology such as bacterial pneumonia versus acute viral infection - COVID/influenza A/influenza B/RSV PCR has been ordered and pending  collection - Check portable chest x-ray - Symptomatic support and avoiding QT prolongation  agents - Check daily magnesium level - Check phosphorus level on admission - Ativan 1 mg IV every 4 hours.  For anxiety, nausea, vomiting - Admit to telemetry medical, observation  Hypophosphatemia - We will check phosphorus level on admission given patient's history of alcohol abuse and dependence  Hypomagnesemia - History of hypomagnesemia, and ventricular fibrillation with cardiac arrest requiring cardioversion and intubation for acute respiratory failure - Magnesium level on admission is 2.1 - We will check daily magnesium level for 3 days ordered  Alcohol abuse - With alcohol dependence and history of alcohol withdrawal requiring Precedex infusion - Patient reports that she has not had an alcoholic beverage in over 1 week - We will initiate CIWA precaution given patient's history of withdrawal with thiamine, folate, and multivitamin supplementation  Tobacco abuse - As needed nicotine patch for nicotine craving ordered  Portal hypertension ([place]) - Secondary to history of alcohol abuse and dependence - Continue alcohol cessation and to follow-up with gastroenterologist/hepatologist as appropriate  Chart reviewed.   Hospitalization 03/30/2022 to 04/06/2022: Patient was admitted for hypovolemic shock, delirium tremens, GI along with cardiology progress with rehabilitation team presumed secondary to droperidol and hypomagnesemia, hypokalemia, hypophosphatemia.  Patient had cardiac arrest, was cardioverted, required intubation secondary to acute hypoxic respiratory failure and cardiac arrest.  Patient was admitted to ICU level service and was ultimately extubated on 03/31/2022.  On 04/02/2022, patient had severe agitation requiring Precedex for alcohol withdrawal and required pressure support for low blood pressure was transferred back to ICU level service.  Patient was transferred back to medical service on 04/04/2022.  DVT prophylaxis: Enoxaparin 40 mg subcutaneous Code Status: Full  code Diet: Clear liquids as tolerated Family Communication: Patient request that I update her father, Mr. Brinley Rosete and then her father will update her husband as he is working. I updated Mr. 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 in the setting of n/v->gastritis/etoh.   Leukopenia    Neutropenia (HCC)    NICM (nonischemic cardiomyopathy) ([place])    a. 06/2021 Echo: EF 50-55%, [name]; b. 09/2021 ETT: Ex time 6:19, [name], [name]; c. 03/2022 Echo: EF 35-40%, [name].   NSVT (nonsustained ventricular tachycardia) ([place])    a. Noted during admission 06/2021 assoc w/ palpitations and presyncope in the setting of profound QT prolongation/gastritis.   Tobacco abuse    Transaminitis    Past Surgical History:  Procedure Laterality Date   ESOPHAGOGASTRODUODENOSCOPY N/A 10/29/2020   Procedure: ESOPHAGOGASTRODUODENOSCOPY (EGD);  Surgeon: Jonathon Bellows, MD;  [facility] ENDOSCOPY;  Service: Gastroenterology;  [name];   ESOPHAGOGASTRODUODENOSCOPY (EGD) WITH PROPOFOL N/A 03/14/2020   Procedure: ESOPHAGOGASTRODUODENOSCOPY (EGD) WITH PROPOFOL;  Surgeon: Virgel Manifold, MD;  [name];  Service: Endoscopy;  [name];   ESOPHAGOGASTRODUODENOSCOPY (EGD) WITH PROPOFOL N/A 02/25/2021   Procedure: ESOPHAGOGASTRODUODENOSCOPY (EGD) WITH PROPOFOL;  Surgeon: Virgel Manifold, MD;  [name];  Service: Endoscopy;  Laterality:  N/A;   Social History:  reports that she has been smoking cigarettes. She has a 2.50 pack-year smoking history. She has  never used smokeless tobacco. She reports current alcohol use of about 61.0 standard drinks of alcohol per week. She reports that she does not use drugs.  No Known Allergies Family History  Problem Relation Age of Onset   Lupus Mother    Prostate cancer Father    Cancer Paternal Aunt        Breast    Cancer Paternal Aunt        Breast    Family history: Family history reviewed and not pertinent.  Prior to Admission medications   Medication Sig Start Date End Date Taking? Authorizing Provider  DEPO-PROVERA 150 MG/ML injection Inject 150 mg into the muscle every 3 (three) months. 03/22/22  Yes [provider]  naltrexone (DEPADE) 50 MG tablet Take 50 mg by mouth daily. 04/13/22  Yes [provider]  pantoprazole (PROTONIX) 40 MG tablet Take 40 mg by mouth daily. 08/12/21  Yes [provider]  Desvenlafaxine ER (PRISTIQ) 50 MG TB24 Take 1 tablet by mouth daily. Patient not taking: Reported on 06/22/2022 05/16/22   [provider]  MACROBID 100 MG capsule Take 100 mg by mouth 2 (two) times daily. Patient not taking: Reported on 06/22/2022 05/25/22   [provider]   Physical Exam: Vitals:   06/22/22 1206 06/22/22 1420 06/22/22 1534 06/22/22 1801  BP: 136/78 133/75 134/86   Pulse: 60 (!) 58 62 63  Resp: _0 Temp: 98.7 F (37.1 C)     TempSrc: Oral     SpO2: 100% 100% 100% 100%  Weight: 52.6 kg     Height: _1  (1.549 m)      Constitutional: appears age appropriate, NAD, calm, comfortable Eyes: PERRL, lids and conjunctivae normal ENMT: Mucous membranes are moist. Posterior pharynx clear of any exudate or lesions. Age-appropriate dentition. Hearing appropriate Neck: normal, supple, no masses, no thyromegaly Respiratory: clear to auscultation bilaterally, no wheezing, no crackles. Normal respiratory effort. No accessory muscle use.  Cardiovascular: Regular rate and rhythm, no murmurs / rubs / gallops. No extremity edema. 2+ pedal  pulses. No carotid bruits.  Abdomen: + tenderness, no masses palpated, no hepatosplenomegaly. Bowel sounds positive.  Musculoskeletal: no clubbing / cyanosis. No joint deformity upper and lower extremities. Good ROM, no contractures, no atrophy. Normal muscle tone.  Skin: no rashes, lesions, ulcers. No induration Neurologic: Sensation intact. Strength 5/5 in all 4.  Psychiatric: Normal judgment and insight. Alert and oriented x 3. Flat affect  EKG: independently reviewed, showing sinus rhythm with rate of 60, QTc 432  Chest x-ray on Admission: I personally reviewed and I agree with radiologist reading as below.  DG Chest Port 1 View  Result Date: 06/22/2022 CLINICAL DATA:  Nausea and vomiting for 2-3 days EXAM: PORTABLE CHEST 1 VIEW COMPARISON:  03/30/2022 FINDINGS: Cardiac and mediastinal margins appear normal. The patient is rotated to the left on today's radiograph, reducing diagnostic sensitivity and specificity. The lungs appear clear, with subtle increased density in the right hemithorax compared to the left likely related to leftward rotation and distribution of soft tissues of the chest wall/breasts. No blunting of the costophrenic angles. No significant bony abnormality observed. IMPRESSION: 1. No active cardiopulmonary disease is radiographically apparent. Electronically Signed   By: Van Clines M.D.   On: 06/22/2022 17:32    Labs on Admission: I have personally reviewed following labs  CBC: Recent Labs  Lab 06/21/22 1527 06/22/22 1220  WBC 7.2 6.2  HGB 11.3* 9.8*  HCT 39.1 32.3*  MCV 76.7* 73.1*  PLT 288 824   Basic Metabolic Panel: Recent Labs  Lab 06/21/22 1527 06/22/22 1210 06/22/22 1220  NA 139  --  137  K 3.5  --  3.9  CL 105  --  106  CO2 20*  --  21*  GLUCOSE 103*  --  100*  BUN 17  --  14  CREATININE 0.80  --  0.61  CALCIUM 10.1  --  9.4  MG  --   --  2.1  PHOS  --  3.7  --    GFR: Estimated Creatinine Clearance: 71.2 mL/min (by C-G formula  based on SCr of 0.61 mg/dL).  Liver Function Tests: Recent Labs  Lab 06/21/22 1527 06/22/22 1220  AST 32 28  ALT 14 16  ALKPHOS 57 49  BILITOT 0.4 0.7  PROT 9.9* 8.5*  ALBUMIN 5.2* 4.5   Recent Labs  Lab 06/21/22 1527 06/22/22 1220  LIPASE 25 39   Urine analysis:    Component Value Date/Time   COLORURINE YELLOW (A) 06/22/2022 1210   APPEARANCEUR HAZY (A) 06/22/2022 1210   LABSPEC 1.029 06/22/2022 1210   PHURINE 5.0 06/22/2022 1210   GLUCOSEU NEGATIVE 06/22/2022 1210   HGBUR NEGATIVE 06/22/2022 1210   BILIRUBINUR NEGATIVE 06/22/2022 1210   KETONESUR 20 (A) 06/22/2022 1210   PROTEINUR 100 (A) 06/22/2022 1210   NITRITE NEGATIVE 06/22/2022 1210   LEUKOCYTESUR NEGATIVE 06/22/2022 1210   This document was prepared using Dragon Voice Recognition software and may include unintentional dictation errors.  Dr. Tobie Poet [facility]  If 7PM-7AM, please contact overnight-coverage provider If 7AM-7PM, please contact day coverage provider [email or web address]  06/22/2022, 6:14 PM

## 2022-06-22 NOTE — ED Notes (Signed)
This RN assessed both of pt's arms for IV access. Pt calmly laying in bed. No vomiting or diarrhea noted since pt arrived to [facility].

## 2022-06-22 NOTE — Assessment & Plan Note (Signed)
-   History of hypomagnesemia, and ventricular fibrillation with cardiac arrest requiring cardioversion and intubation for acute respiratory failure - Magnesium level on admission is 2.1 - We will check daily magnesium level for 3 days ordered

## 2022-06-22 NOTE — Assessment & Plan Note (Signed)
-   Secondary to history of alcohol abuse and dependence - Continue alcohol cessation and to follow-up with gastroenterologist/hepatologist as appropriate

## 2022-06-22 NOTE — ED Notes (Signed)
Called lab to add on phosphorus; Staff stated they will soon. Mag d/c as is duplicate.

## 2022-06-22 NOTE — Assessment & Plan Note (Addendum)
-   Query viral gastroenteritis given recent exposure to individuals with similar symptoms - Etiology workup in progress, differentials include respiratory pathology such as bacterial pneumonia versus acute viral infection - COVID/influenza A/influenza B/RSV PCR has been ordered and pending collection - Check portable chest x-ray - Symptomatic support and avoiding QT prolongation agents - Check daily magnesium level - Check phosphorus level on admission - Ativan 1 mg IV every 4 hours.  For anxiety, nausea, vomiting - Admit to telemetry medical, observation

## 2022-06-22 NOTE — Hospital Course (Addendum)
[name] is a 41 year old female with history of alcohol abuse and dependence., liver cirrhosis, history of alcohol withdrawal, history of cardiac arrest, ventricular fibrillation secondary to prolonged QT after 1 dose of droperidol in September 2023, who presents emergency department for chief concerns of intractable nausea and vomiting.  Initial vitals in the emergency department showed temperature of 98.7, respiration rate of 16, heart rate of 60, blood pressure 136/78, SpO2 100% on room air.  Serum sodium is 137, potassium 3.9, chloride 106, bicarb 20, BUN of 14, serum creatinine of 2.61, EGFR greater than 60, nonfasting glucose 100, WBC 6.2, hemoglobin 9.8, platelets of 256.  Lipase level was within normal limits.  UA was negative for nitrates and leukocytes.  Urine pregnancy test has been ordered and pending collection.  Pregnancy test was negative during ED visit on 06/21/2022.  UDS was negative during ED visit on 06/21/2022.  ED treatment: Ativan 1 mg IV, Protonix 40 mg IV, sodium chloride 1 L bolus.

## 2022-06-22 NOTE — ED Provider Triage Note (Signed)
  Emergency Medicine Provider Triage Evaluation Note  Yvette [name] , a 41 y.o.female,  was evaluated in triage.  Pt complains of nausea/vomiting.  She states that she was here last night for the same.  She received IV fluids and metoclopramide.  She was discharged after symptoms improved.  However, she states that her symptoms are back.  Reports some abdominal discomfort, no significant pain.   Review of Systems  Positive: Nausea/vomiting. Negative: Denies fever, chest pain, back pain  Physical Exam  There were no vitals filed for this visit. Gen:   Awake, no distress   Resp:  Normal effort  MSK:   Moves extremities without difficulty  Other:    Medical Decision Making  Given the patient's initial medical screening exam, the following diagnostic evaluation has been ordered. The patient will be placed in the appropriate treatment space, once one is available, to complete the evaluation and treatment. I have discussed the plan of care with the patient and I have advised the patient that an ED physician or mid-level practitioner will reevaluate their condition after the test results have been received, as the results may give them additional insight into the type of treatment they may need.    Diagnostics: Labs, EKG, urinalysis  Treatments: none immediately   Varney Daily, [place] 06/22/22 1144

## 2022-06-22 NOTE — ED Notes (Signed)
Pt resting calmly on stretcher; in NAD.

## 2022-06-22 NOTE — ED Triage Notes (Signed)
Pt comes via EMs with c/o vomiting since yesterday. Pt was seen here in ED for same and was discharged.

## 2022-06-22 NOTE — Assessment & Plan Note (Signed)
-   We will check phosphorus level on admission given patient's history of alcohol abuse and dependence

## 2022-06-22 NOTE — ED Notes (Signed)
Pt given her dinner tray.

## 2022-06-22 NOTE — Assessment & Plan Note (Addendum)
-   As needed nicotine patch for nicotine craving ordered 

## 2022-06-22 NOTE — ED Provider Notes (Signed)
[facility] Provider Note    Event Date/Time   First MD Initiated Contact with Patient 06/22/22 1501     (approximate)   History   Emesis   HPI  Yvette [name] is a 41 y.o. female past medical history significant for malnutrition, alcohol use, recurrent hyperemesis syndrome, who presents to the emergency department with nausea and vomiting.  Patient states that she has been unable to tolerate p.o. since Sunday.  Last drink of alcohol was 1 month ago.  Patient was evaluated yesterday in the emergency department.  States that since she got home she continued to have multiple episodes of nausea and vomiting and abdominal pain.  Feeling dehydrated and like she might pass out.     Physical Exam   Triage Vital Signs: ED Triage Vitals  Enc Vitals Group     BP 06/22/22 1206 136/78     Pulse Rate 06/22/22 1206 60     Resp 06/22/22 1206 16     Temp 06/22/22 1206 98.7 F (37.1 C)     Temp Source 06/22/22 1206 Oral     SpO2 06/22/22 1206 100 %     Weight 06/22/22 1206 116 lb (52.6 kg)     Height 06/22/22 1206 5\' 1"  (1.549 m)     Head Circumference --      Peak Flow --      Pain Score 06/22/22 1136 0     Pain Loc --      Pain Edu? --      Excl. in GC? --     Most recent vital signs: Vitals:   06/22/22 2116 06/22/22 2343  BP: (!) 143/77 124/85  Pulse: (!) 57 78  Resp: 17 17  Temp: 98.8 F (37.1 C) 98.6 F (37 C)  SpO2: 100% 100%    Physical Exam Constitutional:      Appearance: She is well-developed.  HENT:     Head: Atraumatic.     Comments: Dry mucous membranes Eyes:     Conjunctiva/sclera: Conjunctivae normal.  Cardiovascular:     Rate and Rhythm: Regular rhythm.  Pulmonary:     Effort: No respiratory distress.  Abdominal:     General: There is no distension.     Tenderness: There is no abdominal tenderness.  Musculoskeletal:        General: Normal range of motion.     Cervical back: Normal range of motion.  Skin:    General: Skin  is warm.     Capillary Refill: Capillary refill takes 2 to 3 seconds.  Neurological:     Mental Status: She is alert. Mental status is at baseline.     IMPRESSION / MDM / ASSESSMENT AND PLAN / ED COURSE  I reviewed the triage vital signs and the nursing notes.  Differential diagnosis including pancreatitis, dehydration, hyperemesis syndrome, dehydration, gastritis/PUD  EKG  I, 14/12/23, the attending physician, personally viewed and interpreted this ECG.   Rate: Normal  Rhythm: Normal sinus  Axis: Normal  Intervals: Normal  ST&T Change: None Significant artifact  No tachycardic or bradycardic dysrhythmias while on cardiac telemetry.   Labs (all labs ordered are listed, but only abnormal results are displayed) Labs interpreted as -   Positive ketones, mildly low CO2, no significant electrolyte abnormalities, downtrending hemoglobin Labs Reviewed  COMPREHENSIVE METABOLIC PANEL - Abnormal; Notable for the following components:      Result Value   CO2 21 (*)    Glucose, Bld 100 (*)  Total Protein 8.5 (*)    All other components within normal limits  CBC - Abnormal; Notable for the following components:   Hemoglobin 9.8 (*)    HCT 32.3 (*)    MCV 73.1 (*)    MCH 22.2 (*)    RDW 16.2 (*)    All other components within normal limits  URINALYSIS, ROUTINE W REFLEX MICROSCOPIC - Abnormal; Notable for the following components:   Color, Urine YELLOW (*)    APPearance HAZY (*)    Ketones, ur 20 (*)    Protein, ur 100 (*)    Bacteria, UA MANY (*)    All other components within normal limits  RESP PANEL BY RT-PCR (RSV, FLU A&B, COVID)  RVPGX2  LIPASE, BLOOD  MAGNESIUM  PHOSPHORUS  BASIC METABOLIC PANEL  CBC  MAGNESIUM  POC URINE PREG, ED      On chart review patient had a history of cardiac arrest secondary to prolonged QTc with recommendation and not receiving prolonged QT medications.  Due to this patient received IV benzodiazepines for nausea and vomiting.   Given IV fluids.  Given IV Pepcid.  No signs or symptoms of a GI bleed.  No signs of pancreatitis.  Patient admitted for intractable nausea and vomiting and dehydration.   PROCEDURES:  Critical Care performed: No  Procedures  Patient's presentation is most consistent with acute presentation with potential threat to life or bodily function.   MEDICATIONS ORDERED IN ED: Medications  acetaminophen (TYLENOL) tablet 650 mg (has no administration in time range)    Or  acetaminophen (TYLENOL) suppository 650 mg (has no administration in time range)  enoxaparin (LOVENOX) injection 40 mg (40 mg Subcutaneous Given 06/22/22 2208)  senna-docusate (Senokot-S) tablet 1 tablet (has no administration in time range)  LORazepam (ATIVAN) injection 1 mg (1 mg Intravenous Given 06/22/22 2210)  lactated ringers infusion ( Intravenous New Bag/Given 06/22/22 2216)  pantoprazole (PROTONIX) injection 40 mg (40 mg Intravenous Given 06/22/22 2209)  nicotine (NICODERM CQ - dosed in mg/24 hours) patch 21 mg (has no administration in time range)  LORazepam (ATIVAN) tablet 1-4 mg (has no administration in time range)    Or  LORazepam (ATIVAN) injection 1-4 mg (has no administration in time range)  thiamine (VITAMIN B1) tablet 100 mg (100 mg Oral Given 06/22/22 1754)    Or  thiamine (VITAMIN B1) injection 100 mg ( Intravenous See Alternative 06/22/22 1754)  folic acid (FOLVITE) tablet 1 mg (1 mg Oral Given 06/22/22 1754)  multivitamin with minerals tablet 1 tablet (1 tablet Oral Given 06/22/22 1754)  sodium chloride 0.9 % bolus 1,000 mL (0 mLs Intravenous Stopped 06/22/22 1717)  LORazepam (ATIVAN) injection 1 mg (1 mg Intravenous Given 06/22/22 1633)  pantoprazole (PROTONIX) injection 40 mg (40 mg Intravenous Given 06/22/22 1635)    FINAL CLINICAL IMPRESSION(S) / ED DIAGNOSES   Final diagnoses:  Dehydration  Nausea and vomiting, unspecified vomiting type     Rx / DC Orders   ED Discharge Orders      None        Note:  This document was prepared using Dragon voice recognition software and may include unintentional dictation errors.   Corena Herter, MD 06/23/22 0000

## 2022-06-22 NOTE — Assessment & Plan Note (Addendum)
-   With alcohol dependence and history of alcohol withdrawal requiring Precedex infusion - Patient reports that she has not had an alcoholic beverage in over 1 week - We will initiate CIWA precaution given patient's history of withdrawal with thiamine, folate, and multivitamin supplementation

## 2022-06-22 NOTE — ED Notes (Signed)
Pt c/o N/V/D x2-3 days; states intermittent lower right and left abdominal pain and tenderness; denies changes to urination such as burning or frequency; states decreased urine output; pt fatigued. Pt's resp reg/unlabored, skin dry and laying calmly on stretcher.

## 2022-06-23 DIAGNOSIS — D696 Thrombocytopenia, unspecified: Secondary | ICD-10-CM | POA: Diagnosis present

## 2022-06-23 DIAGNOSIS — Z832 Family history of diseases of the blood and blood-forming organs and certain disorders involving the immune mechanism: Secondary | ICD-10-CM | POA: Diagnosis not present

## 2022-06-23 DIAGNOSIS — A084 Viral intestinal infection, unspecified: Secondary | ICD-10-CM | POA: Diagnosis present

## 2022-06-23 DIAGNOSIS — K766 Portal hypertension: Secondary | ICD-10-CM

## 2022-06-23 DIAGNOSIS — Z800 Family history of malignant neoplasm of digestive organs: Secondary | ICD-10-CM | POA: Diagnosis not present

## 2022-06-23 DIAGNOSIS — F1010 Alcohol abuse, uncomplicated: Secondary | ICD-10-CM

## 2022-06-23 DIAGNOSIS — Z79899 Other long term (current) drug therapy: Secondary | ICD-10-CM | POA: Diagnosis not present

## 2022-06-23 DIAGNOSIS — E860 Dehydration: Secondary | ICD-10-CM | POA: Diagnosis present

## 2022-06-23 DIAGNOSIS — Z8711 Personal history of peptic ulcer disease: Secondary | ICD-10-CM | POA: Diagnosis not present

## 2022-06-23 DIAGNOSIS — F17210 Nicotine dependence, cigarettes, uncomplicated: Secondary | ICD-10-CM | POA: Diagnosis present

## 2022-06-23 DIAGNOSIS — R112 Nausea with vomiting, unspecified: Secondary | ICD-10-CM | POA: Diagnosis present

## 2022-06-23 DIAGNOSIS — Z8042 Family history of malignant neoplasm of prostate: Secondary | ICD-10-CM | POA: Diagnosis not present

## 2022-06-23 DIAGNOSIS — I420 Dilated cardiomyopathy: Secondary | ICD-10-CM | POA: Diagnosis present

## 2022-06-23 DIAGNOSIS — D509 Iron deficiency anemia, unspecified: Secondary | ICD-10-CM | POA: Diagnosis present

## 2022-06-23 DIAGNOSIS — Z8616 Personal history of COVID-19: Secondary | ICD-10-CM | POA: Diagnosis not present

## 2022-06-23 DIAGNOSIS — I5022 Chronic systolic (congestive) heart failure: Secondary | ICD-10-CM | POA: Diagnosis present

## 2022-06-23 DIAGNOSIS — K449 Diaphragmatic hernia without obstruction or gangrene: Secondary | ICD-10-CM | POA: Diagnosis present

## 2022-06-23 DIAGNOSIS — F419 Anxiety disorder, unspecified: Secondary | ICD-10-CM | POA: Diagnosis present

## 2022-06-23 DIAGNOSIS — J45909 Unspecified asthma, uncomplicated: Secondary | ICD-10-CM | POA: Diagnosis present

## 2022-06-23 DIAGNOSIS — Z8674 Personal history of sudden cardiac arrest: Secondary | ICD-10-CM | POA: Diagnosis not present

## 2022-06-23 LAB — CBC
HCT: 28.2 % — ABNORMAL LOW (ref 36.0–46.0)
Hemoglobin: 8.7 g/dL — ABNORMAL LOW (ref 12.0–15.0)
MCH: 22.3 pg — ABNORMAL LOW (ref 26.0–34.0)
MCHC: 30.9 g/dL (ref 30.0–36.0)
MCV: 72.3 fL — ABNORMAL LOW (ref 80.0–100.0)
Platelets: 231 10*3/uL (ref 150–400)
RBC: 3.9 MIL/uL (ref 3.87–5.11)
RDW: 16.4 % — ABNORMAL HIGH (ref 11.5–15.5)
WBC: 4.3 10*3/uL (ref 4.0–10.5)
nRBC: 0 % (ref 0.0–0.2)

## 2022-06-23 LAB — MAGNESIUM: Magnesium: 1.9 mg/dL (ref 1.7–2.4)

## 2022-06-23 LAB — BASIC METABOLIC PANEL
Anion gap: 8 (ref 5–15)
BUN: 10 mg/dL (ref 6–20)
CO2: 23 mmol/L (ref 22–32)
Calcium: 8.9 mg/dL (ref 8.9–10.3)
Chloride: 108 mmol/L (ref 98–111)
Creatinine, Ser: 0.7 mg/dL (ref 0.44–1.00)
GFR, Estimated: >60 mL/min (ref >60)
Glucose, Bld: 93 mg/dL (ref 70–99)
Potassium: 3 mmol/L — ABNORMAL LOW (ref 3.5–5.1)
Sodium: 139 mmol/L (ref 135–145)

## 2022-06-23 MED ORDER — POTASSIUM CHLORIDE CRYS ER 20 MEQ PO TBCR: 40.0000 meq | EXTENDED_RELEASE_TABLET | Freq: Once | ORAL | Status: DC

## 2022-06-23 MED ORDER — ORAL CARE MOUTH RINSE: 15.0000 mL | OROMUCOSAL | Status: DC | PRN

## 2022-06-23 MED ORDER — METOCLOPRAMIDE HCL 5 MG/ML IJ SOLN: 5.0000 mg | Freq: Once | INTRAMUSCULAR | Status: DC

## 2022-06-23 MED ORDER — METOCLOPRAMIDE HCL 5 MG/ML IJ SOLN
5.0000 mg | Freq: Four times a day (QID) | INTRAMUSCULAR | Status: DC | PRN
  Administered 2022-06-23 – 2022-06-25 (×6): 5 mg via INTRAVENOUS
  Filled 2022-06-23 (×6): qty 2

## 2022-06-23 MED ORDER — MAGNESIUM SULFATE IN D5W 1-5 GM/100ML-% IV SOLN
1.0000 g | Freq: Once | INTRAVENOUS | Status: AC
  Administered 2022-06-23: 1 g via INTRAVENOUS
  Filled 2022-06-23: qty 100

## 2022-06-23 MED ORDER — SODIUM CHLORIDE 0.9 % IV SOLN
12.5000 mg | Freq: Three times a day (TID) | INTRAVENOUS | Status: DC | PRN
  Filled 2022-06-23: qty 0.5

## 2022-06-23 MED ORDER — PANTOPRAZOLE SODIUM 40 MG IV SOLR
40.0000 mg | Freq: Two times a day (BID) | INTRAVENOUS | Status: DC
  Administered 2022-06-23 – 2022-06-24 (×2): 40 mg via INTRAVENOUS
  Filled 2022-06-23 (×2): qty 10

## 2022-06-23 MED ORDER — POTASSIUM CHLORIDE 10 MEQ/100ML IV SOLN
10.0000 meq | INTRAVENOUS | Status: AC
  Administered 2022-06-23 (×3): 10 meq via INTRAVENOUS
  Filled 2022-06-23 (×3): qty 100

## 2022-06-23 NOTE — Plan of Care (Signed)

## 2022-06-23 NOTE — Progress Notes (Addendum)
[facility]  - [place] at [facility]   PATIENT NAME: Yvette [name]    MR#:  [id number]  DATE OF BIRTH:  1981/02/12  SUBJECTIVE:  patient was feeling better earlier in the day. She took some PO meds and started having vomiting and dry heaving. No family at bedside. Asking for some Reglan.    VITALS:  Blood pressure (!) 144/88, pulse 64, temperature 99.4 F (37.4 C), resp. rate 18, height 5\' 1"  (1.549 m), weight 52.6 kg, SpO2 100 %.  PHYSICAL EXAMINATION:   GENERAL:  41 y.o.-year-old patient lying in the bed with no acute distress.  LUNGS: Normal breath sounds bilaterally, no wheezing CARDIOVASCULAR: S1, S2 normal. No murmur   ABDOMEN: Soft, nontender, nondistended. Bowel sounds present.  EXTREMITIES: No  edema b/l.    NEUROLOGIC: nonfocal  patient is alert and awake SKIN: No obvious rash, lesion, or ulcer.   LABORATORY PANEL:  CBC Recent Labs  Lab 06/23/22 0226  WBC 4.3  HGB 8.7*  HCT 28.2*  PLT 231    Chemistries  Recent Labs  Lab 06/22/22 1220 06/23/22 0226  NA 137 139  K 3.9 3.0*  CL 106 108  CO2 21* 23  GLUCOSE 100* 93  BUN 14 10  CREATININE 0.61 0.70  CALCIUM 9.4 8.9  MG 2.1 1.9  AST 28  --   ALT 16  --   ALKPHOS 49  --   BILITOT 0.7  --    Cardiac Enzymes No results for input(s): "TROPONINI" in the last 168 hours. RADIOLOGY:  DG Chest Port 1 View  Result Date: 06/22/2022 CLINICAL DATA:  Nausea and vomiting for 2-3 days EXAM: PORTABLE CHEST 1 VIEW COMPARISON:  03/30/2022 FINDINGS: Cardiac and mediastinal margins appear normal. The patient is rotated to the left on today's radiograph, reducing diagnostic sensitivity and specificity. The lungs appear clear, with subtle increased density in the right hemithorax compared to the left likely related to leftward rotation and distribution of soft tissues of the chest wall/breasts. No blunting of the costophrenic angles. No significant bony abnormality observed. IMPRESSION: 1. No active  cardiopulmonary disease is radiographically apparent. Electronically Signed   By: 04/01/2022 M.D.   On: 06/22/2022 17:32    Assessment and Plan  Yvette [name] is a 41 year old female with history of alcohol abuse and dependence., liver cirrhosis, history of alcohol withdrawal, history of cardiac arrest, ventricular fibrillation secondary to prolonged QT after 1 dose of droperidol in September 2023, who presents emergency department for chief concerns of intractable nausea and vomiting.    Intractable nausea and vomiting history of Gerd/PUD - Query viral gastroenteritis given recent exposure to individuals with similar symptoms -- influenza, COVID, RSV negative - Symptomatic support and avoiding QT prolongation agents - continue IV fluids -- PRN Reglan and Phenergan -- continue IV Protonix  Alcohol abuse - With alcohol dependence and history of alcohol withdrawal  - Patient reports that she has not had an alcoholic beverage in over 1 week - We will initiate CIWA precaution given patient's history of withdrawal with thiamine, folate, and multivitamin supplementation   Tobacco abuse - As needed nicotine patch for nicotine craving ordered   Portal hypertension (HCC) - Secondary to history of alcohol abuse and dependence - Continue alcohol cessation and to follow-up with gastroenterologist/hepatologist as appropriate  H/o V FIB --pt remains in NSR. Per Rn does not want to wear Tele monitor  Procedures: Family communication :none Consults :none CODE STATUS: full DVT Prophylaxis :scd Level of care: Telemetry  Medical Status is: Inpatient Remains inpatient appropriate because: N/V    TOTAL TIME TAKING CARE OF THIS PATIENT: 35 minutes.  >50% time spent on counselling and coordination of care  Note: This dictation was prepared with Dragon dictation along with smaller phrase technology. Any transcriptional errors that result from this process are unintentional.  Enedina Finner M.D     [facility]   CC: Primary care physician; Center, [facility]

## 2022-06-23 NOTE — Progress Notes (Signed)
Pt refuses to keep cardiac monitoring on.   MD notified

## 2022-06-24 MED ORDER — SODIUM CHLORIDE 0.9 % IV SOLN: 12.5000 mg | Freq: Two times a day (BID) | INTRAVENOUS | Status: DC | PRN

## 2022-06-24 MED ORDER — PANTOPRAZOLE SODIUM 40 MG PO TBEC
40.0000 mg | DELAYED_RELEASE_TABLET | Freq: Every day | ORAL | Status: DC
  Filled 2022-06-24: qty 1

## 2022-06-24 NOTE — Plan of Care (Signed)

## 2022-06-24 NOTE — Discharge Instructions (Addendum)
Intensive Outpatient Programs   [facility] [address] [place],  [place], [place] [phone]  [facility] (Inpatient and outpatient)[phone] (Suboxone and Methadone) [address] [phone]  [facility]: [facility] Programs - Intensive Outpatient [address] [place], [place] [address], [place]  [phone]  [facility] (Outpatient, Inpatient, [facility] (Groups and Residental) (insurance only) [phone] [place], [place] [phone]   [facility] (for caregivers and family) [address] Laurell Josephs [address], [place], [place] [phone]  Residential Treatment Programs  [facility](2 years) Residential: 28 days)[facility] [address] [place], [place], [place] [phone] or [phone]  [facility] [address] [place], [place], [place] [phone]  [facility] ([facility] [address] [place], [place], [place] [phone] Admissions: 8am-3pm M-F  [facility]: Residential Program [phone] Days)             [facility]: [facility]  [place], [place], [place]  [phone] or [phone] in Hours over the weekend or by referral)   [facility] Crisis: [facility]:[phone] (for crisis response 24 hours a day)    Stay on full liquid diet for 2-3 days and then advance as tolerated

## 2022-06-24 NOTE — TOC Progression Note (Signed)
Transition of Care [facility]) - Progression Note    Patient Details  Name: Yvette [name] MRN: [id number] Date of Birth: 1981/02/08  Transition of Care [facility]) CM/SW Contact  Marlowe Sax, RN Phone Number: 06/24/2022, 10:10 AM  Clinical Narrative:    Substance abuse resources added to AVS for DC        Expected Discharge Plan and Services                                                 Social Determinants of Health (SDOH) Interventions    Readmission Risk Interventions     No data to display

## 2022-06-24 NOTE — Progress Notes (Signed)
[facility]  - Yvette [name] at [facility]   PATIENT NAME: Yvette [name]    MR#:  [id number]  DATE OF BIRTH:  [birth year]  SUBJECTIVE:  No vomiting today. Tolerated FLD today Wants to stay another nite to make sure she does not have vomiting    VITALS:  Blood pressure 126/79, pulse 60, temperature 99.2 F (37.3 C), resp. rate 17, height 5\' 1"  (1.549 m), weight 52.6 kg, SpO2 100 %.  PHYSICAL EXAMINATION:   GENERAL:  41 y.o.-year-old patient lying in the bed with no acute distress.  LUNGS: Normal breath sounds bilaterally, no wheezing CARDIOVASCULAR: S1, S2 normal. No murmur   ABDOMEN: Soft, nontender, nondistended. Bowel sounds present.  EXTREMITIES: No  edema b/l.    NEUROLOGIC: nonfocal  patient is alert and awake SKIN: No obvious rash, lesion, or ulcer.   LABORATORY PANEL:  CBC Recent Labs  Lab 06/23/22 0226  WBC 4.3  HGB 8.7*  HCT 28.2*  PLT 231     Chemistries  Recent Labs  Lab 06/22/22 1220 06/23/22 0226  NA 137 139  K 3.9 3.0*  CL 106 108  CO2 21* 23  GLUCOSE 100* 93  BUN 14 10  CREATININE 0.61 0.70  CALCIUM 9.4 8.9  MG 2.1 1.9  AST 28  --   ALT 16  --   ALKPHOS 49  --   BILITOT 0.7  --     Cardiac Enzymes No results for input(s): "TROPONINI" in the last 168 hours. RADIOLOGY:  DG Chest Port 1 View  Result Date: 06/22/2022 CLINICAL DATA:  Nausea and vomiting for 2-3 days EXAM: PORTABLE CHEST 1 VIEW COMPARISON:  03/30/2022 FINDINGS: Cardiac and mediastinal margins appear normal. The patient is rotated to the left on today's radiograph, reducing diagnostic sensitivity and specificity. The lungs appear clear, with subtle increased density in the right hemithorax compared to the left likely related to leftward rotation and distribution of soft tissues of the chest wall/breasts. No blunting of the costophrenic angles. No significant bony abnormality observed. IMPRESSION: 1. No active cardiopulmonary disease is radiographically apparent.  Electronically Signed   By: 04/01/2022 M.D.   On: 06/22/2022 17:32    Assessment and Plan  Yvette [name] is a 41 year old female with history of alcohol abuse and dependence., liver cirrhosis, history of alcohol withdrawal, history of cardiac arrest, ventricular fibrillation secondary to prolonged QT after 1 dose of droperidol in September 2023, who presents emergency department for chief concerns of intractable nausea and vomiting.   Intractable nausea and vomiting history of Gerd/PUD - Query viral gastroenteritis given recent exposure to individuals with similar symptoms -- influenza, COVID, RSV negative - Symptomatic support and avoiding QT prolongation agents - continue IV fluids -- PRN Reglan and Phenergan -- continue po Protonix  Alcohol abuse - With alcohol dependence and history of alcohol withdrawal  - Patient reports that she has not had an alcoholic beverage in over 1 week - We will initiate CIWA precaution given patient's history of withdrawal with thiamine, folate, and multivitamin supplementation --no s/o WD   Tobacco abuse - As needed nicotine patch for nicotine craving ordered   Portal hypertension (HCC) - Secondary to history of alcohol abuse and dependence - Continue alcohol cessation and to follow-up with gastroenterologist/hepatologist as appropriate  H/o V FIB --pt remains in NSR. Per Rn does not want to wear Tele monitor  Procedures: Family communication :none Consults :none CODE STATUS: full DVT Prophylaxis :scd Status is: Inpatient Remains inpatient appropriate because: N/V Pt requesting  to stay another nite I have asked her to ambulate    TOTAL TIME TAKING CARE OF THIS PATIENT: 35 minutes.  >50% time spent on counselling and coordination of care  Note: This dictation was prepared with Dragon dictation along with smaller phrase technology. Any transcriptional errors that result from this process are unintentional.  Yvette [name] M.D     [facility]   CC: Primary care physician; Center, [facility]

## 2022-06-25 ENCOUNTER — Encounter: Payer: Self-pay | Admitting: Internal Medicine

## 2022-06-25 ENCOUNTER — Inpatient Hospital Stay: Payer: Medicaid Other | Admitting: Anesthesiology

## 2022-06-25 ENCOUNTER — Encounter
Admission: EM | Disposition: A | Discharge status: Home / Self Care | Payer: Self-pay | Source: Home / Self Care | Attending: Internal Medicine

## 2022-06-25 HISTORY — PX: ESOPHAGOGASTRODUODENOSCOPY (EGD) WITH PROPOFOL: SHX5813

## 2022-06-25 LAB — POTASSIUM
Potassium: 2.6 mmol/L — CL (ref 3.5–5.1)
Potassium: 3.7 mmol/L (ref 3.5–5.1)
Potassium: 4.4 mmol/L (ref 3.5–5.1)

## 2022-06-25 LAB — MAGNESIUM: Magnesium: 1.9 mg/dL (ref 1.7–2.4)

## 2022-06-25 SURGERY — ESOPHAGOGASTRODUODENOSCOPY (EGD) WITH PROPOFOL
Anesthesia: General

## 2022-06-25 MED ORDER — LIDOCAINE HCL (PF) 2 % IJ SOLN
INTRAMUSCULAR | Status: AC
  Filled 2022-06-25: qty 5

## 2022-06-25 MED ORDER — PANTOPRAZOLE SODIUM 40 MG IV SOLR
40.0000 mg | Freq: Every day | INTRAVENOUS | Status: DC
  Administered 2022-06-25: 40 mg via INTRAVENOUS
  Filled 2022-06-25: qty 10

## 2022-06-25 MED ORDER — PROPOFOL 10 MG/ML IV BOLUS
INTRAVENOUS | Status: AC
  Filled 2022-06-25: qty 40

## 2022-06-25 MED ORDER — PROPOFOL 10 MG/ML IV BOLUS
INTRAVENOUS | Status: AC
  Filled 2022-06-25: qty 20

## 2022-06-25 MED ORDER — PANTOPRAZOLE SODIUM 40 MG PO TBEC
40.0000 mg | DELAYED_RELEASE_TABLET | Freq: Every day | ORAL | Status: DC
  Administered 2022-06-26: 40 mg via ORAL
  Filled 2022-06-25: qty 1

## 2022-06-25 MED ORDER — LIDOCAINE HCL (CARDIAC) PF 100 MG/5ML IV SOSY
PREFILLED_SYRINGE | INTRAVENOUS | Status: DC | PRN
  Administered 2022-06-25: 100 mg via INTRAVENOUS

## 2022-06-25 MED ORDER — THIAMINE MONONITRATE 100 MG PO TABS
100.0000 mg | ORAL_TABLET | Freq: Every day | ORAL | Status: DC
  Administered 2022-06-26: 100 mg via ORAL
  Filled 2022-06-25 (×2): qty 1

## 2022-06-25 MED ORDER — POTASSIUM CHLORIDE 10 MEQ/100ML IV SOLN
10.0000 meq | INTRAVENOUS | Status: AC
  Administered 2022-06-25 (×3): 10 meq via INTRAVENOUS
  Filled 2022-06-25 (×4): qty 100

## 2022-06-25 MED ORDER — DEXAMETHASONE SODIUM PHOSPHATE 10 MG/ML IJ SOLN
INTRAMUSCULAR | Status: AC
  Filled 2022-06-25: qty 1

## 2022-06-25 MED ORDER — PROPOFOL 10 MG/ML IV BOLUS
INTRAVENOUS | Status: DC | PRN
  Administered 2022-06-25 (×2): 30 mg via INTRAVENOUS
  Administered 2022-06-25: 120 mg via INTRAVENOUS
  Administered 2022-06-25: 30 mg via INTRAVENOUS

## 2022-06-25 MED ORDER — DEXAMETHASONE SODIUM PHOSPHATE 10 MG/ML IJ SOLN
INTRAMUSCULAR | Status: DC | PRN
  Administered 2022-06-25: 10 mg via INTRAVENOUS

## 2022-06-25 MED ORDER — POTASSIUM CHLORIDE 20 MEQ PO PACK
40.0000 meq | PACK | Freq: Once | ORAL | Status: AC
  Administered 2022-06-25: 40 meq via ORAL
  Filled 2022-06-25: qty 2

## 2022-06-25 MED ORDER — POTASSIUM CHLORIDE 20 MEQ PO PACK: 20.0000 meq | PACK | ORAL | Status: DC

## 2022-06-25 MED ORDER — SODIUM CHLORIDE 0.9 % IV SOLN: INTRAVENOUS | Status: DC

## 2022-06-25 NOTE — Interval H&P Note (Signed)
History and Physical Interval Note: Preprocedure H&P from 06/25/22  was reviewed and there was no interval change after seeing and examining the patient.  Written consent was obtained from the patient after discussion of risks, benefits, and alternatives. Patient has consented to proceed with Esophagogastroduodenoscopy with possible intervention   06/25/2022 1:59 PM  Yvette [name]  has presented today for surgery, with the diagnosis of intractable nausea, vomiting.  The various methods of treatment have been discussed with the patient and family. After consideration of risks, benefits and other options for treatment, the patient has consented to  Procedure(s): ESOPHAGOGASTRODUODENOSCOPY (EGD) WITH PROPOFOL (N/A) as a surgical intervention.  The patient's history has been reviewed, patient examined, no change in status, stable for surgery.  I have reviewed the patient's chart and labs.  Questions were answered to the patient's satisfaction.     Jaynie Collins

## 2022-06-25 NOTE — Transfer of Care (Signed)
Immediate Anesthesia Transfer of Care Note  Patient: Yvette [name]  Procedure(s) Performed: ESOPHAGOGASTRODUODENOSCOPY (EGD) WITH PROPOFOL  Patient Location: Endoscopy Unit  Anesthesia Type:General  Level of Consciousness: awake  Airway & Oxygen Therapy: Patient Spontanous Breathing  Post-op Assessment: Report given to RN and Post -op Vital signs reviewed and stable  Post vital signs: Reviewed and stable  Last Vitals:  Vitals Value Taken Time  BP 121/71 06/25/22 1457  Temp 36.7 C 06/25/22 1457  Pulse 74 06/25/22 1457  Resp 26 06/25/22 1457  SpO2 100 % 06/25/22 1457  Vitals shown include unvalidated device data.  Last Pain:  Vitals:   06/25/22 1457  TempSrc: Temporal  PainSc: 0-No pain         Complications: No notable events documented.

## 2022-06-25 NOTE — H&P (View-Only) (Signed)
GI Inpatient Consult Note  Reason for Consult: Intractable Nausea and Vomiting    Attending Requesting Consult: Dr. Fritzi Mandes  History of Present Illness: Yvette [name] is a 41 y.o. female seen for evaluation of intractable nausea and vomiting at the request of Dr. Posey Pronto.  Patient has a PMH of malnutrition, alcohol use disorder, alcohol withdrawal, tobacco use disorder, transaminitis, hepatic steatosis, recurrent hyperemesis syndrome,  cardiac arrest with VF after one dose of droperidol secondary to prolonged QTc (03/2022), esophageal stricture, severe esophagitis and gastric ulcers who presents to the emergency department 4 days ago with nausea and vomiting upper abdominal pain and diarrhea onset since Sun early. She was seen the day prior for the same symptoms and sent home.   She felt dehydrated like she may pass out and returned to the ED via EMS.  Upon presentation to the ED, vital signs were 136/78, 60, 16, 98.7. Labs significant for Hgb 9.8 , K 3.9, chloride 106, Mg 2.1, bicarb 20, BUN of 14, serum creatinine of 2.61, EGFR greater than 60, nonfasting glucose 100, WBC 6.2, hemoglobin 9.8, platelets of 256. CXR: clear.   She was admitted on 06/22/22 for intractable nausea and vomiting and dehydration. She treated with IV fluids and IV benzodiazepines for nausea and vomiting, Reglan, pantoprazole 40 mg daily.  No signs of pancreatitis. She tolerated FLD yest and plans to discharge today. She has been on a liquid diet and vomited again- most recent one hour ago.   She reports problem with N/V over the last 4 years with multiple ED visits and admissions. She has been followed by [facility] as an outpatient and most recent EGD was 02/2021. She takes pantoprazole 40 mg daily at home. She also has a RX for ibuprofen 800 mg that she takes 1-2 times per month for back pain. She reports family members had N/V/abdominal  pain and diarrhea when pt developed the same symptoms.    Now, no abdominal   pain, some nausea and last vomited one hour ago. She drank tea at 0930. No hematemesis or melena. She has found no triggers- maybe food. Vomiting is sometimes after eating. Emesis is food just eaten to neon yellow, bitter fluid. Vomiting does not relieve nausea. She has intact gallbladder. Reports normal BM until 4 days ago with diarrhea event. Passed small stool -little drops yesterday. No hematochezia. History of anemia and normal iron studies last year.   Reports last alcohol drink in Nov a 24 oz beer. She is seeing a behavioral therapist to help with cessation.    Last Colonoscopy: none  Last Endoscopy: 02/25/2022: EGD f/up severe esophagitis, N/V: esophageal mucosal changes, 4 cm HH, normal gastric and duodenal bulb. Pathology: Neg GIM, HP, Barrett's.    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 in the setting of n/v->gastritis/etoh.   Leukopenia    Neutropenia (HCC)    NICM (nonischemic cardiomyopathy) ([place])    a. 06/2021 Echo: EF 50-55%, [name]; b. 09/2021 ETT: Ex time 6:19, [name], [name]; c. 03/2022 Echo: EF 35-40%, [name].   NSVT (nonsustained ventricular tachycardia) ([place])  a. Noted during admission 06/2021 assoc w/ palpitations and presyncope in the setting of profound QT prolongation/gastritis.   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/03/2022   Dilated cardiomyopathy ([place])    Hypophosphatemia 04/01/2022   Acute hypoxemic respiratory failure ([place]) 06/30/7587   Chronic systolic CHF (congestive  heart failure) ([place]) 04/01/2022   COVID-19 virus infection 04/01/2022   Ventricular fibrillation ([place]) 03/30/2022   Hypomagnesemia 01/09/2022   Nausea vomiting and diarrhea 01/08/2022   Intractable nausea and vomiting 06/28/2021   Tobacco abuse 06/28/2021   Alcohol abuse 06/28/2021   Hematemesis 32/54/9826   Alcoholic liver disease, unspecified ([place]) 10/26/2020   Alcohol withdrawal ([place]) 41/58/3094   Alcoholic ketoacidosis 07/68/0881   AKI (acute kidney injury) ([place]) 10/26/2020   High serum osmolar gap 05/11/5944   Alcoholic cirrhosis of liver without ascites ([place])    Hiatal hernia    Portal hypertension (HCC)    Nausea & vomiting 85/92/9244   Alcoholic gastritis 62/86/3817   Intractable vomiting 10/01/2019   Alcohol use, daily 09/30/2019   Elevated LFTs 09/30/2019   Thrombocytopenia ([place]) 09/30/2019   Hypokalemia    Dehydration    Food poisoning    Other neutropenia ([place]) 06/11/2016   Leukopenia 02/03/2015    Past Surgical History: Past Surgical History:  Procedure Laterality Date   ESOPHAGOGASTRODUODENOSCOPY N/A 10/29/2020   Procedure: ESOPHAGOGASTRODUODENOSCOPY (EGD);  Surgeon: Jonathon Bellows, MD;  Location: [facility] ENDOSCOPY;  Service: Gastroenterology;  [name];   ESOPHAGOGASTRODUODENOSCOPY (EGD) WITH PROPOFOL N/A 03/14/2020   Procedure: ESOPHAGOGASTRODUODENOSCOPY (EGD) WITH PROPOFOL;  Surgeon: Virgel Manifold, MD;  [name];  Service: Endoscopy;  [name];   ESOPHAGOGASTRODUODENOSCOPY (EGD) WITH PROPOFOL N/A 02/25/2021   Procedure: ESOPHAGOGASTRODUODENOSCOPY (EGD) WITH PROPOFOL;  Surgeon: Virgel Manifold, MD;  [name];  Service: Endoscopy;  [name];    Allergies: No Known Allergies  Home Medications: Medications Prior to Admission  Medication Sig Dispense Refill Last Dose   DEPO-PROVERA 150 MG/ML injection Inject 150 mg into the muscle every 3 (three) months.   Past Month   naltrexone (DEPADE) 50 MG tablet Take  50 mg by mouth daily.   Past Month   pantoprazole (PROTONIX) 40 MG tablet Take 40 mg by mouth daily.   Past Month   Desvenlafaxine ER (PRISTIQ) 50 MG TB24 Take 1 tablet by mouth daily. (Patient not taking: Reported on 06/22/2022)   Not Taking   MACROBID 100 MG capsule Take 100 mg by mouth 2 (two) times daily. (Patient not taking: Reported on 06/22/2022)   Not Taking   Home medication reconciliation was completed with the patient.   Scheduled Inpatient Medications:    folic acid  1 mg Oral Daily   multivitamin with minerals  1 tablet Oral Daily   pantoprazole  40 mg Oral Daily   potassium chloride  40 mEq Oral Once    Continuous Inpatient Infusions:    potassium chloride     promethazine (PHENERGAN) injection (IM or IVPB)      PRN Inpatient Medications:  acetaminophen **OR** acetaminophen, metoCLOPramide (REGLAN) injection, nicotine, mouth rinse, promethazine (PHENERGAN) injection (IM or IVPB), senna-docusate  Family History: family history includes Cancer in her paternal aunt and paternal aunt; Lupus in her mother; Prostate cancer in her father.   Paternal UNCLE : colon cancer  Social History:  Positive tobacco and last alcohol use beer 24 oz in Nov. Hx of heavy alcohol use-. Denies illicit drug use.   Review of Systems: Constitutional: Weight is stable.  Eyes: No changes in vision. ENT: No oral lesions, sore throat.  GI: see HPI.  Heme/Lymph: No easy bruising.  CV: No chest pain.  GU: No hematuria.  Integumentary: No rashes.  Neuro: No headaches.  Psych: No depression/anxiety.  Endocrine: No heat/cold intolerance.  Allergic/Immunologic: No urticaria.  Resp: No cough, SOB.  Musculoskeletal: No joint swelling.    Physical Examination: BP (!) 145/87 (BP Location: Left Arm)   Pulse (!) 44   Temp 98.3 F (36.8 C)   Resp 16   Ht _0  (1.549 m)   Wt 52.6 kg   SpO2 100%   BMI 21.92 kg/m  Gen: NAD, alert and oriented x 4 HEENT:  EOMI,  Neck: supple, no JVD or  thyromegaly Chest: CTA bilaterally, no wheezes, crackles, or other adventitious sounds CV: RRR, no m/g/c/r Abd: soft, NT, ND, +BS in all four quadrants; no HSM, guarding, rigidity, or rebound tenderness Ext: no edema, well perfused with 2+ pulses, Skin: no rash or lesions noted Lymph: no LAD  Data: Lab Results  Component Value Date   WBC 4.3 06/23/2022   HGB 8.7 (L) 06/23/2022   HCT 28.2 (L) 06/23/2022   MCV 72.3 (L) 06/23/2022   PLT 231 06/23/2022   Recent Labs  Lab 06/21/22 1527 06/22/22 1220 06/23/22 0226  HGB 11.3* 9.8* 8.7*   Lab Results  Component Value Date   NA 139 06/23/2022   K 2.6 (LL) 06/25/2022   CL 108 06/23/2022   CO2 23 06/23/2022   BUN 10 06/23/2022   CREATININE 0.70 06/23/2022   GLU 76 01/01/2020   Lab Results  Component Value Date   ALT 16 06/22/2022   AST 28 06/22/2022   ALKPHOS 49 06/22/2022   BILITOT 0.7 06/22/2022   No results for input(s): "APTT", "INR", "PTT" in the last 168 hours. Assessment/Plan: Ms. Trevathan is a 41 y.o. female  Intractable nausea and vomiting  DDX: viral gastroenteritis given exposure  to family with similar symptoms, esophagitis/gastritis, biliary  -  influenza, COVID, RSV negative - Symptomatic support and avoiding QT prolongation agents - continue IV fluids -- PRN phenergan -- continue IV Protonix - EGD planned for this am- 11:3- since drank tea at 0930 - avoid Zofran and  medication that prolongs QT interval  Anemia:  - Stable Microcytic anemia with no melena or hematemesis reported - Outpatient w/up  Alcohol abuse  - reports working on cessation with behavioral therapist and  - reports last beer 24 oz in Nov.  - -  CIWA precautions per faciltity   Tobacco abuse  - Recommend cessation  Thank you for the consult. Please call with questions or concerns.  Denice Paradise, [facility] [phone]

## 2022-06-25 NOTE — Consult Note (Signed)
PHARMACY CONSULT NOTE  Pharmacy Consult for Electrolyte Monitoring and Replacement   Recent Labs: Potassium (mmol/L)  Date Value  06/25/2022 4.4   Magnesium (mg/dL)  Date Value  37/04/6268 1.9   Calcium (mg/dL)  Date Value  48/54/6270 8.9   Albumin (g/dL)  Date Value  35/00/9381 4.5  02/05/2021 4.9 (H)   Phosphorus (mg/dL)  Date Value  82/99/3716 3.7   Sodium (mmol/L)  Date Value  06/23/2022 139  02/05/2021 141   Assessment: 41 y/o F with medical history including EtOH use disorder / history of withdrawal, liver cirrhosis, history of cardiac arrest / Vfib secondary to prolonged QT after 1 dose of droperidol in 03/2022 who is admitted with intractable N/V. Pharmacy consulted to assist with electrolyte monitoring and replacement as indicated.  Goal of Therapy:  Electrolytes within normal limits Ideally maintain K > 4, Mg > 2 given her history of Vfib cardiac arrest / prolonged QT  Plan:  --K 2.6, Kcl 40 mEq PO x 1 and Kcl 10 mEq IV x 6 --Re-check potassium at 1800 today and all electrolytes again with AM labs  12/15 @ 1745  K= 4.4  no replacement at this time   Yvette [name] A 06/25/2022 6:24 PM

## 2022-06-25 NOTE — Op Note (Signed)
[facility] Patient Name: Yvette [name] Procedure Date: 06/25/2022 2:35 PM MRN: [id number] Account #: [id number] Date of Birth: 09/04/1980 Admit Type: Inpatient Age: 41 Room: [facility] ENDO ROOM 1 Gender: Female Note Status: Finalized Instrument Name: Upper Endoscope [id number] Procedure:             Upper GI endoscopy Indications:           Nausea with vomiting Providers:             Annamaria Helling DO, DO Referring MD:          Janene Madeira [facility] Medicines:             Monitored Anesthesia Care Complications:         No immediate complications. Estimated blood loss: None. Procedure:             Pre-Anesthesia Assessment:                        - Prior to the procedure, a History and Physical was                         performed, and patient medications and allergies were                         reviewed. The patient is competent. The risks and                         benefits of the procedure and the sedation options and                         risks were discussed with the patient. All questions                         were answered and informed consent was obtained.                         Patient identification and proposed procedure were                         verified by the physician, the nurse, the anesthetist                         and the technician in the endoscopy suite. Mental                         Status Examination: alert and oriented. Airway                         Examination: normal oropharyngeal airway and neck                         mobility. Respiratory Examination: clear to                         auscultation. CV Examination: bradycardia noted.                         Prophylactic Antibiotics: The patient does not require  prophylactic antibiotics. Prior Anticoagulants: The                         patient has taken no anticoagulant or antiplatelet                         agents. ASA  Grade Assessment: II - A patient with mild                         systemic disease. After reviewing the risks and                         benefits, the patient was deemed in satisfactory                         condition to undergo the procedure. The anesthesia                         plan was to use monitored anesthesia care (MAC).                         Immediately prior to administration of medications,                         the patient was re-assessed for adequacy to receive                         sedatives. The heart rate, respiratory rate, oxygen                         saturations, blood pressure, adequacy of pulmonary                         ventilation, and response to care were monitored                         throughout the procedure. The physical status of the                         patient was re-assessed after the procedure.                        After obtaining informed consent, the endoscope was                         passed under direct vision. Throughout the procedure,                         the patient's blood pressure, pulse, and oxygen                         saturations were monitored continuously. The Endoscope                         was introduced through the mouth, and advanced to the                         second part of duodenum. The upper GI endoscopy was  accomplished without difficulty. The patient tolerated                         the procedure well. Findings:      The duodenal bulb, first portion of the duodenum and second portion of       the duodenum were normal. Estimated blood loss: none.      A 2 cm hiatal hernia was present. Estimated blood loss: none.      The entire examined stomach was normal. Estimated blood loss: none.      The Z-line was regular. Estimated blood loss: none.      Esophagogastric landmarks were identified: the gastroesophageal junction       was found at 35 cm from the incisors.      The examined  esophagus was normal. Estimated blood loss: none. Impression:            - Normal duodenal bulb, first portion of the duodenum                         and second portion of the duodenum.                        - 2 cm hiatal hernia.                        - Normal stomach.                        - Z-line regular.                        - Esophagogastric landmarks identified.                        - Normal esophagus.                        - No specimens collected. Recommendation:        - Patient has a contact number available for                         emergencies. The signs and symptoms of potential                         delayed complications were discussed with the patient.                         Return to normal activities tomorrow. Written                         discharge instructions were provided to the patient.                        - Return patient to hospital ward for ongoing care.                        - Advance diet as tolerated and full liquid diet.                        - Continue present medications.                        -  Continue protonix and phenergan. Can consider                         pre-emptive anti emetics prior to meals for additional                         help.                        Compazine can be used as an additional agent.                        Increased protein diet.                        Supplemental thiamine recommended.                        No structural issues on the patient's exam. She has                         undergone previous imaging studies with no remarkable                         findings.                        Suspect functional GI disorder in combination with h/o                         etoh use.                        can consider psychiatric evaluation.                        patient given steroids during procedure via anesthesia                         as this helped her in the past as well.                        -  I anticipate no further need for intervention.                        - Return to primary gastroenterologist as outpatient.                        - The findings and recommendations were discussed with                         the patient. Procedure Code(s):     --- Professional ---                        [phone], Esophagogastroduodenoscopy, flexible,                         transoral; diagnostic, including collection of                         specimen(s) by brushing or washing, when performed                         (  separate procedure) Diagnosis Code(s):     --- Professional ---                        K44.9, Diaphragmatic hernia without obstruction or                         gangrene                        R11.2, Nausea with vomiting, unspecified CPT copyright 2022 American Medical Association. All rights reserved. The codes documented in this report are preliminary and upon coder review may  be revised to meet current compliance requirements. Attending Participation:      I personally performed the entire procedure. Volney American, DO Annamaria Helling DO, DO 06/25/2022 3:07:51 PM This report has been signed electronically. Number of Addenda: 0 Note Initiated On: 06/25/2022 2:35 PM Estimated Blood Loss:  Estimated blood loss: none.      [place] [facility]

## 2022-06-25 NOTE — Plan of Care (Signed)

## 2022-06-25 NOTE — Anesthesia Preprocedure Evaluation (Addendum)
Anesthesia Evaluation  Patient identified by MRN, date of birth, ID band Patient awake  General Assessment Comment:  Patient actively retching/bringing up watery mucus into emesis bag in front of me.  Reviewed: Allergy & Precautions, NPO status , Patient's Chart, lab work & pertinent test results  History of Anesthesia Complications (+) PONVNegative for: history of anesthetic complications  Airway Mallampati: I  TM Distance: >3 FB Neck ROM: Full    Dental no notable dental hx. (+) Teeth Intact   Pulmonary asthma , neg sleep apnea, neg COPD, Current Smoker and Patient abstained from smoking.   Pulmonary exam normal breath sounds clear to auscultation       Cardiovascular Exercise Tolerance: Good METS(-) hypertension(-) CAD and (-) Past MI (-) dysrhythmias  Rhythm:Regular Rate:Normal - Systolic murmurs    Neuro/Psych negative neurological ROS  negative psych ROS   GI/Hepatic hiatal hernia,GERD  ,,(+) Cirrhosis     substance abuse  alcohol use  Endo/Other  neg diabetes    Renal/GU negative Renal ROS     Musculoskeletal   Abdominal   Peds  Hematology  (+) Blood dyscrasia thrombocytopenia   Anesthesia Other Findings Past Medical History: No date: Asthma No date: H/O 1 disease     Comment:  as 90 month old child and at 61 years No date: History of anemia 1984: History of blood transfusion No date: Leukopenia No date: Neutropenia (HCC)  Reproductive/Obstetrics                             Anesthesia Physical Anesthesia Plan  ASA: 3  Anesthesia Plan: General   Post-op Pain Management:    Induction: Intravenous and Rapid sequence  PONV Risk Score and Plan: 3 and Dexamethasone, Propofol infusion, TIVA, Promethazine and Diphenhydramine  Airway Management Planned: Oral ETT  Additional Equipment: None  Intra-op Plan:   Post-operative Plan: Extubation in OR  Informed  Consent: I have reviewed the patients History and Physical, chart, labs and discussed the procedure including the risks, benefits and alternatives for the proposed anesthesia with the patient or authorized representative who has indicated his/her understanding and acceptance.     Dental advisory given  Plan Discussed with: CRNA and Surgeon  Anesthesia Plan Comments: (Patient consented for risks of anesthesia including but not limited to:  - adverse reactions to medications - risk of airway placement if required - damage to eyes, teeth, lips or other oral mucosa - nerve damage due to positioning  - sore throat or hoarseness - Damage to heart, brain, nerves, lungs, other parts of body or loss of life  Patient voiced understanding.)        Anesthesia Quick Evaluation

## 2022-06-25 NOTE — Consult Note (Addendum)
GI Inpatient Consult Note  Reason for Consult: Intractable Nausea and Vomiting    Attending Requesting Consult: Dr. Fritzi Mandes  History of Present Illness: Yvette [name] is a 41 y.o. female seen for evaluation of intractable nausea and vomiting at the request of Dr. Posey Pronto.  Patient has a PMH of malnutrition, alcohol use disorder, alcohol withdrawal, tobacco use disorder, transaminitis, hepatic steatosis, recurrent hyperemesis syndrome,  cardiac arrest with VF after one dose of droperidol secondary to prolonged QTc (03/2022), esophageal stricture, severe esophagitis and gastric ulcers who presents to the emergency department 4 days ago with nausea and vomiting upper abdominal pain and diarrhea onset since Sun early. She was seen the day prior for the same symptoms and sent home.   She felt dehydrated like she may pass out and returned to the ED via EMS.  Upon presentation to the ED, vital signs were 136/78, 60, 16, 98.7. Labs significant for Hgb 9.8 , K 3.9, chloride 106, Mg 2.1, bicarb 20, BUN of 14, serum creatinine of 2.61, EGFR greater than 60, nonfasting glucose 100, WBC 6.2, hemoglobin 9.8, platelets of 256. CXR: clear.   She was admitted on 06/22/22 for intractable nausea and vomiting and dehydration. She treated with IV fluids and IV benzodiazepines for nausea and vomiting, Reglan, pantoprazole 40 mg daily.  No signs of pancreatitis. She tolerated FLD yest and plans to discharge today. She has been on a liquid diet and vomited again- most recent one hour ago.   She reports problem with N/V over the last 4 years with multiple ED visits and admissions. She has been followed by [facility] as an outpatient and most recent EGD was 02/2021. She takes pantoprazole 40 mg daily at home. She also has a RX for ibuprofen 800 mg that she takes 1-2 times per month for back pain. She reports family members had N/V/abdominal  pain and diarrhea when pt developed the same symptoms.    Now, no abdominal   pain, some nausea and last vomited one hour ago. She drank tea at 0930. No hematemesis or melena. She has found no triggers- maybe food. Vomiting is sometimes after eating. Emesis is food just eaten to neon yellow, bitter fluid. Vomiting does not relieve nausea. She has intact gallbladder. Reports normal BM until 4 days ago with diarrhea event. Passed small stool -little drops yesterday. No hematochezia. History of anemia and normal iron studies last year.   Reports last alcohol drink in Nov a 24 oz beer. She is seeing a behavioral therapist to help with cessation.    Last Colonoscopy: none  Last Endoscopy: 02/25/2022: EGD f/up severe esophagitis, N/V: esophageal mucosal changes, 4 cm HH, normal gastric and duodenal bulb. Pathology: Neg GIM, HP, Barrett's.    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 in the setting of n/v->gastritis/etoh.   Leukopenia    Neutropenia (HCC)    NICM (nonischemic cardiomyopathy) ([place])    a. 06/2021 Echo: EF 50-55%, [name]; b. 09/2021 ETT: Ex time 6:19, [name], [name]; c. 03/2022 Echo: EF 35-40%, [name].   NSVT (nonsustained ventricular tachycardia) ([place])  a. Noted during admission 06/2021 assoc w/ palpitations and presyncope in the setting of profound QT prolongation/gastritis.   Tobacco abuse    Transaminitis     Problem List: Patient Active Problem List   Diagnosis Date Noted   Suicide ideation 04/05/2022   Delirium tremens ([place]) 04/05/2022   Hypovolemic shock ([place]) 04/05/2022   Alcohol-induced mood disorder ([place]) 04/03/2022   Dilated cardiomyopathy ([place])    Hypophosphatemia 04/01/2022   Acute hypoxemic respiratory failure ([place]) 49/67/5916   Chronic systolic CHF (congestive  heart failure) ([place]) 04/01/2022   COVID-19 virus infection 04/01/2022   Ventricular fibrillation ([place]) 03/30/2022   Hypomagnesemia 01/09/2022   Nausea vomiting and diarrhea 01/08/2022   Intractable nausea and vomiting 06/28/2021   Tobacco abuse 06/28/2021   Alcohol abuse 06/28/2021   Hematemesis 38/46/6599   Alcoholic liver disease, unspecified ([place]) 10/26/2020   Alcohol withdrawal ([place]) 35/70/1779   Alcoholic ketoacidosis 39/09/90   AKI (acute kidney injury) ([place]) 10/26/2020   High serum osmolar gap 33/00/7622   Alcoholic cirrhosis of liver without ascites ([place])    Hiatal hernia    Portal hypertension (HCC)    Nausea & vomiting 63/33/5456   Alcoholic gastritis 25/63/8937   Intractable vomiting 10/01/2019   Alcohol use, daily 09/30/2019   Elevated LFTs 09/30/2019   Thrombocytopenia ([place]) 09/30/2019   Hypokalemia    Dehydration    Food poisoning    Other neutropenia ([place]) 06/11/2016   Leukopenia 02/03/2015    Past Surgical History: Past Surgical History:  Procedure Laterality Date   ESOPHAGOGASTRODUODENOSCOPY N/A 10/29/2020   Procedure: ESOPHAGOGASTRODUODENOSCOPY (EGD);  Surgeon: Jonathon Bellows, MD;  Location: [facility] ENDOSCOPY;  Service: Gastroenterology;  [name];   ESOPHAGOGASTRODUODENOSCOPY (EGD) WITH PROPOFOL N/A 03/14/2020   Procedure: ESOPHAGOGASTRODUODENOSCOPY (EGD) WITH PROPOFOL;  Surgeon: Virgel Manifold, MD;  [name];  Service: Endoscopy;  [name];   ESOPHAGOGASTRODUODENOSCOPY (EGD) WITH PROPOFOL N/A 02/25/2021   Procedure: ESOPHAGOGASTRODUODENOSCOPY (EGD) WITH PROPOFOL;  Surgeon: Virgel Manifold, MD;  [name];  Service: Endoscopy;  [name];    Allergies: No Known Allergies  Home Medications: Medications Prior to Admission  Medication Sig Dispense Refill Last Dose   DEPO-PROVERA 150 MG/ML injection Inject 150 mg into the muscle every 3 (three) months.   Past Month   naltrexone (DEPADE) 50 MG tablet Take  50 mg by mouth daily.   Past Month   pantoprazole (PROTONIX) 40 MG tablet Take 40 mg by mouth daily.   Past Month   Desvenlafaxine ER (PRISTIQ) 50 MG TB24 Take 1 tablet by mouth daily. (Patient not taking: Reported on 06/22/2022)   Not Taking   MACROBID 100 MG capsule Take 100 mg by mouth 2 (two) times daily. (Patient not taking: Reported on 06/22/2022)   Not Taking   Home medication reconciliation was completed with the patient.   Scheduled Inpatient Medications:    folic acid  1 mg Oral Daily   multivitamin with minerals  1 tablet Oral Daily   pantoprazole  40 mg Oral Daily   potassium chloride  40 mEq Oral Once    Continuous Inpatient Infusions:    potassium chloride     promethazine (PHENERGAN) injection (IM or IVPB)      PRN Inpatient Medications:  acetaminophen **OR** acetaminophen, metoCLOPramide (REGLAN) injection, nicotine, mouth rinse, promethazine (PHENERGAN) injection (IM or IVPB), senna-docusate  Family History: family history includes Cancer in her paternal aunt and paternal aunt; Lupus in her mother; Prostate cancer in her father.   Paternal UNCLE : colon cancer  Social History:  Positive tobacco and last alcohol use beer 24 oz in Nov. Hx of heavy alcohol use-. Denies illicit drug use.   Review of Systems: Constitutional: Weight is stable.  Eyes: No changes in vision. ENT: No oral lesions, sore throat.  GI: see HPI.  Heme/Lymph: No easy bruising.  CV: No chest pain.  GU: No hematuria.  Integumentary: No rashes.  Neuro: No headaches.  Psych: No depression/anxiety.  Endocrine: No heat/cold intolerance.  Allergic/Immunologic: No urticaria.  Resp: No cough, SOB.  Musculoskeletal: No joint swelling.    Physical Examination: BP (!) 145/87 (BP Location: Left Arm)   Pulse (!) 44   Temp 98.3 F (36.8 C)   Resp 16   Ht _0  (1.549 m)   Wt 52.6 kg   SpO2 100%   BMI 21.92 kg/m  Gen: NAD, alert and oriented x 4 HEENT:  EOMI,  Neck: supple, no JVD or  thyromegaly Chest: CTA bilaterally, no wheezes, crackles, or other adventitious sounds CV: RRR, no m/g/c/r Abd: soft, NT, ND, +BS in all four quadrants; no HSM, guarding, rigidity, or rebound tenderness Ext: no edema, well perfused with 2+ pulses, Skin: no rash or lesions noted Lymph: no LAD  Data: Lab Results  Component Value Date   WBC 4.3 06/23/2022   HGB 8.7 (L) 06/23/2022   HCT 28.2 (L) 06/23/2022   MCV 72.3 (L) 06/23/2022   PLT 231 06/23/2022   Recent Labs  Lab 06/21/22 1527 06/22/22 1220 06/23/22 0226  HGB 11.3* 9.8* 8.7*   Lab Results  Component Value Date   NA 139 06/23/2022   K 2.6 (LL) 06/25/2022   CL 108 06/23/2022   CO2 23 06/23/2022   BUN 10 06/23/2022   CREATININE 0.70 06/23/2022   GLU 76 01/01/2020   Lab Results  Component Value Date   ALT 16 06/22/2022   AST 28 06/22/2022   ALKPHOS 49 06/22/2022   BILITOT 0.7 06/22/2022   No results for input(s): "APTT", "INR", "PTT" in the last 168 hours. Assessment/Plan: Ms. Trevathan is a 41 y.o. female  Intractable nausea and vomiting  DDX: viral gastroenteritis given exposure  to family with similar symptoms, esophagitis/gastritis, biliary  -  influenza, COVID, RSV negative - Symptomatic support and avoiding QT prolongation agents - continue IV fluids -- PRN phenergan -- continue IV Protonix - EGD planned for this am- 11:3- since drank tea at 0930 - avoid Zofran and  medication that prolongs QT interval  Anemia:  - Stable Microcytic anemia with no melena or hematemesis reported - Outpatient w/up  Alcohol abuse  - reports working on cessation with behavioral therapist and  - reports last beer 24 oz in Nov.  - -  CIWA precautions per faciltity   Tobacco abuse  - Recommend cessation  Thank you for the consult. Please call with questions or concerns.  Denice Paradise, [facility] [phone]

## 2022-06-25 NOTE — Progress Notes (Signed)
Date and time results received: 06/25/22 10:03 (use smartphrase ".now" to insert current time)  Test: potassium  Critical Value: 2.6  Name of Provider Notified: patel  Orders Received? Or Actions Taken?: see chart

## 2022-06-25 NOTE — Progress Notes (Signed)
[facility]  - [place] at [facility]   PATIENT NAME: Yvette [name]    MR#:  [id number]  DATE OF BIRTH:  Sep 12, 1980  SUBJECTIVE:  patient had one episode of vomiting after supper yesterday. Patient toils made was a small amount two hours after she ate her meal. Not much active. She reports she went for a walk yesterday. \No family at bedside.   VITALS:  Blood pressure 137/81, pulse (!) 51, temperature 98 F (36.7 C), temperature source Temporal, resp. rate 13, height 5\' 1"  (1.549 m), weight 52.6 kg, SpO2 100 %.  PHYSICAL EXAMINATION:   GENERAL:  41 y.o.-year-old patient lying in the bed with no acute distress.  LUNGS: Normal breath sounds bilaterally, no wheezing CARDIOVASCULAR: S1, S2 normal. No murmur   ABDOMEN: Soft, nontender, nondistended. Bowel sounds present.  EXTREMITIES: No  edema b/l.    NEUROLOGIC: nonfocal  patient is alert and awake SKIN: No obvious rash, lesion, or ulcer.   LABORATORY PANEL:  CBC Recent Labs  Lab 06/23/22 0226  WBC 4.3  HGB 8.7*  HCT 28.2*  PLT 231     Chemistries  Recent Labs  Lab 06/22/22 1220 06/23/22 0226 06/25/22 0930 06/25/22 0936 06/25/22 1311  NA 137 139  --   --   --   K 3.9 3.0*   < >  --  3.7  CL 106 108  --   --   --   CO2 21* 23  --   --   --   GLUCOSE 100* 93  --   --   --   BUN 14 10  --   --   --   CREATININE 0.61 0.70  --   --   --   CALCIUM 9.4 8.9  --   --   --   MG 2.1 1.9  --  1.9  --   AST 28  --   --   --   --   ALT 16  --   --   --   --   ALKPHOS 49  --   --   --   --   BILITOT 0.7  --   --   --   --    < > = values in this interval not displayed.    Cardiac Enzymes No results for input(s): "TROPONINI" in the last 168 hours. RADIOLOGY:  No results found.  Assessment and Plan  Yvette [name] is a 41 year old female with history of alcohol abuse and dependence., liver cirrhosis, history of alcohol withdrawal, history of cardiac arrest, ventricular fibrillation secondary to prolonged  QT after 1 dose of droperidol in September 2023, who presents emergency department for chief concerns of intractable nausea and vomiting.   Intractable nausea and vomiting history of Gerd/PUD - Query viral gastroenteritis given recent exposure to individuals with similar symptoms -- influenza, COVID, RSV negative - Symptomatic support and avoiding QT prolongation agents -- continue po Protonix -- G.I. consultation with Dr. 11-28-1985. Patient underwent a EGD which is essentially normal. --?neurogenic/functional  Alcohol abuse - With alcohol dependence and history of alcohol withdrawal  - Patient reports that she has not had an alcoholic beverage in over 1 week - cont thiamine, folate, and multivitamin supplementation --no s/o WD   Tobacco abuse - As needed nicotine patch for nicotine craving ordered   Portal hypertension (HCC) - Secondary to history of alcohol abuse and dependence - Continue alcohol cessation and to follow-up with gastroenterologist/hepatologist as appropriate  H/o  V FIB --pt remains in NSR. Per Rn does not want to wear Tele monitor  Procedures:EGD Family communication :none Consults :none CODE STATUS: full DVT Prophylaxis :scd Status is: Inpatient Remains inpatient appropriate because: N/V  likleydischarge tomorrow TOTAL TIME TAKING CARE OF THIS PATIENT: 35 minutes.  >50% time spent on counselling and coordination of care  Note: This dictation was prepared with Dragon dictation along with smaller phrase technology. Any transcriptional errors that result from this process are unintentional.  Yvette [name] M.D    [facility]   CC: Primary care physician; Center, [facility]

## 2022-06-25 NOTE — Anesthesia Postprocedure Evaluation (Signed)
Anesthesia Post Note  Patient: Yvette [name]  Procedure(s) Performed: ESOPHAGOGASTRODUODENOSCOPY (EGD) WITH PROPOFOL  Patient location during evaluation: PACU Anesthesia Type: General Level of consciousness: awake and alert Pain management: pain level controlled Vital Signs Assessment: post-procedure vital signs reviewed and stable Respiratory status: spontaneous breathing, nonlabored ventilation, respiratory function stable and patient connected to nasal cannula oxygen Cardiovascular status: blood pressure returned to baseline and stable Postop Assessment: no apparent nausea or vomiting Anesthetic complications: no  No notable events documented.   Last Vitals:  Vitals:   06/25/22 1434 06/25/22 1457  BP: (!) 147/85 121/71  Pulse: (!) 48 74  Resp: 18 18  Temp: (!) 36.1 C 36.7 C  SpO2: 100% 100%    Last Pain:  Vitals:   06/25/22 1457  TempSrc: Temporal  PainSc: 0-No pain                 Stephanie Coup

## 2022-06-25 NOTE — Consult Note (Signed)
PHARMACY CONSULT NOTE  Pharmacy Consult for Electrolyte Monitoring and Replacement   Recent Labs: Potassium (mmol/L)  Date Value  06/25/2022 2.6 (LL)   Magnesium (mg/dL)  Date Value  61/60/7371 1.9   Calcium (mg/dL)  Date Value  01/05/9484 8.9   Albumin (g/dL)  Date Value  46/27/0350 4.5  02/05/2021 4.9 (H)   Phosphorus (mg/dL)  Date Value  09/38/1829 3.7   Sodium (mmol/L)  Date Value  06/23/2022 139  02/05/2021 141   Assessment: 41 y/o F with medical history including EtOH use disorder / history of withdrawal, liver cirrhosis, history of cardiac arrest / Vfib secondary to prolonged QT after 1 dose of droperidol in 03/2022 who is admitted with intractable N/V. Pharmacy consulted to assist with electrolyte monitoring and replacement as indicated.  Goal of Therapy:  Electrolytes within normal limits Ideally maintain K > 4, Mg > 2 given her history of Vfib cardiac arrest / prolonged QT  Plan:  --K 2.6, Kcl 40 mEq PO x 1 and Kcl 10 mEq IV x 6 --Re-check potassium at 1800 today and all electrolytes again with AM labs  Yvette [name] 06/25/2022 12:11 PM

## 2022-06-26 MED ORDER — ADULT MULTIVITAMIN W/MINERALS CH: 1.0000 | ORAL_TABLET | Freq: Every day | ORAL | 0 refills | Status: DC

## 2022-06-26 MED ORDER — VITAMIN B-1 100 MG PO TABS: 100.0000 mg | ORAL_TABLET | Freq: Every day | ORAL | 0 refills | Status: DC

## 2022-06-26 MED ORDER — FOLIC ACID 1 MG PO TABS: 1.0000 mg | ORAL_TABLET | Freq: Every day | ORAL | 0 refills | Status: DC

## 2022-06-26 MED ORDER — PROMETHAZINE HCL 12.5 MG PO TABS: 12.5000 mg | ORAL_TABLET | Freq: Two times a day (BID) | ORAL | 0 refills | Status: DC | PRN

## 2022-06-26 MED ORDER — NICOTINE 21 MG/24HR TD PT24: 21.0000 mg | MEDICATED_PATCH | Freq: Every day | TRANSDERMAL | 0 refills | Status: DC | PRN

## 2022-06-26 MED ORDER — PANTOPRAZOLE SODIUM 40 MG PO TBEC: 40.0000 mg | DELAYED_RELEASE_TABLET | Freq: Every day | ORAL | 2 refills | Status: DC

## 2022-06-26 NOTE — Plan of Care (Signed)

## 2022-06-26 NOTE — Discharge Summary (Signed)
Physician Discharge Summary   Patient: Yvette [name] MRN: [id number] DOB: March 30, 1981  Admit date:     06/22/2022  Discharge date: 06/26/22  Discharge Physician: Enedina Finner   PCP: [facility]   Recommendations at discharge:   follow-up with PCP in 1 to 2 weeks  Discharge Diagnoses: Principal Problem:   Intractable nausea and vomiting Active Problems:   Intractable vomiting   Portal hypertension (HCC)   Tobacco abuse   Alcohol abuse   Hypomagnesemia   Hypophosphatemia  Yvette [name] is a 41 year old female with history of alcohol abuse and dependence., liver cirrhosis, history of alcohol withdrawal, history of cardiac arrest, ventricular fibrillation secondary to prolonged QT after 1 dose of droperidol in September 2023, who presents emergency department for chief concerns of intractable nausea and vomiting.    Intractable nausea and vomiting history of Gerd/PUD - Query viral gastroenteritis given recent exposure to individuals with similar symptoms -- influenza, COVID, RSV negative - Symptomatic support and avoiding QT prolongation agents -- continue po Protonix -- G.I. consultation with Dr. Timothy Lasso. Patient underwent a EGD which is essentially normal. --pt tolerated CLD. She was asked to advance slowly in few days --Phenergan #10 pills ws rxed   Alcohol abuse - With alcohol dependence and history of alcohol withdrawal  - Patient reports that she has not had an alcoholic beverage in over 1 week - cont thiamine, folate, and multivitamin supplementation --no s/o WD   Tobacco abuse - As needed nicotine patch for nicotine craving ordered   Portal hypertension (HCC) Liver Cirrhosis - Secondary to history of alcohol abuse and dependence - Continue alcohol cessation and to follow-up with gastroenterologist/hepatologist as appropriate   H/o V FIB --pt remains in NSR. Per Rn does not want to wear Tele monitor   Procedures:EGD Family communication  :none Consults :none CODE STATUS: full DVT Prophylaxis :scd     Diet recommendation:  Discharge Diet Orders (From admission, onward)     Start     Ordered   06/26/22 0000  Diet full liquid        06/26/22 1122           Full liquid diet DISCHARGE MEDICATION: Allergies as of 06/26/2022   No Known Allergies      Medication List     STOP taking these medications    Desvenlafaxine ER 50 MG Tb24 Commonly known as: PRISTIQ   Macrobid 100 MG capsule Generic drug: nitrofurantoin (macrocrystal-monohydrate)       TAKE these medications    Depo-Provera 150 MG/ML injection Generic drug: medroxyPROGESTERone Inject 150 mg into the muscle every 3 (three) months.   folic acid 1 MG tablet Commonly known as: FOLVITE Take 1 tablet (1 mg total) by mouth daily. Start taking on: June 27, 2022   multivitamin with minerals Tabs tablet Take 1 tablet by mouth daily. Start taking on: June 27, 2022   naltrexone 50 MG tablet Commonly known as: DEPADE Take 50 mg by mouth daily.   nicotine 21 mg/24hr patch Commonly known as: NICODERM CQ - dosed in mg/24 hours Place 1 patch (21 mg total) onto the skin daily as needed (nicotine craving).   pantoprazole 40 MG tablet Commonly known as: PROTONIX Take 1 tablet (40 mg total) by mouth daily.   promethazine 12.5 MG tablet Commonly known as: PHENERGAN Take 1 tablet (12.5 mg total) by mouth every 12 (twelve) hours as needed for nausea or vomiting.   thiamine 100 MG tablet Commonly known as: Vitamin B-1 Take  1 tablet (100 mg total) by mouth daily. Start taking on: June 27, 2022        Discharge Exam: [name] Weights   06/22/22 1206  Weight: 52.6 kg     Condition at discharge: fair  The results of significant diagnostics from this hospitalization (including imaging, microbiology, ancillary and laboratory) are listed below for reference.   Imaging Studies: DG Chest Port 1 View  Result Date:  06/22/2022 CLINICAL DATA:  Nausea and vomiting for 2-3 days EXAM: PORTABLE CHEST 1 VIEW COMPARISON:  03/30/2022 FINDINGS: Cardiac and mediastinal margins appear normal. The patient is rotated to the left on today's radiograph, reducing diagnostic sensitivity and specificity. The lungs appear clear, with subtle increased density in the right hemithorax compared to the left likely related to leftward rotation and distribution of soft tissues of the chest wall/breasts. No blunting of the costophrenic angles. No significant bony abnormality observed. IMPRESSION: 1. No active cardiopulmonary disease is radiographically apparent. Electronically Signed   By: Gaylyn Rong M.D.   On: 06/22/2022 17:32    Microbiology: Results for orders placed or performed during the hospital encounter of 06/22/22  Resp panel by RT-PCR (RSV, Flu A&B, Covid) Anterior Nasal Swab     Status: None   Collection Time: 06/22/22  4:37 PM   Specimen: Anterior Nasal Swab  Result Value Ref Range Status   SARS Coronavirus 2 by RT PCR NEGATIVE NEGATIVE Final    Comment: (NOTE) SARS-CoV-2 target nucleic acids are NOT DETECTED.  The SARS-CoV-2 RNA is generally detectable in upper respiratory specimens during the acute phase of infection. The lowest concentration of SARS-CoV-2 viral copies this assay can detect is 138 copies/mL. A negative result does not preclude SARS-Cov-2 infection and should not be used as the sole basis for treatment or other patient management decisions. A negative result may occur with  improper specimen collection/handling, submission of specimen other than nasopharyngeal swab, presence of viral mutation(s) within the areas targeted by this assay, and inadequate number of viral copies(<138 copies/mL). A negative result must be combined with clinical observations, patient history, and epidemiological information. The expected result is Negative.  Fact Sheet for Patients:   BloggerCourse.com  Fact Sheet for Healthcare Providers:  SeriousBroker.it  This test is no t yet approved or cleared by the [place] FDA and  has been authorized for detection and/or diagnosis of SARS-CoV-2 by FDA under an Emergency Use Authorization (EUA). This EUA will remain  in effect (meaning this test can be used) for the duration of the COVID-19 declaration under Section 564(b)(1) of the Act, 21 U.S.C.section 360bbb-3(b)(1), unless the authorization is terminated  or revoked sooner.       Influenza A by PCR NEGATIVE NEGATIVE Final   Influenza B by PCR NEGATIVE NEGATIVE Final    Comment: (NOTE) The Xpert Xpress SARS-CoV-2/FLU/RSV plus assay is intended as an aid in the diagnosis of influenza from Nasopharyngeal swab specimens and should not be used as a sole basis for treatment. Nasal washings and aspirates are unacceptable for Xpert Xpress SARS-CoV-2/FLU/RSV testing.  Fact Sheet for Patients: BloggerCourse.com  Fact Sheet for Healthcare Providers: SeriousBroker.it  This test is not yet approved or cleared by the [place] FDA and has been authorized for detection and/or diagnosis of SARS-CoV-2 by FDA under an Emergency Use Authorization (EUA). This EUA will remain in effect (meaning this test can be used) for the duration of the COVID-19 declaration under Section 564(b)(1) of the Act, 21 U.S.C. section 360bbb-3(b)(1), unless the authorization is terminated  or revoked.     Resp Syncytial Virus by PCR NEGATIVE NEGATIVE Final    Comment: (NOTE) Fact Sheet for Patients: BloggerCourse.com  Fact Sheet for Healthcare Providers: SeriousBroker.it  This test is not yet approved or cleared by the [place] FDA and has been authorized for detection and/or diagnosis of SARS-CoV-2 by FDA under an Emergency Use  Authorization (EUA). This EUA will remain in effect (meaning this test can be used) for the duration of the COVID-19 declaration under Section 564(b)(1) of the Act, 21 U.S.C. section 360bbb-3(b)(1), unless the authorization is terminated or revoked.  Performed at [facility], [address] [place], [place] [address]     Labs: CBC: Recent Labs  Lab 06/21/22 1527 06/22/22 1220 06/23/22 0226  WBC 7.2 6.2 4.3  HGB 11.3* 9.8* 8.7*  HCT 39.1 32.3* 28.2*  MCV 76.7* 73.1* 72.3*  PLT 288 256 231   Basic Metabolic Panel: Recent Labs  Lab 06/21/22 1527 06/22/22 1210 06/22/22 1220 06/23/22 0226 06/25/22 0930 06/25/22 0936 06/25/22 1311 06/25/22 1745  NA 139  --  137 139  --   --   --   --   K 3.5  --  3.9 3.0* 2.6*  --  3.7 4.4  CL 105  --  106 108  --   --   --   --   CO2 20*  --  21* 23  --   --   --   --   GLUCOSE 103*  --  100* 93  --   --   --   --   BUN 17  --  14 10  --   --   --   --   CREATININE 0.80  --  0.61 0.70  --   --   --   --   CALCIUM 10.1  --  9.4 8.9  --   --   --   --   MG  --   --  2.1 1.9  --  1.9  --   --   PHOS  --  3.7  --   --   --   --   --   --    Liver Function Tests: Recent Labs  Lab 06/21/22 1527 06/22/22 1220  AST 32 28  ALT 14 16  ALKPHOS 57 49  BILITOT 0.4 0.7  PROT 9.9* 8.5*  ALBUMIN 5.2* 4.5   CBG: No results for input(s): "GLUCAP" in the last 168 hours.  Discharge time spent: greater than 30 minutes.  Signed: Enedina Finner, MD [facility] 06/26/2022

## 2022-06-26 NOTE — Consult Note (Signed)
PHARMACY CONSULT NOTE  Pharmacy Consult for Electrolyte Monitoring and Replacement   Recent Labs: Potassium (mmol/L)  Date Value  06/25/2022 4.4   Magnesium (mg/dL)  Date Value  72/53/6644 1.9   Calcium (mg/dL)  Date Value  03/47/4259 8.9   Albumin (g/dL)  Date Value  56/38/7564 4.5  02/05/2021 4.9 (H)   Phosphorus (mg/dL)  Date Value  33/29/5188 3.7   Sodium (mmol/L)  Date Value  06/23/2022 139  02/05/2021 141   Assessment: 41 y/o F with medical history including EtOH use disorder / history of withdrawal, liver cirrhosis, history of cardiac arrest / Vfib secondary to prolonged QT after 1 dose of droperidol in 03/2022 who is admitted with intractable N/V. Pharmacy consulted to assist with electrolyte monitoring and replacement as indicated.  Goal of Therapy:  Electrolytes within normal limits Ideally maintain K > 4, Mg > 2 given her history of Vfib cardiac arrest / prolonged QT  Plan:  -- No electrolyte replacement at this time/labs discontinued by MD --f/u if labs ordered in am   Maribelle Hopple A 06/26/2022 12:22 PM

## 2022-06-28 ENCOUNTER — Encounter: Payer: Self-pay | Admitting: Gastroenterology

## 2022-06-28 ENCOUNTER — Ambulatory Visit: Payer: Medicaid Other | Admitting: Urology

## 2022-06-29 ENCOUNTER — Ambulatory Visit: Payer: Medicaid Other

## 2022-06-29 ENCOUNTER — Encounter: Payer: Self-pay | Admitting: Urology

## 2022-07-20 ENCOUNTER — Encounter: Payer: Self-pay | Admitting: Internal Medicine

## 2022-07-20 ENCOUNTER — Inpatient Hospital Stay: Payer: Medicaid Other

## 2022-07-20 ENCOUNTER — Inpatient Hospital Stay: Payer: Medicaid Other | Attending: Nurse Practitioner | Admitting: Internal Medicine

## 2022-07-20 VITALS — BP 113/80 | HR 105 | Temp 98.4°F | Resp 16 | Wt 112.8 lb

## 2022-07-20 DIAGNOSIS — D649 Anemia, unspecified: Secondary | ICD-10-CM | POA: Insufficient documentation

## 2022-07-20 DIAGNOSIS — E611 Iron deficiency: Secondary | ICD-10-CM | POA: Diagnosis not present

## 2022-07-20 DIAGNOSIS — K7030 Alcoholic cirrhosis of liver without ascites: Secondary | ICD-10-CM

## 2022-07-20 DIAGNOSIS — D708 Other neutropenia: Secondary | ICD-10-CM | POA: Diagnosis not present

## 2022-07-20 DIAGNOSIS — D696 Thrombocytopenia, unspecified: Secondary | ICD-10-CM

## 2022-07-20 DIAGNOSIS — N921 Excessive and frequent menstruation with irregular cycle: Secondary | ICD-10-CM | POA: Insufficient documentation

## 2022-07-20 DIAGNOSIS — D709 Neutropenia, unspecified: Secondary | ICD-10-CM | POA: Insufficient documentation

## 2022-07-20 DIAGNOSIS — Z79899 Other long term (current) drug therapy: Secondary | ICD-10-CM | POA: Diagnosis not present

## 2022-07-20 LAB — CBC WITH DIFFERENTIAL/PLATELET
Abs Immature Granulocytes: 0.01 10*3/uL (ref 0.00–0.07)
Basophils Absolute: 0 10*3/uL (ref 0.0–0.1)
Basophils Relative: 0 %
Eosinophils Absolute: 0 10*3/uL (ref 0.0–0.5)
Eosinophils Relative: 1 %
HCT: 32.2 % — ABNORMAL LOW (ref 36.0–46.0)
Hemoglobin: 10.2 g/dL — ABNORMAL LOW (ref 12.0–15.0)
Immature Granulocytes: 0 %
Lymphocytes Relative: 24 %
Lymphs Abs: 0.7 10*3/uL (ref 0.7–4.0)
MCH: 22.1 pg — ABNORMAL LOW (ref 26.0–34.0)
MCHC: 31.7 g/dL (ref 30.0–36.0)
MCV: 69.7 fL — ABNORMAL LOW (ref 80.0–100.0)
Monocytes Absolute: 0.3 10*3/uL (ref 0.1–1.0)
Monocytes Relative: 13 %
Neutro Abs: 1.7 10*3/uL (ref 1.7–7.7)
Neutrophils Relative %: 62 %
Platelets: 183 10*3/uL (ref 150–400)
RBC: 4.62 MIL/uL (ref 3.87–5.11)
RDW: 18 % — ABNORMAL HIGH (ref 11.5–15.5)
WBC: 2.7 10*3/uL — ABNORMAL LOW (ref 4.0–10.5)
nRBC: 0 % (ref 0.0–0.2)

## 2022-07-20 LAB — COMPREHENSIVE METABOLIC PANEL
ALT: 10 U/L (ref 0–44)
AST: 20 U/L (ref 15–41)
Albumin: 4.4 g/dL (ref 3.5–5.0)
Alkaline Phosphatase: 59 U/L (ref 38–126)
Anion gap: 11 (ref 5–15)
BUN: 9 mg/dL (ref 6–20)
CO2: 23 mmol/L (ref 22–32)
Calcium: 9.3 mg/dL (ref 8.9–10.3)
Chloride: 103 mmol/L (ref 98–111)
Creatinine, Ser: 0.63 mg/dL (ref 0.44–1.00)
GFR, Estimated: >60 mL/min (ref >60)
Glucose, Bld: 96 mg/dL (ref 70–99)
Potassium: 4 mmol/L (ref 3.5–5.1)
Sodium: 137 mmol/L (ref 135–145)
Total Bilirubin: 0.5 mg/dL (ref 0.3–1.2)
Total Protein: 7.8 g/dL (ref 6.5–8.1)

## 2022-07-20 LAB — IRON AND TIBC
Iron: 35 ug/dL (ref 28–170)
Saturation Ratios: 6 % — ABNORMAL LOW (ref 10.4–31.8)
TIBC: 573 ug/dL — ABNORMAL HIGH (ref 250–450)
UIBC: 538 ug/dL

## 2022-07-20 LAB — FERRITIN: Ferritin: 9 ng/mL — ABNORMAL LOW (ref 11–307)

## 2022-07-20 MED ORDER — FERROUS SULFATE 220 (44 FE) MG/5ML PO SOLN: 220.0000 mg | Freq: Every day | ORAL | 3 refills | Status: DC

## 2022-07-20 NOTE — Progress Notes (Signed)
[place] OFFICE PROGRESS NOTE  Patient Care Team: [facility], [facility] as PCP - General Fletcher Anon, Yvette Clause, MD as PCP - Cardiology (Cardiology)   SUMMARY OF ONCOLOGIC HISTORY:  # NOV 2015 WBC- 2.2/ANC- 1200/ALC-500; [name]; 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 Normal cytogenetics  # hx of SVT likely secondary to electrolyte disturbance. S/p evaluation with cardiology.  # OCT 2016- Bil indeterminate lung nodules-CT  In July 2017- Improving.   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  Alone.  A pleasant 42 year-old African-American female patient with above history of chronic mild leukopenia/neutropenia; history of alcoholism is here for follow-up.  Patient was recently evaluated in the hospital/admitted for nausea vomiting.  Symptoms have been proved.   Patient is currently complains of decrease in appetite and has appt with PCP tomorrow to discuss appetite stimulant.   Patient unfortunately continues to drink alcohol.  She also smokes.   No weight loss or night sweats.   She does complain of hot flashes-she is on Lupron.  She also complains of easy bruising.  Review of Systems  Constitutional:  Negative for chills, diaphoresis, fever, [name], hemoptysis, sputum production, [name], palpitations, orthopnea and leg swelling.  Gastrointestinal:  Negative for abdominal pain, blood in stool, constipation, diarrhea, heartburn, melena, nausea and vomiting.  Genitourinary:  Negative for dysuria, frequency and urgency.  Musculoskeletal:  Negative for back pain  and joint pain.  Skin: Negative.  Negative for itching and rash.  Neurological:  Negative for dizziness, tingling, focal weakness, weakness and headaches.  Endo/Heme/Allergies:  Does not bruise/bleed easily.  Psychiatric/Behavioral:  Negative for depression. The patient is not nervous/anxious and does not have insomnia.     PAST MEDICAL HISTORY :  Past Medical History:  Diagnosis Date   Asthma    Cardiac arrest ([place])    ETOH abuse    H/O Kawasaki's disease    as 37 month old child and at 19 years   History of anemia    History of blood transfusion 1984   Hypokalemia    Hypomagnesemia    a. in the setting of n/v->gastritis/etoh.   Leukopenia    Neutropenia (HCC)    NICM (nonischemic cardiomyopathy) ([place])    a. 06/2021 Echo: EF 50-55%, [name]; b. 09/2021 ETT: Ex time 6:19, [name], [name]; c. 03/2022 Echo: EF 35-40%, [name].   NSVT (nonsustained ventricular tachycardia) ([place])    a. Noted during admission 06/2021 assoc w/ palpitations and presyncope in the setting of profound QT prolongation/gastritis.   Tobacco abuse    Transaminitis     PAST SURGICAL HISTORY :   Past Surgical History:  Procedure Laterality Date   ESOPHAGOGASTRODUODENOSCOPY N/A 10/29/2020   Procedure: ESOPHAGOGASTRODUODENOSCOPY (EGD);  Surgeon: Jonathon Bellows, MD;  Location[facility] ENDOSCOPY;  Service: Gastroenterology;  [name];   ESOPHAGOGASTRODUODENOSCOPY (EGD) WITH PROPOFOL N/A 03/14/2020   Procedure: ESOPHAGOGASTRODUODENOSCOPY (EGD) WITH PROPOFOL;  Surgeon: Virgel Manifold, MD;  [name];  Service: Endoscopy;  [name];   ESOPHAGOGASTRODUODENOSCOPY (EGD) WITH PROPOFOL N/A 02/25/2021   Procedure: ESOPHAGOGASTRODUODENOSCOPY (EGD) WITH PROPOFOL;  Surgeon: Virgel Manifold, MD;  Location: [facility] ENDOSCOPY;  Service: Endoscopy;  [name];   ESOPHAGOGASTRODUODENOSCOPY (EGD) WITH PROPOFOL N/A 06/25/2022   Procedure:  ESOPHAGOGASTRODUODENOSCOPY (EGD) WITH PROPOFOL;  Surgeon: Annamaria Helling, DO;  Location: [place];  Service: Gastroenterology;  [name];    FAMILY HISTORY :   Family History  Problem Relation Age of Onset   Lupus Mother    Prostate cancer Father    Cancer Paternal Aunt        Breast    Cancer Paternal Aunt        Breast     SOCIAL HISTORY:   Social History   Tobacco Use   Smoking status: Every Day    Packs/day: 0.25    Years: 10.00    Total pack years: 2.50    Types: Cigarettes   Smokeless tobacco: Never   Tobacco comments:    05/27/22 3 cig per day   Vaping Use   Vaping Use: Never used  Substance Use Topics   Alcohol use: Yes    Alcohol/week: 61.0 standard drinks of alcohol    Types: 21 Cans of beer, [name]; 05/27/22 approx 2-3 beers week for the last 2 months   Drug use: No    ALLERGIES:  has No Known Allergies.  MEDICATIONS:  Current Outpatient Medications  Medication Sig Dispense Refill   DEPO-PROVERA 150 MG/ML injection Inject 150 mg into the muscle every 3 (three) months.     ferrous sulfate 220 (44 Fe) MG/5ML solution Take 5 mLs (220 mg total) by mouth daily. 150 mL 3   Multiple Vitamin (MULTIVITAMIN WITH MINERALS) TABS tablet Take 1 tablet by mouth daily. 30 tablet 0   pantoprazole (PROTONIX) 40 MG tablet Take 1 tablet (40 mg total) by mouth daily. 30 tablet 2   promethazine (PHENERGAN) 12.5 MG tablet Take 1 tablet (12.5 mg total) by mouth every 12 (twelve) hours as needed for nausea or vomiting. 10 tablet 0   folic acid (FOLVITE) 1 MG tablet Take 1 tablet (1 mg total) by mouth daily. (Patient not taking: Reported on 07/20/2022) 30 tablet 0   naltrexone (DEPADE) 50 MG tablet Take 50 mg by mouth daily. (Patient not taking: Reported on 07/20/2022)     nicotine (NICODERM CQ - DOSED IN MG/24 HOURS) 21 mg/24hr patch Place 1 patch (21 mg total) onto the skin daily as needed (nicotine craving).  (Patient not taking: Reported on 07/20/2022) 28 patch 0   thiamine (VITAMIN B-1) 100 MG tablet Take 1 tablet (100 mg total) by mouth daily. (Patient not taking: Reported on 07/20/2022) 30 tablet 0   No current facility-administered medications for this visit.    PHYSICAL EXAMINATION: ECOG PERFORMANCE STATUS: 0 - Asymptomatic  BP 113/80 (BP Location: Left Arm, Patient Position: Sitting)   Pulse (!) 105   Temp 98.4 F (36.9 C) (Tympanic)   Resp 16   Wt 112 lb 12.8 oz (51.2 kg)   BMI 21.31 kg/m   Filed Weights   07/20/22 1300  Weight: 112 lb 12.8 oz (51.2 kg)    Physical Exam HENT:     Head: Normocephalic and atraumatic.     Mouth/Throat:     Pharynx: No oropharyngeal exudate.  Eyes:     Pupils: Pupils are equal, round, and reactive to light.  Cardiovascular:     Rate and Rhythm: Normal rate and regular rhythm.  Pulmonary:     Effort: No respiratory distress.  Breath sounds: No wheezing.  Abdominal:     General: Bowel sounds are normal. There is no distension.     Palpations: Abdomen is soft. There is no mass.     Tenderness: There is no abdominal tenderness. There is no guarding or rebound.  Musculoskeletal:        General: No tenderness. Normal range of motion.     Cervical back: Normal range of motion and neck supple.  Skin:    General: Skin is warm.  Neurological:     Mental Status: She is alert and oriented to person, place, and time.  Psychiatric:        Mood and Affect: Affect normal.      LABORATORY DATA:  I have reviewed the data as listed    Component Value Date/Time   NA 137 07/20/2022 1306   NA 141 02/05/2021 1334   K 4.0 07/20/2022 1306   CL 103 07/20/2022 1306   CO2 23 07/20/2022 1306   GLUCOSE 96 07/20/2022 1306   BUN 9 07/20/2022 1306   BUN 7 02/05/2021 1334   CREATININE 0.63 07/20/2022 1306   CALCIUM 9.3 07/20/2022 1306   PROT 7.8 07/20/2022 1306   PROT 8.0 02/05/2021 1334   ALBUMIN 4.4 07/20/2022 1306   ALBUMIN 4.9 (H) 02/05/2021 1334    AST 20 07/20/2022 1306   ALT 10 07/20/2022 1306   ALKPHOS 59 07/20/2022 1306   BILITOT 0.5 07/20/2022 1306   BILITOT 0.2 02/05/2021 1334   GFRNONAA >60 07/20/2022 1306   GFRAA >60 11/15/2019 2054    No results found for: "SPEP", "UPEP"  Lab Results  Component Value Date   WBC 2.7 (L) 07/20/2022   NEUTROABS 1.7 07/20/2022   HGB 10.2 (L) 07/20/2022   HCT 32.2 (L) 07/20/2022   MCV 69.7 (L) 07/20/2022   PLT 183 07/20/2022      Chemistry      Component Value Date/Time   NA 137 07/20/2022 1306   NA 141 02/05/2021 1334   K 4.0 07/20/2022 1306   CL 103 07/20/2022 1306   CO2 23 07/20/2022 1306   BUN 9 07/20/2022 1306   BUN 7 02/05/2021 1334   CREATININE 0.63 07/20/2022 1306   GLU 76 01/01/2020 1447      Component Value Date/Time   CALCIUM 9.3 07/20/2022 1306   ALKPHOS 59 07/20/2022 1306   AST 20 07/20/2022 1306   ALT 10 07/20/2022 1306   BILITOT 0.5 07/20/2022 1306   BILITOT 0.2 02/05/2021 1334       RADIOGRAPHIC STUDIES: I have personally reviewed the radiological images as listed and agreed with the findings in the report. No results found.   ASSESSMENT & PLAN:  Other neutropenia (HCC) # Intermittent neutropenia white count  2-3 ANC [phone]; today absolute neutrophil count is  1.7  hemoglobin 10.2- platelets 183 Asymptomatic/benign ethnic neutropenia/? Alcohol.  Previous bone marrow biopsy negative.  # Mild Anemia-Hb-10.2 on Depot [menstrual cycles " all over the place"]- Iron sat 7%; Ferritin -34 [PCP]-patient not taking gentle iron.  [Hx of constipation; difficulty swallowing]- will send liquid Iron script.   # hx of SVT- ? Sec to electrolyte abnormalites [s/p holter]-follow-up with cardiology.  # Heavy alcohol-again discussed cessation patient not interested.  # DISPOSITION: # follow up in 4 months- MD; -labs-cbc/cmp/ldh; iron studies;ferritin--Dr.B  Cc; Risk manager.       Earna Coder, MD 07/22/2022 8:36 AM

## 2022-07-20 NOTE — Assessment & Plan Note (Addendum)
#   Intermittent neutropenia white count  2-3 ANC [phone]; today absolute neutrophil count is  1.7  hemoglobin 10.2- platelets 183 Asymptomatic/benign ethnic neutropenia/? Alcohol.  Previous bone marrow biopsy negative.  # Mild Anemia-Hb-10.2 on Depot [menstrual cycles " all over the place"]- Iron sat 7%; Ferritin -34 [PCP]-patient not taking gentle iron.  [Hx of constipation; difficulty swallowing]- will send liquid Iron script.   # hx of SVT- ? Sec to electrolyte abnormalites [s/p holter]-follow-up with cardiology.  # Heavy alcohol-again discussed cessation patient not interested.  # DISPOSITION: # follow up in 4 months- MD; -labs-cbc/cmp/ldh; iron studies;ferritin--Dr.B  Cc; Therapist, nutritional.   Addendum: add possible venofer at next visit.   Latest Reference Range & Units 07/20/22 13:06  Iron 28 - 170 ug/dL 35  UIBC ug/dL 538  TIBC 250 - 450 ug/dL 573 (H)  Saturation Ratios 10.4 - 31.8 % 6 (L)  Ferritin 11 - 307 ng/mL 9 (L)  (H): Data is abnormally high (L): Data is abnormally low

## 2022-07-20 NOTE — Progress Notes (Signed)
6 day hospital admission in 06/2022 with work up for nausea and vomiting.  Symptoms have now improved.    Decrease in appetite and has appt with PCP tomorrow to discuss appetite stimulant.

## 2022-07-21 ENCOUNTER — Ambulatory Visit: Payer: Medicaid Other | Admitting: Oncology

## 2022-07-21 ENCOUNTER — Other Ambulatory Visit: Payer: Medicaid Other

## 2022-07-22 ENCOUNTER — Encounter: Payer: Self-pay | Admitting: Internal Medicine

## 2022-07-22 ENCOUNTER — Telehealth: Payer: Self-pay | Admitting: Internal Medicine

## 2022-07-22 DIAGNOSIS — E611 Iron deficiency: Secondary | ICD-10-CM | POA: Insufficient documentation

## 2022-07-22 NOTE — Telephone Encounter (Signed)
Please inform patient that she needs to be compliant with her iron supplements-as her iron levels are low.  Also please inform that I would consider possible IV iron infusion at next visit based upon her labs.    Please schedule for posisble Venofer-at next visit.

## 2022-07-22 NOTE — Telephone Encounter (Signed)
Attempt to notify patient but no answer and mailbox is full.

## 2022-07-26 NOTE — Telephone Encounter (Signed)
Unsuccessful attempt at calling patient.

## 2022-07-27 NOTE — Telephone Encounter (Signed)
Patient notified

## 2022-07-30 ENCOUNTER — Other Ambulatory Visit: Payer: Self-pay

## 2022-07-30 ENCOUNTER — Encounter: Payer: Self-pay | Admitting: Nurse Practitioner

## 2022-07-30 ENCOUNTER — Emergency Department
Admission: EM | Admit: 2022-07-30 | Discharge: 2022-07-30 | Disposition: A | Discharge status: Home / Self Care | Payer: Medicaid Other | Attending: Emergency Medicine | Admitting: Emergency Medicine

## 2022-07-30 DIAGNOSIS — R1115 Cyclical vomiting syndrome unrelated to migraine: Secondary | ICD-10-CM | POA: Diagnosis not present

## 2022-07-30 DIAGNOSIS — R112 Nausea with vomiting, unspecified: Secondary | ICD-10-CM | POA: Diagnosis present

## 2022-07-30 LAB — CBC WITH DIFFERENTIAL/PLATELET
Abs Immature Granulocytes: 0.03 10*3/uL (ref 0.00–0.07)
Basophils Absolute: 0 10*3/uL (ref 0.0–0.1)
Basophils Relative: 0 %
Eosinophils Absolute: 0 10*3/uL (ref 0.0–0.5)
Eosinophils Relative: 0 %
HCT: 35.5 % — ABNORMAL LOW (ref 36.0–46.0)
Hemoglobin: 10.9 g/dL — ABNORMAL LOW (ref 12.0–15.0)
Immature Granulocytes: 0 %
Lymphocytes Relative: 3 %
Lymphs Abs: 0.2 10*3/uL — ABNORMAL LOW (ref 0.7–4.0)
MCH: 21.8 pg — ABNORMAL LOW (ref 26.0–34.0)
MCHC: 30.7 g/dL (ref 30.0–36.0)
MCV: 71 fL — ABNORMAL LOW (ref 80.0–100.0)
Monocytes Absolute: 0.2 10*3/uL (ref 0.1–1.0)
Monocytes Relative: 2 %
Neutro Abs: 6.9 10*3/uL (ref 1.7–7.7)
Neutrophils Relative %: 95 %
Platelets: 326 10*3/uL (ref 150–400)
RBC: 5 MIL/uL (ref 3.87–5.11)
RDW: 18.7 % — ABNORMAL HIGH (ref 11.5–15.5)
WBC: 7.4 10*3/uL (ref 4.0–10.5)
nRBC: 0 % (ref 0.0–0.2)

## 2022-07-30 LAB — COMPREHENSIVE METABOLIC PANEL
ALT: 14 U/L (ref 0–44)
AST: 27 U/L (ref 15–41)
Albumin: 4.8 g/dL (ref 3.5–5.0)
Alkaline Phosphatase: 68 U/L (ref 38–126)
Anion gap: 14 (ref 5–15)
BUN: 11 mg/dL (ref 6–20)
CO2: 21 mmol/L — ABNORMAL LOW (ref 22–32)
Calcium: 9.9 mg/dL (ref 8.9–10.3)
Chloride: 103 mmol/L (ref 98–111)
Creatinine, Ser: 0.81 mg/dL (ref 0.44–1.00)
GFR, Estimated: >60 mL/min (ref >60)
Glucose, Bld: 156 mg/dL — ABNORMAL HIGH (ref 70–99)
Potassium: 3.5 mmol/L (ref 3.5–5.1)
Sodium: 138 mmol/L (ref 135–145)
Total Bilirubin: 0.9 mg/dL (ref 0.3–1.2)
Total Protein: 9.3 g/dL — ABNORMAL HIGH (ref 6.5–8.1)

## 2022-07-30 LAB — LIPASE, BLOOD: Lipase: 24 U/L (ref 11–51)

## 2022-07-30 MED ORDER — FAMOTIDINE IN NACL 20-0.9 MG/50ML-% IV SOLN
20.0000 mg | Freq: Once | INTRAVENOUS | Status: AC
  Administered 2022-07-30: 20 mg via INTRAVENOUS
  Filled 2022-07-30: qty 50

## 2022-07-30 MED ORDER — LACTATED RINGERS IV BOLUS
1000.0000 mL | Freq: Once | INTRAVENOUS | Status: AC
  Administered 2022-07-30: 1000 mL via INTRAVENOUS

## 2022-07-30 MED ORDER — METOCLOPRAMIDE HCL 10 MG PO TABS: 10.0000 mg | ORAL_TABLET | Freq: Three times a day (TID) | ORAL | 1 refills | Status: DC

## 2022-07-30 MED ORDER — METOCLOPRAMIDE HCL 5 MG/ML IJ SOLN
10.0000 mg | Freq: Once | INTRAMUSCULAR | Status: AC
  Administered 2022-07-30: 10 mg via INTRAVENOUS
  Filled 2022-07-30: qty 2

## 2022-07-30 MED ORDER — PROMETHAZINE HCL 25 MG/ML IJ SOLN
25.0000 mg | Freq: Once | INTRAMUSCULAR | Status: AC
  Administered 2022-07-30: 25 mg via INTRAMUSCULAR
  Filled 2022-07-30: qty 1

## 2022-07-30 MED ORDER — MIDAZOLAM HCL 2 MG/2ML IJ SOLN
2.0000 mg | Freq: Once | INTRAMUSCULAR | Status: AC
  Administered 2022-07-30: 2 mg via INTRAVENOUS
  Filled 2022-07-30: qty 2

## 2022-07-30 MED ORDER — PROMETHAZINE HCL 50 MG RE SUPP: 50.0000 mg | Freq: Four times a day (QID) | RECTAL | 0 refills | Status: DC | PRN

## 2022-07-30 NOTE — ED Provider Notes (Signed)
[facility] Provider Note    Event Date/Time   First MD Initiated Contact with Patient 07/30/22 [phone]     (approximate)   History   Emesis   HPI  Yvette [name] is a 42 y.o. female who presents to the ED for evaluation of Emesis   I reviewed medical DC summary from 12/16.  She was admitted for intractable nausea and vomiting, dehydration.  Essentially normal EGD during this admission.  She has a history of alcohol abuse, cirrhosis.  History of cardiac arrest, V-fib arrest after droperidol and prolonged Qtc, that occurred in September 2023.  Patient presents to the ED for evaluation of about 9 or 10 hours of emesis.  Denies any abdominal pain, diarrhea, fever, dysuria.   Reports that this happens frequently for her.  She reports running out of her Phenergan suppositories , which has worsened her symptoms.   Physical Exam   Triage Vital Signs: ED Triage Vitals  Enc Vitals Group     BP 07/30/22 0409 (!) 139/94     Pulse Rate 07/30/22 0409 75     Resp 07/30/22 0409 18     Temp 07/30/22 0409 97.7 F (36.5 C)     Temp src --      SpO2 07/30/22 0409 99 %     Weight --      Height --      Head Circumference --      Peak Flow --      Pain Score 07/30/22 0410 0     Pain Loc --      Pain Edu? --      Excl. in [place]? --     Most recent vital signs: Vitals:   07/30/22 0600 07/30/22 0630  BP: (!) 144/84 137/73  Pulse: 74 66  Resp: 19 17  Temp:    SpO2: 100% 100%    General: Awake, no distress.  Sitting upright in bed with emesis bag in front of her. CV:  Good peripheral perfusion.  Resp:  Normal effort.  Abd:  No distention.  Soft and benign throughout.  No tenderness. MSK:  No deformity noted.  Neuro:  No focal deficits appreciated. Other:     ED Results / Procedures / Treatments   Labs (all labs ordered are listed, but only abnormal results are displayed) Labs Reviewed  CBC WITH DIFFERENTIAL/PLATELET - Abnormal; Notable for the  following components:      Result Value   Hemoglobin 10.9 (*)    HCT 35.5 (*)    MCV 71.0 (*)    MCH 21.8 (*)    RDW 18.7 (*)    Lymphs Abs 0.2 (*)    All other components within normal limits  COMPREHENSIVE METABOLIC PANEL - Abnormal; Notable for the following components:   CO2 21 (*)    Glucose, Bld 156 (*)    Total Protein 9.3 (*)    All other components within normal limits  LIPASE, BLOOD  URINALYSIS, ROUTINE W REFLEX MICROSCOPIC  POC URINE PREG, ED    EKG Sinus rhythm with a rate of 85 bpm.  Normal axis.  QTc 496.  No ischemic features.  RADIOLOGY   Official radiology report(s): No results found.  PROCEDURES and INTERVENTIONS:  Procedures  Medications  lactated ringers bolus 1,000 mL (0 mLs Intravenous Stopped 07/30/22 0546)  metoCLOPramide (REGLAN) injection 10 mg (10 mg Intravenous Given 07/30/22 0447)  midazolam (VERSED) injection 2 mg (2 mg Intravenous Given 07/30/22 0447)  promethazine (PHENERGAN)  injection 25 mg (25 mg Intramuscular Given 07/30/22 0547)     IMPRESSION / MDM / ASSESSMENT AND PLAN / ED COURSE  I reviewed the triage vital signs and the nursing notes.  Differential diagnosis includes, but is not limited to, cyclic vomiting, gastroenteritis, pancreatitis  {Patient presents with symptoms of an acute illness or injury that is potentially life-threatening.  42 year old female with extensive history of recurrent emesis presents with another episode after running out of her Phenergan suppositories.  She looks well and has no abdominal tenderness or pain.  Reassuring vital signs and blood work.  No leukocytosis or signs of sepsis.  Normal lipase and metabolic panel.  No waiting on urine sample.  We will hold QT prolonging agents considering her history of V-fib arrest in the past.  Of note, nursing staff found the patient multiple times with fingers down her throat try to make her throw up because "she thought would make her feel better."   She looks  well to me I suspect she will be suitable for outpatient management rehydration and antiemetics.  I wrote for Phenergan suppositories and Reglan.  Clinical Course as of 07/30/22 0655  Fri Jul 30, 2022  0506 Reassessed. Feeling better, resting back and no longer clutching emesis bag [DS]  3244 Reassessed.  Tolerating some sips of ginger ale [DS]    Clinical Course User Index [DS] Vladimir Crofts, MD     FINAL CLINICAL IMPRESSION(S) / ED DIAGNOSES   Final diagnoses:  Cyclic vomiting syndrome     Rx / DC Orders   ED Discharge Orders          Ordered    metoCLOPramide (REGLAN) 10 MG tablet  3 times daily with meals        07/30/22 0654    promethazine (PROMETHEGAN) 50 MG suppository  Every 6 hours PRN        07/30/22 0654             Note:  This document was prepared using Dragon voice recognition software and may include unintentional dictation errors.   Vladimir Crofts, MD 07/30/22 [phone]

## 2022-07-30 NOTE — ED Notes (Signed)
Pt is shoving her finger down her throat, sts she can feel the acid in her stomach and making herself vomit will make her "feel better"

## 2022-07-30 NOTE — ED Triage Notes (Signed)
Pt sts she has been vomiting for the last 9 hrs. Sts that it was feeling worse over the last 2 hrs. Pt sts "I have gastrointestinal disease" sts she ran out of her phenergan yesterday. Last gastro appointment was a month ago when she had an EGD. No active vomiting at this time.

## 2022-07-30 NOTE — ED Notes (Signed)
Pt placed on continuous pulse ox

## 2022-07-30 NOTE — ED Notes (Signed)
Pt given ginger ale for a PO challenge.

## 2022-07-30 NOTE — ED Notes (Signed)
Patient given ginger ale to attempt PO trial at this time. Patient took drink of ginger ale and began spitting into emesis bag.

## 2022-07-30 NOTE — ED Notes (Signed)
ED Provider at bedside. 

## 2022-07-30 NOTE — ED Notes (Signed)
This RN attempted IV x1 and was unsuccessful. Charge RN [employer] notified of difficulty. Sts he will assess pt.

## 2022-08-01 ENCOUNTER — Observation Stay
Admission: EM | Admit: 2022-08-01 | Discharge: 2022-08-03 | Disposition: A | Discharge status: Home / Self Care | Payer: Medicaid Other | Attending: Student | Admitting: Student

## 2022-08-01 DIAGNOSIS — K709 Alcoholic liver disease, unspecified: Secondary | ICD-10-CM | POA: Diagnosis present

## 2022-08-01 DIAGNOSIS — R1110 Vomiting, unspecified: Secondary | ICD-10-CM | POA: Diagnosis present

## 2022-08-01 DIAGNOSIS — E876 Hypokalemia: Secondary | ICD-10-CM | POA: Diagnosis not present

## 2022-08-01 DIAGNOSIS — F17210 Nicotine dependence, cigarettes, uncomplicated: Secondary | ICD-10-CM | POA: Insufficient documentation

## 2022-08-01 DIAGNOSIS — Z79899 Other long term (current) drug therapy: Secondary | ICD-10-CM | POA: Insufficient documentation

## 2022-08-01 DIAGNOSIS — R112 Nausea with vomiting, unspecified: Secondary | ICD-10-CM | POA: Diagnosis not present

## 2022-08-01 DIAGNOSIS — J45909 Unspecified asthma, uncomplicated: Secondary | ICD-10-CM | POA: Insufficient documentation

## 2022-08-01 DIAGNOSIS — I10 Essential (primary) hypertension: Secondary | ICD-10-CM | POA: Insufficient documentation

## 2022-08-01 DIAGNOSIS — Z8674 Personal history of sudden cardiac arrest: Secondary | ICD-10-CM

## 2022-08-01 DIAGNOSIS — K766 Portal hypertension: Secondary | ICD-10-CM | POA: Diagnosis present

## 2022-08-01 DIAGNOSIS — R9431 Abnormal electrocardiogram [ECG] [EKG]: Secondary | ICD-10-CM

## 2022-08-01 DIAGNOSIS — F1090 Alcohol use, unspecified, uncomplicated: Secondary | ICD-10-CM | POA: Insufficient documentation

## 2022-08-01 LAB — COMPREHENSIVE METABOLIC PANEL
ALT: 100 U/L — ABNORMAL HIGH (ref 0–44)
AST: 105 U/L — ABNORMAL HIGH (ref 15–41)
Albumin: 4.7 g/dL (ref 3.5–5.0)
Alkaline Phosphatase: 59 U/L (ref 38–126)
Anion gap: 17 — ABNORMAL HIGH (ref 5–15)
BUN: 13 mg/dL (ref 6–20)
CO2: 22 mmol/L (ref 22–32)
Calcium: 9.5 mg/dL (ref 8.9–10.3)
Chloride: 97 mmol/L — ABNORMAL LOW (ref 98–111)
Creatinine, Ser: 0.75 mg/dL (ref 0.44–1.00)
GFR, Estimated: >60 mL/min (ref >60)
Glucose, Bld: 85 mg/dL (ref 70–99)
Potassium: 2.5 mmol/L — CL (ref 3.5–5.1)
Sodium: 136 mmol/L (ref 135–145)
Total Bilirubin: 0.9 mg/dL (ref 0.3–1.2)
Total Protein: 8.6 g/dL — ABNORMAL HIGH (ref 6.5–8.1)

## 2022-08-01 LAB — POC URINE PREG, ED: Preg Test, Ur: NEGATIVE

## 2022-08-01 LAB — CBC
HCT: 35.2 % — ABNORMAL LOW (ref 36.0–46.0)
Hemoglobin: 11 g/dL — ABNORMAL LOW (ref 12.0–15.0)
MCH: 21.8 pg — ABNORMAL LOW (ref 26.0–34.0)
MCHC: 31.3 g/dL (ref 30.0–36.0)
MCV: 69.8 fL — ABNORMAL LOW (ref 80.0–100.0)
Platelets: 253 10*3/uL (ref 150–400)
RBC: 5.04 MIL/uL (ref 3.87–5.11)
RDW: 18.6 % — ABNORMAL HIGH (ref 11.5–15.5)
WBC: 5.5 10*3/uL (ref 4.0–10.5)
nRBC: 0 % (ref 0.0–0.2)

## 2022-08-01 LAB — LIPASE, BLOOD: Lipase: 27 U/L (ref 11–51)

## 2022-08-01 NOTE — ED Notes (Signed)
Pt notified of need for urine sample. Pt given a urine cup and up to the bathroom to attempt to provide urine sample at this time. Pt provided with warm blankets at this time.

## 2022-08-01 NOTE — ED Notes (Signed)
Pt with critical K+ level of 2.5, EDP notified. Pt to be taken to next available bed.

## 2022-08-01 NOTE — ED Triage Notes (Signed)
Pt sts that she has been vomiting and having abd pain since Wednesday. Pt was seen here recently for it also and was told to follow up with GI but she can get an apt til February.

## 2022-08-01 NOTE — ED Notes (Signed)
RN and ED Tech attempted to collect labs. RN called phlebotomy to come and collect due to not being able to collect.

## 2022-08-01 NOTE — ED Notes (Signed)
First Nurse Note: Pt arrives via [facility] from home with complaints of N/V x 4 days. Was seen here for same earlier last week - has been compliant with prescribed medications. Cannot take Zofran due to prolong QT.  VS with EMS  CBG-110 167/90 54 HR 100% RA

## 2022-08-02 ENCOUNTER — Encounter: Payer: Self-pay | Admitting: Internal Medicine

## 2022-08-02 ENCOUNTER — Other Ambulatory Visit: Payer: Self-pay

## 2022-08-02 DIAGNOSIS — R1110 Vomiting, unspecified: Secondary | ICD-10-CM | POA: Diagnosis not present

## 2022-08-02 DIAGNOSIS — Z8674 Personal history of sudden cardiac arrest: Secondary | ICD-10-CM

## 2022-08-02 DIAGNOSIS — R9431 Abnormal electrocardiogram [ECG] [EKG]: Secondary | ICD-10-CM

## 2022-08-02 DIAGNOSIS — F1090 Alcohol use, unspecified, uncomplicated: Secondary | ICD-10-CM | POA: Insufficient documentation

## 2022-08-02 LAB — URINALYSIS, ROUTINE W REFLEX MICROSCOPIC
Bilirubin Urine: NEGATIVE
Glucose, UA: NEGATIVE mg/dL
Ketones, ur: 20 mg/dL — AB
Leukocytes,Ua: NEGATIVE
Nitrite: NEGATIVE
Protein, ur: 100 mg/dL — AB
Specific Gravity, Urine: 1.03 (ref 1.005–1.030)
pH: 6 (ref 5.0–8.0)

## 2022-08-02 LAB — URINE DRUG SCREEN, QUALITATIVE (ARMC ONLY)
Amphetamines, Ur Screen: NOT DETECTED
Barbiturates, Ur Screen: NOT DETECTED
Benzodiazepine, Ur Scrn: POSITIVE — AB
Cannabinoid 50 Ng, Ur ~~LOC~~: NOT DETECTED
Cocaine Metabolite,Ur ~~LOC~~: NOT DETECTED
MDMA (Ecstasy)Ur Screen: NOT DETECTED
Methadone Scn, Ur: NOT DETECTED
Opiate, Ur Screen: NOT DETECTED
Phencyclidine (PCP) Ur S: NOT DETECTED
Tricyclic, Ur Screen: POSITIVE — AB

## 2022-08-02 LAB — BASIC METABOLIC PANEL
Anion gap: 12 (ref 5–15)
BUN: 12 mg/dL (ref 6–20)
CO2: 24 mmol/L (ref 22–32)
Calcium: 9 mg/dL (ref 8.9–10.3)
Chloride: 100 mmol/L (ref 98–111)
Creatinine, Ser: 0.68 mg/dL (ref 0.44–1.00)
GFR, Estimated: >60 mL/min (ref >60)
Glucose, Bld: 85 mg/dL (ref 70–99)
Potassium: 3.1 mmol/L — ABNORMAL LOW (ref 3.5–5.1)
Sodium: 136 mmol/L (ref 135–145)

## 2022-08-02 LAB — CBC
HCT: 31.4 % — ABNORMAL LOW (ref 36.0–46.0)
Hemoglobin: 9.8 g/dL — ABNORMAL LOW (ref 12.0–15.0)
MCH: 21.8 pg — ABNORMAL LOW (ref 26.0–34.0)
MCHC: 31.2 g/dL (ref 30.0–36.0)
MCV: 69.9 fL — ABNORMAL LOW (ref 80.0–100.0)
Platelets: 211 10*3/uL (ref 150–400)
RBC: 4.49 MIL/uL (ref 3.87–5.11)
RDW: 18.6 % — ABNORMAL HIGH (ref 11.5–15.5)
WBC: 5 10*3/uL (ref 4.0–10.5)
nRBC: 0 % (ref 0.0–0.2)

## 2022-08-02 LAB — HEPATIC FUNCTION PANEL
ALT: 75 U/L — ABNORMAL HIGH (ref 0–44)
AST: 64 U/L — ABNORMAL HIGH (ref 15–41)
Albumin: 3.9 g/dL (ref 3.5–5.0)
Alkaline Phosphatase: 52 U/L (ref 38–126)
Bilirubin, Direct: 0.2 mg/dL (ref 0.0–0.2)
Indirect Bilirubin: 0.5 mg/dL (ref 0.3–0.9)
Total Bilirubin: 0.7 mg/dL (ref 0.3–1.2)
Total Protein: 7.2 g/dL (ref 6.5–8.1)

## 2022-08-02 LAB — POC URINE PREG, ED: Preg Test, Ur: NEGATIVE

## 2022-08-02 LAB — MAGNESIUM
Magnesium: 2.3 mg/dL (ref 1.7–2.4)
Magnesium: 2 mg/dL (ref 1.7–2.4)

## 2022-08-02 LAB — PHOSPHORUS: Phosphorus: 4 mg/dL (ref 2.5–4.6)

## 2022-08-02 MED ORDER — ACETAMINOPHEN 650 MG RE SUPP: 650.0000 mg | Freq: Four times a day (QID) | RECTAL | Status: DC | PRN

## 2022-08-02 MED ORDER — ADULT MULTIVITAMIN W/MINERALS CH
1.0000 | ORAL_TABLET | Freq: Every day | ORAL | Status: DC
  Administered 2022-08-02 – 2022-08-03 (×2): 1 via ORAL
  Filled 2022-08-02 (×2): qty 1

## 2022-08-02 MED ORDER — LORAZEPAM 2 MG/ML IJ SOLN: 1.0000 mg | INTRAMUSCULAR | Status: DC | PRN

## 2022-08-02 MED ORDER — FOLIC ACID 1 MG PO TABS
1.0000 mg | ORAL_TABLET | Freq: Every day | ORAL | Status: DC
  Administered 2022-08-02 – 2022-08-03 (×2): 1 mg via ORAL
  Filled 2022-08-02 (×2): qty 1

## 2022-08-02 MED ORDER — PROCHLORPERAZINE EDISYLATE 10 MG/2ML IJ SOLN
10.0000 mg | INTRAMUSCULAR | Status: DC | PRN
  Administered 2022-08-02 (×3): 10 mg via INTRAVENOUS
  Filled 2022-08-02 (×4): qty 2

## 2022-08-02 MED ORDER — ACETAMINOPHEN 325 MG PO TABS: 650.0000 mg | ORAL_TABLET | Freq: Four times a day (QID) | ORAL | Status: DC | PRN

## 2022-08-02 MED ORDER — ENOXAPARIN SODIUM 40 MG/0.4ML IJ SOSY
40.0000 mg | PREFILLED_SYRINGE | INTRAMUSCULAR | Status: DC
  Administered 2022-08-02 – 2022-08-03 (×2): 40 mg via SUBCUTANEOUS
  Filled 2022-08-02 (×2): qty 0.4

## 2022-08-02 MED ORDER — THIAMINE HCL 100 MG/ML IJ SOLN
100.0000 mg | Freq: Every day | INTRAMUSCULAR | Status: DC
  Administered 2022-08-02: 100 mg via INTRAVENOUS
  Filled 2022-08-02 (×2): qty 2

## 2022-08-02 MED ORDER — METOCLOPRAMIDE HCL 5 MG/ML IJ SOLN
10.0000 mg | Freq: Once | INTRAMUSCULAR | Status: AC
  Administered 2022-08-02: 10 mg via INTRAVENOUS
  Filled 2022-08-02: qty 2

## 2022-08-02 MED ORDER — LACTATED RINGERS IV SOLN: INTRAVENOUS | Status: DC

## 2022-08-02 MED ORDER — MORPHINE SULFATE (PF) 2 MG/ML IV SOLN: 2.0000 mg | INTRAVENOUS | Status: DC | PRN

## 2022-08-02 MED ORDER — POTASSIUM CHLORIDE 10 MEQ/100ML IV SOLN
10.0000 meq | Freq: Once | INTRAVENOUS | Status: AC
  Administered 2022-08-02: 10 meq via INTRAVENOUS
  Filled 2022-08-02: qty 100

## 2022-08-02 MED ORDER — LORAZEPAM 1 MG PO TABS: 1.0000 mg | ORAL_TABLET | ORAL | Status: DC | PRN

## 2022-08-02 MED ORDER — NICOTINE 14 MG/24HR TD PT24
14.0000 mg | MEDICATED_PATCH | Freq: Every day | TRANSDERMAL | Status: DC
  Filled 2022-08-02 (×2): qty 1

## 2022-08-02 MED ORDER — THIAMINE MONONITRATE 100 MG PO TABS
100.0000 mg | ORAL_TABLET | Freq: Every day | ORAL | Status: DC
  Administered 2022-08-03: 100 mg via ORAL
  Filled 2022-08-02: qty 1

## 2022-08-02 MED ORDER — POTASSIUM CHLORIDE 10 MEQ/100ML IV SOLN
10.0000 meq | INTRAVENOUS | Status: AC
  Administered 2022-08-02 (×4): 10 meq via INTRAVENOUS
  Filled 2022-08-02 (×4): qty 100

## 2022-08-02 MED ORDER — ALPRAZOLAM 0.5 MG PO TABS
0.5000 mg | ORAL_TABLET | Freq: Three times a day (TID) | ORAL | Status: DC | PRN
  Administered 2022-08-02: 0.5 mg via ORAL
  Filled 2022-08-02: qty 1

## 2022-08-02 MED ORDER — LACTATED RINGERS IV BOLUS
1000.0000 mL | Freq: Once | INTRAVENOUS | Status: AC
  Administered 2022-08-02: 1000 mL via INTRAVENOUS

## 2022-08-02 NOTE — Assessment & Plan Note (Addendum)
Portal hypertension Transaminitis Elevated AST and ALT likely secondary to vomiting To monitor for improvement with hydration

## 2022-08-02 NOTE — H&P (Signed)
History and Physical    Patient: Yvette [name] RXV:[id number] DOB: 21-Sep-1980 DOA: 08/01/2022 DOS: the patient was seen and examined on 08/02/2022 PCP: [facility]  Patient coming from: Home  Chief Complaint:  Chief Complaint  Patient presents with   Vomiting    HPI: Yvette [name] is a 42 y.o. female with medical history significant for alcohol use disorder., liver cirrhosis, recurrent ED visits and hospitalizations for intractable vomiting, most recently December 2023, QT prolongation with history of V-fib arrest 03/2022 after droperidol for emesis, who presents emergency department for chief concerns of intractable nausea and vomiting.  Patient was seen 2 days prior on 1/19, treated and discharged with Phenergan suppositories but vomiting which could when she ran out of suppositories.  She denies fever or chills abdominal pain.  Vomiting is bilious, nonbloody and without coffee grounds.  Denies diarrhea denies blood in stool or melanotic stool.  During her last hospitalization She had an upper endoscopy that was unremarkable. ED course and data review: Vitals unremarkable.  Labs significant for potassium of 2.5.  Mild transaminitis with AST of 105 and ALT 100.  Lipase normal at 27 WBC normal.  EKG, personally viewed and interpreted unremarkable and with normal QTc.  Patient was treated with Reglan, LR bolus and started on IV potassium and hospitalist consulted for admission   Review of Systems: As mentioned in the history of present illness. All other systems reviewed and are negative.  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 in the setting of n/v->gastritis/etoh.   Leukopenia    Neutropenia (HCC)    NICM (nonischemic cardiomyopathy) ([place])    a. 06/2021  Echo: EF 50-55%, [name]; b. 09/2021 ETT: Ex time 6:19, [name], [name]; c. 03/2022 Echo: EF 35-40%, [name].   NSVT (nonsustained ventricular tachycardia) ([place])    a. Noted during admission 06/2021 assoc w/ palpitations and presyncope in the setting of profound QT prolongation/gastritis.   Tobacco abuse    Transaminitis    Past Surgical History:  Procedure Laterality Date   ESOPHAGOGASTRODUODENOSCOPY N/A 10/29/2020   Procedure: ESOPHAGOGASTRODUODENOSCOPY (EGD);  Surgeon: Jonathon Bellows, MD;  Location: [facility] ENDOSCOPY;  Service: Gastroenterology;  [name];   ESOPHAGOGASTRODUODENOSCOPY (EGD) WITH PROPOFOL N/A 03/14/2020   Procedure: ESOPHAGOGASTRODUODENOSCOPY (EGD) WITH PROPOFOL;  Surgeon: Virgel Manifold, MD;  [name];  Service: Endoscopy;  [name];   ESOPHAGOGASTRODUODENOSCOPY (EGD) WITH PROPOFOL N/A 02/25/2021   Procedure: ESOPHAGOGASTRODUODENOSCOPY (EGD) WITH PROPOFOL;  Surgeon: Virgel Manifold, MD;  [name];  Service: Endoscopy;  [name];   ESOPHAGOGASTRODUODENOSCOPY (EGD) WITH PROPOFOL N/A 06/25/2022   Procedure: ESOPHAGOGASTRODUODENOSCOPY (EGD) WITH PROPOFOL;  Surgeon: Annamaria Helling, DO;  Location: [place];  Service: Gastroenterology;  [name];   Social History:  reports that she has been smoking cigarettes. She has a 2.50 pack-year smoking history. She has never used smokeless tobacco. She reports current alcohol use of about 61.0 standard drinks of alcohol per week. She reports that she does not use drugs.  Allergies  Allergen Reactions   Inapsine [Droperidol] Anaphylaxis   Zofran [Ondansetron] Other (See Comments)    Prolonged QT  Family History  Problem Relation Age of Onset   Lupus Mother    Prostate cancer Father    Cancer Paternal Aunt        Breast    Cancer Paternal Aunt        Breast     Prior to Admission medications   Medication Sig Start  Date End Date Taking? Authorizing Provider  DEPO-PROVERA 150 MG/ML injection Inject 150 mg into the muscle every 3 (three) months. 03/22/22   [provider]  ferrous sulfate 220 (44 Fe) MG/5ML solution Take 5 mLs (220 mg total) by mouth daily. 07/20/22   Cammie Sickle, MD  folic acid (FOLVITE) 1 MG tablet Take 1 tablet (1 mg total) by mouth daily. Patient not taking: Reported on 07/20/2022 06/27/22   Fritzi Mandes, MD  metoCLOPramide (REGLAN) 10 MG tablet Take 1 tablet (10 mg total) by mouth 3 (three) times daily with meals. 07/30/22 07/30/23  Vladimir Crofts, MD  Multiple Vitamin (MULTIVITAMIN WITH MINERALS) TABS tablet Take 1 tablet by mouth daily. 06/27/22   Fritzi Mandes, MD  naltrexone (DEPADE) 50 MG tablet Take 50 mg by mouth daily. Patient not taking: Reported on 07/20/2022 04/13/22   [provider]  nicotine (NICODERM CQ - DOSED IN MG/24 HOURS) 21 mg/24hr patch Place 1 patch (21 mg total) onto the skin daily as needed (nicotine craving). Patient not taking: Reported on 07/20/2022 06/26/22   Fritzi Mandes, MD  pantoprazole (PROTONIX) 40 MG tablet Take 1 tablet (40 mg total) by mouth daily. 06/26/22   Fritzi Mandes, MD  promethazine (PHENERGAN) 12.5 MG tablet Take 1 tablet (12.5 mg total) by mouth every 12 (twelve) hours as needed for nausea or vomiting. 06/26/22   Fritzi Mandes, MD  promethazine (PROMETHEGAN) 50 MG suppository Place 1 suppository (50 mg total) rectally every 6 (six) hours as needed for nausea or vomiting. 07/30/22   Vladimir Crofts, MD  thiamine (VITAMIN B-1) 100 MG tablet Take 1 tablet (100 mg total) by mouth daily. Patient not taking: Reported on 07/20/2022 06/27/22   Fritzi Mandes, MD    Physical Exam: Vitals:   08/01/22 2218 08/01/22 2219  BP: (!) 154/98   Pulse: 73   Resp: 18   Temp: 98.7 F (37.1 C)   TempSrc: Oral   SpO2: 99%   Weight:  50.8 kg   Physical Exam Vitals and nursing note reviewed.  Constitutional:      General: She is not in acute distress.     Comments: Frail-appearing  HENT:     Head: Normocephalic and atraumatic.  Cardiovascular:     Rate and Rhythm: Normal rate and regular rhythm.     Heart sounds: Normal heart sounds.  Pulmonary:     Effort: Pulmonary effort is normal.     Breath sounds: Normal breath sounds.  Abdominal:     Palpations: Abdomen is soft.     Tenderness: There is no abdominal tenderness.  Neurological:     Mental Status: Mental status is at baseline.     Labs on Admission: I have personally reviewed following labs and imaging studies  CBC: Recent Labs  Lab 07/30/22 0443 08/01/22 2240  WBC 7.4 5.5  NEUTROABS 6.9  --   HGB 10.9* 11.0*  HCT 35.5* 35.2*  MCV 71.0* 69.8*  PLT 326 [id number]   Basic Metabolic Panel: Recent Labs  Lab 07/30/22 0443 08/01/22 2240  NA 138 136  K 3.5 2.5*  CL 103 97*  CO2 21* 22  GLUCOSE 156* 85  BUN 11 13  CREATININE 0.81 0.75  CALCIUM 9.9 9.5  MG  --  2.3   GFR: Estimated Creatinine Clearance: 71.2 mL/min (by C-G formula based on SCr of 0.75 mg/dL). Liver Function Tests: Recent Labs  Lab 07/30/22 0443 08/01/22 2240  AST 27 105*  ALT 14 100*  ALKPHOS 68 59  BILITOT 0.9 0.9  PROT 9.3* 8.6*  ALBUMIN 4.8 4.7   Recent Labs  Lab 07/30/22 0443 08/01/22 2240  LIPASE 24 27   No results for input(s): "AMMONIA" in the last 168 hours. Coagulation Profile: No results for input(s): "INR", "PROTIME" in the last 168 hours. Cardiac Enzymes: No results for input(s): "CKTOTAL", "CKMB", "CKMBINDEX", "TROPONINI" in the last 168 hours. BNP (last 3 results) No results for input(s): "PROBNP" in the last 8760 hours. HbA1C: No results for input(s): "HGBA1C" in the last 72 hours. CBG: No results for input(s): "GLUCAP" in the last 168 hours. Lipid Profile: No results for input(s): "CHOL", "HDL", "LDLCALC", "TRIG", "CHOLHDL", "LDLDIRECT" in the last 72 hours. Thyroid Function Tests: No results for input(s): "TSH", "T4TOTAL", "FREET4", "T3FREE", "THYROIDAB" in the last  72 hours. Anemia Panel: No results for input(s): "VITAMINB12", "FOLATE", "FERRITIN", "TIBC", "IRON", "RETICCTPCT" in the last 72 hours. Urine analysis:    Component Value Date/Time   COLORURINE AMBER (A) 08/01/2022 2351   APPEARANCEUR HAZY (A) 08/01/2022 2351   LABSPEC 1.030 08/01/2022 2351   PHURINE 6.0 08/01/2022 2351   GLUCOSEU NEGATIVE 08/01/2022 2351   HGBUR SMALL (A) 08/01/2022 2351   BILIRUBINUR NEGATIVE 08/01/2022 2351   KETONESUR 20 (A) 08/01/2022 2351   PROTEINUR 100 (A) 08/01/2022 2351   NITRITE NEGATIVE 08/01/2022 2351   LEUKOCYTESUR NEGATIVE 08/01/2022 2351    Radiological Exams on Admission: No results found.   Data Reviewed: Relevant notes from primary care and specialist visits, past discharge summaries as available in EHR, including Care Everywhere. Prior diagnostic testing as pertinent to current admission diagnoses Updated medications and problem lists for reconciliation ED course, including vitals, labs, imaging, treatment and response to treatment Triage notes, nursing and pharmacy notes and ED provider's notes Notable results as noted in HPI   Assessment and Plan: * Intractable vomiting Hypokalemia History of V-fib arrest with antiemetic droperidol 03/2022 History of recurrent intractable vomiting not responding to outpatient management, now with hypokalemia of 2.5 Upper endoscopy December 2023 was unremarkable Patient with history of QT prolongation limiting antiemetic options Will keep n.p.o. for bowel rest IV Compazine, IV Protonix, IV hydration Potassium repletion and monitor Continuous cardiac monitoring   Alcohol use disorder CIWA withdrawal protocol  History of Prolonged QT interval Normal QTc Continue cardiac monitoring in view of history of V-fib arrest  Alcoholic liver disease, unspecified (HCC) Portal hypertension Transaminitis Elevated AST and ALT likely secondary to vomiting To monitor for improvement with  hydration     DVT prophylaxis: Lovenox  Consults: none  Advance Care Planning:   Code Status: Prior   Family Communication: none  Disposition Plan: Back to previous home environment  Severity of Illness: The appropriate patient status for this patient is OBSERVATION. Observation status is judged to be reasonable and necessary in order to provide the required intensity of service to ensure the patient's safety. The patient's presenting symptoms, physical exam findings, and initial radiographic and laboratory data in the context of their medical condition is felt to place them at decreased risk for further clinical deterioration. Furthermore, it is anticipated that the patient will be medically stable for discharge from the hospital within 2 midnights  of admission.   Author: Andris Baumann, MD 08/02/2022 2:54 AM  For on call review [email or web address].

## 2022-08-02 NOTE — ED Notes (Signed)
Pt spitting into emesis bag but not vomiting; pt's resp reg/unlabored, skin dry and sitting calmly in chair; see other RN's recent note as pt reports anxiety. Stated to this RN that she has prescription med at home for anxiety but doesn't take it bc feels like it doesn't help.

## 2022-08-02 NOTE — ED Provider Notes (Signed)
[facility] Provider Note    Event Date/Time   First MD Initiated Contact with Patient 08/01/22 2358     (approximate)   History   Vomiting   HPI  Yvette [name] is a 42 y.o. female with a history of alcohol abuse, cirrhosis, cardiac arrest after droperidol and prolonged QTc, and prior admissions for intractable vomiting who presents with persistent nausea and vomiting for 1 week.  The patient states that she cannot hold anything down.  She reports a small amount of brown material in her vomitus which is otherwise bilious, but no blood.  She denies any abdominal pain.  She has not had any relief with Phenergan suppositories.  I reviewed the past medical records; the patient was most recently seen in the ED on 1/19 for vomiting after running out of phenergan suppositories.  The patient received metaclopramide, midazolam, promethazine, and famotidine in the ED.  Her most recent admission for intractable nausea and vomiting was in December 2023.     Physical Exam   Triage Vital Signs: ED Triage Vitals  Enc Vitals Group     BP 08/01/22 2218 (!) 154/98     Pulse Rate 08/01/22 2218 73     Resp 08/01/22 2218 18     Temp 08/01/22 2218 98.7 F (37.1 C)     Temp Source 08/01/22 2218 Oral     SpO2 08/01/22 2218 99 %     Weight 08/01/22 2219 112 lb (50.8 kg)     Height --      Head Circumference --      Peak Flow --      Pain Score 08/01/22 2219 8     Pain Loc --      Pain Edu? --      Excl. in [place]? --     Most recent vital signs: Vitals:   08/01/22 2218  BP: (!) 154/98  Pulse: 73  Resp: 18  Temp: 98.7 F (37.1 C)  SpO2: 99%     General: Awake, no distress.  CV:  Good peripheral perfusion.  Resp:  Normal effort.  Abd:  Soft and nontender.  No distention.  Other:  Somewhat dry mucous membranes.   ED Results / Procedures / Treatments   Labs (all labs ordered are listed, but only abnormal results are displayed) Labs Reviewed  COMPREHENSIVE  METABOLIC PANEL - Abnormal; Notable for the following components:      Result Value   Potassium 2.5 (*)    Chloride 97 (*)    Total Protein 8.6 (*)    AST 105 (*)    ALT 100 (*)    Anion gap 17 (*)    All other components within normal limits  CBC - Abnormal; Notable for the following components:   Hemoglobin 11.0 (*)    HCT 35.2 (*)    MCV 69.8 (*)    MCH 21.8 (*)    RDW 18.6 (*)    All other components within normal limits  URINALYSIS, ROUTINE W REFLEX MICROSCOPIC - Abnormal; Notable for the following components:   Color, Urine AMBER (*)    APPearance HAZY (*)    Hgb urine dipstick SMALL (*)    Ketones, ur 20 (*)    Protein, ur 100 (*)    Bacteria, UA MANY (*)    All other components within normal limits  LIPASE, BLOOD  MAGNESIUM  CBC  CREATININE, SERUM  BASIC METABOLIC PANEL  POC URINE PREG, ED  POC URINE  PREG, ED     EKG  ED ECG REPORT I, Arta Silence, the attending physician, personally viewed and interpreted this ECG.  Date: 08/02/2022 EKG Time: 0052 Rate: 54 Rhythm: normal sinus rhythm QRS Axis: normal Intervals: normal ST/T Wave abnormalities: normal Narrative Interpretation: no evidence of acute ischemia    RADIOLOGY     PROCEDURES:  Critical Care performed: Yes, see critical care procedure note(s)  .Critical Care  Performed by: Arta Silence, MD Authorized by: Arta Silence, MD   Critical care provider statement:    Critical care time (minutes):  30   Critical care time was exclusive of:  Separately billable procedures and treating other patients   Critical care was necessary to treat or prevent imminent or life-threatening deterioration of the following conditions:  Dehydration and metabolic crisis   Critical care was time spent personally by me on the following activities:  Development of treatment plan with patient or surrogate, discussions with consultants, evaluation of patient's response to treatment, examination of  patient, ordering and review of laboratory studies, ordering and review of radiographic studies, ordering and performing treatments and interventions, pulse oximetry, re-evaluation of patient's condition, review of old charts and obtaining history from patient or surrogate   Care discussed with: admitting provider      MEDICATIONS ORDERED IN ED: Medications  LORazepam (ATIVAN) tablet 1-4 mg (has no administration in time range)    Or  LORazepam (ATIVAN) injection 1-4 mg (has no administration in time range)  thiamine (VITAMIN B1) tablet 100 mg (has no administration in time range)    Or  thiamine (VITAMIN B1) injection 100 mg (has no administration in time range)  folic acid (FOLVITE) tablet 1 mg (has no administration in time range)  multivitamin with minerals tablet 1 tablet (has no administration in time range)  enoxaparin (LOVENOX) injection 40 mg (has no administration in time range)  acetaminophen (TYLENOL) tablet 650 mg (has no administration in time range)    Or  acetaminophen (TYLENOL) suppository 650 mg (has no administration in time range)  lactated ringers infusion (has no administration in time range)  morphine (PF) 2 MG/ML injection 2 mg (has no administration in time range)  nicotine (NICODERM CQ - dosed in mg/24 hours) patch 14 mg (has no administration in time range)  prochlorperazine (COMPAZINE) injection 10 mg (has no administration in time range)  potassium chloride 10 mEq in 100 mL IVPB (0 mEq Intravenous Stopped 08/02/22 0341)  potassium chloride 10 mEq in 100 mL IVPB (0 mEq Intravenous Stopped 08/02/22 0234)  lactated ringers bolus 1,000 mL (1,000 mLs Intravenous New Bag/Given 08/02/22 0127)  metoCLOPramide (REGLAN) injection 10 mg (10 mg Intravenous Given 08/02/22 0124)     IMPRESSION / MDM / [place] / ED COURSE  I reviewed the triage vital signs and the nursing notes.  42 year old female with PMH as noted above presents with recurrent and persistent  vomiting but no abdominal pain.  Physical exam is unremarkable for acute findings.  Initial lab workup shows potassium of 2.5 which was normal during her last ED visit several days ago.  Urinalysis shows bacteria and WBCs as well as ketones and protein.  This is consistent with her baseline.  Differential diagnosis includes, but is not limited to, cyclical vomiting, gastritis, gastroparesis.  The patient denies any acute symptoms related to the hypokalemia and there are no EKG changes.  We will give IV fluids, IV potassium repletion, and reassess.  Patient's presentation is most consistent with acute presentation with  potential threat to life or bodily function.  The patient is on the cardiac monitor to evaluate for evidence of arrhythmia and/or significant heart rate changes.   ----------------------------------------- 2:44 AM on 08/02/2022 -----------------------------------------  The patient reports that her nausea is somewhat better but is still not tolerating p.o.  Given the hypokalemia she will need admission for further management.  I consulted Dr. Para March from the hospitalist service; based our discussion she agrees to admit the patient.  FINAL CLINICAL IMPRESSION(S) / ED DIAGNOSES   Final diagnoses:  Nausea and vomiting, unspecified vomiting type  Hypokalemia     Rx / DC Orders   ED Discharge Orders     None        Note:  This document was prepared using Dragon voice recognition software and may include unintentional dictation errors.    Dionne Bucy, MD 08/02/22 [phone]

## 2022-08-02 NOTE — Assessment & Plan Note (Signed)
CIWA withdrawal protocol

## 2022-08-02 NOTE — Assessment & Plan Note (Addendum)
Hypokalemia History of V-fib arrest with antiemetic droperidol 03/2022 History of recurrent intractable vomiting not responding to outpatient management, now with hypokalemia of 2.5 Upper endoscopy December 2023 was unremarkable Patient with history of QT prolongation limiting antiemetic options Will keep n.p.o. for bowel rest IV Compazine, IV Protonix, IV hydration Potassium repletion and monitor Continuous cardiac monitoring

## 2022-08-02 NOTE — ED Notes (Signed)
Patient states feeling restless, anxious and nervous. Patient states having no other withdraw symptoms and this does not seem to be related to alcohol withdrawal. MD Dwyane Dee notified and requested medication for anxiety.

## 2022-08-02 NOTE — ED Notes (Signed)
Pt switched from chair to bed with 1 assist to help keep cords and lines from tangling.

## 2022-08-02 NOTE — Assessment & Plan Note (Signed)
Normal QTc Continue cardiac monitoring in view of history of V-fib arrest

## 2022-08-02 NOTE — Plan of Care (Signed)
No significant events overnight, patient was admitted last night due to intractable vomiting.  Patient would like to try to for liquid diet, will gradually advance as per tolerance.  Potassium is still low which was repleted.  UDS positive for benzos and tricyclic.  Patient denies any abdominal pain, no chest pain or palpitation, no shortness of breath.  We will continue current treatment and monitor overnight.

## 2022-08-03 ENCOUNTER — Encounter: Payer: Self-pay | Admitting: Internal Medicine

## 2022-08-03 DIAGNOSIS — R1110 Vomiting, unspecified: Secondary | ICD-10-CM | POA: Diagnosis not present

## 2022-08-03 LAB — CBC
HCT: 32.6 % — ABNORMAL LOW (ref 36.0–46.0)
Hemoglobin: 10 g/dL — ABNORMAL LOW (ref 12.0–15.0)
MCH: 21.6 pg — ABNORMAL LOW (ref 26.0–34.0)
MCHC: 30.7 g/dL (ref 30.0–36.0)
MCV: 70.4 fL — ABNORMAL LOW (ref 80.0–100.0)
Platelets: 166 10*3/uL (ref 150–400)
RBC: 4.63 MIL/uL (ref 3.87–5.11)
RDW: 18.5 % — ABNORMAL HIGH (ref 11.5–15.5)
WBC: 3.2 10*3/uL — ABNORMAL LOW (ref 4.0–10.5)
nRBC: 0 % (ref 0.0–0.2)

## 2022-08-03 LAB — BASIC METABOLIC PANEL
Anion gap: 7 (ref 5–15)
BUN: 7 mg/dL (ref 6–20)
CO2: 26 mmol/L (ref 22–32)
Calcium: 8.5 mg/dL — ABNORMAL LOW (ref 8.9–10.3)
Chloride: 103 mmol/L (ref 98–111)
Creatinine, Ser: 0.78 mg/dL (ref 0.44–1.00)
GFR, Estimated: >60 mL/min (ref >60)
Glucose, Bld: 88 mg/dL (ref 70–99)
Potassium: 3.2 mmol/L — ABNORMAL LOW (ref 3.5–5.1)
Sodium: 136 mmol/L (ref 135–145)

## 2022-08-03 LAB — HEPATIC FUNCTION PANEL
ALT: 46 U/L — ABNORMAL HIGH (ref 0–44)
AST: 28 U/L (ref 15–41)
Albumin: 3.5 g/dL (ref 3.5–5.0)
Alkaline Phosphatase: 47 U/L (ref 38–126)
Bilirubin, Direct: 0.1 mg/dL (ref 0.0–0.2)
Indirect Bilirubin: 0.7 mg/dL (ref 0.3–0.9)
Total Bilirubin: 0.8 mg/dL (ref 0.3–1.2)
Total Protein: 6.5 g/dL (ref 6.5–8.1)

## 2022-08-03 LAB — PROTIME-INR
INR: 1.2 (ref 0.8–1.2)
Prothrombin Time: 15 seconds (ref 11.4–15.2)

## 2022-08-03 LAB — PHOSPHORUS: Phosphorus: 3.7 mg/dL (ref 2.5–4.6)

## 2022-08-03 LAB — MAGNESIUM: Magnesium: 1.8 mg/dL (ref 1.7–2.4)

## 2022-08-03 MED ORDER — POTASSIUM CHLORIDE CRYS ER 20 MEQ PO TBCR
40.0000 meq | EXTENDED_RELEASE_TABLET | Freq: Once | ORAL | Status: AC
  Administered 2022-08-03: 40 meq via ORAL
  Filled 2022-08-03: qty 2

## 2022-08-03 MED ORDER — MAGNESIUM OXIDE -MG SUPPLEMENT 400 (240 MG) MG PO TABS: 400.0000 mg | ORAL_TABLET | Freq: Every day | ORAL | 0 refills | Status: DC

## 2022-08-03 MED ORDER — FERROUS SULFATE 325 (65 FE) MG PO TABS: 325.0000 mg | ORAL_TABLET | Freq: Every day | ORAL | 2 refills | Status: DC

## 2022-08-03 MED ORDER — CYANOCOBALAMIN 500 MCG PO TABS: 500.0000 ug | ORAL_TABLET | Freq: Every day | ORAL | 2 refills | Status: AC

## 2022-08-03 MED ORDER — FOLIC ACID 1 MG PO TABS: 1.0000 mg | ORAL_TABLET | Freq: Every day | ORAL | 2 refills | Status: AC

## 2022-08-03 MED ORDER — POTASSIUM CHLORIDE 10 MEQ/100ML IV SOLN
10.0000 meq | INTRAVENOUS | Status: AC
  Administered 2022-08-03: 10 meq via INTRAVENOUS
  Filled 2022-08-03: qty 100

## 2022-08-03 MED ORDER — MAGNESIUM OXIDE -MG SUPPLEMENT 400 (240 MG) MG PO TABS
400.0000 mg | ORAL_TABLET | Freq: Two times a day (BID) | ORAL | Status: DC
  Administered 2022-08-03: 400 mg via ORAL
  Filled 2022-08-03: qty 1

## 2022-08-03 NOTE — TOC Initial Note (Signed)
Transition of Care [facility]) - Initial/Assessment Note    Patient Details  Name: Yvette [name] MRN: [id number] Date of Birth: June 09, 1981  [facility] CM/SW Contact:    Gerilyn Pilgrim, LCSW Phone Number: 08/03/2022, 10:48 AM  Clinical Narrative:    Substance use resources added to DC summary pt has orders in to discharge.                Expected Discharge Plan: Home/Self Care Barriers to Discharge: Barriers Resolved   Patient Goals and CMS Choice     Choice offered to / list presented to : Patient      Expected Discharge Plan and Services       Living arrangements for the past 2 months: Single Family Home Expected Discharge Date: 08/03/22                                    Prior Living Arrangements/Services Living arrangements for the past 2 months: Single Family Home Lives with:: Spouse Patient language and need for interpreter reviewed:: Yes        Need for Family Participation in Patient Care: No (Comment) Care giver support system in place?: No (comment)   Criminal Activity/Legal Involvement Pertinent to Current Situation/Hospitalization: No - Comment as needed  Activities of Daily Living Home Assistive Devices/Equipment: None ADL Screening (condition at time of admission) Patient's cognitive ability adequate to safely complete daily activities?: Yes Is the patient deaf or have difficulty hearing?: No Does the patient have difficulty seeing, even when wearing glasses/contacts?: No Does the patient have difficulty concentrating, remembering, or making decisions?: No Patient able to express need for assistance with ADLs?: Yes Does the patient have difficulty dressing or bathing?: No Independently performs ADLs?: Yes (appropriate for developmental age) Does the patient have difficulty walking or climbing stairs?: No Weakness of Legs: None Weakness of Arms/Hands: None  Permission Sought/Granted                  Emotional  Assessment       Orientation: : Oriented to Self, Oriented to Place, Oriented to  Time      Admission diagnosis:  Hypokalemia [E87.6] Intractable vomiting [R11.10] Nausea and vomiting, unspecified vomiting type [R11.2] Patient Active Problem List   Diagnosis Date Noted   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 03/2022 08/02/2022   History of Prolonged QT interval 08/02/2022   Alcohol use disorder 08/02/2022   Iron deficiency 07/22/2022   Suicide ideation 04/05/2022   Delirium tremens ([place]) 04/05/2022   Hypovolemic shock ([place]) 04/05/2022   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   Dilated cardiomyopathy ([place])    Hypophosphatemia 04/01/2022   Acute hypoxemic respiratory failure ([place]) 81/15/7262   Chronic systolic CHF (congestive heart failure) ([place]) 04/01/2022   COVID-19 virus infection 04/01/2022   Ventricular fibrillation ([place]) 03/30/2022   Hypomagnesemia 01/09/2022   Nausea vomiting and diarrhea 01/08/2022   Intractable nausea and vomiting 06/28/2021   Tobacco abuse 06/28/2021   Alcohol abuse 06/28/2021   Hematemesis 03/55/9741   Alcoholic liver disease, unspecified ([place]) 10/26/2020   Alcohol withdrawal ([place]) 63/84/5364   Alcoholic ketoacidosis 68/09/2120   AKI (acute kidney injury) ([place]) 10/26/2020   High serum osmolar gap 10/26/2020   Hiatal hernia    Portal hypertension ([facility])    Nausea & vomiting 48/25/0037   Alcoholic gastritis 04/88/8916   Intractable vomiting 10/01/2019   Alcohol use, daily 09/30/2019  Elevated LFTs 09/30/2019   Thrombocytopenia ([place]) 09/30/2019   Hypokalemia    Dehydration    Food poisoning    Other neutropenia ([place]) 06/11/2016   Leukopenia 02/03/2015   PCP:  [facility] [place]:   [place] [address]), [place] [address]) [place] [address] Phone: [phone] Fax: [phone]     Social Determinants of Health  (SDOH) Social History: SDOH Screenings   Food Insecurity: No Food Insecurity (08/02/2022)  Housing: Low Risk  (08/02/2022)  Transportation Needs: No Transportation Needs (08/02/2022)  Utilities: Not At Risk (08/02/2022)  Tobacco Use: High Risk (08/03/2022)   SDOH Interventions: Housing Interventions: Intervention Not Indicated Transportation Interventions: Intervention Not Indicated   Readmission Risk Interventions     No data to display

## 2022-08-03 NOTE — Discharge Summary (Signed)
[facility] Discharge Summary   Patient: Yvette [name] TOI:[id number]  PCP: [facility]  Date of admission: 08/01/2022   Date of discharge: 08/03/2022     Discharge Diagnoses:  Principal Problem:   Intractable vomiting Active Problems:   Portal hypertension ([place])   Alcoholic liver disease, unspecified ([place])   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 03/2022   History of Prolonged QT interval   Alcohol use disorder   Admitted From: Home Disposition:  Home   Recommendations for Outpatient Follow-up:  F/u with PCP in  wk, repeat CBC and BMP in 1 wk Up with GI in 1 to 2 weeks for liver cirrhosis Follow up LABS/TEST: CBC and BMP in 1 week   Diet recommendation: Cardiac diet  Activity: The patient is advised to gradually reintroduce usual activities, as tolerated  Discharge Condition: stable  Code Status: Full code   History of present illness: As per the H and P dictated on admission Hospital Course:  Yvette [name] is a 42 y.o. female with medical history significant for alcohol use disorder., liver cirrhosis, recurrent ED visits and hospitalizations for intractable vomiting, most recently December 2023, QT prolongation with history of V-fib arrest 03/2022 after droperidol for emesis, who presents emergency department for chief concerns of intractable nausea and vomiting.  Patient was seen 2 days prior on 1/19, treated and discharged with Phenergan suppositories but vomiting which could when she ran out of suppositories.  She denies fever or chills abdominal pain.  Vomiting is bilious, nonbloody and without coffee grounds.  Denies diarrhea denies blood in stool or melanotic stool.  During her last hospitalization She had an upper endoscopy that was unremarkable. ED course and data review: Vitals unremarkable.  Labs significant for potassium of 2.5.  Mild transaminitis with AST of 105 and ALT 100.  Lipase normal at 27 WBC normal.  EKG, personally  viewed and interpreted unremarkable and with normal QTc.  Patient was treated with Reglan, LR bolus and started on IV potassium and hospitalist consulted for admission  Assessment and Plan:  # Intractable vomiting, Hypokalemia # History of V-fib arrest with antiemetic droperidol 03/2022 History of recurrent intractable vomiting not responding to outpatient management, presented with hypokalemia of 2.5, Upper endoscopy December 2023 was unremarkable Patient with history of QT prolongation limiting antiemetic options.  Patient was kept n.p.o., IV fluid and antiemetic as needed were given.  Potassium was repleted.  Patient's symptoms improved, tolerating diet well.  Electrolytes repleted.  Diet was advanced.  Patient seems to be stable to be discharge, agreed with the discharge planning. # Alcohol use disorder, CIWA withdrawal protocol # History of Prolonged QT interval, Normal Qtc, no events during hospital stay # Alcoholic liver disease, unspecified, Portal hypertension and Transaminitis Elevated AST and ALT likely secondary to vomiting.  Patient was adequately hydrated, AST and ALT improved.  Patient was advised to follow-up with GI for further management as an outpatient. Pancytopenia most likely due to liver cirrhosis and splenomegaly.  Patient was advised to follow with PCP as an outpatient.  Body mass index is 21.91 kg/m.  Nutrition Interventions:     Patient was ambulatory without any assistance. On the day of the discharge the patient's vitals were stable, and no other acute medical condition were reported by patient. the patient was felt safe to be discharge at Home.  Consultants: None Procedures: None  Discharge Exam: General: Appear in no distress, no Rash; Oral Mucosa Clear, moist. Cardiovascular: S1 and S2 Present,  no Murmur, Respiratory: normal respiratory effort, Bilateral Air entry present and no Crackles, no wheezes Abdomen: Bowel Sound present, Soft and no tenderness, no  hernia Extremities: no Pedal edema, no calf tenderness Neurology: alert and oriented to time, place, and person affect appropriate.  Filed Weights   08/01/22 2219 08/02/22 2218  Weight: 50.8 kg 52.6 kg   Vitals:   08/03/22 0735 08/03/22 0747  BP: (!) 146/78   Pulse: (!) 46 (!) 52  Resp: 20   Temp: 97.8 F (36.6 C)   SpO2: 100%     DISCHARGE MEDICATION: Allergies as of 08/03/2022       Reactions   Inapsine [droperidol] Anaphylaxis   Zofran [ondansetron] Other (See Comments)   Prolonged QT        Medication List     STOP taking these medications    ferrous sulfate 220 (44 Fe) MG/5ML solution Replaced by: ferrous sulfate 325 (65 FE) MG tablet   metoCLOPramide 10 MG tablet Commonly known as: REGLAN   nicotine 21 mg/24hr patch Commonly known as: NICODERM CQ - dosed in mg/24 hours   thiamine 100 MG tablet Commonly known as: Vitamin B-1       TAKE these medications    cyanocobalamin 500 MCG tablet Commonly known as: VITAMIN B12 Take 1 tablet (500 mcg total) by mouth daily.   Depo-Provera 150 MG/ML injection Generic drug: medroxyPROGESTERone Inject 150 mg into the muscle every 3 (three) months.   desvenlafaxine 50 MG 24 hr tablet Commonly known as: PRISTIQ Take 50 mg by mouth daily.   ferrous sulfate 325 (65 FE) MG tablet Take 1 tablet (325 mg total) by mouth daily. Replaces: ferrous sulfate 220 (44 Fe) QM/5HQ solution   folic acid 1 MG tablet Commonly known as: FOLVITE Take 1 tablet (1 mg total) by mouth daily.   magnesium oxide 400 (240 Mg) MG tablet Commonly known as: MAG-OX Take 1 tablet (400 mg total) by mouth daily.   multivitamin with minerals Tabs tablet Take 1 tablet by mouth daily.   naltrexone 50 MG tablet Commonly known as: DEPADE Take 50 mg by mouth daily.   pantoprazole 40 MG tablet Commonly known as: PROTONIX Take 1 tablet (40 mg total) by mouth daily.   promethazine 12.5 MG tablet Commonly known as: PHENERGAN Take 1  tablet (12.5 mg total) by mouth every 12 (twelve) hours as needed for nausea or vomiting. What changed: Another medication with the same name was removed. Continue taking this medication, and follow the directions you see here.       Allergies  Allergen Reactions   Inapsine [Droperidol] Anaphylaxis   Zofran [Ondansetron] Other (See Comments)    Prolonged QT   Discharge Instructions     Diet - low sodium heart healthy   Complete by: As directed    Discharge instructions   Complete by: As directed    F/u with PCP in  wk, repeat CBC and BMP in 1 wk Up with GI in 1 to 2 weeks for liver cirrhosis   Increase activity slowly   Complete by: As directed        The results of significant diagnostics from this hospitalization (including imaging, microbiology, ancillary and laboratory) are listed below for reference.    Significant Diagnostic Studies: No results found.  Microbiology: No results found for this or any previous visit (from the past 240 hour(s)).   Labs: CBC: Recent Labs  Lab 07/30/22 0443 08/01/22 2240 08/02/22 0502 08/03/22 0545  WBC 7.4 5.5 5.0  3.2*  NEUTROABS 6.9  --   --   --   HGB 10.9* 11.0* 9.8* 10.0*  HCT 35.5* 35.2* 31.4* 32.6*  MCV 71.0* 69.8* 69.9* 70.4*  PLT 326 253 211 166   Basic Metabolic Panel: Recent Labs  Lab 07/30/22 0443 08/01/22 2240 08/02/22 0502 08/03/22 0545  NA 138 136 136 136  K 3.5 2.5* 3.1* 3.2*  CL 103 97* 100 103  CO2 21* 22 24 26   GLUCOSE 156* 85 85 88  BUN 11 13 12 7   CREATININE 0.81 0.75 0.68 0.78  CALCIUM 9.9 9.5 9.0 8.5*  MG  --  2.3 2.0 1.8  PHOS  --   --  4.0 3.7   Liver Function Tests: Recent Labs  Lab 07/30/22 0443 08/01/22 2240 08/02/22 0502 08/03/22 0545  AST 27 105* 64* 28  ALT 14 100* 75* 46*  ALKPHOS 68 59 52 47  BILITOT 0.9 0.9 0.7 0.8  PROT 9.3* 8.6* 7.2 6.5  ALBUMIN 4.8 4.7 3.9 3.5   Recent Labs  Lab 07/30/22 0443 08/01/22 2240  LIPASE 24 27   No results for input(s): "AMMONIA" in  the last 168 hours. Cardiac Enzymes: No results for input(s): "CKTOTAL", "CKMB", "CKMBINDEX", "TROPONINI" in the last 168 hours. BNP (last 3 results) Recent Labs    03/30/22 1246  BNP 11.2   CBG: No results for input(s): "GLUCAP" in the last 168 hours.  Time spent: 35 minutes  Signed:  08/03/22  [facility]  08/03/2022 11:30 AM

## 2022-08-03 NOTE — Progress Notes (Signed)
Patient d/c in stable condition via w/c to private vehicle

## 2022-08-06 ENCOUNTER — Other Ambulatory Visit: Payer: Self-pay | Admitting: Nurse Practitioner

## 2022-08-06 DIAGNOSIS — K7460 Unspecified cirrhosis of liver: Secondary | ICD-10-CM

## 2022-08-13 ENCOUNTER — Ambulatory Visit
Admission: RE | Admit: 2022-08-13 | Discharge: 2022-08-13 | Disposition: A | Discharge status: Home / Self Care | Payer: Medicaid Other | Source: Ambulatory Visit | Attending: Nurse Practitioner | Admitting: Nurse Practitioner

## 2022-08-13 DIAGNOSIS — K7460 Unspecified cirrhosis of liver: Secondary | ICD-10-CM | POA: Diagnosis present

## 2022-08-25 ENCOUNTER — Ambulatory Visit: Payer: Medicaid Other | Attending: Nurse Practitioner

## 2022-08-25 DIAGNOSIS — I428 Other cardiomyopathies: Secondary | ICD-10-CM

## 2022-08-25 LAB — ECHOCARDIOGRAM LIMITED
AR max vel: 2.22 cm2
AV Area VTI: 2.09 cm2
AV Area mean vel: 2.12 cm2
AV Mean grad: 4 mmHg
AV Peak grad: 8.2 mmHg
Ao pk vel: 1.43 m/s
Area-P 1/2: 4.29 cm2
Calc EF: 58.8 %
S' Lateral: 2.4 cm
Single Plane A2C EF: 62.4 %
Single Plane A4C EF: 55.1 %

## 2022-08-27 ENCOUNTER — Ambulatory Visit: Payer: Medicaid Other | Attending: Nurse Practitioner | Admitting: Nurse Practitioner

## 2022-08-27 ENCOUNTER — Encounter: Payer: Self-pay | Admitting: Nurse Practitioner

## 2022-08-27 ENCOUNTER — Other Ambulatory Visit
Admission: RE | Admit: 2022-08-27 | Discharge: 2022-08-27 | Disposition: A | Discharge status: Home / Self Care | Payer: Medicaid Other | Source: Ambulatory Visit | Attending: Nurse Practitioner | Admitting: Nurse Practitioner

## 2022-08-27 VITALS — BP 120/74 | HR 91 | Ht 61.0 in | Wt 115.2 lb

## 2022-08-27 DIAGNOSIS — E876 Hypokalemia: Secondary | ICD-10-CM

## 2022-08-27 DIAGNOSIS — R9431 Abnormal electrocardiogram [ECG] [EKG]: Secondary | ICD-10-CM | POA: Diagnosis not present

## 2022-08-27 DIAGNOSIS — F1010 Alcohol abuse, uncomplicated: Secondary | ICD-10-CM

## 2022-08-27 DIAGNOSIS — I4729 Other ventricular tachycardia: Secondary | ICD-10-CM

## 2022-08-27 DIAGNOSIS — Z8674 Personal history of sudden cardiac arrest: Secondary | ICD-10-CM

## 2022-08-27 DIAGNOSIS — I428 Other cardiomyopathies: Secondary | ICD-10-CM

## 2022-08-27 LAB — BASIC METABOLIC PANEL
Anion gap: 12 (ref 5–15)
BUN: 11 mg/dL (ref 6–20)
CO2: 19 mmol/L — ABNORMAL LOW (ref 22–32)
Calcium: 9.3 mg/dL (ref 8.9–10.3)
Chloride: 103 mmol/L (ref 98–111)
Creatinine, Ser: 0.65 mg/dL (ref 0.44–1.00)
GFR, Estimated: >60 mL/min (ref >60)
Glucose, Bld: 91 mg/dL (ref 70–99)
Potassium: 4.5 mmol/L (ref 3.5–5.1)
Sodium: 134 mmol/L — ABNORMAL LOW (ref 135–145)

## 2022-08-27 LAB — MAGNESIUM: Magnesium: 1.9 mg/dL (ref 1.7–2.4)

## 2022-08-27 NOTE — Patient Instructions (Signed)
Medication Instructions:  Your physician recommends that you continue on your current medications as directed. Please refer to the Current Medication list given to you today.  *If you need a refill on your cardiac medications before your next appointment, please call your pharmacy*   Lab Work: BMP and magnesium level - Please go to the [facility]. You will check in at the front desk to the right as you walk into the atrium. Valet Parking is offered if needed. - No appointment needed. You may go any day between 7 am and 6 pm.   If you have labs (blood work) drawn today and your tests are completely normal, you will receive your results only by: [place] (if you have MyChart) OR A paper copy in the mail If you have any lab test that is abnormal or we need to change your treatment, we will call you to review the results.   Testing/Procedures: No testing ordered   Follow-Up: At [facility], you and your health needs are our priority.  As part of our continuing mission to provide you with exceptional heart care, we have created designated Provider Care Teams.  These Care Teams include your primary Cardiologist (physician) and Advanced Practice Providers (APPs -  Physician Assistants and Nurse Practitioners) who all work together to provide you with the care you need, when you need it.  We recommend signing up for the patient portal called "MyChart".  Sign up information is provided on this After Visit Summary.  MyChart is used to connect with patients for Virtual Visits (Telemedicine).  Patients are able to view lab/test results, encounter notes, upcoming appointments, etc.  Non-urgent messages can be sent to your provider as well.   To learn more about what you can do with MyChart, go to [email or web address].    Your next appointment:   6 month(s)  Provider:   Kathlyn [place], MD

## 2022-08-27 NOTE — Progress Notes (Signed)
Office Visit    Patient Name: Yvette [name] Date of Encounter: 08/27/2022  Primary Care Provider:  [facility] Cardiologist:  Kathlyn Sacramento, MD  Chief Complaint    42 year old female with history of tobacco alcohol abuse, alcoholic gastritis, persistent nausea vomiting, electrolyte abnormalities, prolonged QT, ventricular tachycardia, GERD, anemia, transaminitis, neutropenia, and Kawasaki disease, who presents for follow-up related to prolonged QT and ventricular arrhythmias.  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 in the setting of n/v->gastritis/etoh.   Leukopenia    Neutropenia (HCC)    NICM (nonischemic cardiomyopathy) ([place])    a. 06/2021 Echo: EF 50-55%, [name]; b. 09/2021 ETT: Ex time 6:19, [name], [name]; c. 03/2022 Echo: EF 35-40%, [name]; d. 08/2022 Echo: EF 60-65%, no rwma, [name].   NSVT (nonsustained ventricular tachycardia) ([place])    a. Noted during admission 06/2021 assoc w/ palpitations and presyncope in the setting of profound QT prolongation/gastritis.   Tobacco abuse    Transaminitis    Past Surgical History:  Procedure Laterality Date   ESOPHAGOGASTRODUODENOSCOPY N/A 10/29/2020   Procedure: ESOPHAGOGASTRODUODENOSCOPY (EGD);  Surgeon: Jonathon Bellows, MD;  Location: [facility] ENDOSCOPY;  Service: Gastroenterology;  [name];   ESOPHAGOGASTRODUODENOSCOPY (EGD) WITH PROPOFOL N/A 03/14/2020   Procedure: ESOPHAGOGASTRODUODENOSCOPY (EGD) WITH PROPOFOL;  Surgeon: Virgel Manifold, MD;  [name];  Service: Endoscopy;  [name];   ESOPHAGOGASTRODUODENOSCOPY (EGD) WITH PROPOFOL N/A 02/25/2021   Procedure: ESOPHAGOGASTRODUODENOSCOPY (EGD)  WITH PROPOFOL;  Surgeon: Virgel Manifold, MD;  [name];  Service: Endoscopy;  [name];   ESOPHAGOGASTRODUODENOSCOPY (EGD) WITH PROPOFOL N/A 06/25/2022   Procedure: ESOPHAGOGASTRODUODENOSCOPY (EGD) WITH PROPOFOL;  Surgeon: Annamaria Helling, DO;  Location: [facility];  Service: Gastroenterology;  [name];    Allergies  Allergies  Allergen Reactions   Inapsine [Droperidol] Anaphylaxis   Zofran [Ondansetron] Other (See Comments)    Prolonged QT    History of Present Illness    42 year old female with the above complex past medical history including tobacco abuse, alcohol abuse, alcoholic gastritis, persistent nausea vomiting, electrolyte abnormalities, prolonged QT, ventricular tachycardia, GERD, anemia, transaminitis, neutropenia, and Kawasaki disease.  She also has a family history of preacher sudden cardiac death.  She has had multiple admissions and ED visits with associated hypokalemia and hypomagnesemia.  She had a normal EGD in August 2022 and more recently in January 2024.  In December 2022, she was admitted with ongoing nausea and vomiting and required potassium and magnesium supplementation.  She was also being treated with Phenergan on top of usual home dose of Remeron.  In this setting, she was noted to have frequent ventricular ectopy and runs of nonsustained VT with significant QT prolongation.  She was placed on intravenous lidocaine with improvement.  Echo showed an EF of 50 to 55%.  She was subsequently discharged on beta-blocker therapy with recommendation for alcohol cessation.  Subsequent outpatient monitoring showed predominantly sinus rhythm and sinus tachycardia with an average heart rate of 106 bpm.  She had rare PACs and PVCs with 1 brief run of PSVT.  She  subsequently underwent exercise treadmill testing and was able to walk 6 minutes and 19 seconds without significant ST or T changes.  In September 2023, she presented to [facility] with worsening nausea and vomiting despite antiemetics at home.  She had also been experiencing URI symptoms.  In the emergency department, she was treated with droperidol for nausea and became unresponsive with agonal breathing requiring intubation, CPR, and defibrillation in the setting of VF.Marland Kitchen  Repeat echo showed EF of 35 to 40% with severe anterolateral and anterior hypokinesis.  She had ongoing QT prolongation on telemetry.  GDMT was limited by borderline hypotension.  She was counseled on the importance of alcohol cessation and avoidance of QT prolonging medications including antiemetics such as droperidol, and ondansetron,).    At office follow-up in November, she was doing well, and was using naltrexone with significant reduction in alcohol consumption.  Unfortunately, she was readmitted in December and again in January secondary to unrelenting nausea and vomiting.  Fortunately, she does not have any ventricular arrhythmias during her admissions.  She notes that following both admissions, she did require as needed Phenergan suppositories for 2 to 3 days after admission but she has not required any in the past several weeks.  She continues to drink at least 2, 24 ounce natural ice beers per week.  She is no longer taking naltrexone because she does not feel like it helped her much.  Though she is prescribed magnesium oxide, she has stopped taking this.  She does not experience chest pain and denies dyspnea, palpitations, PND, orthopnea, dizziness, syncope, edema, or early satiety.  She continues to smoke cigarettes.  She just had a follow-up echocardiogram 2 days ago which showed improvement in EF to 60-65% without regional wall motion abnormalities.  Home Medications    Current Outpatient Medications  Medication Sig Dispense Refill   DEPO-PROVERA 150 MG/ML injection Inject 150 mg into the muscle every 3 (three) months.     pantoprazole (PROTONIX) 40 MG tablet Take 1 tablet (40 mg total) by  mouth daily. 30 tablet 2   cyanocobalamin (VITAMIN B12) 500 MCG tablet Take 1 tablet (500 mcg total) by mouth daily. (Patient not taking: Reported on 08/27/2022) 30 tablet 2   desvenlafaxine (PRISTIQ) 50 MG 24 hr tablet Take 50 mg by mouth daily. (Patient not taking: Reported on 08/27/2022)     ferrous sulfate 325 (65 FE) MG tablet Take 1 tablet (325 mg total) by mouth daily. (Patient not taking: Reported on 08/27/2022) 30 tablet 2   folic acid (FOLVITE) 1 MG tablet Take 1 tablet (1 mg total) by mouth daily. (Patient not taking: Reported on 08/27/2022) 30 tablet 2   magnesium oxide (MAG-OX) 400 (240 Mg) MG tablet Take 1 tablet (400 mg total) by mouth daily. (Patient not taking: Reported on 08/27/2022) 30 tablet 0   Multiple Vitamin (MULTIVITAMIN WITH MINERALS) TABS tablet Take 1 tablet by mouth daily. (Patient not taking: Reported on 08/27/2022) 30 tablet 0   naltrexone (DEPADE) 50 MG tablet Take 50 mg by mouth daily. (Patient not taking: Reported on 08/27/2022)     No current facility-administered medications for this visit.     Review of Systems    Currently stable without nausea or vomiting.  She denies chest pain, palpitations, dyspnea, pnd, orthopnea, n, v, dizziness, syncope, edema, weight gain, or early satiety.  All other systems reviewed and are otherwise negative except as noted above.    Physical Exam    VS:  BP 120/74 (BP  Location: Left Arm, Patient Position: Sitting, Cuff Size: Normal)   Pulse 91   Ht 5' 1"$  (1.549 m)   Wt 115 lb 3.2 oz (52.3 kg)   LMP 04/11/2022 (Approximate)   SpO2 100%   BMI 21.77 kg/m  , BMI Body mass index is 21.77 kg/m.     GEN: Well nourished, well developed, in no acute distress. HEENT: normal. Neck: Supple, no JVD, carotid bruits, or masses. Cardiac: RRR, no murmurs, rubs, or gallops. No clubbing, cyanosis, edema.  Radials 2+/PT 2+ and equal bilaterally.  Respiratory:  Respirations regular and unlabored, clear to auscultation bilaterally. GI: Soft,  nontender, nondistended, BS + x 4. MS: no deformity or atrophy. Skin: warm and dry, no rash. Neuro:  Strength and sensation are intact. Psych: Normal affect.  Accessory Clinical Findings    ECG personally reviewed by me today -regular sinus rhythm, 91, septal infarct.  QTc 404 ms- no acute changes.  Lab Results  Component Value Date   WBC 3.2 (L) 08/03/2022   HGB 10.0 (L) 08/03/2022   HCT 32.6 (L) 08/03/2022   MCV 70.4 (L) 08/03/2022   PLT 166 08/03/2022   Lab Results  Component Value Date   CREATININE 0.65 08/27/2022   BUN 11 08/27/2022   NA 134 (L) 08/27/2022   K 4.5 08/27/2022   CL 103 08/27/2022   CO2 19 (L) 08/27/2022   Lab Results  Component Value Date   ALT 46 (H) 08/03/2022   AST 28 08/03/2022   ALKPHOS 47 08/03/2022   BILITOT 0.8 08/03/2022   Lab Results  Component Value Date   CHOL 261 (H) 01/01/2020   TRIG 89 03/31/2022    Lab Results  Component Value Date   HGBA1C 5.6 04/01/2022   Magnesium 1.9 Assessment & Plan    1.  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  At that time she required lidocaine therapy was advised to quit drinking.  LV function was initially normal and follow-up treadmill was also nonischemic.  In September 2023, she was hospitalized again with nausea and vomiting was treated with droperidol resulting in ventricular fibrillation requiring defibrillation and intubation.  She was noted to be COVID-positive at that time.  Despite 2-3 hospitalizations for nausea and vomiting in December 2023 and January 2024, with intermittent Phenergan use following dose hospitalizations, her QTc is stable today.  She has not been taking her Mag-Ox and I checked labs today and encouraged her to resume taking Mag-Ox 400 mg daily (magnesium 1.9  today).  Potassium was normal today at 4.5.  Again stressed the importance of complete alcohol cessation to break the cycle of gastritis, nausea, vomiting, electrolyte disturbance, prolonged QT, and ventricular arrhythmias.  She is currently drinking at least 2, 24 ounce natural ice beers a week.  I also stressed the importance of avoidance of QT prolonging antiemetics.  2.  Nonischemic cardiomyopathy/chronic heart failure with improved ejection fraction: EF was 30-40% in the setting of ventricular fibrillation arrest in September 2023 however, echo 2 days ago showed improvement to 60-65% without regional wall motion abnormalities.  Again stressed the importance of alcohol cessation.  Euvolemic on examination.  She was not previously a candidate for GDMT secondary to relative hypotension.  3.  Alcohol abuse/alcoholic gastritis: See 1.  Complete alcohol cessation advised.  4.  Tobacco abuse: Still smoking about a pack  of cigarettes per week.  Complete cessation advised.  5.  Hypokalemia/hypomagnesemia: Potassium 4.5 with magnesium 1.9 today.  Encouraged to take her magnesium oxide as prescribed.  6.  Disposition: Follow-up in 6 months or sooner if necessary.  Murray Hodgkins, NP 08/27/2022, 4:00 PM

## 2022-08-30 ENCOUNTER — Other Ambulatory Visit: Payer: Self-pay | Admitting: *Deleted

## 2022-08-30 MED ORDER — MAGNESIUM OXIDE -MG SUPPLEMENT 400 (240 MG) MG PO TABS: 400.0000 mg | ORAL_TABLET | Freq: Every day | ORAL | 11 refills | Status: DC

## 2022-10-19 ENCOUNTER — Other Ambulatory Visit: Payer: Medicaid Other

## 2022-10-19 ENCOUNTER — Ambulatory Visit: Payer: Medicaid Other | Admitting: Internal Medicine

## 2022-11-18 MED FILL — Iron Sucrose Inj 20 MG/ML (Fe Equiv): INTRAVENOUS | Qty: 10 | Status: AC

## 2022-11-19 ENCOUNTER — Inpatient Hospital Stay: Payer: Medicaid Other | Attending: Internal Medicine

## 2022-11-19 ENCOUNTER — Inpatient Hospital Stay: Payer: Medicaid Other

## 2022-11-19 ENCOUNTER — Encounter: Payer: Self-pay | Admitting: Medical Oncology

## 2022-11-19 ENCOUNTER — Inpatient Hospital Stay (HOSPITAL_BASED_OUTPATIENT_CLINIC_OR_DEPARTMENT_OTHER): Payer: Medicaid Other | Admitting: Medical Oncology

## 2022-11-19 VITALS — BP 123/87 | HR 64 | Temp 98.3°F | Wt 115.0 lb

## 2022-11-19 DIAGNOSIS — D649 Anemia, unspecified: Secondary | ICD-10-CM | POA: Diagnosis not present

## 2022-11-19 DIAGNOSIS — D708 Other neutropenia: Secondary | ICD-10-CM

## 2022-11-19 DIAGNOSIS — Z8679 Personal history of other diseases of the circulatory system: Secondary | ICD-10-CM | POA: Diagnosis not present

## 2022-11-19 DIAGNOSIS — F17210 Nicotine dependence, cigarettes, uncomplicated: Secondary | ICD-10-CM | POA: Insufficient documentation

## 2022-11-19 DIAGNOSIS — D696 Thrombocytopenia, unspecified: Secondary | ICD-10-CM

## 2022-11-19 DIAGNOSIS — E611 Iron deficiency: Secondary | ICD-10-CM | POA: Diagnosis not present

## 2022-11-19 DIAGNOSIS — Z862 Personal history of diseases of the blood and blood-forming organs and certain disorders involving the immune mechanism: Secondary | ICD-10-CM | POA: Insufficient documentation

## 2022-11-19 DIAGNOSIS — F1090 Alcohol use, unspecified, uncomplicated: Secondary | ICD-10-CM | POA: Insufficient documentation

## 2022-11-19 DIAGNOSIS — R918 Other nonspecific abnormal finding of lung field: Secondary | ICD-10-CM | POA: Diagnosis not present

## 2022-11-19 DIAGNOSIS — D709 Neutropenia, unspecified: Secondary | ICD-10-CM | POA: Insufficient documentation

## 2022-11-19 DIAGNOSIS — K7030 Alcoholic cirrhosis of liver without ascites: Secondary | ICD-10-CM

## 2022-11-19 DIAGNOSIS — N926 Irregular menstruation, unspecified: Secondary | ICD-10-CM | POA: Insufficient documentation

## 2022-11-19 LAB — COMPREHENSIVE METABOLIC PANEL
ALT: 22 U/L (ref 0–44)
AST: 57 U/L — ABNORMAL HIGH (ref 15–41)
Albumin: 4 g/dL (ref 3.5–5.0)
Alkaline Phosphatase: 68 U/L (ref 38–126)
Anion gap: 13 (ref 5–15)
BUN: 10 mg/dL (ref 6–20)
CO2: 20 mmol/L — ABNORMAL LOW (ref 22–32)
Calcium: 8.8 mg/dL — ABNORMAL LOW (ref 8.9–10.3)
Chloride: 102 mmol/L (ref 98–111)
Creatinine, Ser: 0.55 mg/dL (ref 0.44–1.00)
GFR, Estimated: >60 mL/min (ref >60)
Glucose, Bld: 85 mg/dL (ref 70–99)
Potassium: 4 mmol/L (ref 3.5–5.1)
Sodium: 135 mmol/L (ref 135–145)
Total Bilirubin: 0.6 mg/dL (ref 0.3–1.2)
Total Protein: 7.8 g/dL (ref 6.5–8.1)

## 2022-11-19 LAB — CBC WITH DIFFERENTIAL/PLATELET
Abs Immature Granulocytes: 0.01 10*3/uL (ref 0.00–0.07)
Basophils Absolute: 0 10*3/uL (ref 0.0–0.1)
Basophils Relative: 1 %
Eosinophils Absolute: 0 10*3/uL (ref 0.0–0.5)
Eosinophils Relative: 2 %
HCT: 29.5 % — ABNORMAL LOW (ref 36.0–46.0)
Hemoglobin: 9.1 g/dL — ABNORMAL LOW (ref 12.0–15.0)
Immature Granulocytes: 0 %
Lymphocytes Relative: 31 %
Lymphs Abs: 0.7 10*3/uL (ref 0.7–4.0)
MCH: 23.5 pg — ABNORMAL LOW (ref 26.0–34.0)
MCHC: 30.8 g/dL (ref 30.0–36.0)
MCV: 76.2 fL — ABNORMAL LOW (ref 80.0–100.0)
Monocytes Absolute: 0.3 10*3/uL (ref 0.1–1.0)
Monocytes Relative: 15 %
Neutro Abs: 1.1 10*3/uL — ABNORMAL LOW (ref 1.7–7.7)
Neutrophils Relative %: 51 %
Platelets: 102 10*3/uL — ABNORMAL LOW (ref 150–400)
RBC: 3.87 MIL/uL (ref 3.87–5.11)
RDW: 19.2 % — ABNORMAL HIGH (ref 11.5–15.5)
WBC: 2.3 10*3/uL — ABNORMAL LOW (ref 4.0–10.5)
nRBC: 0 % (ref 0.0–0.2)

## 2022-11-19 LAB — FERRITIN: Ferritin: 10 ng/mL — ABNORMAL LOW (ref 11–307)

## 2022-11-19 LAB — IRON AND TIBC
Iron: 32 ug/dL (ref 28–170)
Saturation Ratios: 7 % — ABNORMAL LOW (ref 10.4–31.8)
TIBC: 463 ug/dL — ABNORMAL HIGH (ref 250–450)
UIBC: 431 ug/dL

## 2022-11-19 LAB — LACTATE DEHYDROGENASE: LDH: 156 U/L (ref 98–192)

## 2022-11-19 NOTE — Progress Notes (Signed)
[facility] OFFICE PROGRESS NOTE  Patient Care Team: [facility], [facility] as PCP - General Iran Ouch, MD as PCP - Cardiology (Cardiology) Earna Coder, MD as Consulting Physician (Internal Medicine)   SUMMARY OF ONCOLOGIC HISTORY:  # NOV 2015 WBC- 2.2/ANC- 1200/ALC-500; [name]; 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 Normal cytogenetics  # hx of SVT likely secondary to electrolyte disturbance. S/p evaluation with cardiology.  # OCT 2016- Bil indeterminate lung nodules-CT  In July 2017- Improving.   # Hx of Kawasaki disease [age 27M & 13y]  # Alcohol abuse/Smoking  INTERVAL HISTORY: Ambulating independently.  Alone.  A pleasant 42 year-old African-American female patient with above history of chronic mild leukopenia/neutropenia; history of alcoholism is here for follow-up.  Today she states that she is doing ok. She has not had any recent health issues that she can think of although I see she has been to the ED twice since her last visit in January for cyclic vomiting syndrome. She continues to have some chronic fatigue but denies any bleeding or bruising episodes. She unfortunately continues to drink alcohol.  She also smokes.   No weight loss or night sweats.    Review of Systems  Constitutional:  Negative for chills, diaphoresis, fever, [name], hemoptysis, sputum production, [name], palpitations, orthopnea and leg swelling.  Gastrointestinal:  Negative for abdominal pain, blood in stool, constipation, diarrhea, heartburn, melena, nausea and vomiting.  Genitourinary:  Negative for dysuria, frequency and urgency.   Musculoskeletal:  Negative for back pain and joint pain.  Skin: Negative.  Negative for itching and rash.  Neurological:  Negative for dizziness, tingling, focal weakness, weakness and headaches.  Endo/Heme/Allergies:  Does not bruise/bleed easily.  Psychiatric/Behavioral:  Negative for depression. The patient is not nervous/anxious and does not have insomnia.     PAST MEDICAL HISTORY :  Past Medical History:  Diagnosis Date   Asthma    Cardiac arrest (HCC)    ETOH abuse    H/O Kawasaki's disease    as 51 month old child and at 66 years   History of anemia    History of blood transfusion 1984   Hypokalemia    Hypomagnesemia    a. in the setting of n/v->gastritis/etoh.   Leukopenia    Neutropenia (HCC)    NICM (nonischemic cardiomyopathy) (HCC)    a. 06/2021 Echo: EF 50-55%, [name]; b. 09/2021 ETT: Ex time 6:19, [name], [name]; c. 03/2022 Echo: EF 35-40%, [name]; d. 08/2022 Echo: EF 60-65%, no rwma, [name].   NSVT (nonsustained ventricular tachycardia) (HCC)    a. Noted during admission 06/2021 assoc w/ palpitations and presyncope in the setting of profound QT prolongation/gastritis.   Tobacco abuse    Transaminitis     PAST SURGICAL HISTORY :   Past Surgical History:  Procedure Laterality Date   ESOPHAGOGASTRODUODENOSCOPY N/A 10/29/2020   Procedure: ESOPHAGOGASTRODUODENOSCOPY (EGD);  Surgeon: Wyline Mood, MD;  Location: [facility] ENDOSCOPY;  Service: Gastroenterology;  [name];   ESOPHAGOGASTRODUODENOSCOPY (EGD) WITH PROPOFOL N/A 03/14/2020   Procedure: ESOPHAGOGASTRODUODENOSCOPY (EGD) WITH PROPOFOL;  Surgeon: Pasty Spillers, MD;  [name];  Service: Endoscopy;  [name];   ESOPHAGOGASTRODUODENOSCOPY (EGD) WITH PROPOFOL N/A 02/25/2021   Procedure: ESOPHAGOGASTRODUODENOSCOPY (EGD) WITH PROPOFOL;  Surgeon: Pasty Spillers, MD;  [name];  Service: Endoscopy;  [name];    ESOPHAGOGASTRODUODENOSCOPY (EGD) WITH PROPOFOL N/A 06/25/2022   Procedure: ESOPHAGOGASTRODUODENOSCOPY (EGD) WITH PROPOFOL;  Surgeon: Jaynie Collins, DO;  Location: [facility] ENDOSCOPY;  Service: Gastroenterology;  [name];    FAMILY HISTORY :   Family History  Problem Relation Age of Onset   Lupus Mother    Prostate cancer Father    Cancer Paternal Aunt        Breast    Cancer Paternal Aunt        Breast     SOCIAL HISTORY:   Social History   Tobacco Use   Smoking status: Every Day    Packs/day: 0.25    Years: 10.00    Additional pack years: 0.00    Total pack years: 2.50    Types: Cigarettes   Smokeless tobacco: Never   Tobacco comments:    05/27/22 3 cig per day   Vaping Use   Vaping Use: Never used  Substance Use Topics   Alcohol use: Yes    Alcohol/week: 61.0 standard drinks of alcohol    Types: 21 Cans of beer, [name]; 05/27/22 approx 2-3 beers week for the last 2 months   Drug use: No    ALLERGIES:  is allergic to inapsine [droperidol] and zofran [ondansetron].  MEDICATIONS:  Current Outpatient Medications  Medication Sig Dispense Refill   DEPO-PROVERA 150 MG/ML injection Inject 150 mg into the muscle every 3 (three) months.     Multiple Vitamin (MULTIVITAMIN WITH MINERALS) TABS tablet Take 1 tablet by mouth daily. 30 tablet 0   pantoprazole (PROTONIX) 40 MG tablet Take 1 tablet (40 mg total) by mouth daily. 30 tablet 2   desvenlafaxine (PRISTIQ) 50 MG 24 hr tablet Take 50 mg by mouth daily. (Patient not taking: Reported on 08/27/2022)     ferrous sulfate 325 (65 FE) MG tablet Take 1 tablet (325 mg total) by mouth daily. (Patient not taking: Reported on 08/27/2022) 30 tablet 2   magnesium oxide (MAG-OX) 400 (240 Mg) MG tablet Take 1 tablet (400 mg total) by mouth daily. (Patient not taking: Reported on 11/19/2022) 30 tablet 11   naltrexone (DEPADE) 50 MG tablet Take 50 mg by mouth daily. (Patient not  taking: Reported on 08/27/2022)     No current facility-administered medications for this visit.    PHYSICAL EXAMINATION: ECOG PERFORMANCE STATUS: 0 - Asymptomatic  BP 123/87 (BP Location: Right Arm, Patient Position: Sitting)   Pulse 64   Temp 98.3 F (36.8 C) (Tympanic)   Wt 115 lb (52.2 kg)   SpO2 100%   BMI 21.73 kg/m   Filed Weights   11/19/22 1450  Weight: 115 lb (52.2 kg)    Physical Exam HENT:     Head: Normocephalic and atraumatic.     Mouth/Throat:     Pharynx: No oropharyngeal exudate.  Eyes:     Pupils: Pupils are equal, round, and reactive to light.  Cardiovascular:     Rate and Rhythm: Normal rate and regular rhythm.  Pulmonary:     Effort: No respiratory distress.     Breath sounds: No wheezing.  Abdominal:     General: Bowel sounds are normal. There is no distension.     Palpations: Abdomen is soft. There  is no mass.     Tenderness: There is no abdominal tenderness. There is no guarding or rebound.  Musculoskeletal:        General: No tenderness. Normal range of motion.     Cervical back: Normal range of motion and neck supple.  Skin:    General: Skin is warm.  Neurological:     Mental Status: She is alert and oriented to person, place, and time.  Psychiatric:        Mood and Affect: Affect normal.      LABORATORY DATA:  I have reviewed the data as listed    Component Value Date/Time   NA 135 11/19/2022 1436   NA 141 02/05/2021 1334   K 4.0 11/19/2022 1436   CL 102 11/19/2022 1436   CO2 20 (L) 11/19/2022 1436   GLUCOSE 85 11/19/2022 1436   BUN 10 11/19/2022 1436   BUN 7 02/05/2021 1334   CREATININE 0.55 11/19/2022 1436   CALCIUM 8.8 (L) 11/19/2022 1436   PROT 7.8 11/19/2022 1436   PROT 8.0 02/05/2021 1334   ALBUMIN 4.0 11/19/2022 1436   ALBUMIN 4.9 (H) 02/05/2021 1334   AST 57 (H) 11/19/2022 1436   ALT 22 11/19/2022 1436   ALKPHOS 68 11/19/2022 1436   BILITOT 0.6 11/19/2022 1436   BILITOT 0.2 02/05/2021 1334   GFRNONAA >60  11/19/2022 1436   GFRAA >60 11/15/2019 2054    No results found for: "SPEP", "UPEP"  Lab Results  Component Value Date   WBC 2.3 (L) 11/19/2022   NEUTROABS 1.1 (L) 11/19/2022   HGB 9.1 (L) 11/19/2022   HCT 29.5 (L) 11/19/2022   MCV 76.2 (L) 11/19/2022   PLT 102 (L) 11/19/2022      Chemistry      Component Value Date/Time   NA 135 11/19/2022 1436   NA 141 02/05/2021 1334   K 4.0 11/19/2022 1436   CL 102 11/19/2022 1436   CO2 20 (L) 11/19/2022 1436   BUN 10 11/19/2022 1436   BUN 7 02/05/2021 1334   CREATININE 0.55 11/19/2022 1436   GLU 76 01/01/2020 1447      Component Value Date/Time   CALCIUM 8.8 (L) 11/19/2022 1436   ALKPHOS 68 11/19/2022 1436   AST 57 (H) 11/19/2022 1436   ALT 22 11/19/2022 1436   BILITOT 0.6 11/19/2022 1436   BILITOT 0.2 02/05/2021 1334       RADIOGRAPHIC STUDIES: I have personally reviewed the radiological images as listed and agreed with the findings in the report. No results found.   ASSESSMENT & PLAN:  Other neutropenia (HCC) Intermittent neutropenia white count  2-3 ANC [phone]; today absolute neutrophil count is  1.1  hemoglobin 9.1- platelets 102. Asymptomatic/benign ethnic neutropenia/? Alcohol.  Previous bone marrow biopsy negative. She is not currently taking her vitamin supplements and has a history of ETOH abuse. I have recommended that she take her supplements as previously advised. She is agreeable.    Anemia-Hb-9.1 on Depot [menstrual cycles " all over the place"]- Iron sat 7%; Ferritin -34 [PCP]-patient not taking gentle iron due to constipation- had liquid iron script but has not started. Encouraged her to start this along with her folic acid supplement. Iron studies pending.   # Heavy alcohol-Discussed cessation patient not interested.   DISPOSITION: No IV iron today RTC 2 months lab only D1( CBC w/, CMP, retic, iron, ferritin, B12, folate) RTC 2 months D2 MD, possibly Venofer -[place]     Rushie Chestnut, PA-C 11/19/2022  5:07 PM

## 2023-01-12 ENCOUNTER — Other Ambulatory Visit: Payer: Self-pay | Admitting: Nurse Practitioner

## 2023-01-12 DIAGNOSIS — K7460 Unspecified cirrhosis of liver: Secondary | ICD-10-CM

## 2023-01-19 ENCOUNTER — Ambulatory Visit
Admission: RE | Admit: 2023-01-19 | Discharge: 2023-01-19 | Disposition: A | Discharge status: Home / Self Care | Payer: Medicaid Other | Source: Ambulatory Visit | Attending: Nurse Practitioner | Admitting: Nurse Practitioner

## 2023-01-19 DIAGNOSIS — K7460 Unspecified cirrhosis of liver: Secondary | ICD-10-CM | POA: Insufficient documentation

## 2023-01-24 ENCOUNTER — Inpatient Hospital Stay: Payer: Medicaid Other | Attending: Internal Medicine

## 2023-01-24 ENCOUNTER — Other Ambulatory Visit: Payer: Self-pay

## 2023-01-24 DIAGNOSIS — K760 Fatty (change of) liver, not elsewhere classified: Secondary | ICD-10-CM | POA: Diagnosis not present

## 2023-01-24 DIAGNOSIS — D649 Anemia, unspecified: Secondary | ICD-10-CM | POA: Diagnosis not present

## 2023-01-24 DIAGNOSIS — D708 Other neutropenia: Secondary | ICD-10-CM | POA: Insufficient documentation

## 2023-01-24 DIAGNOSIS — E611 Iron deficiency: Secondary | ICD-10-CM

## 2023-01-24 DIAGNOSIS — D709 Neutropenia, unspecified: Secondary | ICD-10-CM | POA: Diagnosis present

## 2023-01-24 LAB — CBC WITH DIFFERENTIAL/PLATELET
Abs Immature Granulocytes: 0.01 10*3/uL (ref 0.00–0.07)
Basophils Absolute: 0 10*3/uL (ref 0.0–0.1)
Basophils Relative: 0 %
Eosinophils Absolute: 0.1 10*3/uL (ref 0.0–0.5)
Eosinophils Relative: 3 %
HCT: 31.8 % — ABNORMAL LOW (ref 36.0–46.0)
Hemoglobin: 10.3 g/dL — ABNORMAL LOW (ref 12.0–15.0)
Immature Granulocytes: 0 %
Lymphocytes Relative: 20 %
Lymphs Abs: 0.5 10*3/uL — ABNORMAL LOW (ref 0.7–4.0)
MCH: 25.6 pg — ABNORMAL LOW (ref 26.0–34.0)
MCHC: 32.4 g/dL (ref 30.0–36.0)
MCV: 79.1 fL — ABNORMAL LOW (ref 80.0–100.0)
Monocytes Absolute: 0.3 10*3/uL (ref 0.1–1.0)
Monocytes Relative: 10 %
Neutro Abs: 1.6 10*3/uL — ABNORMAL LOW (ref 1.7–7.7)
Neutrophils Relative %: 67 %
Platelets: 138 10*3/uL — ABNORMAL LOW (ref 150–400)
RBC: 4.02 MIL/uL (ref 3.87–5.11)
RDW: 19.4 % — ABNORMAL HIGH (ref 11.5–15.5)
WBC: 2.5 10*3/uL — ABNORMAL LOW (ref 4.0–10.5)
nRBC: 0 % (ref 0.0–0.2)

## 2023-01-24 LAB — RETIC PANEL
Immature Retic Fract: 17.4 % — ABNORMAL HIGH (ref 2.3–15.9)
RBC.: 3.98 MIL/uL (ref 3.87–5.11)
Retic Count, Absolute: 57.7 10*3/uL (ref 19.0–186.0)
Retic Ct Pct: 1.5 % (ref 0.4–3.1)
Reticulocyte Hemoglobin: 29.1 pg (ref >27.9)

## 2023-01-24 LAB — COMPREHENSIVE METABOLIC PANEL
ALT: 18 U/L (ref 0–44)
AST: 47 U/L — ABNORMAL HIGH (ref 15–41)
Albumin: 4.2 g/dL (ref 3.5–5.0)
Alkaline Phosphatase: 63 U/L (ref 38–126)
Anion gap: 10 (ref 5–15)
BUN: 10 mg/dL (ref 6–20)
CO2: 19 mmol/L — ABNORMAL LOW (ref 22–32)
Calcium: 8.8 mg/dL — ABNORMAL LOW (ref 8.9–10.3)
Chloride: 101 mmol/L (ref 98–111)
Creatinine, Ser: 0.64 mg/dL (ref 0.44–1.00)
GFR, Estimated: >60 mL/min (ref >60)
Glucose, Bld: 84 mg/dL (ref 70–99)
Potassium: 3.5 mmol/L (ref 3.5–5.1)
Sodium: 130 mmol/L — ABNORMAL LOW (ref 135–145)
Total Bilirubin: 0.5 mg/dL (ref 0.3–1.2)
Total Protein: 7.9 g/dL (ref 6.5–8.1)

## 2023-01-24 LAB — FERRITIN: Ferritin: 11 ng/mL (ref 11–307)

## 2023-01-24 LAB — IRON AND TIBC
Iron: 47 ug/dL (ref 28–170)
Saturation Ratios: 10 % — ABNORMAL LOW (ref 10.4–31.8)
TIBC: 484 ug/dL — ABNORMAL HIGH (ref 250–450)
UIBC: 437 ug/dL

## 2023-01-24 LAB — FOLATE: Folate: >40 ng/mL (ref >5.9)

## 2023-01-24 LAB — VITAMIN B12: Vitamin B-12: 292 pg/mL (ref 180–914)

## 2023-01-24 LAB — LACTATE DEHYDROGENASE: LDH: 134 U/L (ref 98–192)

## 2023-01-26 ENCOUNTER — Inpatient Hospital Stay: Payer: Medicaid Other

## 2023-01-26 ENCOUNTER — Inpatient Hospital Stay: Payer: Medicaid Other | Admitting: Internal Medicine

## 2023-01-26 ENCOUNTER — Encounter: Payer: Self-pay | Admitting: Internal Medicine

## 2023-01-26 VITALS — BP 126/81 | HR 85 | Temp 98.5°F | Ht 61.0 in | Wt 115.0 lb

## 2023-01-26 DIAGNOSIS — D708 Other neutropenia: Secondary | ICD-10-CM

## 2023-01-26 MED ORDER — FOLIC ACID 1 MG PO TABS: 1.0000 mg | ORAL_TABLET | Freq: Every day | ORAL | 1 refills | Status: DC

## 2023-01-26 MED ORDER — FERROUS SULFATE 220 (44 FE) MG/5ML PO SOLN: 220.0000 mg | Freq: Every day | ORAL | 3 refills | Status: DC

## 2023-01-26 NOTE — Progress Notes (Signed)
[facility] OFFICE PROGRESS NOTE  Patient Care Team: Center, [facility] as PCP - General Iran Ouch, MD as PCP - Cardiology (Cardiology) Earna Coder, MD as Consulting Physician (Internal Medicine)   SUMMARY OF ONCOLOGIC HISTORY:  # NOV 2015 WBC- 2.2/ANC- 1200/ALC-500; [name]; 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 Normal cytogenetics  # hx of SVT likely secondary to electrolyte disturbance. S/p evaluation with cardiology.  # OCT 2016- Bil indeterminate lung nodules-CT  In July 2017- Improving.   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  Alone.  A pleasant 42 year-old African-American female patient with above history of chronic mild leukopenia/neutropenia; history of alcoholism is here for follow-up.  Patient unfortunately continues to drink alcohol.  She also smokes.   No weight loss or night sweats.   She does complain of hot flashes-she is on Lupron. Menstrual cycles- none at this time.   Review of Systems  Constitutional:  Negative for chills, diaphoresis, fever, [name], hemoptysis, sputum production, [name], palpitations, orthopnea and leg swelling.  Gastrointestinal:  Negative for abdominal pain, blood in stool, constipation, diarrhea, heartburn, melena, nausea and vomiting.  Genitourinary:  Negative for dysuria, frequency and urgency.  Musculoskeletal:  Negative for back pain and joint pain.  Skin: Negative.  Negative for itching and rash.  Neurological:  Negative for dizziness, tingling, focal weakness, weakness and headaches.   Endo/Heme/Allergies:  Does not bruise/bleed easily.  Psychiatric/Behavioral:  Negative for depression. The patient is not nervous/anxious and does not have insomnia.     PAST MEDICAL HISTORY :  Past Medical History:  Diagnosis Date   Asthma    Cardiac arrest (HCC)    ETOH abuse    H/O Kawasaki's disease    as 4 month old child and at 22 years   History of anemia    History of blood transfusion 1984   Hypokalemia    Hypomagnesemia    a. in the setting of n/v->gastritis/etoh.   Leukopenia    Neutropenia (HCC)    NICM (nonischemic cardiomyopathy) (HCC)    a. 06/2021 Echo: EF 50-55%, [name]; b. 09/2021 ETT: Ex time 6:19, [name], [name]; c. 03/2022 Echo: EF 35-40%, [name]; d. 08/2022 Echo: EF 60-65%, no rwma, [name].   NSVT (nonsustained ventricular tachycardia) (HCC)    a. Noted during admission 06/2021 assoc w/ palpitations and presyncope in the setting of profound QT prolongation/gastritis.   Tobacco abuse    Transaminitis     PAST SURGICAL HISTORY :   Past Surgical History:  Procedure Laterality Date   ESOPHAGOGASTRODUODENOSCOPY N/A 10/29/2020   Procedure: ESOPHAGOGASTRODUODENOSCOPY (EGD);  Surgeon: Wyline Mood, MD;  Location: [facility] ENDOSCOPY;  Service: Gastroenterology;  [name];   ESOPHAGOGASTRODUODENOSCOPY (EGD) WITH PROPOFOL N/A 03/14/2020   Procedure: ESOPHAGOGASTRODUODENOSCOPY (EGD) WITH PROPOFOL;  Surgeon: Pasty Spillers, MD;  [name];  Service: Endoscopy;  [name];   ESOPHAGOGASTRODUODENOSCOPY (EGD) WITH PROPOFOL N/A 02/25/2021   Procedure: ESOPHAGOGASTRODUODENOSCOPY (EGD) WITH PROPOFOL;  Surgeon: Pasty Spillers, MD;  [name];  Service: Endoscopy;  [name];  ESOPHAGOGASTRODUODENOSCOPY (EGD) WITH PROPOFOL N/A 06/25/2022   Procedure: ESOPHAGOGASTRODUODENOSCOPY (EGD) WITH PROPOFOL;  Surgeon: Jaynie Collins, DO;  Location: [facility] ENDOSCOPY;  Service:  Gastroenterology;  [name];    FAMILY HISTORY :   Family History  Problem Relation Age of Onset   Lupus Mother    Prostate cancer Father    Cancer Paternal Aunt        Breast    Cancer Paternal Aunt        Breast     SOCIAL HISTORY:   Social History   Tobacco Use   Smoking status: Every Day    Current packs/day: 0.25    Average packs/day: 0.3 packs/day for 10.0 years (2.5 ttl pk-yrs)    Types: Cigarettes   Smokeless tobacco: Never   Tobacco comments:    05/27/22 3 cig per day   Vaping Use   Vaping status: Never Used  Substance Use Topics   Alcohol use: Yes    Alcohol/week: 61.0 standard drinks of alcohol    Types: 21 Cans of beer, [name]; 05/27/22 approx 2-3 beers week for the last 2 months   Drug use: No    ALLERGIES:  is allergic to inapsine [droperidol] and zofran [ondansetron].  MEDICATIONS:  Current Outpatient Medications  Medication Sig Dispense Refill   DEPO-PROVERA 150 MG/ML injection Inject 150 mg into the muscle every 3 (three) months.     Ferrous Sulfate (IRON PO) Take by mouth every other day.     ferrous sulfate 220 (44 Fe) MG/5ML solution Take 5 mLs (220 mg total) by mouth daily. 200 mL 3   Multiple Vitamin (MULTIVITAMIN WITH MINERALS) TABS tablet Take 1 tablet by mouth daily. 30 tablet 0   naltrexone (DEPADE) 50 MG tablet Take 50 mg by mouth daily.     pantoprazole (PROTONIX) 40 MG tablet Take 1 tablet (40 mg total) by mouth daily. 30 tablet 2   desvenlafaxine (PRISTIQ) 50 MG 24 hr tablet Take 50 mg by mouth daily. (Patient not taking: Reported on 08/27/2022)     folic acid (FOLVITE) 1 MG tablet Take 1 tablet (1 mg total) by mouth daily. 90 tablet 1   magnesium oxide (MAG-OX) 400 (240 Mg) MG tablet Take 1 tablet (400 mg total) by mouth daily. (Patient not taking: Reported on 11/19/2022) 30 tablet 11   No current facility-administered medications for this visit.    PHYSICAL EXAMINATION: ECOG  PERFORMANCE STATUS: 0 - Asymptomatic  BP 126/81 (BP Location: Right Arm, Patient Position: Sitting, Cuff Size: Normal)   Pulse 85   Temp 98.5 F (36.9 C) (Tympanic)   Ht 5\' 1"  (1.549 m)   Wt 115 lb (52.2 kg)   SpO2 100%   BMI 21.73 kg/m   Filed Weights   01/26/23 1422  Weight: 115 lb (52.2 kg)     Physical Exam HENT:     Head: Normocephalic and atraumatic.     Mouth/Throat:     Pharynx: No oropharyngeal exudate.  Eyes:     Pupils: Pupils are equal, round, and reactive to light.  Cardiovascular:     Rate and Rhythm: Normal rate and regular rhythm.  Pulmonary:     Effort: No respiratory distress.     Breath sounds: No wheezing.  Abdominal:     General: Bowel sounds are normal. There is no distension.     Palpations: Abdomen is soft. There is no mass.     Tenderness: There is  no abdominal tenderness. There is no guarding or rebound.  Musculoskeletal:        General: No tenderness. Normal range of motion.     Cervical back: Normal range of motion and neck supple.  Skin:    General: Skin is warm.  Neurological:     Mental Status: She is alert and oriented to person, place, and time.  Psychiatric:        Mood and Affect: Affect normal.      LABORATORY DATA:  I have reviewed the data as listed    Component Value Date/Time   NA 130 (L) 01/24/2023 1452   NA 141 02/05/2021 1334   K 3.5 01/24/2023 1452   CL 101 01/24/2023 1452   CO2 19 (L) 01/24/2023 1452   GLUCOSE 84 01/24/2023 1452   BUN 10 01/24/2023 1452   BUN 7 02/05/2021 1334   CREATININE 0.64 01/24/2023 1452   CALCIUM 8.8 (L) 01/24/2023 1452   PROT 7.9 01/24/2023 1452   PROT 8.0 02/05/2021 1334   ALBUMIN 4.2 01/24/2023 1452   ALBUMIN 4.9 (H) 02/05/2021 1334   AST 47 (H) 01/24/2023 1452   ALT 18 01/24/2023 1452   ALKPHOS 63 01/24/2023 1452   BILITOT 0.5 01/24/2023 1452   BILITOT 0.2 02/05/2021 1334   GFRNONAA >60 01/24/2023 1452   GFRAA >60 11/15/2019 2054    No results found for: "SPEP",  "UPEP"  Lab Results  Component Value Date   WBC 2.5 (L) 01/24/2023   NEUTROABS 1.6 (L) 01/24/2023   HGB 10.3 (L) 01/24/2023   HCT 31.8 (L) 01/24/2023   MCV 79.1 (L) 01/24/2023   PLT 138 (L) 01/24/2023      Chemistry      Component Value Date/Time   NA 130 (L) 01/24/2023 1452   NA 141 02/05/2021 1334   K 3.5 01/24/2023 1452   CL 101 01/24/2023 1452   CO2 19 (L) 01/24/2023 1452   BUN 10 01/24/2023 1452   BUN 7 02/05/2021 1334   CREATININE 0.64 01/24/2023 1452   GLU 76 01/01/2020 1447      Component Value Date/Time   CALCIUM 8.8 (L) 01/24/2023 1452   ALKPHOS 63 01/24/2023 1452   AST 47 (H) 01/24/2023 1452   ALT 18 01/24/2023 1452   BILITOT 0.5 01/24/2023 1452   BILITOT 0.2 02/05/2021 1334       RADIOGRAPHIC STUDIES: I have personally reviewed the radiological images as listed and agreed with the findings in the report. No results found.   ASSESSMENT & PLAN:  Other neutropenia (HCC) # Intermittent neutropenia white count  2-3 ANC [phone]; today absolute neutrophil count is  1.7  hemoglobin 10.2- platelets 183 Asymptomatic/benign ethnic neutropenia/? Alcohol.  Previous bone marrow biopsy negative.  # Mild Anemia-Hb-10.2 on Depot [menstrual cycles- none]- JULY 2024- Iron sat 10%; Ferritin -11-  [Hx of constipation; difficulty swallowing]- will send liquid Iron script. Recommend IV venofer, but per pt pref- HOLD venofer today. Pt will call to re-schedule. EGD jan 2023- hopital- NED. Refer to [facility]- GI re: anemia  # hx of SVT- ? Sec to electrolyte abnormalites [s/p holter]-follow-up with cardiology.  # Heavy alcohol-again discussed cessation patient not interested.[place] July 2024- fatty liver.   # DISPOSITION: # Refer to [facility] GI re: anemia; colonoscopy # HOLD venofer today [pt pref; will call to re-schedule] # follow up in 6  months- MD;2-3 days prior  -labs-cbc/cmp/ldh;folic acid; b12  iron studies;ferritin; possible venofer--Dr.B  Cc; Risk manager.  Earna Coder, MD 01/26/2023 3:23 PM

## 2023-01-26 NOTE — Assessment & Plan Note (Addendum)
#   Intermittent neutropenia white count  2-3 ANC [phone]; today absolute neutrophil count is  1.7  hemoglobin 10.2- platelets 183 Asymptomatic/benign ethnic neutropenia/? Alcohol.  Previous bone marrow biopsy negative.  # Mild Anemia-Hb-10.2 on Depot [menstrual cycles- none]- JULY 2024- Iron sat 10%; Ferritin -11-  [Hx of constipation; difficulty swallowing]- will send liquid Iron script. Recommend IV venofer, but per pt pref- HOLD venofer today. Pt will call to re-schedule. EGD jan 2023- hopital- NED. Refer to [facility]- GI re: anemia  # hx of SVT- ? Sec to electrolyte abnormalites [s/p holter]-follow-up with cardiology.  # Heavy alcohol-again discussed cessation patient not interested.[place] July 2024- fatty liver.   # DISPOSITION: # Refer to [facility]- GI re: anemia; colonoscopy # HOLD venofer today [pt pref; will call to re-schedule] # follow up in 6  months- MD;2-3 days prior  -labs-cbc/cmp/ldh;folic acid; b12  iron studies;ferritin; possible venofer--Dr.B  Cc; Risk manager.

## 2023-01-26 NOTE — Progress Notes (Signed)
No concerns today 

## 2023-03-01 ENCOUNTER — Ambulatory Visit: Payer: Medicaid Other | Admitting: Nurse Practitioner

## 2023-04-13 ENCOUNTER — Encounter: Payer: Self-pay | Admitting: Internal Medicine

## 2023-04-22 ENCOUNTER — Ambulatory Visit: Payer: Medicaid Other | Attending: Nurse Practitioner | Admitting: Nurse Practitioner

## 2023-04-22 ENCOUNTER — Encounter: Payer: Self-pay | Admitting: Nurse Practitioner

## 2023-04-22 VITALS — BP 106/74 | HR 68 | Ht 61.0 in | Wt 116.8 lb

## 2023-04-22 DIAGNOSIS — I428 Other cardiomyopathies: Secondary | ICD-10-CM

## 2023-04-22 DIAGNOSIS — F1010 Alcohol abuse, uncomplicated: Secondary | ICD-10-CM

## 2023-04-22 DIAGNOSIS — I5022 Chronic systolic (congestive) heart failure: Secondary | ICD-10-CM | POA: Diagnosis not present

## 2023-04-22 DIAGNOSIS — R9431 Abnormal electrocardiogram [ECG] [EKG]: Secondary | ICD-10-CM | POA: Diagnosis not present

## 2023-04-22 DIAGNOSIS — Z8674 Personal history of sudden cardiac arrest: Secondary | ICD-10-CM

## 2023-04-22 DIAGNOSIS — I4729 Other ventricular tachycardia: Secondary | ICD-10-CM

## 2023-04-22 DIAGNOSIS — I5032 Chronic diastolic (congestive) heart failure: Secondary | ICD-10-CM

## 2023-04-22 DIAGNOSIS — E876 Hypokalemia: Secondary | ICD-10-CM

## 2023-04-22 DIAGNOSIS — Z720 Tobacco use: Secondary | ICD-10-CM

## 2023-04-22 NOTE — Progress Notes (Signed)
Office Visit    Patient Name: Yvette [name] Date of Encounter: 04/22/2023  Primary Care Provider:  Center, [facility] Primary Cardiologist:  Yvette Bears, MD  Chief Complaint    42 y.o. female with history of tobacco alcohol abuse, alcoholic gastritis, persistent nausea vomiting, electrolyte abnormalities, prolonged QT, ventricular tachycardia, GERD, anemia, transaminitis, neutropenia, and Kawasaki disease, who presents for follow-up related to prolonged QT and ventricular arrhythmias.   Past Medical History    Past Medical History:  Diagnosis Date   Asthma    Cardiac arrest (HCC)    ETOH abuse    H/O Kawasaki's disease    as 60 month old child and at 44 years   History of anemia    History of blood transfusion 1984   Hypokalemia    Hypomagnesemia    a. in the setting of n/v->gastritis/etoh.   Leukopenia    Neutropenia (HCC)    NICM (nonischemic cardiomyopathy) (HCC)    a. 06/2021 Echo: EF 50-55%, [name]; b. 09/2021 ETT: Ex time 6:19, [name], [name]; c. 03/2022 Echo: EF 35-40%, [name]; d. 08/2022 Echo: EF 60-65%, no rwma, [name].   NSVT (nonsustained ventricular tachycardia) (HCC)    a. Noted during admission 06/2021 assoc w/ palpitations and presyncope in the setting of profound QT prolongation/gastritis.   Tobacco abuse    Transaminitis    Past Surgical History:  Procedure Laterality Date   ESOPHAGOGASTRODUODENOSCOPY N/A 10/29/2020   Procedure: ESOPHAGOGASTRODUODENOSCOPY (EGD);  Surgeon: Yvette Mood, MD;  Location: [facility] ENDOSCOPY;  Service: Gastroenterology;  [name];   ESOPHAGOGASTRODUODENOSCOPY (EGD) WITH PROPOFOL N/A 03/14/2020   Procedure: ESOPHAGOGASTRODUODENOSCOPY (EGD) WITH PROPOFOL;  Surgeon: Yvette Spillers, MD;  [name];  Service: Endoscopy;  [name];   ESOPHAGOGASTRODUODENOSCOPY (EGD) WITH PROPOFOL N/A 02/25/2021   Procedure: ESOPHAGOGASTRODUODENOSCOPY (EGD)  WITH PROPOFOL;  Surgeon: Yvette Spillers, MD;  [name];  Service: Endoscopy;  [name];   ESOPHAGOGASTRODUODENOSCOPY (EGD) WITH PROPOFOL N/A 06/25/2022   Procedure: ESOPHAGOGASTRODUODENOSCOPY (EGD) WITH PROPOFOL;  Surgeon: Yvette Collins, DO;  Location: [facility] ENDOSCOPY;  Service: Gastroenterology;  [name];    Allergies  Allergies  Allergen Reactions   Inapsine [Droperidol] Anaphylaxis   Zofran [Ondansetron] Other (See Comments)    Prolonged QT    History of Present Illness      42 y.o. y/o female with the above complex past medical history including tobacco abuse, alcohol abuse, alcoholic gastritis, persistent nausea vomiting, electrolyte abnormalities, prolonged QT, ventricular tachycardia, GERD, anemia, transaminitis, neutropenia, and Kawasaki disease.  She also has a family history of premature sudden cardiac death.  In the setting of alcoholism and alcoholic gastritis, she has a history of multiple admissions and ED visits in the setting of nausea, vomiting, hypokalemia, and hypomagnesemia.  She had normal EGDs in August 2022 in December 2023.  In December 2022, she was admitted with nausea, vomiting, and electrolyte abnormalities.  She was also being treated with Phenergan on top of usual home dose of Remeron.  In that setting, she was having frequent ventricular ectopy and runs of ventricular tachycardia with significant QT prolongation.  She required intravenous lidocaine.  Echo showed an EF of 50-55% and she was subsequently placed on beta-blocker therapy with recommendation for alcohol cessation.  Subsequent outpatient monitoring showed predominantly sinus rhythm and sinus tachycardia with an average heart rate of 106 bpm with rare PACs, PVCs, and 1 brief run of PSVT.  Exercise  treadmill testing in March 2023 was negative for ischemic EKG changes with exercise time of 6 minutes and 19 seconds.  In September 2023, she was readmitted with gastritis  and URI symptoms (found to be COVID-positive).  Following dose of droperidol in the emergency department, she became responsive and required intubation, CPR, and defibrillation in the setting of ventricular fibrillation.  Repeat echo showed EF of 35 to 40% with severe anterolateral and anterior hypokinesis.  She was noted to have prolonged QT on telemetry.  GDMT was limited by borderline hypotension and she was again counseled on the importance of alcohol cessation and avoidance of QT prolonging medications including antiemetics.  Follow-up echo in February 2024 showed EF of 60 to 65% without regional wall motion abnormalities.   Yvette [name] was last seen in cardiology clinic in February 2024, at which time QTc was stable.  She was still drinking 2, 24 ounce beers per week, and smoking cigarettes.  She has been followed by gastroenterology with abdominal ultrasound in July 2024 showing fatty liver.  She is pending EGD and colonoscopy in the setting of iron deficiency anemia.  She reports today that she has been been feeling well.  She denies any palpitations, presyncope, or syncope.  She has not had any recent bouts of gastritis/nausea/vomiting.  She continues to drink 2, 24 ounce beers every other day, while smoking 2 to 3 cigarettes/day.  She admits to currently being satisfied with her intake of substances, and is not looking to quit at this time.  She denies chest pain, dyspnea, PND, orthopnea, edema, or early satiety.  Home Medications    Current Outpatient Medications  Medication Sig Dispense Refill   DEPO-PROVERA 150 MG/ML injection Inject 150 mg into the muscle every 3 (three) months.     desvenlafaxine (PRISTIQ) 50 MG 24 hr tablet Take 50 mg by mouth daily.     ferrous sulfate 220 (44 Fe) MG/5ML solution Take 5 mLs (220 mg total) by mouth daily. 200 mL 3   folic acid (FOLVITE) 1 MG tablet Take 1 tablet (1 mg total) by mouth daily. 90 tablet 1   naltrexone (DEPADE) 50 MG tablet Take 50 mg by mouth  daily.     pantoprazole (PROTONIX) 40 MG tablet Take 1 tablet (40 mg total) by mouth daily. 30 tablet 2   Ferrous Sulfate (IRON PO) Take by mouth every other day. (Patient not taking: Reported on 04/22/2023)     magnesium oxide (MAG-OX) 400 (240 Mg) MG tablet Take 1 tablet (400 mg total) by mouth daily. (Patient not taking: Reported on 04/22/2023) 30 tablet 11   Multiple Vitamin (MULTIVITAMIN WITH MINERALS) TABS tablet Take 1 tablet by mouth daily. (Patient not taking: Reported on 04/22/2023) 30 tablet 0   No current facility-administered medications for this visit.     Review of Systems    She denies chest pain, palpitations, dyspnea, pnd, orthopnea, n, v, dizziness, syncope, edema, weight gain, or early satiety.  Occasionally notes a cramp in her calf-potentially more likely to occur on days she is drinking.  All other systems reviewed and are otherwise negative except as noted above.    Physical Exam    VS:  BP 106/74   Pulse 68   Ht 5\' 1"  (1.549 m)   Wt 116 lb 12.8 oz (53 kg)   SpO2 100%   BMI 22.07 kg/m  , BMI Body mass index is 22.07 kg/m.     GEN: Thin, in no acute distress. HEENT: normal. Neck: Supple,  no JVD, carotid bruits, or masses. Cardiac: RRR, no murmurs, rubs, or gallops. No clubbing, cyanosis, edema.  Radials 2+/PT 2+ and equal bilaterally.  Respiratory:  Respirations regular and unlabored, clear to auscultation bilaterally. GI: Soft, nontender, nondistended, BS + x 4. MS: no deformity or atrophy. Skin: warm and dry, no rash. Neuro:  Strength and sensation are intact. Psych: Normal affect.  Accessory Clinical Findings    ECG personally reviewed by me today - EKG Interpretation Date/Time:  Friday April 22 2023 13:20:01 EDT Ventricular Rate:  67 PR Interval:  140 QRS Duration:  80 QT Interval:  402 QTC Calculation: 424 R Axis:   89  Text Interpretation: Normal sinus rhythm Septal infarct , age undetermined Confirmed by Nicolasa Ducking [phone]) on  04/22/2023 1:36:19 PM  - no acute changes.  Lab Results  Component Value Date   WBC 2.5 (L) 01/24/2023   HGB 10.3 (L) 01/24/2023   HCT 31.8 (L) 01/24/2023   MCV 79.1 (L) 01/24/2023   PLT 138 (L) 01/24/2023   Lab Results  Component Value Date   CREATININE 0.64 01/24/2023   BUN 10 01/24/2023   NA 130 (L) 01/24/2023   K 3.5 01/24/2023   CL 101 01/24/2023   CO2 19 (L) 01/24/2023   Lab Results  Component Value Date   ALT 18 01/24/2023   AST 47 (H) 01/24/2023   ALKPHOS 63 01/24/2023   BILITOT 0.5 01/24/2023   Lab Results  Component Value Date   CHOL 261 (H) 01/01/2020   TRIG 89 03/31/2022    Lab Results  Component Value Date   HGBA1C 5.6 04/01/2022    Assessment & Plan    1.  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  She required lidocaine infusion at that time.  Recurrent ventricular arrhythmias and VF in the setting of similar presentation in September 2023.  Fortunately, though she continues to drink approximately 48 ounces of beer every other day, she has not had any recent bouts of nausea and vomiting, or required antiemetics.  QTc is normal today.  Again, strongly encouraged that she refrain from alcohol use.  She reports that she is now taking naltrexone 50 mg, 1-2 times a day, but is not interested in completely quitting at this time.  2.  Nonischemic cardiomyopathy/chronic heart failure with improved ejection fraction: EF was 30 to 40% in the setting of ventricular fibrillation arrest in September 2023 with subsequent improvement to 60-65% without regional wall motion abnormalities by echo in February 2024.  She is euvolemic on examination and asymptomatic at home.  As above, stressed the importance of tobacco and alcohol cessation.  She was not previously a candidate for GDMT secondary to relative  hypotension.  3.  Alcohol abuse/alcoholic gastritis: Currently not interested in quitting completely and satisfied with current efforts-drinking approximately 48 ounces of beer every other day.  Also using naltrexone.  Complete cessation advised.  4.  Tobacco use: Still smoking 2 to 3 cigarettes/day.  She is not contemplating quitting at this time.  Discussed the risks associated with tobacco abuse in the context of her prior and current illnesses.  Complete cessation advised.  5.  Hypokalemia/hypomagnesemia: Potassium was 3.5 in July.  Magnesium was 1.9 in February.  She has not been experiencing nausea or vomiting since then.  Occasional calf cramping, potentially related to intermittent electrolyte abnormalities.  Encouraged a diet  high in potassium rich foods (beans, legumes, green leafy vegetables, potatoes, citrus).  6.  Iron deficiency anemia: Pending EGD and colonoscopy in early November.  From a cardiac standpoint, she is low risk for complications though likely reasonable to monitor magnesium and potassium periprocedure secondary to prep and prior propensity toward hypokalemia.  7.  Disposition: Follow-up in 1 year or sooner if necessary.  Nicolasa Ducking, NP 04/22/2023, 2:06 PM

## 2023-04-22 NOTE — Patient Instructions (Signed)
Medication Instructions:  No changes at this time.   *If you need a refill on your cardiac medications before your next appointment, please call your pharmacy*   Lab Work: None  If you have labs (blood work) drawn today and your tests are completely normal, you will receive your results only by: MyChart Message (if you have MyChart) OR A paper copy in the mail If you have any lab test that is abnormal or we need to change your treatment, we will call you to review the results.   Testing/Procedures: None   Follow-Up: At [facility], you and your health needs are our priority.  As part of our continuing mission to provide you with exceptional heart care, we have created designated Provider Care Teams.  These Care Teams include your primary Cardiologist (physician) and Advanced Practice Providers (APPs -  Physician Assistants and Nurse Practitioners) who all work together to provide you with the care you need, when you need it.  We recommend signing up for the patient portal called "MyChart".  Sign up information is provided on this After Visit Summary.  MyChart is used to connect with patients for Virtual Visits (Telemedicine).  Patients are able to view lab/test results, encounter notes, upcoming appointments, etc.  Non-urgent messages can be sent to your provider as well.   To learn more about what you can do with MyChart, go to [email or web address].    Your next appointment:   1 year(s)  Provider:   You may see Lorine Bears, MD or one of the following Advanced Practice Providers on your designated Care Team:   Nicolasa Ducking, NP Eula Listen, PA-C Cadence Fransico Michael, PA-C Charlsie Quest, NP

## 2023-04-26 IMAGING — US US ABDOMEN LIMITED
1 series · 14 of 25 positions shown · non-contrast
Comparison: Abdominal ultrasound 09/29/2019

CLINICAL DATA: Elevated LFTs

EXAM:
ULTRASOUND ABDOMEN LIMITED RIGHT UPPER QUADRANT

[Series 1: us abdomen limited ruq (liver/gb) · 14 of 40 slices shown]
[im 1/40]
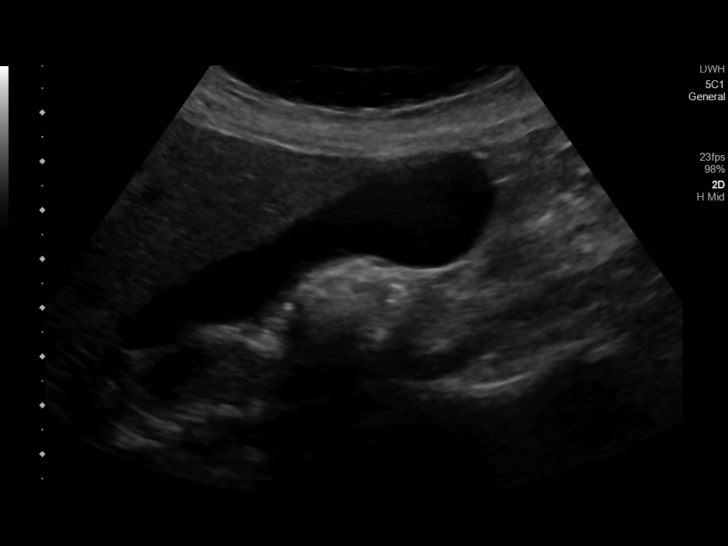
[im 4/40]
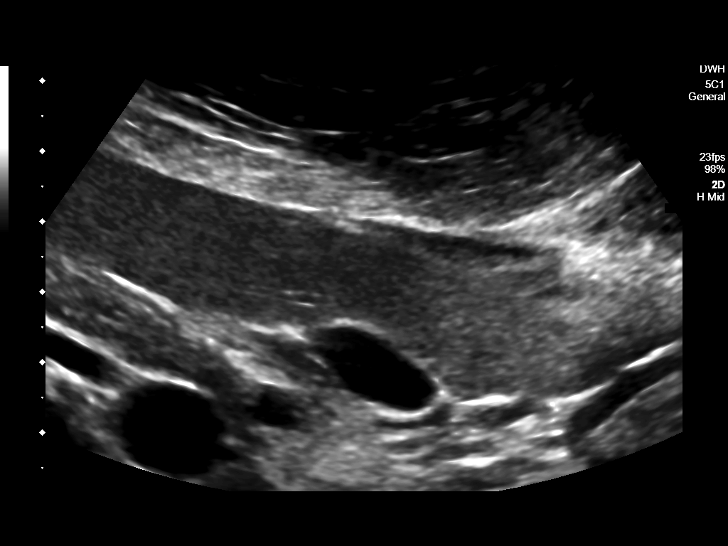
[im 7/40]
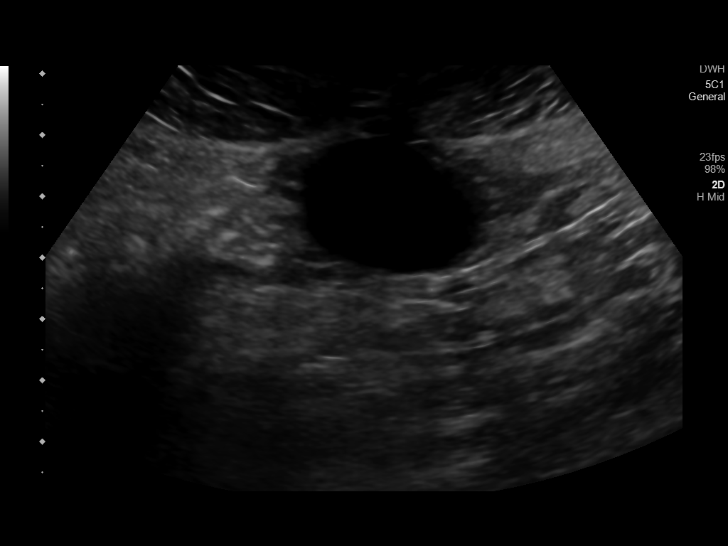
[im 10/40]
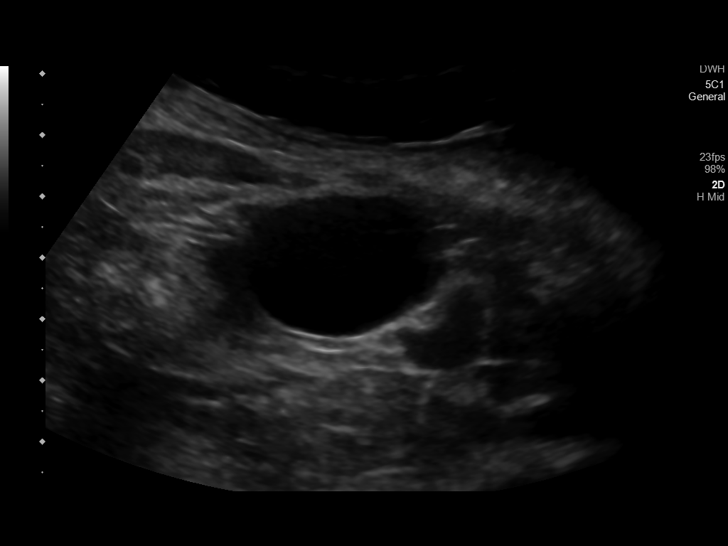
[im 14/40]
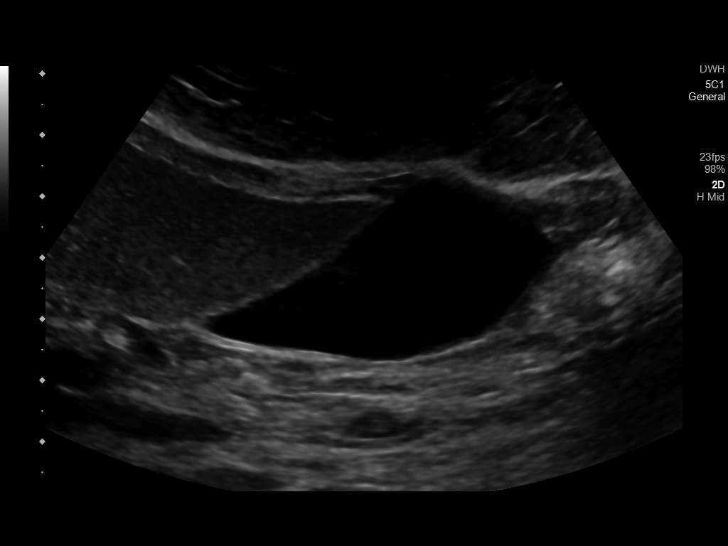
[im 15/40]
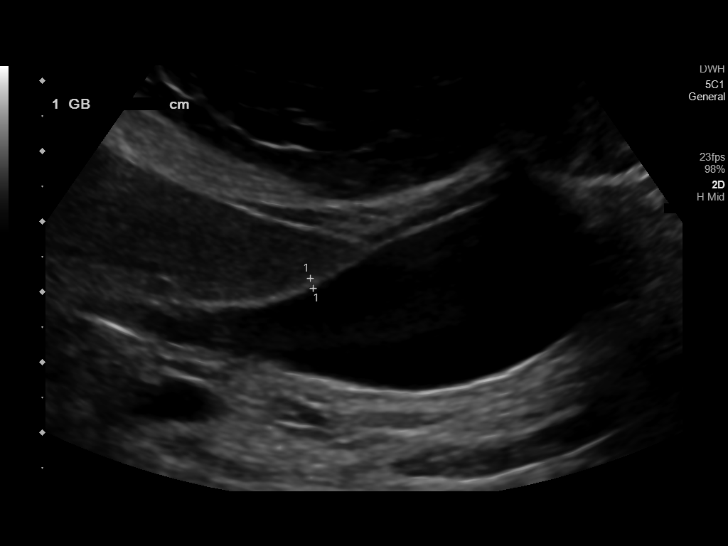
[im 18/40]
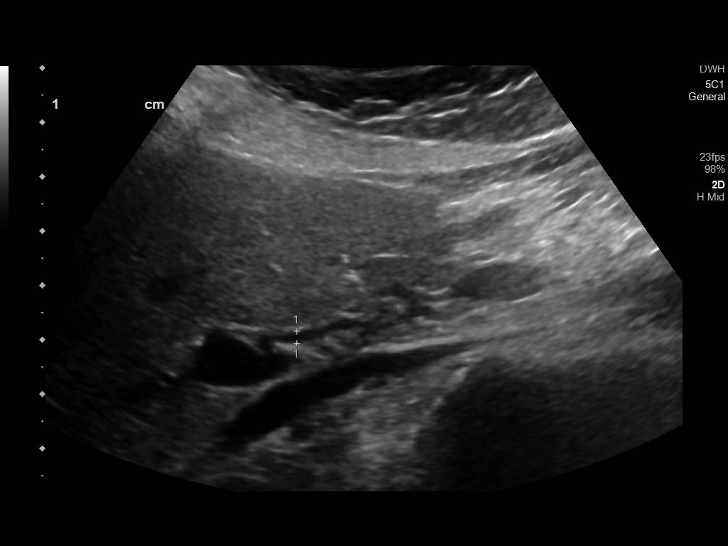
[im 22/40]
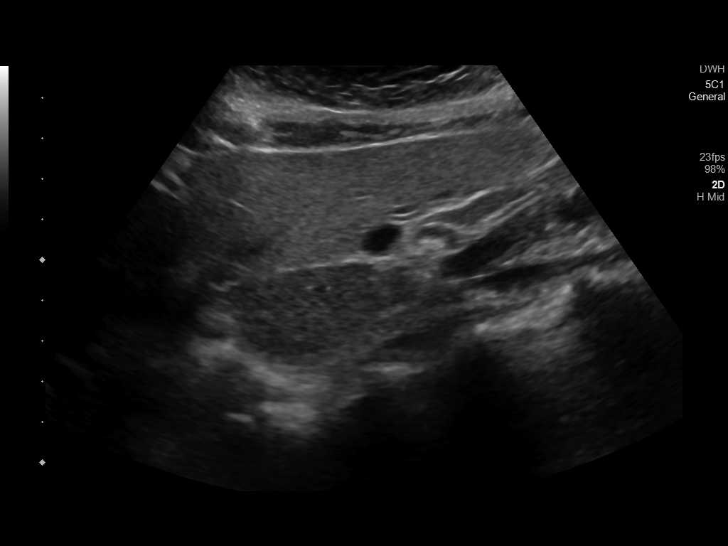
[im 25/40]
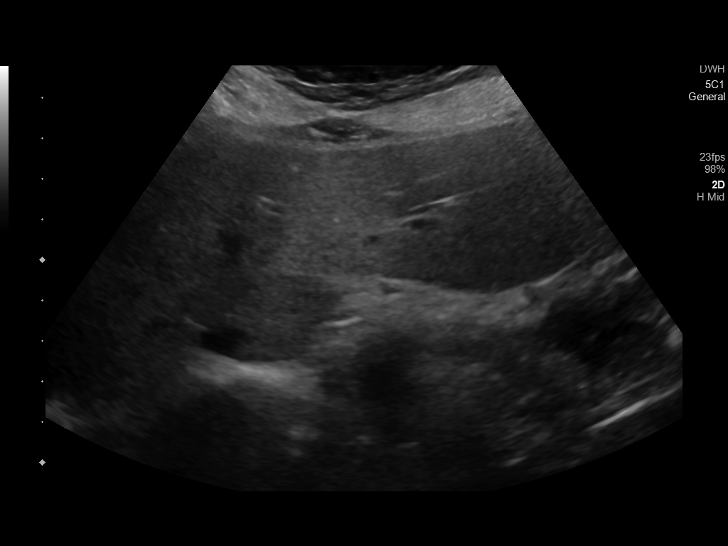
[im 27/40]
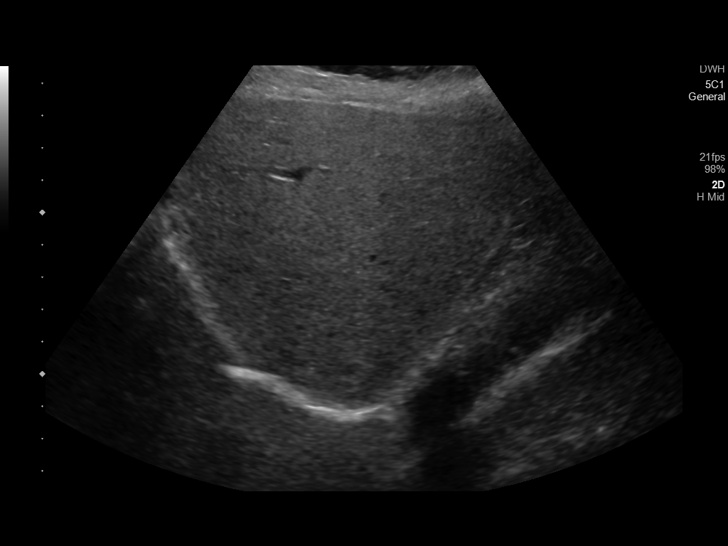
[im 30/40]
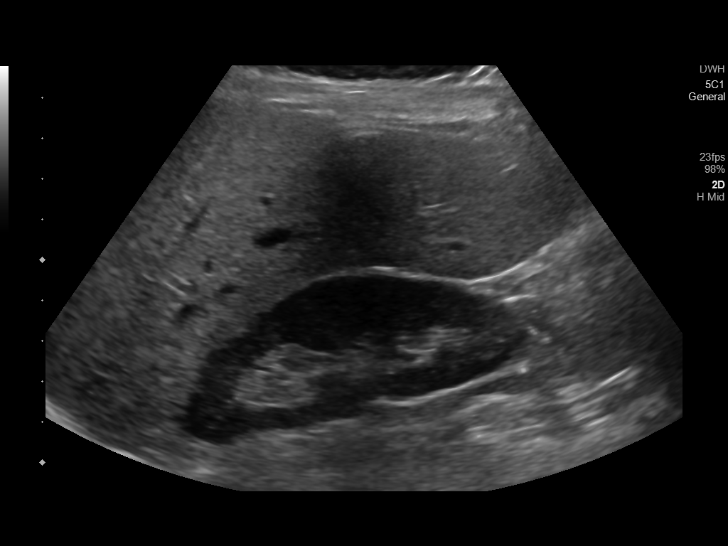
[im 33/40]
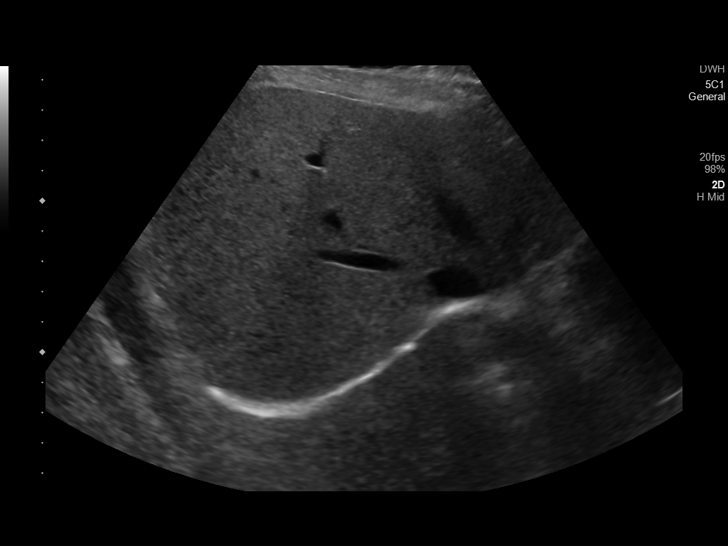
[im 36/40]
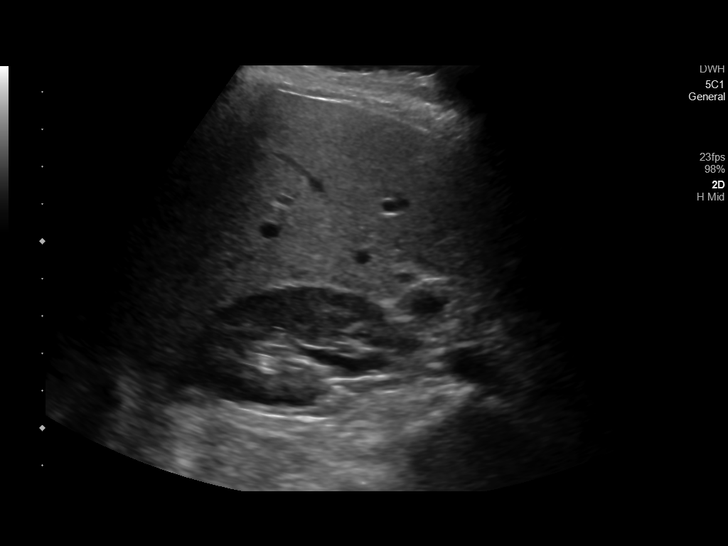
[im 40/40]
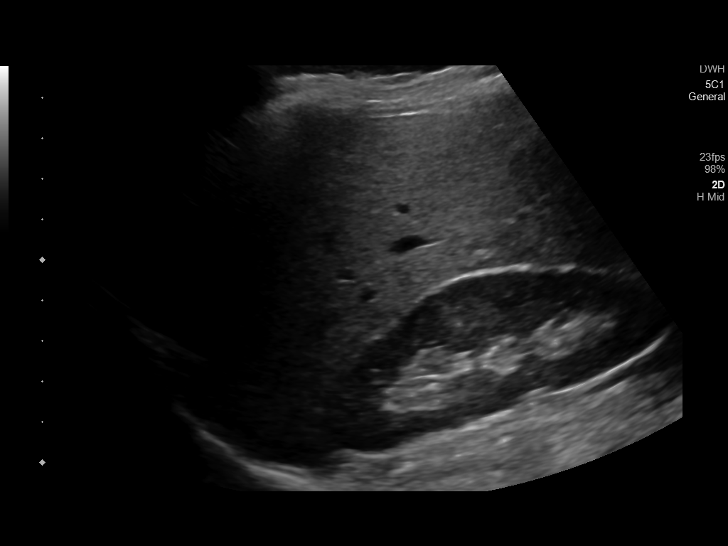

[14 of 25 positions shown; findings below may reference images not displayed]

FINDINGS: Gallbladder:

No gallstones or wall thickening visualized. No sonographic Murphy
sign noted by sonographer.

Common bile duct:

Diameter: 2 mm

Liver:

No focal lesion identified. Parenchymal echogenicity is increased
consistent with mild fatty infiltration. Portal vein is patent on
color Doppler imaging with normal direction of blood flow towards
the liver.

Other: None.
IMPRESSION: Mild fatty infiltration of the liver, otherwise unremarkable right
upper quadrant ultrasound.

## 2023-05-09 ENCOUNTER — Other Ambulatory Visit: Payer: Self-pay

## 2023-05-09 ENCOUNTER — Inpatient Hospital Stay
Admission: EM | Admit: 2023-05-09 | Discharge: 2023-05-11 | DRG: 897 | Disposition: A | Discharge status: Home / Self Care | Payer: Medicaid Other | Attending: Internal Medicine | Admitting: Internal Medicine

## 2023-05-09 DIAGNOSIS — Z888 Allergy status to other drugs, medicaments and biological substances status: Secondary | ICD-10-CM

## 2023-05-09 DIAGNOSIS — F1010 Alcohol abuse, uncomplicated: Secondary | ICD-10-CM | POA: Diagnosis not present

## 2023-05-09 DIAGNOSIS — Z789 Other specified health status: Secondary | ICD-10-CM

## 2023-05-09 DIAGNOSIS — D61818 Other pancytopenia: Secondary | ICD-10-CM | POA: Diagnosis present

## 2023-05-09 DIAGNOSIS — R9431 Abnormal electrocardiogram [ECG] [EKG]: Secondary | ICD-10-CM | POA: Diagnosis present

## 2023-05-09 DIAGNOSIS — K2970 Gastritis, unspecified, without bleeding: Secondary | ICD-10-CM | POA: Diagnosis present

## 2023-05-09 DIAGNOSIS — I472 Ventricular tachycardia, unspecified: Secondary | ICD-10-CM | POA: Diagnosis present

## 2023-05-09 DIAGNOSIS — Z716 Tobacco abuse counseling: Secondary | ICD-10-CM | POA: Diagnosis not present

## 2023-05-09 DIAGNOSIS — R112 Nausea with vomiting, unspecified: Principal | ICD-10-CM | POA: Diagnosis present

## 2023-05-09 DIAGNOSIS — K760 Fatty (change of) liver, not elsewhere classified: Secondary | ICD-10-CM | POA: Diagnosis present

## 2023-05-09 DIAGNOSIS — D696 Thrombocytopenia, unspecified: Secondary | ICD-10-CM | POA: Diagnosis present

## 2023-05-09 DIAGNOSIS — R7989 Other specified abnormal findings of blood chemistry: Secondary | ICD-10-CM | POA: Diagnosis present

## 2023-05-09 DIAGNOSIS — I5042 Chronic combined systolic (congestive) and diastolic (congestive) heart failure: Secondary | ICD-10-CM | POA: Diagnosis present

## 2023-05-09 DIAGNOSIS — F1020 Alcohol dependence, uncomplicated: Secondary | ICD-10-CM | POA: Diagnosis present

## 2023-05-09 DIAGNOSIS — R1110 Vomiting, unspecified: Secondary | ICD-10-CM | POA: Diagnosis present

## 2023-05-09 DIAGNOSIS — F1090 Alcohol use, unspecified, uncomplicated: Secondary | ICD-10-CM | POA: Diagnosis present

## 2023-05-09 DIAGNOSIS — Z8269 Family history of other diseases of the musculoskeletal system and connective tissue: Secondary | ICD-10-CM

## 2023-05-09 DIAGNOSIS — J45909 Unspecified asthma, uncomplicated: Secondary | ICD-10-CM | POA: Diagnosis not present

## 2023-05-09 DIAGNOSIS — K7010 Alcoholic hepatitis without ascites: Secondary | ICD-10-CM | POA: Diagnosis present

## 2023-05-09 DIAGNOSIS — Z8042 Family history of malignant neoplasm of prostate: Secondary | ICD-10-CM

## 2023-05-09 DIAGNOSIS — I5022 Chronic systolic (congestive) heart failure: Secondary | ICD-10-CM | POA: Diagnosis present

## 2023-05-09 DIAGNOSIS — F17210 Nicotine dependence, cigarettes, uncomplicated: Secondary | ICD-10-CM | POA: Diagnosis present

## 2023-05-09 DIAGNOSIS — E8729 Other acidosis: Secondary | ICD-10-CM | POA: Diagnosis present

## 2023-05-09 DIAGNOSIS — Z8674 Personal history of sudden cardiac arrest: Secondary | ICD-10-CM

## 2023-05-09 DIAGNOSIS — Z803 Family history of malignant neoplasm of breast: Secondary | ICD-10-CM

## 2023-05-09 DIAGNOSIS — Z8739 Personal history of other diseases of the musculoskeletal system and connective tissue: Secondary | ICD-10-CM

## 2023-05-09 LAB — CBC WITH DIFFERENTIAL/PLATELET
Abs Immature Granulocytes: 0.03 10*3/uL (ref 0.00–0.07)
Basophils Absolute: 0 10*3/uL (ref 0.0–0.1)
Basophils Relative: 1 %
Eosinophils Absolute: 0 10*3/uL (ref 0.0–0.5)
Eosinophils Relative: 1 %
HCT: 35.4 % — ABNORMAL LOW (ref 36.0–46.0)
Hemoglobin: 11.9 g/dL — ABNORMAL LOW (ref 12.0–15.0)
Immature Granulocytes: 1 %
Lymphocytes Relative: 18 %
Lymphs Abs: 0.7 10*3/uL (ref 0.7–4.0)
MCH: 27.3 pg (ref 26.0–34.0)
MCHC: 33.6 g/dL (ref 30.0–36.0)
MCV: 81.2 fL (ref 80.0–100.0)
Monocytes Absolute: 0.4 10*3/uL (ref 0.1–1.0)
Monocytes Relative: 10 %
Neutro Abs: 2.7 10*3/uL (ref 1.7–7.7)
Neutrophils Relative %: 69 %
Platelets: 155 10*3/uL (ref 150–400)
RBC: 4.36 MIL/uL (ref 3.87–5.11)
RDW: 16.6 % — ABNORMAL HIGH (ref 11.5–15.5)
WBC: 3.9 10*3/uL — ABNORMAL LOW (ref 4.0–10.5)
nRBC: 0 % (ref 0.0–0.2)

## 2023-05-09 LAB — HEPATIC FUNCTION PANEL
ALT: 84 U/L — ABNORMAL HIGH (ref 0–44)
AST: 367 U/L — ABNORMAL HIGH (ref 15–41)
Albumin: 4.3 g/dL (ref 3.5–5.0)
Alkaline Phosphatase: 77 U/L (ref 38–126)
Bilirubin, Direct: 0.1 mg/dL (ref 0.0–0.2)
Indirect Bilirubin: 0.5 mg/dL (ref 0.3–0.9)
Total Bilirubin: 0.6 mg/dL (ref 0.3–1.2)
Total Protein: 8 g/dL (ref 6.5–8.1)

## 2023-05-09 LAB — BASIC METABOLIC PANEL
Anion gap: 17 — ABNORMAL HIGH (ref 5–15)
BUN: 8 mg/dL (ref 6–20)
CO2: 17 mmol/L — ABNORMAL LOW (ref 22–32)
Calcium: 8.8 mg/dL — ABNORMAL LOW (ref 8.9–10.3)
Chloride: 107 mmol/L (ref 98–111)
Creatinine, Ser: 0.65 mg/dL (ref 0.44–1.00)
GFR, Estimated: >60 mL/min (ref >60)
Glucose, Bld: 110 mg/dL — ABNORMAL HIGH (ref 70–99)
Potassium: 3.7 mmol/L (ref 3.5–5.1)
Sodium: 141 mmol/L (ref 135–145)

## 2023-05-09 LAB — MAGNESIUM: Magnesium: 1.8 mg/dL (ref 1.7–2.4)

## 2023-05-09 LAB — LIPASE, BLOOD: Lipase: 23 U/L (ref 11–51)

## 2023-05-09 LAB — HCG, QUANTITATIVE, PREGNANCY: hCG, Beta Chain, Quant, S: <1 m[IU]/mL (ref <5)

## 2023-05-09 MED ORDER — THIAMINE HCL 100 MG/ML IJ SOLN: 100.0000 mg | Freq: Every day | INTRAMUSCULAR | Status: DC

## 2023-05-09 MED ORDER — ADULT MULTIVITAMIN W/MINERALS CH
1.0000 | ORAL_TABLET | Freq: Every day | ORAL | Status: DC
  Administered 2023-05-09: 1 via ORAL
  Filled 2023-05-09: qty 1

## 2023-05-09 MED ORDER — ADULT MULTIVITAMIN W/MINERALS CH
1.0000 | ORAL_TABLET | Freq: Every day | ORAL | Status: DC
  Administered 2023-05-10 – 2023-05-11 (×2): 1 via ORAL
  Filled 2023-05-09 (×2): qty 1

## 2023-05-09 MED ORDER — DIPHENHYDRAMINE HCL 50 MG/ML IJ SOLN
12.5000 mg | Freq: Once | INTRAMUSCULAR | Status: AC
  Administered 2023-05-09: 12.5 mg via INTRAVENOUS
  Filled 2023-05-09: qty 1

## 2023-05-09 MED ORDER — LORAZEPAM 1 MG PO TABS: 1.0000 mg | ORAL_TABLET | ORAL | Status: DC | PRN

## 2023-05-09 MED ORDER — SODIUM CHLORIDE 0.9 % IV BOLUS
1000.0000 mL | Freq: Once | INTRAVENOUS | Status: AC
  Administered 2023-05-09: 1000 mL via INTRAVENOUS

## 2023-05-09 MED ORDER — SODIUM CHLORIDE 0.9 % IV SOLN
25.0000 mg | Freq: Four times a day (QID) | INTRAVENOUS | Status: DC | PRN
  Administered 2023-05-09: 25 mg via INTRAVENOUS
  Filled 2023-05-09: qty 1

## 2023-05-09 MED ORDER — FOLIC ACID 1 MG PO TABS: 1.0000 mg | ORAL_TABLET | Freq: Every day | ORAL | Status: DC

## 2023-05-09 MED ORDER — SODIUM CHLORIDE 0.9 % IV SOLN
25.0000 mg | Freq: Once | INTRAVENOUS | Status: AC
  Administered 2023-05-09: 25 mg via INTRAVENOUS
  Filled 2023-05-09: qty 1

## 2023-05-09 MED ORDER — SODIUM CHLORIDE 0.9 % IV SOLN
25.0000 mg | Freq: Four times a day (QID) | INTRAVENOUS | Status: DC | PRN
  Filled 2023-05-09: qty 1

## 2023-05-09 MED ORDER — FOLIC ACID 1 MG PO TABS
1.0000 mg | ORAL_TABLET | Freq: Every day | ORAL | Status: DC
  Administered 2023-05-09 – 2023-05-11 (×3): 1 mg via ORAL
  Filled 2023-05-09 (×3): qty 1

## 2023-05-09 MED ORDER — METOCLOPRAMIDE HCL 5 MG/ML IJ SOLN
10.0000 mg | Freq: Once | INTRAMUSCULAR | Status: AC
  Administered 2023-05-09: 10 mg via INTRAVENOUS
  Filled 2023-05-09: qty 2

## 2023-05-09 MED ORDER — NICOTINE POLACRILEX 4 MG MT LOZG: 4.0000 mg | LOZENGE | OROMUCOSAL | 0 refills | Status: DC | PRN

## 2023-05-09 MED ORDER — LORAZEPAM 2 MG/ML IJ SOLN
1.0000 mg | INTRAMUSCULAR | Status: DC | PRN
  Administered 2023-05-09 – 2023-05-11 (×3): 1 mg via INTRAVENOUS
  Filled 2023-05-09 (×3): qty 1

## 2023-05-09 MED ORDER — POTASSIUM CHLORIDE IN NACL 40-0.9 MEQ/L-% IV SOLN
INTRAVENOUS | Status: AC
  Filled 2023-05-09 (×5): qty 1000

## 2023-05-09 MED ORDER — PANTOPRAZOLE SODIUM 40 MG IV SOLR
40.0000 mg | Freq: Two times a day (BID) | INTRAVENOUS | Status: DC
  Administered 2023-05-09 – 2023-05-11 (×5): 40 mg via INTRAVENOUS
  Filled 2023-05-09 (×4): qty 10

## 2023-05-09 MED ORDER — LORAZEPAM 2 MG/ML IJ SOLN: 1.0000 mg | INTRAMUSCULAR | Status: DC | PRN

## 2023-05-09 MED ORDER — PROCHLORPERAZINE EDISYLATE 10 MG/2ML IJ SOLN
10.0000 mg | Freq: Four times a day (QID) | INTRAMUSCULAR | Status: DC | PRN
  Administered 2023-05-09 – 2023-05-10 (×2): 10 mg via INTRAVENOUS
  Filled 2023-05-09 (×2): qty 2

## 2023-05-09 MED ORDER — THIAMINE MONONITRATE 100 MG PO TABS
100.0000 mg | ORAL_TABLET | Freq: Every day | ORAL | Status: DC
Start: 2023-05-09 — End: 2023-05-09
  Administered 2023-05-09: 100 mg via ORAL
  Filled 2023-05-09: qty 1

## 2023-05-09 MED ORDER — THIAMINE MONONITRATE 100 MG PO TABS
100.0000 mg | ORAL_TABLET | Freq: Every day | ORAL | Status: DC
  Administered 2023-05-10 – 2023-05-11 (×2): 100 mg via ORAL
  Filled 2023-05-09 (×2): qty 1

## 2023-05-09 MED ORDER — NICOTINE 7 MG/24HR TD PT24: 7.0000 mg | MEDICATED_PATCH | Freq: Every day | TRANSDERMAL | 0 refills | Status: DC

## 2023-05-09 NOTE — H&P (Addendum)
History and Physical    Patient: Yvette [name] WJX:[id number] DOB: [birth year] DOA: 05/09/2023 DOS: the patient was seen and examined on 05/09/2023 PCP: [facility]  Patient coming from: Home  Chief Complaint:  Chief Complaint  Patient presents with   Nausea   HPI: Yvette [name] is a 42 y.o. female with medical history significant of alcohol abuse, tobacco abuse, HFpEF presenting with intractable nausea and vomiting.  Patient reports acute flare of nausea and vomiting.  This has been a recurring issue for the patient.  No reported bloody or bilious emesis.  Baseline history of alcohol abuse.  Patient believes episode may be associate with alcohol.  Does drink beer on a regular basis.  1/4 to 1/2 pack/day smoker.  No fevers or chills.  No chest pain shortness of breath.  No focal hemiparesis or confusion.  Noted remote history of cardiac arrest associated with droperidol use. Presented to the ER afebrile, hemodynamically stable.  Satting well on room air.  White count 3.9, hemoglobin 11.9, platelets 155, creatinine 0.65.  Review of Systems: As mentioned in the history of present illness. All other systems reviewed and are negative. 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 in the setting of n/v->gastritis/etoh.   Leukopenia    Neutropenia (HCC)    NICM (nonischemic cardiomyopathy) (HCC)    a. 06/2021 Echo: EF 50-55%, [name]; b. 09/2021 ETT: Ex time 6:19, [name], [name]; c. 03/2022 Echo: EF 35-40%, [name]; d. 08/2022 Echo: EF 60-65%, no rwma, [name].   NSVT (nonsustained ventricular tachycardia) (HCC)    a. Noted during admission 06/2021 assoc w/ palpitations and presyncope in the setting of profound QT  prolongation/gastritis.   Tobacco abuse    Transaminitis    Past Surgical History:  Procedure Laterality Date   ESOPHAGOGASTRODUODENOSCOPY N/A 10/29/2020   Procedure: ESOPHAGOGASTRODUODENOSCOPY (EGD);  Surgeon: Wyline Mood, MD;  Location: [facility] ENDOSCOPY;  Service: Gastroenterology;  [name];   ESOPHAGOGASTRODUODENOSCOPY (EGD) WITH PROPOFOL N/A 03/14/2020   Procedure: ESOPHAGOGASTRODUODENOSCOPY (EGD) WITH PROPOFOL;  Surgeon: Pasty Spillers, MD;  [name];  Service: Endoscopy;  [name];   ESOPHAGOGASTRODUODENOSCOPY (EGD) WITH PROPOFOL N/A 02/25/2021   Procedure: ESOPHAGOGASTRODUODENOSCOPY (EGD) WITH PROPOFOL;  Surgeon: Pasty Spillers, MD;  [name];  Service: Endoscopy;  [name];   ESOPHAGOGASTRODUODENOSCOPY (EGD) WITH PROPOFOL N/A 06/25/2022   Procedure: ESOPHAGOGASTRODUODENOSCOPY (EGD) WITH PROPOFOL;  Surgeon: Jaynie Collins, DO;  Location: [facility] ENDOSCOPY;  Service: Gastroenterology;  [name];   Social History:  reports that she has been smoking cigarettes. She has a 2.5 pack-year smoking history. She has never used smokeless tobacco. She reports current alcohol use of about 61.0 standard drinks of alcohol per week. She reports that she does not use drugs.  Allergies  Allergen Reactions   Inapsine [Droperidol] Anaphylaxis   Zofran [Ondansetron] Other (See Comments)    Prolonged QT    Family History  Problem Relation Age of Onset   Lupus Mother    Prostate cancer Father    Cancer Paternal Aunt        Breast    Cancer Paternal Aunt  Breast     Prior to Admission medications   Medication Sig Start Date End Date Taking? Authorizing Provider  folic acid (FOLVITE) 1 MG tablet Take 1 tablet (1 mg total) by mouth daily. 01/26/23  Yes Earna Coder, MD  medroxyPROGESTERone Acetate (DEPO-PROVERA IM) Inject 150 mg into the muscle every 3 (three) months.   Yes [provider]  naltrexone  (DEPADE) 50 MG tablet Take 50 mg by mouth daily.   Yes [provider]  nicotine (NICODERM CQ - DOSED IN MG/24 HR) 7 mg/24hr patch Place 1 patch (7 mg total) onto the skin daily. 05/09/23 05/08/24 Yes Pilar Jarvis, MD  nicotine polacrilex (NICOTINE MINI) 4 MG lozenge Take 1 lozenge (4 mg total) by mouth as needed. 05/09/23  Yes Pilar Jarvis, MD  pantoprazole (PROTONIX) 40 MG tablet Take 1 tablet (40 mg total) by mouth daily. 06/26/22  Yes Enedina Finner, MD  ferrous sulfate 220 (44 Fe) MG/5ML solution Take 5 mLs (220 mg total) by mouth daily. Patient not taking: Reported on 05/09/2023 01/26/23   Earna Coder, MD    Physical Exam: Vitals:   05/09/23 0729 05/09/23 0930 05/09/23 1000 05/09/23 1205  BP: (!) 155/90 127/81 131/85   Pulse: 88 86 85   Resp: 18 18 13    Temp: 98 F (36.7 C)   98.9 F (37.2 C)  TempSrc:    Oral  SpO2: 97% 100% 100%   Weight:      Height:       Physical Exam Constitutional:      Appearance: She is normal weight.  HENT:     Head: Normocephalic and atraumatic.     Mouth/Throat:     Mouth: Mucous membranes are dry.  Eyes:     Pupils: Pupils are equal, round, and reactive to light.  Cardiovascular:     Rate and Rhythm: Normal rate and regular rhythm.  Pulmonary:     Effort: Pulmonary effort is normal.  Abdominal:     General: Bowel sounds are normal.  Musculoskeletal:        General: Normal range of motion.  Skin:    General: Skin is dry.  Neurological:     General: No focal deficit present.  Psychiatric:        Mood and Affect: Mood normal.     Data Reviewed:  There are no new results to review at this time.  US Abdomen Complete CLINICAL DATA:  HCC screening.  EXAM: ABDOMEN ULTRASOUND COMPLETE  COMPARISON:  August 13, 2022  FINDINGS: Gallbladder: No gallstones or wall thickening visualized. No sonographic Murphy sign noted by sonographer.  Common bile duct: Diameter: 2.6 mm  Liver: No focal lesion identified. Diffuse  increased echotexture. Portal vein is patent on color Doppler imaging with normal direction of blood flow towards the liver.  IVC: No abnormality visualized.  Pancreas: Visualized portion unremarkable.  Spleen: Size and appearance within normal limits.  Right Kidney: Length: 9.8 cm. Echogenicity within normal limits. No mass or hydronephrosis visualized.  Left Kidney: Length: 9.8 cm. Echogenicity within normal limits. No mass or hydronephrosis visualized.  Abdominal aorta: No aneurysm visualized.  Other findings: None.  IMPRESSION: 1. Diffuse increased echotexture of the liver. This is a nonspecific finding but can be seen in fatty infiltration of liver. No focal liver lesion identified. 2. No acute abnormality identified.  Electronically Signed   By: Sherian Rein M.D.   On: 01/19/2023 10:06  Lab Results  Component Value Date   WBC 3.9 (L)  05/09/2023   HGB 11.9 (L) 05/09/2023   HCT 35.4 (L) 05/09/2023   MCV 81.2 05/09/2023   PLT 155 05/09/2023   Last metabolic panel Lab Results  Component Value Date   GLUCOSE 110 (H) 05/09/2023   NA 141 05/09/2023   K 3.7 05/09/2023   CL 107 05/09/2023   CO2 17 (L) 05/09/2023   BUN 8 05/09/2023   CREATININE 0.65 05/09/2023   GFRNONAA >60 05/09/2023   CALCIUM 8.8 (L) 05/09/2023   PHOS 3.7 08/03/2022   PROT 8.0 05/09/2023   ALBUMIN 4.3 05/09/2023   LABGLOB 3.1 02/05/2021   AGRATIO 1.6 02/05/2021   BILITOT 0.6 05/09/2023   ALKPHOS 77 05/09/2023   AST 367 (H) 05/09/2023   ALT 84 (H) 05/09/2023   ANIONGAP 17 (H) 05/09/2023    Assessment and Plan: Intractable vomiting Recurrent intractable nausea and vomiting flare in the setting of alcohol abuse Failed course of antiemetics in ER Placed on course of antiemetic regimen overnight IV fluid hydration IV PPI Electrolyte correction as needed   Alcohol use disorder Regular alcohol use CIWA protocol Monitor  History of Prolonged QT interval QTc 459 today  Monitor  closely   Chronic systolic CHF (congestive heart failure) (HCC) 2D ECHO 08/2022 EF 60-65%  Mildly dry  IVF hydration  Follow volume status    Elevated LFTs AST 367, ALT 84 Recurring issue in setting of alcohol use Monitor   History of V-fib arrest with antiemetic droperidol 03/2022     Advance Care Planning:   Code Status: Prior   Consults: None   Family Communication: No family at the bedside   Severity of Illness: The appropriate patient status for this patient is OBSERVATION. Observation status is judged to be reasonable and necessary in order to provide the required intensity of service to ensure the patient's safety. The patient's presenting symptoms, physical exam findings, and initial radiographic and laboratory data in the context of their medical condition is felt to place them at decreased risk for further clinical deterioration. Furthermore, it is anticipated that the patient will be medically stable for discharge from the hospital within 2 midnights of admission.   Author: Floydene Flock, MD 05/09/2023 12:32 PM  For on call review [email or web address].

## 2023-05-09 NOTE — Assessment & Plan Note (Signed)
Trending in the right direction.  Likely secondary to alcohol abuse.  Right upper quadrant shows fatty infiltration of the liver.

## 2023-05-09 NOTE — ED Provider Notes (Addendum)
[facility] Provider Note    Event Date/Time   First MD Initiated Contact with Patient 05/09/23 0725     (approximate)   History   Nausea   HPI  Yvette [name] is a 42 y.o. female   Past medical history of alcohol use, frequent nausea and vomiting episodes, cardiac arrest following administration of droperidol in the past, who presents to emergency department with nausea and vomiting this morning in the setting of drinking alcohol yesterday.  She drinks nearly every single day.  She denies drug use.  She denies abdominal pain.  She has no urinary symptoms.  She denies GI bleeding.  Her last drink was last night.  She typically starts drinking in the afternoon.  She feels nauseated.   External Medical Documents Reviewed: Discharge summary from January 2024 for intractable vomiting      Physical Exam   Triage Vital Signs: ED Triage Vitals  Encounter Vitals Group     BP 05/09/23 0729 (!) 155/90     Systolic BP Percentile --      Diastolic BP Percentile --      Pulse Rate 05/09/23 0729 88     Resp 05/09/23 0729 18     Temp 05/09/23 0729 98 F (36.7 C)     Temp src --      SpO2 05/09/23 0729 97 %     Weight 05/09/23 0711 116 lb 12.8 oz (53 kg)     Height 05/09/23 0711 5\' 1"  (1.549 m)     Head Circumference --      Peak Flow --      Pain Score 05/09/23 0709 0     Pain Loc --      Pain Education --      Exclude from Growth Chart --     Most recent vital signs: Vitals:   05/09/23 0930 05/09/23 1000  BP: 127/81 131/85  Pulse: 86 85  Resp: 18 13  Temp:    SpO2: 100% 100%    General: Awake, no distress.  CV:  Good peripheral perfusion.  Resp:  Normal effort.  Abd:  No distention.  Other:  Awake alert - dehydrated appearing with dry mucous membranes but a soft and nontender abdominal exam deep palpation all quadrants without rigidity or guarding.   ED Results / Procedures / Treatments   Labs (all labs ordered are listed, but only  abnormal results are displayed) Labs Reviewed  BASIC METABOLIC PANEL - Abnormal; Notable for the following components:      Result Value   CO2 17 (*)    Glucose, Bld 110 (*)    Calcium 8.8 (*)    Anion gap 17 (*)    All other components within normal limits  CBC WITH DIFFERENTIAL/PLATELET - Abnormal; Notable for the following components:   WBC 3.9 (*)    Hemoglobin 11.9 (*)    HCT 35.4 (*)    RDW 16.6 (*)    All other components within normal limits  HEPATIC FUNCTION PANEL - Abnormal; Notable for the following components:   AST 367 (*)    ALT 84 (*)    All other components within normal limits  MAGNESIUM  LIPASE, BLOOD  HCG, QUANTITATIVE, PREGNANCY     I ordered and reviewed the above labs they are notable for blood cell count is 3.9, consistent with chronically low white blood cell count, H&H at baseline slightly improved from prior perhaps hemoconcentrated in the setting of dehydration  EKG  ED ECG REPORT I, Pilar Jarvis, the attending physician, personally viewed and interpreted this ECG.   Date: 05/09/2023  EKG Time: 0736  Rate: 73  Rhythm: sinus  Axis: nl  Intervals:none  ST&T Change: no stemi     PROCEDURES:  Critical Care performed: No  Procedures   MEDICATIONS ORDERED IN ED: Medications  sodium chloride 0.9 % bolus 1,000 mL (1,000 mLs Intravenous New Bag/Given 05/09/23 0801)  metoCLOPramide (REGLAN) injection 10 mg (10 mg Intravenous Given 05/09/23 0804)  diphenhydrAMINE (BENADRYL) injection 12.5 mg (12.5 mg Intravenous Given 05/09/23 0803)  promethazine (PHENERGAN) 25 mg in sodium chloride 0.9 % 50 mL IVPB (25 mg Intravenous New Bag/Given 05/09/23 1029)   IMPRESSION / MDM / ASSESSMENT AND PLAN / ED COURSE  I reviewed the triage vital signs and the nursing notes.                                Patient's presentation is most consistent with acute presentation with potential threat to life or bodily function.  Differential diagnosis includes, but  is not limited to, nausea and vomiting in the setting of alcohol use, electrolyte disturbances, intra-abdominal infection or obstruction, consider withdrawal but does not appear to be in acute withdrawal, consider pregnancy related   The patient is on the cardiac monitor to evaluate for evidence of arrhythmia and/or significant heart rate changes.  MDM:    Recurring nausea and vomiting and this daily drinker who presents with the same nausea and vomiting only.  Nontender abdominal exam rules against surgical abdominal pathologies at this time she denies any urinary symptoms suggest UTI.  Check pregnancy test, electrolytes, medicate with Reglan which has been tolerated in the past.  Looks dehydrated will get IV crystalloid bolus.  Will proceed w workup as above and reassessment on p.o. status, disposition pending.   -- Patient stable, no active vomiting, LFTs increased in a ratio most suspicious for alcohol use though I did consider intra-abdominal infection like cholecystitis but given a soft nontender abdominal exam think this is less likely at this time.     She continues to vomit despite treatment with multiple antiemetics.  Admission for intractable vomiting.  ---- I spent 5 minutes counseling this patient on smoking cessation.  We spoke about the patient's current tobacco use, impact of smoking, assessed willingness to quit, methods for cessation including medical management and nicotine replacement therapy (which I prescribed to the patient) and advised follow-up with primary doctor to continue to address smoking cessation. ---   FINAL CLINICAL IMPRESSION(S) / ED DIAGNOSES   Final diagnoses:  Nausea and vomiting, unspecified vomiting type  Alcohol use  Encounter for smoking cessation counseling     Rx / DC Orders   ED Discharge Orders          Ordered    nicotine polacrilex (NICOTINE MINI) 4 MG lozenge  As needed        05/09/23 0935    nicotine (NICODERM CQ - DOSED IN  MG/24 HR) 7 mg/24hr patch  Daily        05/09/23 0935             Note:  This document was prepared using Dragon voice recognition software and may include unintentional dictation errors.    Pilar Jarvis, MD 05/09/23 4098    Pilar Jarvis, MD 05/09/23 1124

## 2023-05-09 NOTE — ED Notes (Addendum)
Pt assisted to bathroom, pt unable to give urine sample. Pt back in bed, and requesting more nausea medicine. ERP notified, see orders for further details

## 2023-05-09 NOTE — Assessment & Plan Note (Signed)
QTc 459.  Careful with nausea medication.

## 2023-05-09 NOTE — Assessment & Plan Note (Signed)
Regular alcohol use CIWA protocol Monitor

## 2023-05-09 NOTE — Plan of Care (Signed)

## 2023-05-09 NOTE — ED Triage Notes (Addendum)
Pt comes via EMs from home with c/o nausea and vomiting. Pt took some meds with no relief. Pt denies any belly pain. Pt does admit to some alcohol use last night.  VSS per EMS

## 2023-05-09 NOTE — Assessment & Plan Note (Addendum)
Recurrent intractable nausea and vomiting flare in the setting of alcohol abuse Failed course of antiemetics in ER Placed on course of antiemetic regimen overnight IV fluid hydration IV PPI Electrolyte correction as needed

## 2023-05-09 NOTE — Assessment & Plan Note (Signed)
No signs of heart failure.  Last echocardiogram shows an EF of 60%.

## 2023-05-09 NOTE — Discharge Instructions (Addendum)
Intensive Outpatient Programs   [facility] [address] [place],  [place], [place] [phone]  [facility] (Inpatient and [phone] (Suboxone and Methadone) [address] [phone]  ADS: Alcohol & Drug [facility] - Intensive Outpatient [address] [place], [place] [address], [place]  [phone]  [facility]ye (Outpatient, Inpatient, [facility] (Groups and Residental) (insurance only) [phone] [place], [place] [phone]   [facility] (for caregivers and family) [address] Laurell Josephs [address] [place], [place] [phone]  Residential Treatment Programs  [facility](2 years) Residential: 28 days)[facility] ([facility] [address] [place], [place], [place] [phone] or [phone]  [facility] Treatment [facility] [address] [place], [place], [place] [phone]  [facility] Residential Treatment Facility[facility]idential Treatment Services (RTS) [address] [place], [place], [place] [phone] Admissions: 8am-3pm M-F  [facility] Program: Residential Program [phone] Days)             [facility]: [facility]  [place], [place], [place]  [phone] or [phone] in Hours over the weekend or by referral)  [facility] [address] [place], [place] [address] [phone] (Do virtual or phone assessment, offer transportation within 25 miles, have in patient and Outpatient options)   [facility] Crisis: [facility]:[phone] (for crisis  response 24 hours a day)      Addiction and Co-Occurring Disorders   [facility], Intensive Outpatient Program is held three afternoons from 1 to 4pm; Monday, Wednesday and Friday allowing patients the opportunity to participate in a treatment program in the day while attending A meetings in the evening. W e provide educational lectures and films, group therapy, individual counseling and family counseling.  A multidisciplinary team of the [facility] healthcare professionals, headed by the Medical Director, designs an individualized treatment program to suit the needs of each patient. The length of stay in this program is determined by the goals and objectives set by the treatment team, but usually is 6 to 10 weeks. Direct admission to IOP is possible after an individual evaluation by our assessment team. Chemical usage patterns, the progressionof disease in the individual, the possible need for detoxification and the availability of an external support system are factors in determining who is appropriate for. direct OP admission. Each person has the opportunity to embrace a lifestyle of on-going recovery, family involvement and attending a 12 Step Support group. Program Objectives Include  Chemicaldependency education Abstinence for all mind-altering chemicals 12 Step support Relapse Prevention Planning Improved communication skills Spirituality and personal fulfilment Mondav-Wednesday-Friday 1:00pm to 4:00pm Individual and Family Sessions are set by the Counselor ANN EVANS, LCAS, NCC, MA Licensed Clinical Addictions Spcialist [phone]    [facility] Chemical Depencency Intensive OutpatientPrograms Alcohol & Drug Services (ADS) [address] [place] [address] [place]: [phone] [place]: [phone] : [phone] [place]: [phone] Both daytime & evening counseling available Accepts Medicaid and other insurances  [facility] [phone] Meets four times a week for 2 1?2 -3 hours each session Both daytime & evening counseling available Accepts Medicaid and other insurances  [facility] Health CD-IOP - Outpatient Services [address] [place] [address] [phone] Meets 3 days a week for 4 hours Accepts Medicare and  other insurances (not Medicaid)  [employer] [facility] [address] [place] [address] [phone]  These referrals have been provided to you as appropriate for your clinical needs while taking into account your financial concerns. Please be aware that agencies, practitioners and insurance companies sometimes change contracts. When calling to make an appointment have your insurance information available so the professional you aregoing to see can confirm whether they are covered by your plan. Take this form with you in case the person you are seeing needs a copy or to contact us. Assessment Counselor   [facility] Outpatient Programs [facility] Health Outpatient Chemical Dependence Intensive Outpatient Program [address] [place], [place] [address] [phone] Private insurance, Medicare A&B, and [facility] ADS (Alcohol and Drug Services) [address], [place], [place] [address] [phone] Medicaid, Self Pay  [facility] [address] Jamey Ripa [phone] Private Insurance, Self Pay  [facility] Partial Hospitalization Program [address] [place], [place] [address] [phone] Private Insurance, Medicaid-[facility] and Loann Quill only for partial [facility] [address] [place], [place] [phone] Private Insurance and Self Pay  [facility] 3 7 1  [facility] [address] [place], [place] [phone] Private Insurance, and Self Pay.  Theatre stage manager (Partial Hospitalization) [address] [place] [address] [phone] [facility] Outpatient [address] [employer] [address] [employer], [place], and Self Pay  [facility]: [facility]one Clinic [address] [place], [place], [place] [address] [phone] [address] [address] [place], [place] [address] Phone: [phone] or [phone] [facility] Services [address] [place], [place] [address] [phone] Medicaid, private insurance [facility] [address] [place], [place] [address] [phone] or [phone] Private Insurance Only  [facility] [address] [phone] Self Pay only, sliding scale  [facility] [address] [place], [place] [address] [phone] State funds for uninsured [facility] [place], [place] [phone] Private Insurance, and Self pay [employer], some Medicaid Crisis Mobile: [facility]: [phone] (for crisis response 24 hours a day) [facility] [phone]   [facility] Ng [phone] Private Insurance, and [facility]  [facility] [address] [place], [place] [phone] Private Insurance, and [place]. [facility]: [phone] ASAP: Adolescent Substance Abuse Program Males ages: 12-17 ASAP: Adolescent Substance abuse Program Females: 12-17 [facility] [employer] Structured Day Program [phone] CrisisMobile: [facility]: [phone] (for crisis response 24 hours a day) [facility] [phone]

## 2023-05-10 ENCOUNTER — Observation Stay: Payer: Medicaid Other

## 2023-05-10 DIAGNOSIS — Z8739 Personal history of other diseases of the musculoskeletal system and connective tissue: Secondary | ICD-10-CM | POA: Diagnosis not present

## 2023-05-10 DIAGNOSIS — Z8042 Family history of malignant neoplasm of prostate: Secondary | ICD-10-CM | POA: Diagnosis not present

## 2023-05-10 DIAGNOSIS — E8729 Other acidosis: Secondary | ICD-10-CM | POA: Diagnosis present

## 2023-05-10 DIAGNOSIS — K760 Fatty (change of) liver, not elsewhere classified: Secondary | ICD-10-CM | POA: Insufficient documentation

## 2023-05-10 DIAGNOSIS — K2970 Gastritis, unspecified, without bleeding: Secondary | ICD-10-CM | POA: Diagnosis present

## 2023-05-10 DIAGNOSIS — Z8269 Family history of other diseases of the musculoskeletal system and connective tissue: Secondary | ICD-10-CM | POA: Diagnosis not present

## 2023-05-10 DIAGNOSIS — I5042 Chronic combined systolic (congestive) and diastolic (congestive) heart failure: Secondary | ICD-10-CM | POA: Diagnosis present

## 2023-05-10 DIAGNOSIS — Z789 Other specified health status: Secondary | ICD-10-CM | POA: Diagnosis present

## 2023-05-10 DIAGNOSIS — I472 Ventricular tachycardia, unspecified: Secondary | ICD-10-CM | POA: Diagnosis present

## 2023-05-10 DIAGNOSIS — J45909 Unspecified asthma, uncomplicated: Secondary | ICD-10-CM | POA: Diagnosis not present

## 2023-05-10 DIAGNOSIS — R9431 Abnormal electrocardiogram [ECG] [EKG]: Secondary | ICD-10-CM

## 2023-05-10 DIAGNOSIS — R1110 Vomiting, unspecified: Secondary | ICD-10-CM | POA: Diagnosis not present

## 2023-05-10 DIAGNOSIS — I5022 Chronic systolic (congestive) heart failure: Secondary | ICD-10-CM

## 2023-05-10 DIAGNOSIS — F1010 Alcohol abuse, uncomplicated: Secondary | ICD-10-CM | POA: Diagnosis present

## 2023-05-10 DIAGNOSIS — Z8674 Personal history of sudden cardiac arrest: Secondary | ICD-10-CM | POA: Diagnosis not present

## 2023-05-10 DIAGNOSIS — R7989 Other specified abnormal findings of blood chemistry: Secondary | ICD-10-CM

## 2023-05-10 DIAGNOSIS — D61818 Other pancytopenia: Secondary | ICD-10-CM | POA: Diagnosis present

## 2023-05-10 DIAGNOSIS — Z888 Allergy status to other drugs, medicaments and biological substances status: Secondary | ICD-10-CM | POA: Diagnosis not present

## 2023-05-10 DIAGNOSIS — D696 Thrombocytopenia, unspecified: Secondary | ICD-10-CM | POA: Diagnosis present

## 2023-05-10 DIAGNOSIS — Z803 Family history of malignant neoplasm of breast: Secondary | ICD-10-CM | POA: Diagnosis not present

## 2023-05-10 DIAGNOSIS — F17210 Nicotine dependence, cigarettes, uncomplicated: Secondary | ICD-10-CM | POA: Diagnosis present

## 2023-05-10 DIAGNOSIS — K7010 Alcoholic hepatitis without ascites: Secondary | ICD-10-CM | POA: Diagnosis present

## 2023-05-10 LAB — COMPREHENSIVE METABOLIC PANEL
ALT: 58 U/L — ABNORMAL HIGH (ref 0–44)
AST: 121 U/L — ABNORMAL HIGH (ref 15–41)
Albumin: 3.9 g/dL (ref 3.5–5.0)
Alkaline Phosphatase: 60 U/L (ref 38–126)
Anion gap: 13 (ref 5–15)
BUN: 9 mg/dL (ref 6–20)
CO2: 17 mmol/L — ABNORMAL LOW (ref 22–32)
Calcium: 9.4 mg/dL (ref 8.9–10.3)
Chloride: 113 mmol/L — ABNORMAL HIGH (ref 98–111)
Creatinine, Ser: 0.76 mg/dL (ref 0.44–1.00)
GFR, Estimated: >60 mL/min (ref >60)
Glucose, Bld: 77 mg/dL (ref 70–99)
Potassium: 4.6 mmol/L (ref 3.5–5.1)
Sodium: 143 mmol/L (ref 135–145)
Total Bilirubin: 1.3 mg/dL — ABNORMAL HIGH (ref 0.3–1.2)
Total Protein: 7.5 g/dL (ref 6.5–8.1)

## 2023-05-10 LAB — URINE DRUG SCREEN, QUALITATIVE (ARMC ONLY)
Amphetamines, Ur Screen: NOT DETECTED
Barbiturates, Ur Screen: NOT DETECTED
Benzodiazepine, Ur Scrn: POSITIVE — AB
Cannabinoid 50 Ng, Ur ~~LOC~~: NOT DETECTED
Cocaine Metabolite,Ur ~~LOC~~: NOT DETECTED
MDMA (Ecstasy)Ur Screen: NOT DETECTED
Methadone Scn, Ur: NOT DETECTED
Opiate, Ur Screen: NOT DETECTED
Phencyclidine (PCP) Ur S: NOT DETECTED
Tricyclic, Ur Screen: POSITIVE — AB

## 2023-05-10 LAB — MAGNESIUM: Magnesium: 1.9 mg/dL (ref 1.7–2.4)

## 2023-05-10 MED ORDER — SODIUM CHLORIDE 0.9 % IV SOLN: 25.0000 mg | Freq: Four times a day (QID) | INTRAVENOUS | Status: DC | PRN

## 2023-05-10 MED ORDER — PROCHLORPERAZINE EDISYLATE 10 MG/2ML IJ SOLN
10.0000 mg | Freq: Four times a day (QID) | INTRAMUSCULAR | Status: DC | PRN
  Administered 2023-05-10 – 2023-05-11 (×3): 10 mg via INTRAVENOUS
  Filled 2023-05-10 (×3): qty 2

## 2023-05-10 MED ORDER — POTASSIUM CHLORIDE IN NACL 40-0.9 MEQ/L-% IV SOLN
INTRAVENOUS | Status: DC
  Filled 2023-05-10 (×2): qty 1000

## 2023-05-10 MED ORDER — KCL IN DEXTROSE-NACL 20-5-0.9 MEQ/L-%-% IV SOLN
INTRAVENOUS | Status: DC
  Filled 2023-05-10 (×2): qty 1000

## 2023-05-10 MED ORDER — ENOXAPARIN SODIUM 40 MG/0.4ML IJ SOSY
40.0000 mg | PREFILLED_SYRINGE | INTRAMUSCULAR | Status: DC
  Administered 2023-05-10: 40 mg via SUBCUTANEOUS
  Filled 2023-05-10: qty 0.4

## 2023-05-10 NOTE — TOC Initial Note (Signed)
Transition of Care [facility]) - Initial/Assessment Note    Patient Details  Name: Yvette [name] MRN: [id number] Date of Birth: Jan 19, 1981  Transition of Care [facility]) CM/SW Contact:    Margarito Liner, LCSW Phone Number: 05/10/2023, 11:42 AM  Clinical Narrative:   CSW met with patient. No supports at bedside. CSW introduced role and inquired about interest in SA resources. Patient is agreeable. CSW added resources to her AVS. No further concerns. CSW will continue to follow patient's progress and facilitate return home once stable.               Expected Discharge Plan: Home/Self Care Barriers to Discharge: Continued Medical Work up   Patient Goals and CMS Choice            Expected Discharge Plan and Services     Post Acute Care Choice: NA Living arrangements for the past 2 months: Apartment                                      Prior Living Arrangements/Services Living arrangements for the past 2 months: Apartment   Patient language and need for interpreter reviewed:: Yes Do you feel safe going back to the place where you live?: Yes      Need for Family Participation in Patient Care: Yes (Comment)     Criminal Activity/Legal Involvement Pertinent to Current Situation/Hospitalization: No - Comment as needed  Activities of Daily Living   ADL Screening (condition at time of admission) Independently performs ADLs?: Yes (appropriate for developmental age) Is the patient deaf or have difficulty hearing?: No Does the patient have difficulty seeing, even when wearing glasses/contacts?: No Does the patient have difficulty concentrating, remembering, or making decisions?: No  Permission Sought/Granted                  Emotional Assessment Appearance:: Appears stated age Attitude/Demeanor/Rapport: Engaged Affect (typically observed): Appropriate Orientation: : Oriented to Self, Oriented to Place, Oriented to  Time, Oriented to Situation Alcohol / Substance Use:  Alcohol Use Psych Involvement: No (comment)  Admission diagnosis:  Alcohol use [Z78.9] Intractable nausea and vomiting [R11.2] Encounter for smoking cessation counseling [Z71.6] Nausea and vomiting, unspecified vomiting type [R11.2] Nausea & vomiting [R11.2] Patient Active Problem List   Diagnosis Date Noted   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 03/2022 08/02/2022   History of Prolonged QT interval 08/02/2022   Alcohol use disorder 08/02/2022   Iron deficiency 07/22/2022   Suicide ideation 04/05/2022   Delirium tremens (HCC) 04/05/2022   Hypovolemic shock (HCC) 04/05/2022   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   Dilated cardiomyopathy (HCC)    Hypophosphatemia 04/01/2022   Acute hypoxemic respiratory failure (HCC) 04/01/2022   Chronic systolic CHF (congestive heart failure) (HCC) 04/01/2022   COVID-19 virus infection 04/01/2022   Ventricular fibrillation (HCC) 03/30/2022   Hypomagnesemia 01/09/2022   Nausea vomiting and diarrhea 01/08/2022   Intractable nausea and vomiting 06/28/2021   Tobacco abuse 06/28/2021   Alcohol abuse 06/28/2021   Hematemesis 06/28/2021   Alcoholic liver disease, unspecified (HCC) 10/26/2020   Alcohol withdrawal (HCC) 10/26/2020   Alcoholic ketoacidosis 10/26/2020   AKI (acute kidney injury) (HCC) 10/26/2020   High serum osmolar gap 10/26/2020   Hiatal hernia    Portal hypertension (HCC)    Nausea & vomiting 11/10/2019   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   Intractable vomiting 10/01/2019   Alcohol use, daily 09/30/2019  Elevated LFTs 09/30/2019   Thrombocytopenia (HCC) 09/30/2019   Hypokalemia    Dehydration    Food poisoning    Other neutropenia (HCC) 06/11/2016   Leukopenia 02/03/2015   PCP:  [facility] [employer] [facility] Pharmacy:   [facility] [address] (N),  - [address] [place]) [place] [address] Phone: [phone] Fax:  [phone]     Social Determinants of Health (SDOH) Social History: SDOH Screenings   Food Insecurity: No Food Insecurity (05/09/2023)  Housing: Low Risk  (05/09/2023)  Transportation Needs: No Transportation Needs (05/09/2023)  Utilities: Not At Risk (05/09/2023)  Tobacco Use: High Risk (05/09/2023)   SDOH Interventions:     Readmission Risk Interventions     No data to display

## 2023-05-10 NOTE — Progress Notes (Signed)
Progress Note   Patient: Yvette [name] AOZ:[id number] DOB: 04-13-1981 DOA: 05/09/2023     0 DOS: the patient was seen and examined on 05/10/2023   Brief hospital course: 42 year old female past medical history of alcohol abuse.  She presents with intractable nausea vomiting.    10/29.  Continue IV fluids for alcoholic ketoacidosis.    Assessment and Plan: Alcoholic ketoacidosis Add D5 to IV fluids.  Patient had increased anion gap of 17 on presentation and this has normalized.  CO2 still low.  Intractable vomiting IV fluid hydration.  Patient does better with Compazine so we will use that as a first-line agent.  Fatty liver Must stop drinking  History of Prolonged QT interval QTc 459.  Careful with nausea medication.   Chronic systolic CHF (congestive heart failure) (HCC) No signs of heart failure.  Last echocardiogram shows an EF of 60%.   Alcohol abuse Alcohol withdrawal protocol  Elevated LFTs Trending in the right direction.  Likely secondary to alcohol abuse.  Right upper quadrant shows fatty infiltration of the liver.        Subjective: Patient had some nausea vomiting with water this morning.  She states that Compazine works better for her.  Patient has a history of alcohol abuse.  No abdominal pain.  Physical Exam: Vitals:   05/09/23 1230 05/09/23 2019 05/10/23 0508 05/10/23 0958  BP: (!) 142/84 (!) 154/92 (!) 156/78 (!) 152/80  Pulse: 89 73 79 (!) 52  Resp: 15 18 18 18   Temp: 97.7 F (36.5 C) 99.1 F (37.3 C) 99.6 F (37.6 C) 99.2 F (37.3 C)  TempSrc: Oral Oral Oral Oral  SpO2: 100% 100% 100% 100%  Weight:      Height:       Physical Exam HENT:     Head: Normocephalic.     Mouth/Throat:     Pharynx: No oropharyngeal exudate.  Eyes:     General: Lids are normal.     Conjunctiva/sclera: Conjunctivae normal.  Cardiovascular:     Rate and Rhythm: Normal rate and regular rhythm.     Heart sounds: Normal heart sounds, S1 normal and S2  normal.  Pulmonary:     Breath sounds: No decreased breath sounds, wheezing, rhonchi or rales.  Abdominal:     Palpations: Abdomen is soft.     Tenderness: There is abdominal tenderness in the epigastric area.  Musculoskeletal:     Right lower leg: No swelling.     Left lower leg: No swelling.  Skin:    General: Skin is warm.     Findings: No rash.  Neurological:     Mental Status: She is alert and oriented to person, place, and time.     Data Reviewed: Creatinine 0.76, potassium 4.3, CO2 17, AST 121, ALT 58, total bilirubin 1.3  Family Communication: Refused  Disposition: Status is: Inpatient Remains inpatient appropriate because: Will start off with liquid diet.  Planned Discharge Destination: Home    Time spent: 28 minutes  Author: Alford Highland, MD 05/10/2023 2:42 PM  For on call review [email or web address].

## 2023-05-10 NOTE — Assessment & Plan Note (Signed)
Alcohol withdrawal protocol. 

## 2023-05-10 NOTE — Assessment & Plan Note (Signed)
Add D5 to IV fluids.  Patient had increased anion gap of 17 on presentation and this has normalized.  CO2 still low.

## 2023-05-10 NOTE — Assessment & Plan Note (Signed)
Must stop drinking

## 2023-05-10 NOTE — Hospital Course (Signed)
42 year old female past medical history of alcohol abuse.  She presents with intractable nausea vomiting.    10/29.  Continue IV fluids for alcoholic ketoacidosis.

## 2023-05-10 NOTE — Plan of Care (Signed)
  Problem: Education: Goal: Knowledge of General Education information will improve Description: Including pain rating scale, medication(s)/side effects and non-pharmacologic comfort measures Outcome: Progressing   Problem: Health Behavior/Discharge Planning: Goal: Ability to manage health-related needs will improve Outcome: Progressing   Problem: Clinical Measurements: Goal: Ability to maintain clinical measurements within normal limits will improve Outcome: Progressing   Problem: Clinical Measurements: Goal: Will remain free from infection Outcome: Progressing   Problem: Clinical Measurements: Goal: Respiratory complications will improve Outcome: Progressing   Problem: Clinical Measurements: Goal: Cardiovascular complication will be avoided Outcome: Progressing   

## 2023-05-11 DIAGNOSIS — E8729 Other acidosis: Secondary | ICD-10-CM | POA: Diagnosis not present

## 2023-05-11 DIAGNOSIS — R1110 Vomiting, unspecified: Secondary | ICD-10-CM | POA: Diagnosis not present

## 2023-05-11 DIAGNOSIS — F1010 Alcohol abuse, uncomplicated: Secondary | ICD-10-CM | POA: Diagnosis not present

## 2023-05-11 LAB — BASIC METABOLIC PANEL
Anion gap: 11 (ref 5–15)
BUN: 8 mg/dL (ref 6–20)
CO2: 20 mmol/L — ABNORMAL LOW (ref 22–32)
Calcium: 9 mg/dL (ref 8.9–10.3)
Chloride: 107 mmol/L (ref 98–111)
Creatinine, Ser: 0.73 mg/dL (ref 0.44–1.00)
GFR, Estimated: >60 mL/min (ref >60)
Glucose, Bld: 91 mg/dL (ref 70–99)
Potassium: 3.9 mmol/L (ref 3.5–5.1)
Sodium: 138 mmol/L (ref 135–145)

## 2023-05-11 LAB — CBC
HCT: 31.2 % — ABNORMAL LOW (ref 36.0–46.0)
Hemoglobin: 10.1 g/dL — ABNORMAL LOW (ref 12.0–15.0)
MCH: 27.7 pg (ref 26.0–34.0)
MCHC: 32.4 g/dL (ref 30.0–36.0)
MCV: 85.5 fL (ref 80.0–100.0)
Platelets: 106 10*3/uL — ABNORMAL LOW (ref 150–400)
RBC: 3.65 MIL/uL — ABNORMAL LOW (ref 3.87–5.11)
RDW: 16.7 % — ABNORMAL HIGH (ref 11.5–15.5)
WBC: 3.6 10*3/uL — ABNORMAL LOW (ref 4.0–10.5)
nRBC: 0 % (ref 0.0–0.2)

## 2023-05-11 LAB — PHOSPHORUS: Phosphorus: 2 mg/dL — ABNORMAL LOW (ref 2.5–4.6)

## 2023-05-11 LAB — MAGNESIUM: Magnesium: 1.9 mg/dL (ref 1.7–2.4)

## 2023-05-11 MED ORDER — SODIUM BICARBONATE 650 MG PO TABS: 650.0000 mg | ORAL_TABLET | Freq: Two times a day (BID) | ORAL | 0 refills | Status: AC

## 2023-05-11 MED ORDER — SODIUM BICARBONATE 650 MG PO TABS
650.0000 mg | ORAL_TABLET | Freq: Two times a day (BID) | ORAL | Status: DC
  Administered 2023-05-11: 650 mg via ORAL
  Filled 2023-05-11: qty 1

## 2023-05-11 MED ORDER — SUCRALFATE 1 G PO TABS: 1.0000 g | ORAL_TABLET | Freq: Three times a day (TID) | ORAL | 0 refills | Status: DC

## 2023-05-11 MED ORDER — SUCRALFATE 1 G PO TABS
1.0000 g | ORAL_TABLET | Freq: Three times a day (TID) | ORAL | Status: DC
  Administered 2023-05-11 (×2): 1 g via ORAL
  Filled 2023-05-11 (×2): qty 1

## 2023-05-11 MED ORDER — POTASSIUM & SODIUM PHOSPHATES 280-160-250 MG PO PACK: 1.0000 | PACK | Freq: Three times a day (TID) | ORAL | 0 refills | Status: AC

## 2023-05-11 MED ORDER — POTASSIUM & SODIUM PHOSPHATES 280-160-250 MG PO PACK
1.0000 | PACK | Freq: Three times a day (TID) | ORAL | Status: DC
  Administered 2023-05-11 (×2): 1 via ORAL
  Filled 2023-05-11 (×2): qty 1

## 2023-05-11 MED ORDER — PANTOPRAZOLE SODIUM 40 MG PO TBEC: 40.0000 mg | DELAYED_RELEASE_TABLET | Freq: Every day | ORAL | 0 refills | Status: AC

## 2023-05-11 NOTE — Plan of Care (Signed)
Patient states she is feeling better and was able to keep lunch down without nausea.  Patient requests to be discharged at this time.  MD Zhang notified of patient's request. Problem: Education: Goal: Knowledge of General Education information will improve Description: Including pain rating scale, medication(s)/side effects and non-pharmacologic comfort measures Outcome: Progressing   Problem: Health Behavior/Discharge Planning: Goal: Ability to manage health-related needs will improve Outcome: Progressing   Problem: Clinical Measurements: Goal: Ability to maintain clinical measurements within normal limits will improve Outcome: Progressing Goal: Will remain free from infection Outcome: Progressing Goal: Diagnostic test results will improve Outcome: Progressing Goal: Respiratory complications will improve Outcome: Progressing Goal: Cardiovascular complication will be avoided Outcome: Progressing   Problem: Activity: Goal: Risk for activity intolerance will decrease Outcome: Progressing   Problem: Nutrition: Goal: Adequate nutrition will be maintained Outcome: Progressing   Problem: Coping: Goal: Level of anxiety will decrease Outcome: Progressing   Problem: Elimination: Goal: Will not experience complications related to bowel motility Outcome: Progressing Goal: Will not experience complications related to urinary retention Outcome: Progressing   Problem: Pain Management: Goal: General experience of comfort will improve Outcome: Progressing   Problem: Safety: Goal: Ability to remain free from injury will improve Outcome: Progressing   Problem: Skin Integrity: Goal: Risk for impaired skin integrity will decrease Outcome: Progressing

## 2023-05-11 NOTE — Discharge Summary (Signed)
Physician Discharge Summary   Patient: Yvette [name] MRN: [id number] DOB: [birth year]  Admit date:     05/09/2023  Discharge date: 05/11/23  Discharge Physician: Marrion Coy   PCP: [facility]   Recommendations at discharge:   Follow-up with PCP in 1 week.  Discharge Diagnoses: Active Problems:   Alcoholic ketoacidosis   Intractable vomiting   Elevated LFTs   Thrombocytopenia (HCC)   Alcohol abuse   Hypophosphatemia   Chronic systolic CHF (congestive heart failure) (HCC)   History of Prolonged QT interval   Fatty liver  Resolved Problems:   * No resolved hospital problems. *  Hospital Course: 42 year old female past medical history of alcohol abuse.  She presents with intractable nausea vomiting.    10/29.  Continue IV fluids for alcoholic ketoacidosis.  Patient is treated with IV fluids, symptomatic treatments.  She was also treated with Protonix, sucralfate for gastritis.  Condition much improved, she tolerating diet, medically stable for discharge.  Assessment and Plan:  Alcoholic ketoacidosis Metabolic acidosis. Hypophosphatemia. Patient is treated with IV fluids, condition improving.  Still has nausea, but no vomiting.  No diarrhea or constipation.   Her condition continued to improve, she is tolerating soft diet, she requested to go home.  At this point she is medically stable for discharge. Patient will continue 3 days of sodium bicarb, 3 more doses of Neutra-Phos.   Intractable vomiting likely due to gastritis. Continue on Protonix, added sucralfate.  Advance diet.   Fatty liver Must stop drinking   History of Prolonged QT interval QTc 459.       Chronic systolic CHF (congestive heart failure) (HCC) No signs of heart failure.  Last echocardiogram shows an EF of 60%.     Alcohol abuse Alcoholic hepatitis. 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 Patient did not develop alcohol withdrawal symptoms.       Consultants:  None Procedures performed: None  Disposition: Home Diet recommendation:  Discharge Diet Orders (From admission, onward)     Start     Ordered   05/11/23 0000  Diet - low sodium heart healthy        05/11/23 1638           Cardiac diet DISCHARGE MEDICATION: Allergies as of 05/11/2023       Reactions   Inapsine [droperidol] Anaphylaxis   Zofran [ondansetron] Other (See Comments)   Prolonged QT        Medication List     STOP taking these medications    ferrous sulfate 220 (44 Fe) MG/5ML solution       TAKE these medications    DEPO-PROVERA IM Inject 150 mg into the muscle every 3 (three) months.   folic acid 1 MG tablet Commonly known as: FOLVITE Take 1 tablet (1 mg total) by mouth daily.   naltrexone 50 MG tablet Commonly known as: DEPADE Take 50 mg by mouth daily.   nicotine 7 mg/24hr patch Commonly known as: NICODERM CQ - dosed in mg/24 hr Place 1 patch (7 mg total) onto the skin daily.   nicotine polacrilex 4 MG lozenge Commonly known as: Nicotine Mini Take 1 lozenge (4 mg total) by mouth as needed.   pantoprazole 40 MG tablet Commonly known as: PROTONIX Take 1 tablet (40 mg total) by mouth daily.   potassium & sodium phosphates 280-160-250 MG Pack Commonly known as: PHOS-NAK Take 1 packet by mouth 4 (four) times daily -  before meals and at bedtime for 3 doses.   sodium  bicarbonate 650 MG tablet Take 1 tablet (650 mg total) by mouth 2 (two) times daily for 3 days.   sucralfate 1 g tablet Commonly known as: CARAFATE Take 1 tablet (1 g total) by mouth 4 (four) times daily -  with meals and at bedtime for 7 days.        Follow-up Information     [facility], [facility]. Schedule an appointment as soon as possible for a visit .   Contact information: [address] [place] [address] [phone]                Discharge Exam: Filed Weights   05/09/23 0711  Weight: 53 kg   General exam: Appears calm and  comfortable  Respiratory system: Clear to auscultation. Respiratory effort normal. Cardiovascular system: S1 & S2 heard, RRR. No JVD, murmurs, rubs, gallops or clicks. No pedal edema. Gastrointestinal system: Abdomen is nondistended, soft and nontender. No organomegaly or masses felt. Normal bowel sounds heard. Central nervous system: Alert and oriented. No focal neurological deficits. Extremities: Symmetric 5 x 5 power. Skin: No rashes, lesions or ulcers Psychiatry: Judgement and insight appear normal. Mood & affect appropriate.    Condition at discharge: good  The results of significant diagnostics from this hospitalization (including imaging, microbiology, ancillary and laboratory) are listed below for reference.   Imaging Studies: US Abdomen Limited RUQ (LIVER/GB)  Result Date: 05/10/2023 CLINICAL DATA:  Nausea and vomiting EXAM: ULTRASOUND ABDOMEN LIMITED RIGHT UPPER QUADRANT COMPARISON:  Abdominal ultrasound 01/19/2023, CT abdomen/pelvis 03/30/2022 FINDINGS: Gallbladder: No gallstones or wall thickening visualized. No sonographic Murphy sign noted by sonographer. Common bile duct: Diameter: 1 mm Liver: Parenchymal echogenicity is increased. There are no focal lesions. Portal vein is patent on color Doppler imaging with normal direction of blood flow towards the liver. Other: None. IMPRESSION: Fatty infiltration of the liver.  No acute finding. Electronically Signed   By: Lesia Hausen M.D.   On: 05/10/2023 14:16    Microbiology: Results for orders placed or performed during the hospital encounter of 06/22/22  Resp panel by RT-PCR (RSV, Flu A&B, Covid) Anterior Nasal Swab     Status: None   Collection Time: 06/22/22  4:37 PM   Specimen: Anterior Nasal Swab  Result Value Ref Range Status   SARS Coronavirus 2 by RT PCR NEGATIVE NEGATIVE Final    Comment: (NOTE) SARS-CoV-2 target nucleic acids are NOT DETECTED.  The SARS-CoV-2 RNA is generally detectable in upper respiratory specimens  during the acute phase of infection. The lowest concentration of SARS-CoV-2 viral copies this assay can detect is 138 copies/mL. A negative result does not preclude SARS-Cov-2 infection and should not be used as the sole basis for treatment or other patient management decisions. A negative result may occur with  improper specimen collection/handling, submission of specimen other than nasopharyngeal swab, presence of viral mutation(s) within the areas targeted by this assay, and inadequate number of viral copies(<138 copies/mL). A negative result must be combined with clinical observations, patient history, and epidemiological information. The expected result is Negative.  Fact Sheet for Patients:  BloggerCourse.com  Fact Sheet for Healthcare Providers:  SeriousBroker.it  This test is no t yet approved or cleared by the [place] FDA and  has been authorized for detection and/or diagnosis of SARS-CoV-2 by FDA under an Emergency Use Authorization (EUA). This EUA will remain  in effect (meaning this test can be used) for the duration of the COVID-19 declaration under Section 564(b)(1) of the Act, 21 U.S.C.section  360bbb-3(b)(1), unless the authorization is terminated  or revoked sooner.       Influenza A by PCR NEGATIVE NEGATIVE Final   Influenza B by PCR NEGATIVE NEGATIVE Final    Comment: (NOTE) The Xpert Xpress SARS-CoV-2/FLU/RSV plus assay is intended as an aid in the diagnosis of influenza from Nasopharyngeal swab specimens and should not be used as a sole basis for treatment. Nasal washings and aspirates are unacceptable for Xpert Xpress SARS-CoV-2/FLU/RSV testing.  Fact Sheet for Patients: BloggerCourse.com  Fact Sheet for Healthcare Providers: SeriousBroker.it  This test is not yet approved or cleared by the [place] FDA and has been authorized for detection  and/or diagnosis of SARS-CoV-2 by FDA under an Emergency Use Authorization (EUA). This EUA will remain in effect (meaning this test can be used) for the duration of the COVID-19 declaration under Section 564(b)(1) of the Act, 21 U.S.C. section 360bbb-3(b)(1), unless the authorization is terminated or revoked.     Resp Syncytial Virus by PCR NEGATIVE NEGATIVE Final    Comment: (NOTE) Fact Sheet for Patients: BloggerCourse.com  Fact Sheet for Healthcare Providers: SeriousBroker.it  This test is not yet approved or cleared by the [place] FDA and has been authorized for detection and/or diagnosis of SARS-CoV-2 by FDA under an Emergency Use Authorization (EUA). This EUA will remain in effect (meaning this test can be used) for the duration of the COVID-19 declaration under Section 564(b)(1) of the Act, 21 U.S.C. section 360bbb-3(b)(1), unless the authorization is terminated or revoked.  Performed at [facility], [address] [place], [place] [address]     Labs: CBC: Recent Labs  Lab 05/09/23 0736 05/11/23 0141  WBC 3.9* 3.6*  NEUTROABS 2.7  --   HGB 11.9* 10.1*  HCT 35.4* 31.2*  MCV 81.2 85.5  PLT 155 106*   Basic Metabolic Panel: Recent Labs  Lab 05/09/23 0736 05/10/23 0829 05/11/23 0141  NA 141 143 138  K 3.7 4.6 3.9  CL 107 113* 107  CO2 17* 17* 20*  GLUCOSE 110* 77 91  BUN 8 9 8   CREATININE 0.65 0.76 0.73  CALCIUM 8.8* 9.4 9.0  MG 1.8 1.9 1.9  PHOS  --   --  2.0*   Liver Function Tests: Recent Labs  Lab 05/09/23 0736 05/10/23 0829  AST 367* 121*  ALT 84* 58*  ALKPHOS 77 60  BILITOT 0.6 1.3*  PROT 8.0 7.5  ALBUMIN 4.3 3.9   CBG: No results for input(s): "GLUCAP" in the last 168 hours.  Discharge time spent: greater than 30 minutes.  Signed: Marrion Coy, MD [facility] 05/11/2023

## 2023-05-11 NOTE — Progress Notes (Signed)
Progress Note   Patient: Yvette [name]:[id number] DOB: [birth year] DOA: 05/09/2023     1 DOS: the patient was seen and examined on 05/11/2023   Brief hospital course: 42 year old female past medical history of alcohol abuse.  She presents with intractable nausea vomiting.    10/29.  Continue IV fluids for alcoholic ketoacidosis.     Active Problems:   Alcoholic ketoacidosis   Intractable vomiting   Elevated LFTs   Thrombocytopenia (HCC)   Alcohol abuse   Hypophosphatemia   Chronic systolic CHF (congestive heart failure) (HCC)   History of Prolonged QT interval   Fatty liver   Assessment and Plan: Alcoholic ketoacidosis Metabolic acidosis. Hypophosphatemia. Patient is treated with IV fluids, condition improving.  Still has nausea, but no vomiting.  No diarrhea or constipation.  Will advance diet to soft.  Most likely discharge home tomorrow if tolerating diet.  Intractable vomiting likely due to gastritis. Continue on Protonix, added sucralfate.  Advance diet.  Fatty liver Must stop drinking  History of Prolonged QT interval QTc 459.  Continue to follow.   Chronic systolic CHF (congestive heart failure) (HCC) No signs of heart failure.  Last echocardiogram shows an EF of 60%.   Alcohol abuse Alcoholic hepatitis. Alcohol withdrawal protocol       Subjective:  Patient still has some nausea, no vomiting.  Physical Exam: Vitals:   05/10/23 1712 05/10/23 2012 05/11/23 0401 05/11/23 0811  BP: (!) 148/86 (!) 156/82 (!) 167/90 (!) 150/78  Pulse: 81 (!) 50 (!) 47 (!) 44  Resp: 18 18 18 18   Temp: 99.6 F (37.6 C) 99.7 F (37.6 C) 98.6 F (37 C) 98.1 F (36.7 C)  TempSrc: Oral     SpO2: 100% 100% 100% 99%  Weight:      Height:       General exam: Appears calm and comfortable  Respiratory system: Clear to auscultation. Respiratory effort normal. Cardiovascular system: S1 & S2 heard, RRR. No JVD, murmurs, rubs, gallops or clicks. No pedal  edema. Gastrointestinal system: Abdomen is nondistended, soft and nontender. No organomegaly or masses felt. Normal bowel sounds heard. Central nervous system: Alert and oriented. No focal neurological deficits. Extremities: Symmetric 5 x 5 power. Skin: No rashes, lesions or ulcers Psychiatry: Judgement and insight appear normal. Mood & affect appropriate.    Data Reviewed:  Lab results reviewed.  Family Communication: None  Disposition: Status is: Inpatient Remains inpatient appropriate because: Severity of disease.     Time spent: 35 minutes  Author: Marrion Coy, MD 05/11/2023 1:11 PM  For on call review [email or web address].

## 2023-05-16 ENCOUNTER — Other Ambulatory Visit: Payer: Self-pay

## 2023-05-16 ENCOUNTER — Observation Stay
Admission: EM | Admit: 2023-05-16 | Discharge: 2023-05-17 | Disposition: A | Discharge status: Home / Self Care | Payer: Medicaid Other | Attending: Internal Medicine | Admitting: Internal Medicine

## 2023-05-16 ENCOUNTER — Ambulatory Visit: Payer: Medicaid Other

## 2023-05-16 DIAGNOSIS — F1020 Alcohol dependence, uncomplicated: Secondary | ICD-10-CM | POA: Diagnosis not present

## 2023-05-16 DIAGNOSIS — F17210 Nicotine dependence, cigarettes, uncomplicated: Secondary | ICD-10-CM | POA: Insufficient documentation

## 2023-05-16 DIAGNOSIS — F1010 Alcohol abuse, uncomplicated: Secondary | ICD-10-CM

## 2023-05-16 DIAGNOSIS — K700 Alcoholic fatty liver: Secondary | ICD-10-CM | POA: Insufficient documentation

## 2023-05-16 DIAGNOSIS — F10129 Alcohol abuse with intoxication, unspecified: Secondary | ICD-10-CM | POA: Insufficient documentation

## 2023-05-16 DIAGNOSIS — R1115 Cyclical vomiting syndrome unrelated to migraine: Principal | ICD-10-CM

## 2023-05-16 DIAGNOSIS — K760 Fatty (change of) liver, not elsewhere classified: Secondary | ICD-10-CM | POA: Diagnosis present

## 2023-05-16 DIAGNOSIS — D500 Iron deficiency anemia secondary to blood loss (chronic): Secondary | ICD-10-CM | POA: Diagnosis not present

## 2023-05-16 DIAGNOSIS — I110 Hypertensive heart disease with heart failure: Secondary | ICD-10-CM | POA: Insufficient documentation

## 2023-05-16 DIAGNOSIS — Z79899 Other long term (current) drug therapy: Secondary | ICD-10-CM | POA: Diagnosis not present

## 2023-05-16 DIAGNOSIS — K3189 Other diseases of stomach and duodenum: Secondary | ICD-10-CM | POA: Diagnosis not present

## 2023-05-16 DIAGNOSIS — K449 Diaphragmatic hernia without obstruction or gangrene: Secondary | ICD-10-CM | POA: Diagnosis not present

## 2023-05-16 DIAGNOSIS — J45909 Unspecified asthma, uncomplicated: Secondary | ICD-10-CM | POA: Insufficient documentation

## 2023-05-16 DIAGNOSIS — I5022 Chronic systolic (congestive) heart failure: Secondary | ICD-10-CM | POA: Insufficient documentation

## 2023-05-16 DIAGNOSIS — Z8674 Personal history of sudden cardiac arrest: Secondary | ICD-10-CM | POA: Diagnosis not present

## 2023-05-16 DIAGNOSIS — R1110 Vomiting, unspecified: Principal | ICD-10-CM

## 2023-05-16 DIAGNOSIS — E8729 Other acidosis: Secondary | ICD-10-CM | POA: Diagnosis not present

## 2023-05-16 LAB — CBC
HCT: 33.8 % — ABNORMAL LOW (ref 36.0–46.0)
Hemoglobin: 11.3 g/dL — ABNORMAL LOW (ref 12.0–15.0)
MCH: 27.6 pg (ref 26.0–34.0)
MCHC: 33.4 g/dL (ref 30.0–36.0)
MCV: 82.4 fL (ref 80.0–100.0)
Platelets: 170 10*3/uL (ref 150–400)
RBC: 4.1 MIL/uL (ref 3.87–5.11)
RDW: 16.5 % — ABNORMAL HIGH (ref 11.5–15.5)
WBC: 4.3 10*3/uL (ref 4.0–10.5)
nRBC: 0 % (ref 0.0–0.2)

## 2023-05-16 LAB — HEPATIC FUNCTION PANEL
ALT: 63 U/L — ABNORMAL HIGH (ref 0–44)
AST: 129 U/L — ABNORMAL HIGH (ref 15–41)
Albumin: 4.2 g/dL (ref 3.5–5.0)
Alkaline Phosphatase: 71 U/L (ref 38–126)
Bilirubin, Direct: 0.4 mg/dL — ABNORMAL HIGH (ref 0.0–0.2)
Indirect Bilirubin: 0.9 mg/dL (ref 0.3–0.9)
Total Bilirubin: 1.3 mg/dL — ABNORMAL HIGH (ref <1.2)
Total Protein: 8 g/dL (ref 6.5–8.1)

## 2023-05-16 LAB — BASIC METABOLIC PANEL
Anion gap: 18 — ABNORMAL HIGH (ref 5–15)
BUN: 7 mg/dL (ref 6–20)
CO2: 18 mmol/L — ABNORMAL LOW (ref 22–32)
Calcium: 8.4 mg/dL — ABNORMAL LOW (ref 8.9–10.3)
Chloride: 105 mmol/L (ref 98–111)
Creatinine, Ser: 0.55 mg/dL (ref 0.44–1.00)
GFR, Estimated: >60 mL/min (ref >60)
Glucose, Bld: 113 mg/dL — ABNORMAL HIGH (ref 70–99)
Potassium: 3.4 mmol/L — ABNORMAL LOW (ref 3.5–5.1)
Sodium: 141 mmol/L (ref 135–145)

## 2023-05-16 LAB — LACTIC ACID, PLASMA: Lactic Acid, Venous: 4.6 mmol/L (ref 0.5–1.9)

## 2023-05-16 LAB — LIPASE, BLOOD: Lipase: 21 U/L (ref 11–51)

## 2023-05-16 MED ORDER — METOCLOPRAMIDE HCL 5 MG/ML IJ SOLN
10.0000 mg | Freq: Once | INTRAMUSCULAR | Status: AC
  Administered 2023-05-16: 10 mg via INTRAVENOUS
  Filled 2023-05-16: qty 2

## 2023-05-16 MED ORDER — LACTATED RINGERS IV SOLN: INTRAVENOUS | Status: AC

## 2023-05-16 MED ORDER — POLYETHYLENE GLYCOL 3350 17 G PO PACK: 17.0000 g | PACK | Freq: Every day | ORAL | Status: DC | PRN

## 2023-05-16 MED ORDER — FOLIC ACID 1 MG PO TABS
1.0000 mg | ORAL_TABLET | Freq: Every day | ORAL | Status: DC
  Administered 2023-05-17: 1 mg via ORAL
  Filled 2023-05-16: qty 1

## 2023-05-16 MED ORDER — THIAMINE HCL 100 MG/ML IJ SOLN: 100.0000 mg | Freq: Every day | INTRAMUSCULAR | Status: DC

## 2023-05-16 MED ORDER — THIAMINE MONONITRATE 100 MG PO TABS
100.0000 mg | ORAL_TABLET | Freq: Every day | ORAL | Status: DC
  Administered 2023-05-17: 100 mg via ORAL
  Filled 2023-05-16: qty 1

## 2023-05-16 MED ORDER — PROCHLORPERAZINE EDISYLATE 10 MG/2ML IJ SOLN
10.0000 mg | Freq: Once | INTRAMUSCULAR | Status: AC
  Administered 2023-05-16: 10 mg via INTRAMUSCULAR
  Filled 2023-05-16: qty 2

## 2023-05-16 MED ORDER — PROCHLORPERAZINE MALEATE 10 MG PO TABS
10.0000 mg | ORAL_TABLET | Freq: Once | ORAL | Status: DC
  Filled 2023-05-16: qty 1

## 2023-05-16 MED ORDER — ADULT MULTIVITAMIN W/MINERALS CH
1.0000 | ORAL_TABLET | Freq: Every day | ORAL | Status: DC
  Administered 2023-05-17: 1 via ORAL
  Filled 2023-05-16: qty 1

## 2023-05-16 MED ORDER — PANTOPRAZOLE SODIUM 40 MG PO TBEC
40.0000 mg | DELAYED_RELEASE_TABLET | Freq: Every day | ORAL | Status: DC
  Administered 2023-05-17: 40 mg via ORAL
  Filled 2023-05-16: qty 1

## 2023-05-16 MED ORDER — NALTREXONE HCL 50 MG PO TABS
50.0000 mg | ORAL_TABLET | Freq: Every day | ORAL | Status: DC
  Administered 2023-05-17: 50 mg via ORAL
  Filled 2023-05-16: qty 1

## 2023-05-16 MED ORDER — ENOXAPARIN SODIUM 40 MG/0.4ML IJ SOSY
40.0000 mg | PREFILLED_SYRINGE | INTRAMUSCULAR | Status: DC
  Administered 2023-05-17: 40 mg via SUBCUTANEOUS
  Filled 2023-05-16: qty 0.4

## 2023-05-16 MED ORDER — LACTATED RINGERS IV BOLUS
1000.0000 mL | Freq: Once | INTRAVENOUS | Status: AC
  Administered 2023-05-16: 1000 mL via INTRAVENOUS

## 2023-05-16 MED ORDER — POTASSIUM CHLORIDE CRYS ER 20 MEQ PO TBCR
20.0000 meq | EXTENDED_RELEASE_TABLET | Freq: Once | ORAL | Status: AC
  Administered 2023-05-17: 20 meq via ORAL
  Filled 2023-05-16: qty 1

## 2023-05-16 MED ORDER — LORAZEPAM 2 MG/ML IJ SOLN
0.5000 mg | Freq: Once | INTRAMUSCULAR | Status: AC
  Administered 2023-05-16: 0.5 mg via INTRAVENOUS
  Filled 2023-05-16: qty 1

## 2023-05-16 MED ORDER — ACETAMINOPHEN 650 MG RE SUPP: 650.0000 mg | Freq: Four times a day (QID) | RECTAL | Status: DC | PRN

## 2023-05-16 MED ORDER — ACETAMINOPHEN 325 MG PO TABS: 650.0000 mg | ORAL_TABLET | Freq: Four times a day (QID) | ORAL | Status: DC | PRN

## 2023-05-16 MED ORDER — SODIUM CHLORIDE 0.9% FLUSH
3.0000 mL | Freq: Two times a day (BID) | INTRAVENOUS | Status: DC
  Administered 2023-05-17: 3 mL via INTRAVENOUS

## 2023-05-16 NOTE — Assessment & Plan Note (Signed)
-   Telemetry monitoring given extremely high risk for QT prolongation

## 2023-05-16 NOTE — Assessment & Plan Note (Addendum)
History of cyclic vomiting syndrome of unknown etiology with normal findings on endoscopy and colonoscopy earlier today.  Suspect her alcohol use disorder is playing a large role here, although it may have been triggered by anesthesia earlier today.  Continues to actively vomit during examination.   - S/p one-time dose of Reglan and Compazine - Trial of low-dose Ativan - If ineffective, will trial olanzapine versus Palonosetron given their lack of QT prolonging side effect - Counseled patient regarding alcohol cessation

## 2023-05-16 NOTE — Progress Notes (Signed)
PHARMACY CONSULT NOTE - ELECTROLYTES  Pharmacy Consult for Electrolyte Monitoring and Replacement   Recent Labs: Height: 5\' 1"  (154.9 cm) Weight: 52 kg (114 lb 10.2 oz) IBW/kg (Calculated) : 47.8 Estimated Creatinine Clearance: 71.2 mL/min (by C-G formula based on SCr of 0.55 mg/dL). Potassium (mmol/L)  Date Value  05/16/2023 3.4 (L)   Magnesium (mg/dL)  Date Value  16/04/9603 1.9   Calcium (mg/dL)  Date Value  54/03/8118 8.4 (L)   Albumin (g/dL)  Date Value  14/78/2956 4.2  02/05/2021 4.9 (H)   Phosphorus (mg/dL)  Date Value  21/30/8657 2.0 (L)   Sodium (mmol/L)  Date Value  05/16/2023 141  02/05/2021 141   Corrected Ca: 8.2 mg/dL  Assessment  Yvette [name] is a 42 y.o. female presenting with N/V. 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 Pharmacy has been consulted to monitor and replace electrolytes.  Diet: PO- clear liquids MIVF: LR @ 100 mL/hr Pertinent medications: None  Goal of Therapy: Electrolytes WNL  Plan:  Will give KCL 20 mEq PO x 1 Check BMP, Mg, Phos with AM labs  Thank you for allowing pharmacy to be a part of this patient's care.  Merryl Hacker, PharmD Clinical Pharmacist 05/16/2023 9:54 PM

## 2023-05-16 NOTE — ED Notes (Signed)
Vicente Males, MD notified of critical lactic acid of 4.6

## 2023-05-16 NOTE — Assessment & Plan Note (Signed)
Metabolic acidosis with elevated anion gap in the setting of lactic acidosis.  Likely due to a combination of starvation and alcoholic.  - S/p 1 L bolus - Continue maintenance fluids - Recheck lactic acid in 4 hours

## 2023-05-16 NOTE — H&P (Addendum)
History and Physical    Patient: Yvette [name] ZOX:[id number] DOB: 30-Jan-1981 DOA: 05/16/2023 DOS: the patient was seen and examined on 05/16/2023 PCP: Jodi Marble, NP  Patient coming from: Home  Chief Complaint:  Chief Complaint  Patient presents with   Post-op Problem   HPI: Yvette [name] is a 42 y.o. 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  Yvette [name] states that she underwent an endoscopy and colonoscopy earlier today.  Afterwards, she developed persistent nausea with vomiting. She also endorses generalized weakness. She denies any hematemesis, abdominal pain, diarrhea, melena, hematochezia.  She notes that she drinks alcohol most days, usually approximately 48 ounces of beer per day with her last drink yesterday.  She notes that yesterday, she was in her usual state of health.  ED course: On arrival to the ED, patient was hypertensive at 137/91 with heart rate of 84.  She was saturating at 99% on room air.  She was afebrile at 98. Initial workup demonstrated hemoglobin of 11.3, potassium 3.4, bicarb 18, glucose 113, creatinine 0.55, anion gap of 18 and GFR above 60.  Lipase 21.  Lactic acid pending.  Patient given IV fluids, Reglan and Compazine without improvement.  TRH contacted for admission.  Review of Systems: As mentioned in the history of present illness. All other systems reviewed and are negative.  Past Medical History:  Diagnosis Date   Asthma    Cardiac arrest (HCC)    ETOH abuse    H/O Kawasaki's disease    as 36 month old child and at 57 years   History of anemia    History of blood transfusion 1984   Hypokalemia     Hypomagnesemia    a. in the setting of n/v->gastritis/etoh.   Leukopenia    Neutropenia (HCC)    NICM (nonischemic cardiomyopathy) (HCC)    a. 06/2021 Echo: EF 50-55%, [name]; b. 09/2021 ETT: Ex time 6:19, [name], [name]; c. 03/2022 Echo: EF 35-40%, [name]; d. 08/2022 Echo: EF 60-65%, no rwma, [name].   NSVT (nonsustained ventricular tachycardia) (HCC)    a. Noted during admission 06/2021 assoc w/ palpitations and presyncope in the setting of profound QT prolongation/gastritis.   Tobacco abuse    Transaminitis    Past Surgical History:  Procedure Laterality Date   ESOPHAGOGASTRODUODENOSCOPY N/A 10/29/2020   Procedure: ESOPHAGOGASTRODUODENOSCOPY (EGD);  Surgeon: Wyline Mood, MD;  Location: [facility] ENDOSCOPY;  Service: Gastroenterology;  [name];   ESOPHAGOGASTRODUODENOSCOPY (EGD) WITH PROPOFOL N/A 03/14/2020   Procedure: ESOPHAGOGASTRODUODENOSCOPY (EGD) WITH PROPOFOL;  Surgeon: Pasty Spillers, MD;  [name];  Service: Endoscopy;  [name];   ESOPHAGOGASTRODUODENOSCOPY (EGD) WITH PROPOFOL N/A 02/25/2021   Procedure: ESOPHAGOGASTRODUODENOSCOPY (EGD) WITH PROPOFOL;  Surgeon: Pasty Spillers, MD;  [name];  Service: Endoscopy;  [name];   ESOPHAGOGASTRODUODENOSCOPY (EGD) WITH PROPOFOL N/A 06/25/2022   Procedure: ESOPHAGOGASTRODUODENOSCOPY (EGD) WITH PROPOFOL;  Surgeon: Jaynie Collins, DO;  Location: [facility] ENDOSCOPY;  Service: Gastroenterology;  [name];   Social History:  reports that she has been smoking cigarettes. She has a 2.5 pack-year smoking history. She has never used smokeless tobacco. She reports current alcohol  use of about 61.0 standard drinks of alcohol per week. She reports that she does not use drugs.  Allergies  Allergen Reactions   Inapsine [Droperidol] Anaphylaxis   Zofran [Ondansetron] Other (See Comments)    Prolonged QT    Family History  Problem  Relation Age of Onset   Lupus Mother    Prostate cancer Father    Cancer Paternal Aunt        Breast    Cancer Paternal Aunt        Breast     Prior to Admission medications   Medication Sig Start Date End Date Taking? Authorizing Provider  folic acid (FOLVITE) 1 MG tablet Take 1 tablet (1 mg total) by mouth daily. 01/26/23   Earna Coder, MD  medroxyPROGESTERone Acetate (DEPO-PROVERA IM) Inject 150 mg into the muscle every 3 (three) months.    [provider]  naltrexone (DEPADE) 50 MG tablet Take 50 mg by mouth daily.    [provider]  nicotine (NICODERM CQ - DOSED IN MG/24 HR) 7 mg/24hr patch Place 1 patch (7 mg total) onto the skin daily. 05/09/23 05/08/24  Pilar Jarvis, MD  nicotine polacrilex (NICOTINE MINI) 4 MG lozenge Take 1 lozenge (4 mg total) by mouth as needed. 05/09/23   Pilar Jarvis, MD  pantoprazole (PROTONIX) 40 MG tablet Take 1 tablet (40 mg total) by mouth daily. 05/11/23   Marrion Coy, MD  sucralfate (CARAFATE) 1 g tablet Take 1 tablet (1 g total) by mouth 4 (four) times daily -  with meals and at bedtime for 7 days. 05/11/23 05/18/23  Marrion Coy, MD    Physical Exam: Vitals:   05/16/23 1233 05/16/23 1244 05/16/23 1549  BP: (!) 112/95  (!) 137/91  Pulse: (!) 134 77 84  Resp: 20  19  Temp: 98.6 F (37 C)  98 F (36.7 C)  SpO2: 95%  99%  Weight: 52 kg    Height: 5\' 1"  (1.549 m)     Physical Exam Vitals and nursing note reviewed.  Constitutional:      General: She is not in acute distress. HENT:     Head: Normocephalic and atraumatic.  Cardiovascular:     Rate and Rhythm: Normal rate and regular rhythm.     Heart sounds: No murmur heard. Pulmonary:     Effort: Pulmonary effort is normal. No respiratory distress.     Breath sounds: Normal breath sounds. No wheezing, rhonchi or rales.  Abdominal:     Palpations: Abdomen is soft.     Tenderness: There is no abdominal tenderness.  Musculoskeletal:     Right lower leg: No edema.      Left lower leg: No edema.  Neurological:     General: No focal deficit present.     Mental Status: She is alert. Mental status is at baseline.  Psychiatric:        Mood and Affect: Mood normal.        Behavior: Behavior normal.    Data Reviewed: CBC with WBC of 4.3, hemoglobin of 11.3, MCV of 82.4, platelets of 170 BMP with sodium of 141, potassium 3.4, bicarb 18, glucose 113, BUN 7, creatinine 0.55, anion gap 18 and GFR above 60 Lipase 21  Results are pending, will review when available.  Assessment and Plan:  * Cyclic vomiting syndrome History of cyclic vomiting syndrome of unknown etiology with normal findings on endoscopy and colonoscopy earlier today.  Suspect her alcohol use disorder is playing a large role  here, although it may have been triggered by anesthesia earlier today.  Continues to actively vomit during examination.   - S/p one-time dose of Reglan and Compazine - Trial of low-dose Ativan - If ineffective, will trial olanzapine versus Palonosetron given their lack of QT prolonging side effect - Counseled patient regarding alcohol cessation  Alcohol use disorder, severe, dependence (HCC) Patient continues to drink daily despite multiple complications including fatty liver disease, cytopenias, and cyclic vomiting with complications of V-fib arrest x 2.  - CIWA monitoring without Ativan - Daily thiamine, folic acid and multivitamin - Patient declines TOC resources  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 03/2022 - Telemetry monitoring given extremely high risk for QT prolongation  Ketoacidosis Metabolic acidosis with elevated anion gap in the setting of lactic acidosis.  Likely due to a combination of starvation and alcoholic.  - S/p 1 L bolus - Continue maintenance fluids - Recheck lactic acid in 4 hours  Fatty liver Per most recent GI note, patient does not have a history of cirrhosis but rather fatty liver disease.  High risk for developing  cirrhosis in the setting of ongoing alcohol use.  - Hepatic function panel pending  Chronic systolic CHF (congestive heart failure) (HCC) History of HFrEF with recovered EF in the setting of V-fib arrest.  She appears euvolemic at this time.  Advance Care Planning:   Code Status: Full Code   Consults: None  Family Communication: Patient's family not at bedside  Severity of Illness: The appropriate patient status for this patient is OBSERVATION. Observation status is judged to be reasonable and necessary in order to provide the required intensity of service to ensure the patient's safety. The patient's presenting symptoms, physical exam findings, and initial radiographic and laboratory data in the context of their medical condition is felt to place them at decreased risk for further clinical deterioration. Furthermore, it is anticipated that the patient will be medically stable for discharge from the hospital within 2 midnights of admission.   Author: Verdene Lennert, MD 05/16/2023 9:47 PM  For on call review [email or web address].

## 2023-05-16 NOTE — ED Notes (Signed)
Pt moved to room 31.  Report off to julie rn cpod nurse   pt waiting on admission

## 2023-05-16 NOTE — Assessment & Plan Note (Signed)
History of HFrEF with recovered EF in the setting of V-fib arrest.  She appears euvolemic at this time.

## 2023-05-16 NOTE — ED Provider Notes (Signed)
[facility] Provider Note   Event Date/Time   First MD Initiated Contact with Patient 05/16/23 1759     (approximate) History  Post-op Problem  HPI Yvette [name] is a 42 y.o. female with a history of alcohol abuse as well as alcoholic ketoacidosis secondary to vomiting who presents after an endoscopy/colonoscopy that was done earlier today did not show any signs of acute abnormalities complaining of persistent vomiting.  Patient has gotten 2 doses of Compazine with no relief and continued emesis. ROS: Patient currently denies any vision changes, tinnitus, difficulty speaking, facial droop, sore throat, chest pain, shortness of breath, abdominal pain, diarrhea, dysuria, or weakness/numbness/paresthesias in any extremity   Physical Exam  Triage Vital Signs: ED Triage Vitals [05/16/23 1233]  Encounter Vitals Group     BP (!) 112/95     Systolic BP Percentile      Diastolic BP Percentile      Pulse Rate (!) 134     Resp 20     Temp 98.6 F (37 C)     Temp src      SpO2 95 %     Weight 114 lb 10.2 oz (52 kg)     Height 5\' 1"  (1.549 m)     Head Circumference      Peak Flow      Pain Score 0     Pain Loc      Pain Education      Exclude from Growth Chart    Most recent vital signs: Vitals:   05/16/23 1244 05/16/23 1549  BP:  (!) 137/91  Pulse: 77 84  Resp:  19  Temp:  98 F (36.7 C)  SpO2:  99%   General: Awake, oriented x4. CV:  Good peripheral perfusion.  Resp:  Normal effort.  Abd:  No distention.  Midepigastric tenderness to palpation Other:  Middle-aged well-developed, well-nourished African-American female resting comfortably in no acute distress ED Results / Procedures / Treatments  Labs (all labs ordered are listed, but only abnormal results are displayed) Labs Reviewed  CBC - Abnormal; Notable for the following components:      Result Value   Hemoglobin 11.3 (*)    HCT 33.8 (*)    RDW 16.5 (*)    All other components within normal  limits  BASIC METABOLIC PANEL - Abnormal; Notable for the following components:   Potassium 3.4 (*)    CO2 18 (*)    Glucose, Bld 113 (*)    Calcium 8.4 (*)    Anion gap 18 (*)    All other components within normal limits  LACTIC ACID, PLASMA - Abnormal; Notable for the following components:   Lactic Acid, Venous 4.6 (*)    All other components within normal limits  HEPATIC FUNCTION PANEL - Abnormal; Notable for the following components:   AST 129 (*)    ALT 63 (*)    Total Bilirubin 1.3 (*)    Bilirubin, Direct 0.4 (*)    All other components within normal limits  LIPASE, BLOOD  URINE DRUG SCREEN, QUALITATIVE ([facility] ONLY)  HIV ANTIBODY (ROUTINE TESTING W REFLEX)  COMPREHENSIVE METABOLIC PANEL  PHOSPHORUS  MAGNESIUM   PROCEDURES: Critical Care performed: Yes, see critical care procedure note(s) Procedures CRITICAL CARE Performed by: Merwyn Katos  Total critical care time: 41 minutes  Critical care time was exclusive of separately billable procedures and treating other patients.  Critical care was necessary to treat or prevent imminent or life-threatening  deterioration.  Critical care was time spent personally by me on the following activities: development of treatment plan with patient and/or surrogate as well as nursing, discussions with consultants, evaluation of patient's response to treatment, examination of patient, obtaining history from patient or surrogate, ordering and performing treatments and interventions, ordering and review of laboratory studies, ordering and review of radiographic studies, pulse oximetry and re-evaluation of patient's condition.  MEDICATIONS ORDERED IN ED: Medications  enoxaparin (LOVENOX) injection 40 mg (has no administration in time range)  sodium chloride flush (NS) 0.9 % injection 3 mL (has no administration in time range)  acetaminophen (TYLENOL) tablet 650 mg (has no administration in time range)    Or  acetaminophen (TYLENOL)  suppository 650 mg (has no administration in time range)  polyethylene glycol (MIRALAX / GLYCOLAX) packet 17 g (has no administration in time range)  thiamine (VITAMIN B1) tablet 100 mg (has no administration in time range)    Or  thiamine (VITAMIN B1) injection 100 mg (has no administration in time range)  folic acid (FOLVITE) tablet 1 mg (has no administration in time range)  multivitamin with minerals tablet 1 tablet (has no administration in time range)  naltrexone (DEPADE) tablet 50 mg (has no administration in time range)  pantoprazole (PROTONIX) EC tablet 40 mg (has no administration in time range)  lactated ringers infusion (has no administration in time range)  prochlorperazine (COMPAZINE) injection 10 mg (10 mg Intramuscular Given 05/16/23 1248)  prochlorperazine (COMPAZINE) injection 10 mg (10 mg Intramuscular Given 05/16/23 1930)  metoCLOPramide (REGLAN) injection 10 mg (10 mg Intravenous Given 05/16/23 2030)  lactated ringers bolus 1,000 mL (1,000 mLs Intravenous New Bag/Given 05/16/23 2031)  LORazepam (ATIVAN) injection 0.5 mg (0.5 mg Intravenous Given 05/16/23 2140)   IMPRESSION / MDM / ASSESSMENT AND PLAN / ED COURSE  I reviewed the triage vital signs and the nursing notes.                             The patient is on the cardiac monitor to evaluate for evidence of arrhythmia and/or significant heart rate changes. Patient's presentation is most consistent with acute presentation with potential threat to life or bodily function. Patient presents for acute nausea/vomiting  Given History and Exam there does not appear to be an emergent cause of the symptoms such as small bowel obstruction, coronary syndrome, bowel ischemia, DKA, pancreatitis, appendicitis, other acute abdomen or other emergent problem.  Reassessment: After treatment, the patient is still feeling extremely nauseous despite multiple rounds of Compazine and Reglan.  I discussed with patient importance of using adjunct  medications including Ativan, Haldol, or prednisone which patient refused.  I spoke to Dr. Huel Cote on the internal medicine service who is graciously agreed to accept this patient onto her service for further management of patient's intractable nausea and vomiting  Disposition: Admit to medicine   FINAL CLINICAL IMPRESSION(S) / ED DIAGNOSES   Final diagnoses:  Intractable vomiting  Alcohol abuse  Alcoholic ketoacidosis   Rx / DC Orders   ED Discharge Orders     None      Note:  This document was prepared using Dragon voice recognition software and may include unintentional dictation errors.   Merwyn Katos, MD 05/16/23 2154

## 2023-05-16 NOTE — ED Provider Triage Note (Signed)
Emergency Medicine Provider Triage Evaluation Note  Yvette [name] , a 42 y.o. female  was evaluated in triage.  Pt complains of vomiting, just had endoscopy, had proprofol, now drowsy and vomiting, [facility].  Review of Systems  Positive:  Negative:   Physical Exam  BP (!) 112/95   Pulse (!) 134   Temp 98.6 F (37 C)   Resp 20   SpO2 95%  Gen:   Awake, no distress   Resp:  Normal effort  MSK:   Moves extremities without difficulty  Other:    Medical Decision Making  Medically screening exam initiated at 12:43 PM.  Appropriate orders placed.  Yvette [name] was informed that the remainder of the evaluation will be completed by another provider, this initial triage assessment does not replace that evaluation, and the importance of remaining in the ED until their evaluation is complete.     Faythe Ghee, PA-C 05/16/23 1244

## 2023-05-16 NOTE — ED Notes (Signed)
Unsuccessful phlebotomy attempt. Lab called

## 2023-05-16 NOTE — ED Triage Notes (Signed)
Pt to ED from [facility] for post op nausea. Family reports pt had colonscopy and endoscopy today and was sedated with 400mg  propofol at 1010am.  Pt is allergic to zofran with hx of prolonged QT interval and cardiac arrest.  Pt denies pain

## 2023-05-16 NOTE — Assessment & Plan Note (Signed)
Patient continues to drink daily despite multiple complications including fatty liver disease, cytopenias, and cyclic vomiting with complications of V-fib arrest x 2.  - CIWA monitoring without Ativan - Daily thiamine, folic acid and multivitamin - Patient declines TOC resources

## 2023-05-16 NOTE — ED Notes (Signed)
See triage notes. Patient had a colonoscopy/endoscopy this morning. Patient c/o nausea/vomiting.

## 2023-05-16 NOTE — ED Notes (Signed)
Lab called again for labs for pt

## 2023-05-16 NOTE — Assessment & Plan Note (Signed)
Per most recent GI note, patient does not have a history of cirrhosis but rather fatty liver disease.  High risk for developing cirrhosis in the setting of ongoing alcohol use.  - Hepatic function panel pending

## 2023-05-17 ENCOUNTER — Encounter: Payer: Self-pay | Admitting: Internal Medicine

## 2023-05-17 DIAGNOSIS — F1010 Alcohol abuse, uncomplicated: Secondary | ICD-10-CM

## 2023-05-17 DIAGNOSIS — R1110 Vomiting, unspecified: Secondary | ICD-10-CM

## 2023-05-17 DIAGNOSIS — R1115 Cyclical vomiting syndrome unrelated to migraine: Secondary | ICD-10-CM | POA: Diagnosis not present

## 2023-05-17 DIAGNOSIS — E8729 Other acidosis: Secondary | ICD-10-CM | POA: Diagnosis not present

## 2023-05-17 LAB — COMPREHENSIVE METABOLIC PANEL
ALT: 50 U/L — ABNORMAL HIGH (ref 0–44)
AST: 73 U/L — ABNORMAL HIGH (ref 15–41)
Albumin: 3.8 g/dL (ref 3.5–5.0)
Alkaline Phosphatase: 63 U/L (ref 38–126)
Anion gap: 12 (ref 5–15)
BUN: 9 mg/dL (ref 6–20)
CO2: 24 mmol/L (ref 22–32)
Calcium: 8.9 mg/dL (ref 8.9–10.3)
Chloride: 100 mmol/L (ref 98–111)
Creatinine, Ser: 0.59 mg/dL (ref 0.44–1.00)
GFR, Estimated: >60 mL/min (ref >60)
Glucose, Bld: 111 mg/dL — ABNORMAL HIGH (ref 70–99)
Potassium: 3.4 mmol/L — ABNORMAL LOW (ref 3.5–5.1)
Sodium: 136 mmol/L (ref 135–145)
Total Bilirubin: 0.9 mg/dL (ref <1.2)
Total Protein: 7.2 g/dL (ref 6.5–8.1)

## 2023-05-17 LAB — URINE DRUG SCREEN, QUALITATIVE (ARMC ONLY)
Amphetamines, Ur Screen: NOT DETECTED
Barbiturates, Ur Screen: NOT DETECTED
Benzodiazepine, Ur Scrn: NOT DETECTED
Cannabinoid 50 Ng, Ur ~~LOC~~: NOT DETECTED
Cocaine Metabolite,Ur ~~LOC~~: NOT DETECTED
MDMA (Ecstasy)Ur Screen: NOT DETECTED
Methadone Scn, Ur: NOT DETECTED
Opiate, Ur Screen: NOT DETECTED
Phencyclidine (PCP) Ur S: NOT DETECTED
Tricyclic, Ur Screen: POSITIVE — AB

## 2023-05-17 LAB — PHOSPHORUS: Phosphorus: 3.4 mg/dL (ref 2.5–4.6)

## 2023-05-17 LAB — HIV ANTIBODY (ROUTINE TESTING W REFLEX): HIV Screen 4th Generation wRfx: NONREACTIVE

## 2023-05-17 LAB — MAGNESIUM: Magnesium: 1.5 mg/dL — ABNORMAL LOW (ref 1.7–2.4)

## 2023-05-17 LAB — LACTIC ACID, PLASMA: Lactic Acid, Venous: 2.3 mmol/L (ref 0.5–1.9)

## 2023-05-17 MED ORDER — MAGNESIUM SULFATE 4 GM/100ML IV SOLN
4.0000 g | Freq: Once | INTRAVENOUS | Status: AC
  Administered 2023-05-17: 4 g via INTRAVENOUS
  Filled 2023-05-17: qty 100

## 2023-05-17 MED ORDER — LACTATED RINGERS IV BOLUS
1000.0000 mL | Freq: Once | INTRAVENOUS | Status: AC
  Administered 2023-05-17: 1000 mL via INTRAVENOUS

## 2023-05-17 MED ORDER — VITAMIN B-1 100 MG PO TABS: 100.0000 mg | ORAL_TABLET | Freq: Every day | ORAL | 1 refills | Status: DC

## 2023-05-17 MED ORDER — POTASSIUM CHLORIDE CRYS ER 20 MEQ PO TBCR
40.0000 meq | EXTENDED_RELEASE_TABLET | Freq: Once | ORAL | Status: AC
  Administered 2023-05-17: 40 meq via ORAL
  Filled 2023-05-17: qty 2

## 2023-05-17 NOTE — Hospital Course (Addendum)
Taken from H&P.  Yvette [name] is a 42 y.o. 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   Yvette [name] states that she underwent an endoscopy and colonoscopy earlier today.  Afterwards, she developed persistent nausea with vomiting. She also endorses generalized weakness.She drinks alcohol most days, usually approximately 48 ounces of beer per day with her last drink yesterday.   On arrival to the ED, patient was hypertensive at 137/91 with heart rate of 84.  She was saturating at 99% on room air.  She was afebrile at 98. Initial workup demonstrated hemoglobin of 11.3, potassium 3.4, bicarb 18, glucose 113, creatinine 0.55, anion gap of 18 and GFR above 60.  Lipase 21.  LA 4.6  Patient was given IV fluid, Reglan and Compazine without any significant improvement.  11/5: Vital stable.  No more nausea and vomiting and able to tolerate food.  Labs with hypomagnesemia which are being repleted.  Lactic acid still mildly elevated but much improved to 2.3, patient was given another liter of LR before discharge. UDS positive for tricyclic.  Patient was counseled again for alcohol and smoking cessation.  She will continue with her home medications and need to have a close follow-up with her providers for further management.

## 2023-05-17 NOTE — ED Notes (Signed)
Pt informed of need for urine sample. Pt unable to provide at this time. Pt given ginger ale per request.

## 2023-05-17 NOTE — ED Notes (Signed)
Lab called to collect blood work.

## 2023-05-17 NOTE — Progress Notes (Signed)
PHARMACY CONSULT NOTE - ELECTROLYTES  Pharmacy Consult for Electrolyte Monitoring and Replacement   Recent Labs: Height: 5\' 1"  (154.9 cm) Weight: 52 kg (114 lb 10.2 oz) IBW/kg (Calculated) : 47.8 Estimated Creatinine Clearance: 71.2 mL/min (by C-G formula based on SCr of 0.59 mg/dL). Potassium (mmol/L)  Date Value  05/17/2023 3.4 (L)   Magnesium (mg/dL)  Date Value  29/56/2130 1.5 (L)   Calcium (mg/dL)  Date Value  86/57/8469 8.9   Albumin (g/dL)  Date Value  62/95/2841 3.8  02/05/2021 4.9 (H)   Phosphorus (mg/dL)  Date Value  32/44/0102 3.4   Sodium (mmol/L)  Date Value  05/17/2023 136  02/05/2021 141   Corrected Ca: 8.2 mg/dL  Assessment  Yvette [name] is a 42 y.o. female presenting with N/V. 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 Pharmacy has been consulted to monitor and replace electrolytes.  Diet: PO- clear liquids MIVF: LR @ 100 mL/hr Pertinent medications: None  Goal of Therapy: Electrolytes WNL  Plan:  K 3.4   MD ordered KCL 40 mEq PO x 1 Mag 1.5  MD ordered Magnesium sulfate 4 gm IV x1 Check BMP, Mg, Phos with AM labs  Thank you for allowing pharmacy to be a part of this patient's care.  Angelique Blonder, PharmD Clinical Pharmacist 05/17/2023 9:30 AM

## 2023-05-17 NOTE — ED Notes (Addendum)
Pt  isn't  on tele monitoring

## 2023-05-17 NOTE — Discharge Summary (Signed)
Physician Discharge Summary   Patient: Yvette [name] MRN: [id number] DOB: December 21, 1980  Admit date:     05/16/2023  Discharge date: 05/17/23  Discharge Physician: Arnetha Courser   PCP: Jodi Marble, NP   Recommendations at discharge:  Please obtain CBC, BMP on follow-up Follow-up with primary care provider within a week  Discharge Diagnoses: Principal Problem:   Cyclic vomiting syndrome Active Problems:   Alcohol use disorder, severe, dependence (HCC)   Alcoholic ketoacidosis   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 03/2022   Fatty liver   Chronic systolic CHF (congestive heart failure) (HCC)   Alcohol abuse   Hospital Course: Taken from H&P.  Yvette [name] is a 42 y.o. 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   Yvette [name] states that she underwent an endoscopy and colonoscopy earlier today.  Afterwards, she developed persistent nausea with vomiting. She also endorses generalized weakness.She drinks alcohol most days, usually approximately 48 ounces of beer per day with her last drink yesterday.   On arrival to the ED, patient was hypertensive at 137/91 with heart rate of 84.  She was saturating at 99% on room air.  She was afebrile at 98. Initial workup demonstrated hemoglobin of 11.3, potassium 3.4, bicarb 18, glucose 113, creatinine 0.55, anion gap of 18 and GFR above 60.  Lipase 21.  LA 4.6  Patient was given IV fluid, Reglan and Compazine without any significant improvement.  11/5: Vital stable.  No more nausea and vomiting and able to tolerate food.  Labs with hypomagnesemia which are being repleted.  Lactic acid still  mildly elevated but much improved to 2.3, patient was given another liter of LR before discharge. UDS positive for tricyclic.  Patient was counseled again for alcohol and smoking cessation.  She will continue with her home medications and need to have a close follow-up with her providers for further management.      Assessment and Plan: * Cyclic vomiting syndrome History of cyclic vomiting syndrome of unknown etiology with normal findings on endoscopy and colonoscopy earlier today.  Suspect her alcohol use disorder is playing a large role here, although it may have been triggered by anesthesia earlier today.   Improved and able to tolerate diet.  Alcohol use disorder, severe, dependence (HCC) Patient continues to drink daily despite multiple complications including fatty liver disease, cytopenias, and cyclic vomiting with complications of V-fib arrest x 2.  - CIWA monitoring without Ativan - Daily thiamine, folic acid and multivitamin - Patient declines TOC resources  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 03/2022 - Telemetry monitoring given extremely high risk for QT prolongation  Alcoholic ketoacidosis Metabolic acidosis with elevated anion gap in the setting of lactic acidosis.  Likely due to a combination of starvation and alcoholic. Also had lactic acidosis which was improved but still mildly elevated so she was given another liter of LR  Fatty liver Per most recent GI note, patient does not have a history of cirrhosis but rather fatty liver disease.  High risk for developing cirrhosis in the setting of ongoing alcohol use. Hepatic function seems stable, mild transaminitis and T. bili at 1.3  Chronic systolic CHF (congestive heart failure) (HCC)  History of HFrEF with recovered EF in the setting of V-fib arrest.  She appears euvolemic at this time.  Consultants: None Procedures performed: None Disposition: Home Diet recommendation:  Discharge Diet Orders  (From admission, onward)     Start     Ordered   05/17/23 0000  Diet - low sodium heart healthy        05/17/23 1331           Cardiac diet DISCHARGE MEDICATION: Allergies as of 05/17/2023       Reactions   Inapsine [droperidol] Anaphylaxis   Zofran [ondansetron] Other (See Comments)   Prolonged QT        Medication List     STOP taking these medications    Na Sulfate-K Sulfate-Mg Sulf 17.5-3.13-1.6 GM/177ML Soln   Phos-NaK 280-160-250 MG Pack Generic drug: potassium & sodium phosphates       TAKE these medications    DEPO-PROVERA IM Inject 150 mg into the muscle every 3 (three) months.   folic acid 1 MG tablet Commonly known as: FOLVITE Take 1 tablet (1 mg total) by mouth daily.   naltrexone 50 MG tablet Commonly known as: DEPADE Take 50 mg by mouth daily.   nicotine 7 mg/24hr patch Commonly known as: NICODERM CQ - dosed in mg/24 hr Place 1 patch (7 mg total) onto the skin daily.   nicotine polacrilex 4 MG lozenge Commonly known as: Nicotine Mini Take 1 lozenge (4 mg total) by mouth as needed.   pantoprazole 40 MG tablet Commonly known as: PROTONIX Take 1 tablet (40 mg total) by mouth daily.   sucralfate 1 g tablet Commonly known as: CARAFATE Take 1 tablet (1 g total) by mouth 4 (four) times daily -  with meals and at bedtime for 7 days.   thiamine 100 MG tablet Commonly known as: Vitamin B-1 Take 1 tablet (100 mg total) by mouth daily. Start taking on: May 18, 2023        Follow-up Information     Jodi Marble, NP. Schedule an appointment as soon as possible for a visit in 1 week(s).   Specialty: Nurse Practitioner Contact information: [address] [place] [address] [phone]                Discharge Exam: [name] Weights   05/16/23 1233  Weight: 52 kg   General.  Well-developed lady, in no acute distress. Pulmonary.  Lungs clear bilaterally, normal respiratory effort. CV.  Regular rate and rhythm, no JVD,  rub or murmur. Abdomen.  Soft, nontender, nondistended, BS positive. CNS.  Alert and oriented .  No focal neurologic deficit. Extremities.  No edema, no cyanosis, pulses intact and symmetrical. Psychiatry.  Judgment and insight appears normal.   Condition at discharge: stable  The results of significant diagnostics from this hospitalization (including imaging, microbiology, ancillary and laboratory) are listed below for reference.   Imaging Studies: US Abdomen Limited RUQ (LIVER/GB)  Result Date: 05/10/2023 CLINICAL DATA:  Nausea and vomiting EXAM: ULTRASOUND ABDOMEN LIMITED RIGHT UPPER QUADRANT COMPARISON:  Abdominal ultrasound 01/19/2023, CT abdomen/pelvis 03/30/2022 FINDINGS: Gallbladder: No gallstones or wall thickening visualized. No sonographic Murphy sign noted by sonographer. Common bile duct: Diameter: 1 mm Liver: Parenchymal echogenicity is increased. There are no focal lesions. Portal vein is patent on color Doppler imaging with normal direction of blood flow towards the liver. Other: None. IMPRESSION: Fatty infiltration of the liver.  No acute finding. Electronically Signed   By: Lesia Hausen M.D.   On: 05/10/2023  14:16    Microbiology: Results for orders placed or performed during the hospital encounter of 06/22/22  Resp panel by RT-PCR (RSV, Flu A&B, Covid) Anterior Nasal Swab     Status: None   Collection Time: 06/22/22  4:37 PM   Specimen: Anterior Nasal Swab  Result Value Ref Range Status   SARS Coronavirus 2 by RT PCR NEGATIVE NEGATIVE Final    Comment: (NOTE) SARS-CoV-2 target nucleic acids are NOT DETECTED.  The SARS-CoV-2 RNA is generally detectable in upper respiratory specimens during the acute phase of infection. The lowest concentration of SARS-CoV-2 viral copies this assay can detect is 138 copies/mL. A negative result does not preclude SARS-Cov-2 infection and should not be used as the sole basis for treatment or other patient management decisions. A negative  result may occur with  improper specimen collection/handling, submission of specimen other than nasopharyngeal swab, presence of viral mutation(s) within the areas targeted by this assay, and inadequate number of viral copies(<138 copies/mL). A negative result must be combined with clinical observations, patient history, and epidemiological information. The expected result is Negative.  Fact Sheet for Patients:  BloggerCourse.com  Fact Sheet for Healthcare Providers:  SeriousBroker.it  This test is no t yet approved or cleared by the [place] FDA and  has been authorized for detection and/or diagnosis of SARS-CoV-2 by FDA under an Emergency Use Authorization (EUA). This EUA will remain  in effect (meaning this test can be used) for the duration of the COVID-19 declaration under Section 564(b)(1) of the Act, 21 U.S.C.section 360bbb-3(b)(1), unless the authorization is terminated  or revoked sooner.       Influenza A by PCR NEGATIVE NEGATIVE Final   Influenza B by PCR NEGATIVE NEGATIVE Final    Comment: (NOTE) The Xpert Xpress SARS-CoV-2/FLU/RSV plus assay is intended as an aid in the diagnosis of influenza from Nasopharyngeal swab specimens and should not be used as a sole basis for treatment. Nasal washings and aspirates are unacceptable for Xpert Xpress SARS-CoV-2/FLU/RSV testing.  Fact Sheet for Patients: BloggerCourse.com  Fact Sheet for Healthcare Providers: SeriousBroker.it  This test is not yet approved or cleared by the [place] FDA and has been authorized for detection and/or diagnosis of SARS-CoV-2 by FDA under an Emergency Use Authorization (EUA). This EUA will remain in effect (meaning this test can be used) for the duration of the COVID-19 declaration under Section 564(b)(1) of the Act, 21 U.S.C. section 360bbb-3(b)(1), unless the authorization is  terminated or revoked.     Resp Syncytial Virus by PCR NEGATIVE NEGATIVE Final    Comment: (NOTE) Fact Sheet for Patients: BloggerCourse.com  Fact Sheet for Healthcare Providers: SeriousBroker.it  This test is not yet approved or cleared by the [place] FDA and has been authorized for detection and/or diagnosis of SARS-CoV-2 by FDA under an Emergency Use Authorization (EUA). This EUA will remain in effect (meaning this test can be used) for the duration of the COVID-19 declaration under Section 564(b)(1) of the Act, 21 U.S.C. section 360bbb-3(b)(1), unless the authorization is terminated or revoked.  Performed at [facility] [address] [place], [place] [address]     Labs: CBC: Recent Labs  Lab 05/11/23 0141 05/16/23 1447  WBC 3.6* 4.3  HGB 10.1* 11.3*  HCT 31.2* 33.8*  MCV 85.5 82.4  PLT 106* 170   Basic Metabolic Panel: Recent Labs  Lab 05/11/23 0141 05/16/23 1447 05/17/23 0811  NA 138 141 136  K 3.9 3.4* 3.4*  CL 107 105 100  CO2 20* 18* 24  GLUCOSE 91 113* 111*  BUN 8 7 9   CREATININE 0.73 0.55 0.59  CALCIUM 9.0 8.4* 8.9  MG 1.9  --  1.5*  PHOS 2.0*  --  3.4   Liver Function Tests: Recent Labs  Lab 05/16/23 2025 05/17/23 0811  AST 129* 73*  ALT 63* 50*  ALKPHOS 71 63  BILITOT 1.3* 0.9  PROT 8.0 7.2  ALBUMIN 4.2 3.8   CBG: No results for input(s): "GLUCAP" in the last 168 hours.  Discharge time spent: greater than 30 minutes.  This record has been created using Conservation officer, historic buildings. Errors have been sought and corrected,but may not always be located. Such creation errors do not reflect on the standard of care.   Signed: Arnetha Courser, MD [facility] 05/17/2023

## 2023-05-17 NOTE — ED Notes (Signed)
  Devynne Sturdivant NT II took over tele Monitoring

## 2023-05-18 IMAGING — CT CT ABD-PELV W/ CM
2 of 4 series · 16 of 46 positions shown, 18 images · IV contrast (APPLIED)
Comparison: None.

CLINICAL DATA: Nausea and vomiting

EXAM:
CT ABDOMEN AND PELVIS WITH CONTRAST
TECHNIQUE: Multidetector CT imaging of the abdomen and pelvis was performed
using the standard protocol following bolus administration of
intravenous contrast.
CONTRAST:  80mL OMNIPAQUE IOHEXOL 300 MG/ML  SOLN

[Series 2: routine abd/pel with · axial · 0.66mm/px · z∈[-1007,-652]mm · 13 of 79 slices shown, 15 images]
[im 4/79  soft-tissue]
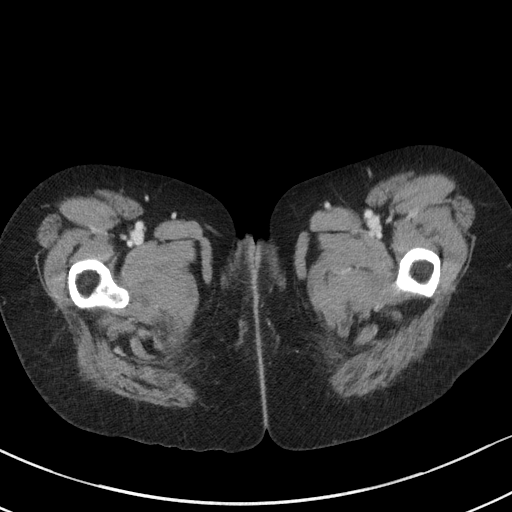
[im 4/79  bone]
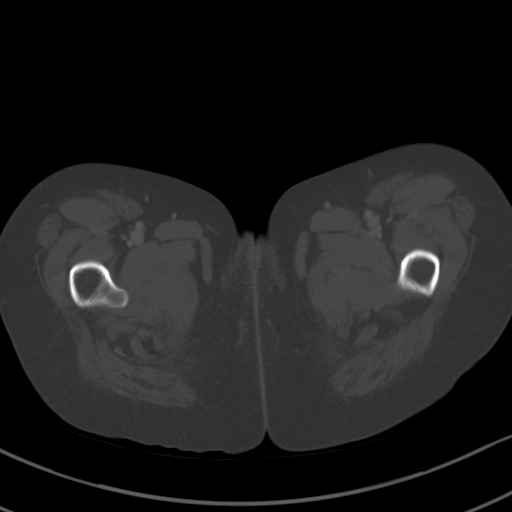
[im 10/79  soft-tissue]
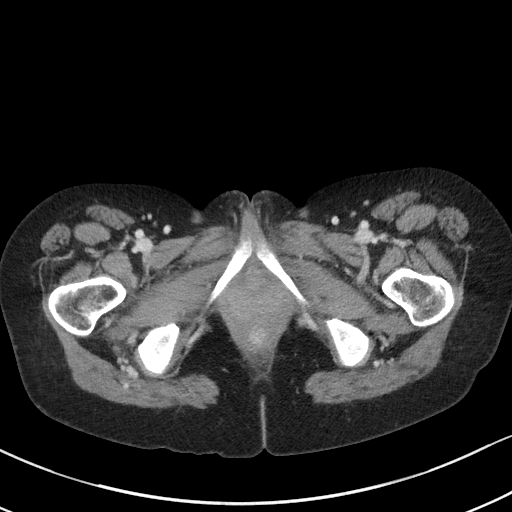
[im 16/79  soft-tissue]
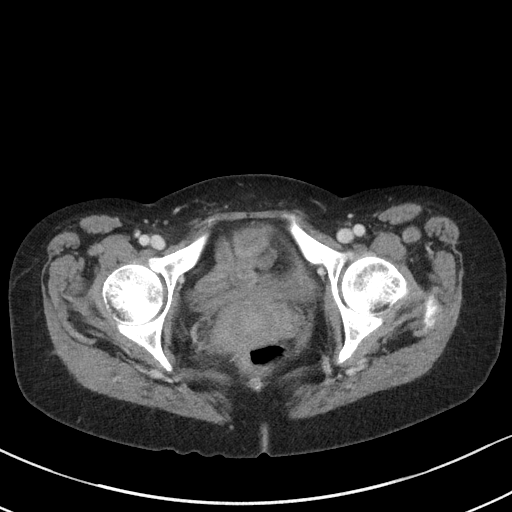
[im 22/79  soft-tissue]
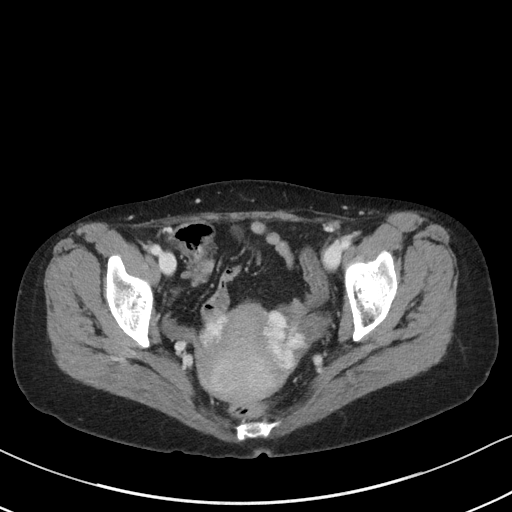
[im 29/79  soft-tissue]
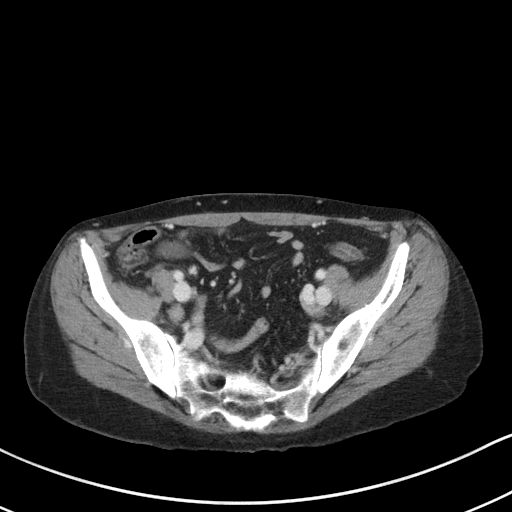
[im 35/79  soft-tissue]
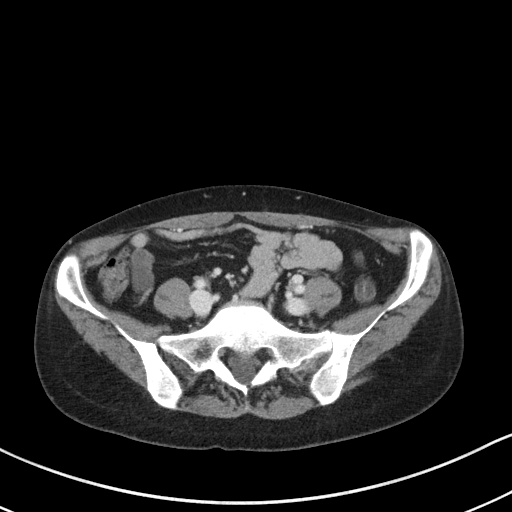
[im 41/79  soft-tissue]
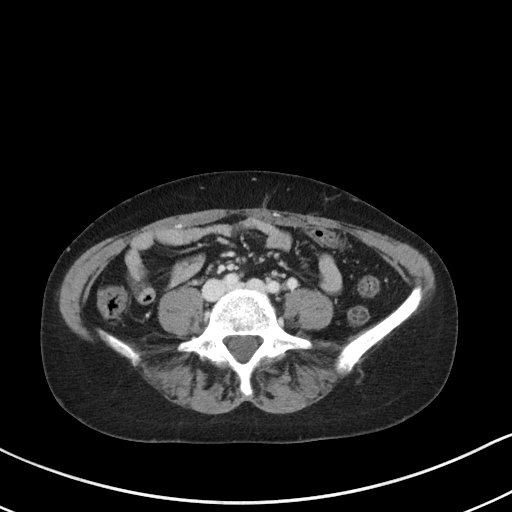
[im 44/79  soft-tissue]
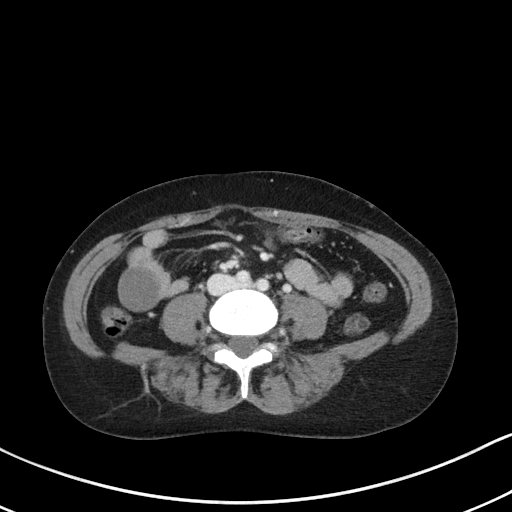
[im 50/79  soft-tissue]
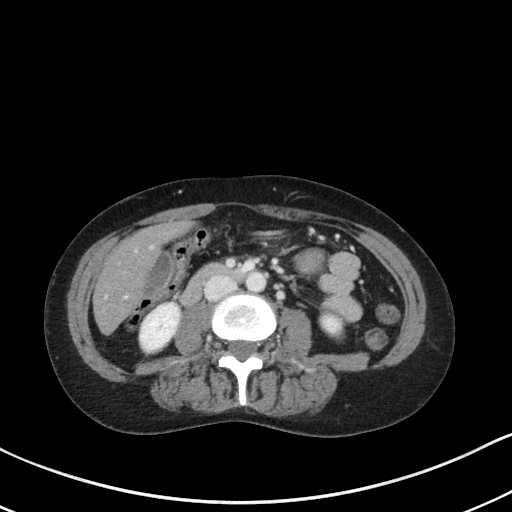
[im 50/79  bone]
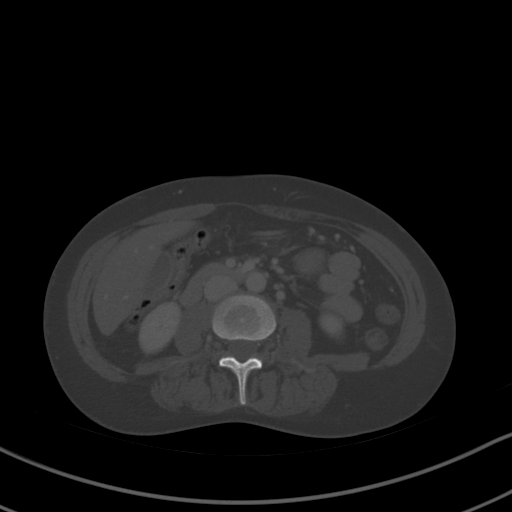
[im 57/79  soft-tissue]
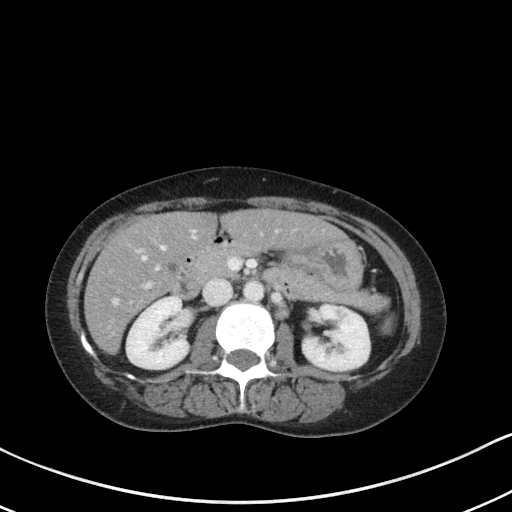
[im 63/79  soft-tissue]
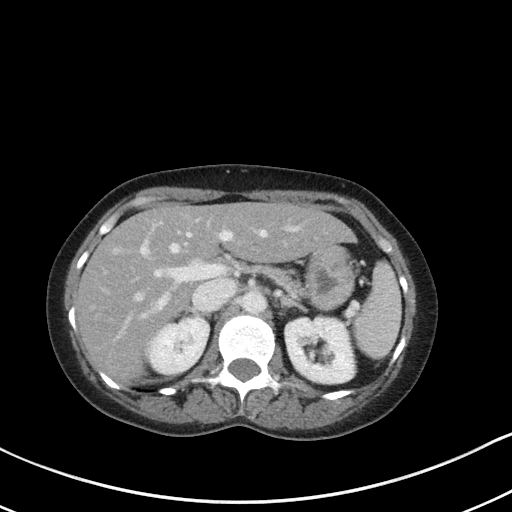
[im 69/79  soft-tissue]
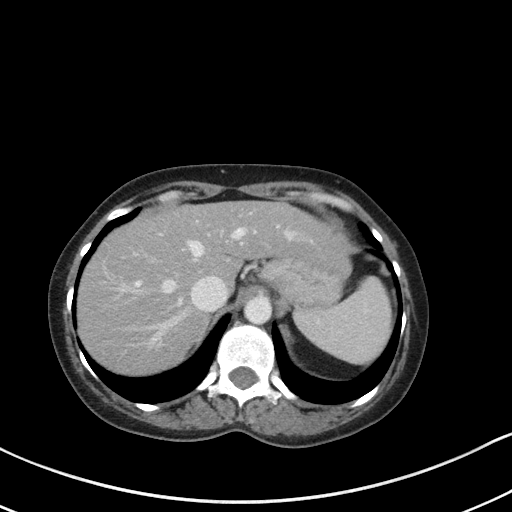
[im 75/79  soft-tissue]
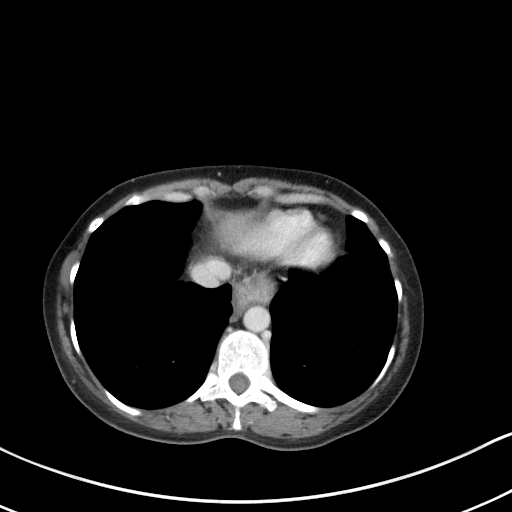

[Series 5: coronal st · coronal · 0.58mm/px · 3 of 65 slices shown]
[im 22/65  soft-tissue]
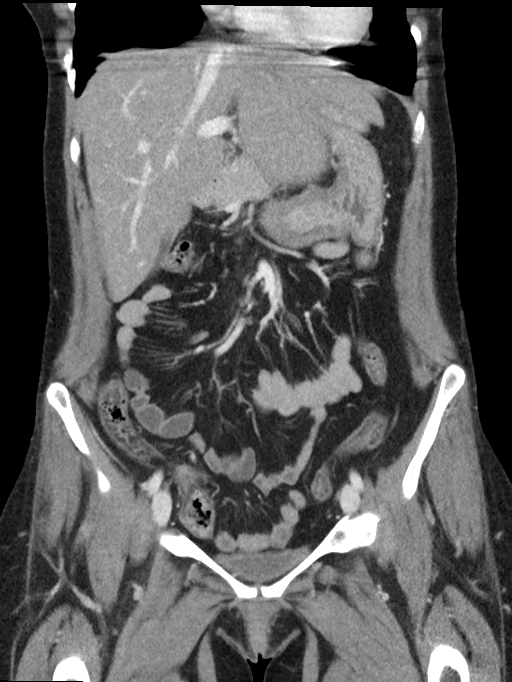
[im 29/65  soft-tissue]
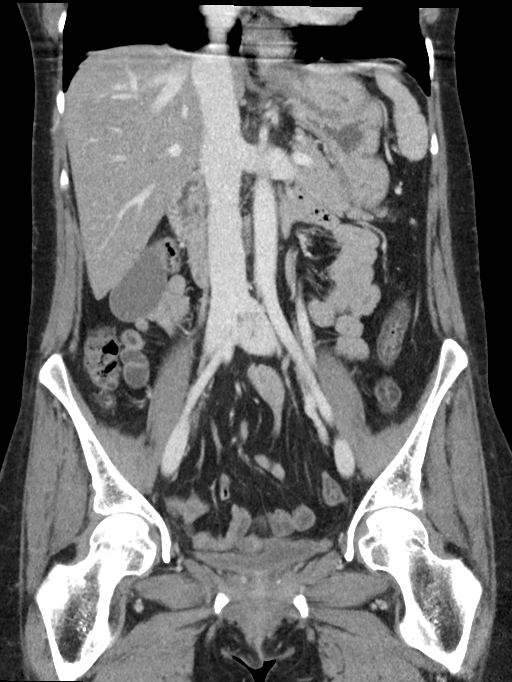
[im 36/65  soft-tissue]
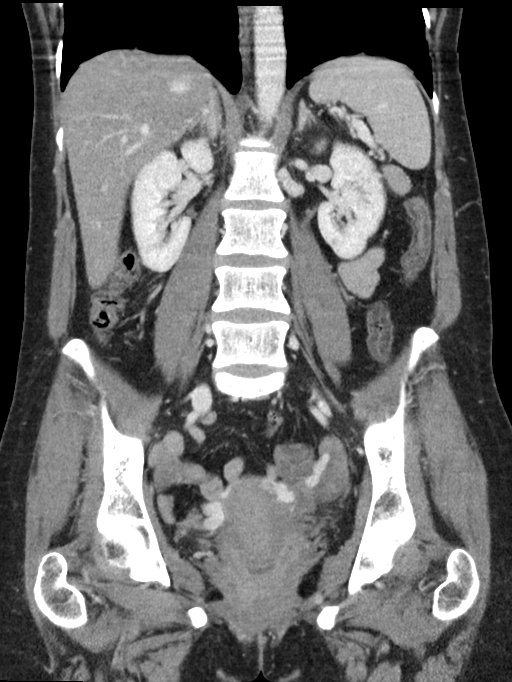

[16 of 46 positions shown; findings below may reference images not displayed]

FINDINGS: Lower chest: No acute abnormality.

Hepatobiliary: Mild fatty infiltration of the liver is noted. The
gallbladder appears within normal limits.

Pancreas: Unremarkable. No pancreatic ductal dilatation or
surrounding inflammatory changes.

Spleen: Normal in size without focal abnormality.

Adrenals/Urinary Tract: Adrenal glands are within normal limits.
Kidneys demonstrate a normal enhancement pattern bilaterally. No
renal calculi or obstructive changes are noted. The ureters are
within normal limits bilaterally. The bladder is decompressed.

Stomach/Bowel: Distal colon is decompressed. No obstructive changes
are seen. The appendix is within normal limits. Small bowel appears
within normal limits. Stomach shows a small sliding-type hiatal
hernia.

Vascular/Lymphatic: Abdominal aorta is within normal limits.
Prominent ovarian vein is noted on the left with prominent
periuterine veins. Similar prominent periuterine veins are noted on
the right draining into the internal iliac vein on the right. No
sizable lymphadenopathy is noted.

Reproductive: Uterus is within normal limits. Prominent periuterine
veins are seen. No adnexal abnormality is seen.

Other: No abdominal wall hernia or abnormality. No abdominopelvic
ascites.

Musculoskeletal: No acute or significant osseous findings.
IMPRESSION: No acute abnormality to correspond with the patient's given clinical
history.

Prominent periuterine veins bilaterally as described. These may
represent some changes of pelvic congestion.

No other focal abnormality is noted.

## 2023-06-20 ENCOUNTER — Inpatient Hospital Stay
Admission: EM | Admit: 2023-06-20 | Discharge: 2023-06-23 | DRG: 392 | Disposition: A | Discharge status: Home / Self Care | Payer: Medicaid Other | Attending: Student | Admitting: Student

## 2023-06-20 ENCOUNTER — Other Ambulatory Visit: Payer: Self-pay

## 2023-06-20 DIAGNOSIS — R112 Nausea with vomiting, unspecified: Principal | ICD-10-CM | POA: Diagnosis present

## 2023-06-20 DIAGNOSIS — Z720 Tobacco use: Secondary | ICD-10-CM | POA: Diagnosis present

## 2023-06-20 DIAGNOSIS — Z789 Other specified health status: Secondary | ICD-10-CM

## 2023-06-20 DIAGNOSIS — Z87898 Personal history of other specified conditions: Secondary | ICD-10-CM

## 2023-06-20 DIAGNOSIS — R9431 Abnormal electrocardiogram [ECG] [EKG]: Secondary | ICD-10-CM | POA: Diagnosis present

## 2023-06-20 DIAGNOSIS — D61818 Other pancytopenia: Secondary | ICD-10-CM | POA: Diagnosis present

## 2023-06-20 DIAGNOSIS — I5030 Unspecified diastolic (congestive) heart failure: Secondary | ICD-10-CM

## 2023-06-20 DIAGNOSIS — Y908 Blood alcohol level of 240 mg/100 ml or more: Secondary | ICD-10-CM | POA: Diagnosis present

## 2023-06-20 DIAGNOSIS — R1110 Vomiting, unspecified: Secondary | ICD-10-CM | POA: Diagnosis present

## 2023-06-20 DIAGNOSIS — Z79899 Other long term (current) drug therapy: Secondary | ICD-10-CM

## 2023-06-20 DIAGNOSIS — Z8269 Family history of other diseases of the musculoskeletal system and connective tissue: Secondary | ICD-10-CM

## 2023-06-20 DIAGNOSIS — F17210 Nicotine dependence, cigarettes, uncomplicated: Secondary | ICD-10-CM | POA: Diagnosis present

## 2023-06-20 DIAGNOSIS — E876 Hypokalemia: Secondary | ICD-10-CM | POA: Diagnosis present

## 2023-06-20 DIAGNOSIS — F1010 Alcohol abuse, uncomplicated: Secondary | ICD-10-CM | POA: Diagnosis present

## 2023-06-20 DIAGNOSIS — E872 Acidosis, unspecified: Secondary | ICD-10-CM | POA: Diagnosis present

## 2023-06-20 DIAGNOSIS — R7401 Elevation of levels of liver transaminase levels: Secondary | ICD-10-CM | POA: Insufficient documentation

## 2023-06-20 DIAGNOSIS — Z888 Allergy status to other drugs, medicaments and biological substances status: Secondary | ICD-10-CM

## 2023-06-20 DIAGNOSIS — I428 Other cardiomyopathies: Secondary | ICD-10-CM | POA: Diagnosis present

## 2023-06-20 DIAGNOSIS — K2920 Alcoholic gastritis without bleeding: Principal | ICD-10-CM | POA: Diagnosis present

## 2023-06-20 DIAGNOSIS — E16A1 Hypoglycemia level 1: Secondary | ICD-10-CM | POA: Diagnosis present

## 2023-06-20 LAB — CBC
HCT: 35.2 % — ABNORMAL LOW (ref 36.0–46.0)
Hemoglobin: 12.2 g/dL (ref 12.0–15.0)
MCH: 28 pg (ref 26.0–34.0)
MCHC: 34.7 g/dL (ref 30.0–36.0)
MCV: 80.7 fL (ref 80.0–100.0)
Platelets: 160 10*3/uL (ref 150–400)
RBC: 4.36 MIL/uL (ref 3.87–5.11)
RDW: 15.9 % — ABNORMAL HIGH (ref 11.5–15.5)
WBC: 2 10*3/uL — ABNORMAL LOW (ref 4.0–10.5)
nRBC: 0 % (ref 0.0–0.2)

## 2023-06-20 LAB — URINALYSIS, ROUTINE W REFLEX MICROSCOPIC
Glucose, UA: NEGATIVE mg/dL
Leukocytes,Ua: NEGATIVE
Nitrite: NEGATIVE
Protein, ur: >300 mg/dL — AB
Specific Gravity, Urine: >1.03 — ABNORMAL HIGH (ref 1.005–1.030)
pH: 5.5 (ref 5.0–8.0)

## 2023-06-20 LAB — COMPREHENSIVE METABOLIC PANEL
ALT: 62 U/L — ABNORMAL HIGH (ref 0–44)
AST: 184 U/L — ABNORMAL HIGH (ref 15–41)
Albumin: 4.4 g/dL (ref 3.5–5.0)
Alkaline Phosphatase: 87 U/L (ref 38–126)
Anion gap: 16 — ABNORMAL HIGH (ref 5–15)
BUN: 5 mg/dL — ABNORMAL LOW (ref 6–20)
CO2: 20 mmol/L — ABNORMAL LOW (ref 22–32)
Calcium: 8.7 mg/dL — ABNORMAL LOW (ref 8.9–10.3)
Chloride: 105 mmol/L (ref 98–111)
Creatinine, Ser: 0.62 mg/dL (ref 0.44–1.00)
GFR, Estimated: >60 mL/min (ref >60)
Glucose, Bld: 80 mg/dL (ref 70–99)
Potassium: 3.4 mmol/L — ABNORMAL LOW (ref 3.5–5.1)
Sodium: 141 mmol/L (ref 135–145)
Total Bilirubin: 0.6 mg/dL (ref <1.2)
Total Protein: 8 g/dL (ref 6.5–8.1)

## 2023-06-20 LAB — ETHANOL: Alcohol, Ethyl (B): 282 mg/dL — ABNORMAL HIGH (ref <10)

## 2023-06-20 LAB — URINE DRUG SCREEN, QUALITATIVE (ARMC ONLY)
Amphetamines, Ur Screen: NOT DETECTED
Barbiturates, Ur Screen: NOT DETECTED
Benzodiazepine, Ur Scrn: NOT DETECTED
Cannabinoid 50 Ng, Ur ~~LOC~~: NOT DETECTED
Cocaine Metabolite,Ur ~~LOC~~: NOT DETECTED
MDMA (Ecstasy)Ur Screen: NOT DETECTED
Methadone Scn, Ur: NOT DETECTED
Opiate, Ur Screen: NOT DETECTED
Phencyclidine (PCP) Ur S: NOT DETECTED
Tricyclic, Ur Screen: NOT DETECTED

## 2023-06-20 LAB — URINALYSIS, MICROSCOPIC (REFLEX)

## 2023-06-20 LAB — POC URINE PREG, ED: Preg Test, Ur: NEGATIVE

## 2023-06-20 LAB — LIPASE, BLOOD: Lipase: 26 U/L (ref 11–51)

## 2023-06-20 MED ORDER — LORAZEPAM 1 MG PO TABS
1.0000 mg | ORAL_TABLET | ORAL | Status: AC | PRN
  Administered 2023-06-21 (×2): 1 mg via ORAL
  Filled 2023-06-20 (×2): qty 1

## 2023-06-20 MED ORDER — PROMETHAZINE HCL 25 MG/ML IJ SOLN
12.5000 mg | Freq: Four times a day (QID) | INTRAMUSCULAR | Status: DC | PRN
  Administered 2023-06-20: 12.5 mg via INTRAMUSCULAR
  Filled 2023-06-20 (×3): qty 1

## 2023-06-20 MED ORDER — ENOXAPARIN SODIUM 40 MG/0.4ML IJ SOSY
40.0000 mg | PREFILLED_SYRINGE | INTRAMUSCULAR | Status: DC
  Administered 2023-06-20 – 2023-06-22 (×3): 40 mg via SUBCUTANEOUS
  Filled 2023-06-20 (×3): qty 0.4

## 2023-06-20 MED ORDER — FOLIC ACID 1 MG PO TABS
1.0000 mg | ORAL_TABLET | Freq: Every day | ORAL | Status: DC
  Administered 2023-06-20: 1 mg via ORAL
  Filled 2023-06-20: qty 1

## 2023-06-20 MED ORDER — METOCLOPRAMIDE HCL 5 MG/ML IJ SOLN
10.0000 mg | Freq: Once | INTRAMUSCULAR | Status: AC
Start: 2023-06-20 — End: 2023-06-20
  Administered 2023-06-20: 10 mg via INTRAVENOUS
  Filled 2023-06-20: qty 2

## 2023-06-20 MED ORDER — LACTATED RINGERS IV SOLN: INTRAVENOUS | Status: DC

## 2023-06-20 MED ORDER — THIAMINE HCL 100 MG/ML IJ SOLN
100.0000 mg | Freq: Every day | INTRAMUSCULAR | Status: DC
  Administered 2023-06-20 – 2023-06-22 (×3): 100 mg via INTRAVENOUS
  Filled 2023-06-20 (×3): qty 2

## 2023-06-20 MED ORDER — ADULT MULTIVITAMIN W/MINERALS CH
1.0000 | ORAL_TABLET | Freq: Every day | ORAL | Status: DC
  Administered 2023-06-20 – 2023-06-23 (×4): 1 via ORAL
  Filled 2023-06-20 (×4): qty 1

## 2023-06-20 MED ORDER — LORAZEPAM 2 MG/ML IJ SOLN
0.5000 mg | Freq: Once | INTRAMUSCULAR | Status: AC
  Administered 2023-06-20: 0.5 mg via INTRAMUSCULAR
  Filled 2023-06-20: qty 1

## 2023-06-20 MED ORDER — SODIUM CHLORIDE 0.9 % IV SOLN
25.0000 mg | Freq: Once | INTRAVENOUS | Status: AC
  Administered 2023-06-20: 25 mg via INTRAVENOUS
  Filled 2023-06-20: qty 25

## 2023-06-20 MED ORDER — NICOTINE 7 MG/24HR TD PT24
7.0000 mg | MEDICATED_PATCH | Freq: Every day | TRANSDERMAL | Status: DC
  Administered 2023-06-20 – 2023-06-23 (×4): 7 mg via TRANSDERMAL
  Filled 2023-06-20 (×4): qty 1

## 2023-06-20 MED ORDER — THIAMINE HCL 100 MG/ML IJ SOLN
100.0000 mg | Freq: Every day | INTRAMUSCULAR | Status: DC
  Filled 2023-06-20: qty 2

## 2023-06-20 MED ORDER — PROMETHAZINE HCL 12.5 MG PO TABS: 12.5000 mg | ORAL_TABLET | Freq: Four times a day (QID) | ORAL | 0 refills | Status: AC | PRN

## 2023-06-20 MED ORDER — THIAMINE MONONITRATE 100 MG PO TABS
100.0000 mg | ORAL_TABLET | Freq: Every day | ORAL | Status: DC
  Administered 2023-06-20: 100 mg via ORAL
  Filled 2023-06-20 (×2): qty 1

## 2023-06-20 MED ORDER — HYALURONIDASE HUMAN 150 UNIT/ML IJ SOLN
150.0000 [IU] | Freq: Once | INTRAMUSCULAR | Status: AC
  Administered 2023-06-20: 150 [IU] via SUBCUTANEOUS
  Filled 2023-06-20: qty 1

## 2023-06-20 MED ORDER — FOLIC ACID 5 MG/ML IJ SOLN
1.0000 mg | Freq: Every day | INTRAMUSCULAR | Status: DC
  Administered 2023-06-20 – 2023-06-22 (×3): 1 mg via INTRAVENOUS
  Filled 2023-06-20 (×3): qty 0.2

## 2023-06-20 MED ORDER — PROCHLORPERAZINE EDISYLATE 10 MG/2ML IJ SOLN
10.0000 mg | Freq: Once | INTRAMUSCULAR | Status: AC
  Administered 2023-06-20: 10 mg via INTRAMUSCULAR
  Filled 2023-06-20: qty 2

## 2023-06-20 MED ORDER — PANTOPRAZOLE SODIUM 40 MG IV SOLR
40.0000 mg | Freq: Two times a day (BID) | INTRAVENOUS | Status: DC
  Administered 2023-06-20 – 2023-06-23 (×7): 40 mg via INTRAVENOUS
  Filled 2023-06-20 (×7): qty 10

## 2023-06-20 MED ORDER — SODIUM CHLORIDE 0.9 % IV BOLUS
1000.0000 mL | Freq: Once | INTRAVENOUS | Status: AC
  Administered 2023-06-20: 1000 mL via INTRAVENOUS

## 2023-06-20 MED ORDER — LORAZEPAM 2 MG/ML IJ SOLN
1.0000 mg | INTRAMUSCULAR | Status: AC | PRN
  Administered 2023-06-20: 1 mg via INTRAVENOUS
  Administered 2023-06-20: 2 mg via INTRAVENOUS
  Filled 2023-06-20 (×3): qty 1

## 2023-06-20 NOTE — ED Provider Notes (Signed)
[facility] Provider Note    Event Date/Time   First MD Initiated Contact with Patient 06/20/23 0703     (approximate)   History   Emesis   HPI  Yvette [name] is a 42 y.o. female   Past medical history of alcohol use, intractable nausea and vomiting, who presents to the emergency department with nausea and vomiting.  Episode started this morning.  She drank alcohol last night.  She denies significant abdominal pain.  She denies diarrhea, urinary symptoms.    External Medical Documents Reviewed: Endoscopy and colonoscopy performed on May 18, 2023 which were largely normal      Physical Exam   Triage Vital Signs: ED Triage Vitals  Encounter Vitals Group     BP 06/20/23 0501 (!) 116/94     Systolic BP Percentile --      Diastolic BP Percentile --      Pulse Rate 06/20/23 0501 (!) 112     Resp 06/20/23 0501 18     Temp 06/20/23 0501 98.9 F (37.2 C)     Temp src --      SpO2 06/20/23 0501 98 %     Weight 06/20/23 0506 115 lb (52.2 kg)     Height 06/20/23 0506 5\' 1"  (1.549 m)     Head Circumference --      Peak Flow --      Pain Score 06/20/23 0506 0     Pain Loc --      Pain Education --      Exclude from Growth Chart --     Most recent vital signs: Vitals:   06/20/23 1030 06/20/23 1338  BP: (!) 145/95   Pulse: 85   Resp:    Temp:  98.3 F (36.8 C)  SpO2:      General: Awake, no distress.  CV:  Good peripheral perfusion.  Resp:  Normal effort.  Abd:  No distention.  Other:  Awake alert oriented and comfortable appearing resting comfortably in the stretcher, with a soft benign abdominal exam no rigidity or guarding to deep palpation all quadrants.  Mildly tachycardic.   ED Results / Procedures / Treatments   Labs (all labs ordered are listed, but only abnormal results are displayed) Labs Reviewed  CBC - Abnormal; Notable for the following components:      Result Value   WBC 2.0 (*)    HCT 35.2 (*)    RDW 15.9 (*)     All other components within normal limits  COMPREHENSIVE METABOLIC PANEL - Abnormal; Notable for the following components:   Potassium 3.4 (*)    CO2 20 (*)    BUN 5 (*)    Calcium 8.7 (*)    AST 184 (*)    ALT 62 (*)    Anion gap 16 (*)    All other components within normal limits  ETHANOL - Abnormal; Notable for the following components:   Alcohol, Ethyl (B) 282 (*)    All other components within normal limits  URINALYSIS, ROUTINE W REFLEX MICROSCOPIC - Abnormal; Notable for the following components:   Specific Gravity, Urine >1.030 (*)    Hgb urine dipstick MODERATE (*)    Bilirubin Urine SMALL (*)    Ketones, ur TRACE (*)    Protein, ur >300 (*)    All other components within normal limits  URINALYSIS, MICROSCOPIC (REFLEX) - Abnormal; Notable for the following components:   Bacteria, UA FEW (*)    All  other components within normal limits  LIPASE, BLOOD  URINE DRUG SCREEN, QUALITATIVE ([facility] ONLY)  POC URINE PREG, ED     I ordered and reviewed the above labs they are notable for she has alcohol level of 280, potassium 3.4, and AST ALT elevated in the 2:1 ratio  EKG  ED ECG REPORT I, Pilar Jarvis, the attending physician, personally viewed and interpreted this ECG.   Date: 06/20/2023  EKG Time: 0517  Rate: 110  Rhythm: Sinus tachycardia  Axis: Normal  Intervals: Normal QTc  ST&T Change: No acute ischemic changes     PROCEDURES:  Critical Care performed: No  Procedures   MEDICATIONS ORDERED IN ED: Medications  LORazepam (ATIVAN) tablet 1-4 mg (has no administration in time range)    Or  LORazepam (ATIVAN) injection 1-4 mg (has no administration in time range)  multivitamin with minerals tablet 1 tablet (1 tablet Oral Given 06/20/23 1227)  enoxaparin (LOVENOX) injection 40 mg (has no administration in time range)  lactated ringers infusion (has no administration in time range)  thiamine (VITAMIN B1) injection 100 mg (has no administration in time range)   folic acid injection 1 mg (has no administration in time range)  nicotine (NICODERM CQ - dosed in mg/24 hr) patch 7 mg (has no administration in time range)  pantoprazole (PROTONIX) injection 40 mg (has no administration in time range)  hyaluronidase Human (HYLENEX) injection 150 Units (has no administration in time range)  metoCLOPramide (REGLAN) injection 10 mg (10 mg Intravenous Given 06/20/23 0918)  sodium chloride 0.9 % bolus 1,000 mL (0 mLs Intravenous Stopped 06/20/23 1244)  promethazine (PHENERGAN) 25 mg in sodium chloride 0.9 % 50 mL IVPB (0 mg Intravenous Stopped 06/20/23 1212)  prochlorperazine (COMPAZINE) injection 10 mg (10 mg Intramuscular Given 06/20/23 1339)  LORazepam (ATIVAN) injection 0.5 mg (0.5 mg Intramuscular Given 06/20/23 1340)    External physician / consultants:  I spoke with hospital medicine for admission regarding care plan for this patient.   IMPRESSION / MDM / ASSESSMENT AND PLAN / ED COURSE  I reviewed the triage vital signs and the nursing notes.                                Patient's presentation is most consistent with acute presentation with potential threat to life or bodily function.  Differential diagnosis includes, but is not limited to, nausea/vomiting due to alcohol use, cyclic vomiting syndrome, electrolyte disturbance or dehydration, pregnancy related complications, intra-abdominal infection or obstruction less likely   The patient is on the cardiac monitor to evaluate for evidence of arrhythmia and/or significant heart rate changes.  MDM:    This is a patient with a reoccurrence of her chronic cyclical vomiting this is in the setting of alcohol use she does not appear clinically intoxicated at this time nor in acute withdrawal however.  She has a soft benign abdominal exam rules against surgical abdominal pathologies at this time.  Will symptomatically manage as she already starting to feel better without treatment with some rest in the  emergency department and was willing to try p.o. intake however vomited after ginger ale so we will establish IV access, give a liter saline bolus as well as IV antiemetic and reassess.   After multiple IV antiemetics patient continues to have intractable vomiting so she was admitted.       FINAL CLINICAL IMPRESSION(S) / ED DIAGNOSES   Final diagnoses:  Nausea and  vomiting, unspecified vomiting type  Alcohol use     Rx / DC Orders   ED Discharge Orders          Ordered    promethazine (PHENERGAN) 12.5 MG tablet  Every 6 hours PRN        06/20/23 0845             Note:  This document was prepared using Dragon voice recognition software and may include unintentional dictation errors.    Pilar Jarvis, MD 06/20/23 1356

## 2023-06-20 NOTE — ED Notes (Signed)
Pt awake and vomiting.

## 2023-06-20 NOTE — ED Notes (Signed)
Patient vomiting at this time. Patient vomiting PO tablets given by this RN.

## 2023-06-20 NOTE — ED Notes (Signed)
This RN talked to IV team after IV establishment. Patient's IV was placed below extravasation site. This RN told IV team that this IV will not be able to be used due to extravasation in the same arm. IV team nurse states she will go back into patient's room and look for a site on the other arm. New IV team consult placed now.

## 2023-06-20 NOTE — H&P (Addendum)
History and Physical    Patient: Yvette [name] DOB: [birth year] DOA: 06/20/2023 DOS: the patient was seen and examined on 06/20/2023 PCP: Jodi Marble, NP  Patient coming from: Home  Chief Complaint:  Chief Complaint  Patient presents with   Emesis   HPI: Yvette [name] is a 42 y.o. female with medical history significant of alcohol abuse, asthma, , nonischemic cardiomyopathy with recovered EF secondary to VF arrest, iron deficiency anemia, tobacco use disorder, Kawasaki disease , history of prolonged QT, history of alcohol associated nausea vomiting and gastritis presenting with intractable nausea and vomiting.  Patient reports recurrence nausea vomiting the past 4 to 5 hours.  Patient states she had alcohol binge drinking roughly 4 beers.  Has had intractable nausea vomiting since this point.  No reported black or bloody emesis.  No chest pain or shortness of breath.  No abdominal pain.  No focal hemiparesis or confusion.  This has been a recurring issue associated with alcohol use. Presented to the ER afebrile, heart rate 80s to 100s, BP stable.  Satting well on room air.  White count 2, hemoglobin 12.2, platelets 160, creatinine 0.62, AST 184, ALT 62, lipase 26, alcohol level 282, urinalysis not indicative of infection, urine drug screen negative. Review of Systems: As mentioned in the history of present illness. All other systems reviewed and are negative. Past Medical History:  Diagnosis Date   Asthma    Cardiac arrest (HCC)    ETOH abuse    H/O Kawasaki's disease    as 63 month old child and at 8 years   History of anemia    History of blood transfusion 1984   Hypokalemia    Hypomagnesemia    a. in the setting of n/v->gastritis/etoh.   Leukopenia    Neutropenia (HCC)    NICM (nonischemic cardiomyopathy) (HCC)    a. 06/2021 Echo: EF 50-55%, [name]; b. 09/2021 ETT: Ex time 6:19, [name], [name]; c. 03/2022 Echo: EF 35-40%, sev anteroseptal/ant HK.  [name]; d. 08/2022 Echo: EF 60-65%, no rwma, [name].   NSVT (nonsustained ventricular tachycardia) (HCC)    a. Noted during admission 06/2021 assoc w/ palpitations and presyncope in the setting of profound QT prolongation/gastritis.   Tobacco abuse    Transaminitis    Past Surgical History:  Procedure Laterality Date   ESOPHAGOGASTRODUODENOSCOPY N/A 10/29/2020   Procedure: ESOPHAGOGASTRODUODENOSCOPY (EGD);  Surgeon: Wyline Mood, MD;  Location: [facility] ENDOSCOPY;  Service: Gastroenterology;  [name];   ESOPHAGOGASTRODUODENOSCOPY (EGD) WITH PROPOFOL N/A 03/14/2020   Procedure: ESOPHAGOGASTRODUODENOSCOPY (EGD) WITH PROPOFOL;  Surgeon: Pasty Spillers, MD;  [name];  Service: Endoscopy;  [name];   ESOPHAGOGASTRODUODENOSCOPY (EGD) WITH PROPOFOL N/A 02/25/2021   Procedure: ESOPHAGOGASTRODUODENOSCOPY (EGD) WITH PROPOFOL;  Surgeon: Pasty Spillers, MD;  [name];  Service: Endoscopy;  [name];   ESOPHAGOGASTRODUODENOSCOPY (EGD) WITH PROPOFOL N/A 06/25/2022   Procedure: ESOPHAGOGASTRODUODENOSCOPY (EGD) WITH PROPOFOL;  Surgeon: Jaynie Collins, DO;  Location: [facility] ENDOSCOPY;  Service: Gastroenterology;  [name];   Social History:  reports that she has been smoking cigarettes. She has a 2.5 pack-year smoking history. She has never used smokeless tobacco. She reports current alcohol use of about 61.0 standard drinks of alcohol per week. She reports that she does not use drugs.  Allergies  Allergen Reactions   Inapsine [Droperidol] Anaphylaxis   Zofran [Ondansetron] Other (See Comments)    Prolonged QT    Family History  Problem  Relation Age of Onset   Lupus Mother    Prostate cancer Father    Cancer Paternal Aunt        Breast    Cancer Paternal Aunt        Breast     Prior to Admission medications   Medication Sig Start Date End Date Taking? Authorizing Provider  promethazine (PHENERGAN) 12.5 MG tablet Take 1  tablet (12.5 mg total) by mouth every 6 (six) hours as needed for nausea or vomiting. 06/20/23  Yes Pilar Jarvis, MD  folic acid (FOLVITE) 1 MG tablet Take 1 tablet (1 mg total) by mouth daily. 01/26/23   Earna Coder, MD  medroxyPROGESTERone Acetate (DEPO-PROVERA IM) Inject 150 mg into the muscle every 3 (three) months.    [provider]  naltrexone (DEPADE) 50 MG tablet Take 50 mg by mouth daily.    [provider]  nicotine (NICODERM CQ - DOSED IN MG/24 HR) 7 mg/24hr patch Place 1 patch (7 mg total) onto the skin daily. 05/09/23 05/08/24  Pilar Jarvis, MD  nicotine polacrilex (NICOTINE MINI) 4 MG lozenge Take 1 lozenge (4 mg total) by mouth as needed. 05/09/23   Pilar Jarvis, MD  pantoprazole (PROTONIX) 40 MG tablet Take 1 tablet (40 mg total) by mouth daily. 05/11/23   Marrion Coy, MD  sucralfate (CARAFATE) 1 g tablet Take 1 tablet (1 g total) by mouth 4 (four) times daily -  with meals and at bedtime for 7 days. 05/11/23 05/18/23  Marrion Coy, MD  thiamine (VITAMIN B-1) 100 MG tablet Take 1 tablet (100 mg total) by mouth daily. 05/18/23   Arnetha Courser, MD    Physical Exam: Vitals:   06/20/23 0506 06/20/23 0910 06/20/23 1000 06/20/23 1030  BP:  (!) 127/96 133/86 (!) 145/95  Pulse:  (!) 113 (!) 106 85  Resp:  16    Temp:  98.8 F (37.1 C)    TempSrc:  Oral    SpO2:  100%    Weight: 52.2 kg     Height: 5\' 1"  (1.549 m)      Physical Exam Constitutional:      Appearance: She is normal weight.  HENT:     Head: Normocephalic and atraumatic.     Nose: Nose normal.     Mouth/Throat:     Mouth: Mucous membranes are dry.  Eyes:     Pupils: Pupils are equal, round, and reactive to light.  Cardiovascular:     Rate and Rhythm: Normal rate and regular rhythm.  Pulmonary:     Effort: Pulmonary effort is normal.  Abdominal:     General: Bowel sounds are normal.  Musculoskeletal:        General: Normal range of motion.  Skin:    General: Skin is dry.   Neurological:     General: No focal deficit present.  Psychiatric:        Mood and Affect: Mood normal.     Data Reviewed:  There are no new results to review at this time.  Lab Results  Component Value Date   WBC 2.0 (L) 06/20/2023   HGB 12.2 06/20/2023   HCT 35.2 (L) 06/20/2023   MCV 80.7 06/20/2023   PLT 160 06/20/2023   Last metabolic panel Lab Results  Component Value Date   GLUCOSE 80 06/20/2023   NA 141 06/20/2023   K 3.4 (L) 06/20/2023   CL 105 06/20/2023   CO2 20 (L) 06/20/2023   BUN 5 (L) 06/20/2023  CREATININE 0.62 06/20/2023   GFRNONAA >60 06/20/2023   CALCIUM 8.7 (L) 06/20/2023   PHOS 3.4 05/17/2023   PROT 8.0 06/20/2023   ALBUMIN 4.4 06/20/2023   LABGLOB 3.1 02/05/2021   AGRATIO 1.6 02/05/2021   BILITOT 0.6 06/20/2023   ALKPHOS 87 06/20/2023   AST 184 (H) 06/20/2023   ALT 62 (H) 06/20/2023   ANIONGAP 16 (H) 06/20/2023     Assessment and Plan: Intractable vomiting Intractable nausea vomiting in setting of alcohol binge Recurring issue  As needed antiemetics IV fluid hydration Discussed alcohol cessation Monitor  Transaminitis AST 184, ALT 62 Likely secondary to alcohol abuse Monitor  Alcohol abuse Alcohol level 280 today with noted 4 large cans of beer intake today Discussed cessation at length Withdrawal protocol Monitor  History of Prolonged QT interval QTc in the 450s today Monitor  Tobacco abuse 1/4 pack/day smoker Discussed cessation Nicotine patch  Alcoholic gastritis IV PPI      Advance Care Planning:   Code Status: Full Code   Consults: None   Family Communication: No family at the bedside   Severity of Illness: The appropriate patient status for this patient is OBSERVATION. Observation status is judged to be reasonable and necessary in order to provide the required intensity of service to ensure the patient's safety. The patient's presenting symptoms, physical exam findings, and initial radiographic and  laboratory data in the context of their medical condition is felt to place them at decreased risk for further clinical deterioration. Furthermore, it is anticipated that the patient will be medically stable for discharge from the hospital within 2 midnights of admission.   Author: Floydene Flock, MD 06/20/2023 1:37 PM  For on call review [email or web address].

## 2023-06-20 NOTE — Assessment & Plan Note (Signed)
Alcohol level 280 today with noted 4 large cans of beer intake today Discussed cessation at length Withdrawal protocol Monitor

## 2023-06-20 NOTE — ED Notes (Signed)
Patient's IV infiltrated. Swelling noted to patient's R forearm. Pharmacy notified since patient received phenergan and NS bolus.

## 2023-06-20 NOTE — Assessment & Plan Note (Signed)
IV PPI. 

## 2023-06-20 NOTE — ED Notes (Signed)
Patient PO challenge per MD Modesto Charon. Patient given graham crackers and ginger ale. Patient unable to tolerate ginger ale at this time. Patient vomiting 5 minutes after drinking fluids. Patient did not eat graham crackers.

## 2023-06-20 NOTE — ED Triage Notes (Signed)
Pt presents to ER with c/o n/v that has been ongoing for last 4-5 hours.  Pt reports hx of gastroenteritis, and states she has had to be admitted for her gastroenteritis previously.  Denies any abd pain, or urinary sx.  Pt is otherwise A&O x4 and in NAD at this time.

## 2023-06-20 NOTE — ED Notes (Signed)
Pt sleeping, in no apparent distress. Respirations even, unlabored.

## 2023-06-20 NOTE — Assessment & Plan Note (Addendum)
Intractable nausea vomiting in setting of alcohol binge Recurring issue  As needed antiemetics IV fluid hydration Discussed alcohol cessation Monitor

## 2023-06-20 NOTE — Assessment & Plan Note (Signed)
 1/4 pack/day smoker Discussed cessation Nicotine patch

## 2023-06-20 NOTE — Assessment & Plan Note (Signed)
AST 184, ALT 62 Likely secondary to alcohol abuse Monitor

## 2023-06-20 NOTE — Assessment & Plan Note (Signed)
QTc in the 450s today Monitor

## 2023-06-21 DIAGNOSIS — D61818 Other pancytopenia: Secondary | ICD-10-CM | POA: Diagnosis present

## 2023-06-21 DIAGNOSIS — Y908 Blood alcohol level of 240 mg/100 ml or more: Secondary | ICD-10-CM | POA: Diagnosis present

## 2023-06-21 DIAGNOSIS — I428 Other cardiomyopathies: Secondary | ICD-10-CM | POA: Diagnosis present

## 2023-06-21 DIAGNOSIS — Z8269 Family history of other diseases of the musculoskeletal system and connective tissue: Secondary | ICD-10-CM | POA: Diagnosis not present

## 2023-06-21 DIAGNOSIS — F17210 Nicotine dependence, cigarettes, uncomplicated: Secondary | ICD-10-CM | POA: Diagnosis present

## 2023-06-21 DIAGNOSIS — E872 Acidosis, unspecified: Secondary | ICD-10-CM | POA: Diagnosis present

## 2023-06-21 DIAGNOSIS — F1010 Alcohol abuse, uncomplicated: Secondary | ICD-10-CM | POA: Diagnosis present

## 2023-06-21 DIAGNOSIS — E876 Hypokalemia: Secondary | ICD-10-CM | POA: Diagnosis present

## 2023-06-21 DIAGNOSIS — Z789 Other specified health status: Secondary | ICD-10-CM | POA: Diagnosis present

## 2023-06-21 DIAGNOSIS — Z79899 Other long term (current) drug therapy: Secondary | ICD-10-CM | POA: Diagnosis not present

## 2023-06-21 DIAGNOSIS — Z888 Allergy status to other drugs, medicaments and biological substances status: Secondary | ICD-10-CM | POA: Diagnosis not present

## 2023-06-21 DIAGNOSIS — R7401 Elevation of levels of liver transaminase levels: Secondary | ICD-10-CM | POA: Diagnosis present

## 2023-06-21 DIAGNOSIS — K2920 Alcoholic gastritis without bleeding: Secondary | ICD-10-CM | POA: Diagnosis present

## 2023-06-21 DIAGNOSIS — E16A1 Hypoglycemia level 1: Secondary | ICD-10-CM | POA: Diagnosis present

## 2023-06-21 DIAGNOSIS — R112 Nausea with vomiting, unspecified: Secondary | ICD-10-CM | POA: Diagnosis not present

## 2023-06-21 LAB — PHOSPHORUS: Phosphorus: 3.5 mg/dL (ref 2.5–4.6)

## 2023-06-21 LAB — COMPREHENSIVE METABOLIC PANEL
ALT: 44 U/L (ref 0–44)
AST: 78 U/L — ABNORMAL HIGH (ref 15–41)
Albumin: 3.5 g/dL (ref 3.5–5.0)
Alkaline Phosphatase: 63 U/L (ref 38–126)
Anion gap: 13 (ref 5–15)
BUN: 7 mg/dL (ref 6–20)
CO2: 16 mmol/L — ABNORMAL LOW (ref 22–32)
Calcium: 8.9 mg/dL (ref 8.9–10.3)
Chloride: 108 mmol/L (ref 98–111)
Creatinine, Ser: 0.84 mg/dL (ref 0.44–1.00)
GFR, Estimated: >60 mL/min (ref >60)
Glucose, Bld: 66 mg/dL — ABNORMAL LOW (ref 70–99)
Potassium: 4.2 mmol/L (ref 3.5–5.1)
Sodium: 137 mmol/L (ref 135–145)
Total Bilirubin: 1.3 mg/dL — ABNORMAL HIGH (ref <1.2)
Total Protein: 6.7 g/dL (ref 6.5–8.1)

## 2023-06-21 LAB — CBG MONITORING, ED
Glucose-Capillary: 61 mg/dL — ABNORMAL LOW (ref 70–99)
Glucose-Capillary: 92 mg/dL (ref 70–99)

## 2023-06-21 LAB — CBC
HCT: 28.8 % — ABNORMAL LOW (ref 36.0–46.0)
Hemoglobin: 9.7 g/dL — ABNORMAL LOW (ref 12.0–15.0)
MCH: 27.7 pg (ref 26.0–34.0)
MCHC: 33.7 g/dL (ref 30.0–36.0)
MCV: 82.3 fL (ref 80.0–100.0)
Platelets: 124 10*3/uL — ABNORMAL LOW (ref 150–400)
RBC: 3.5 MIL/uL — ABNORMAL LOW (ref 3.87–5.11)
RDW: 16.4 % — ABNORMAL HIGH (ref 11.5–15.5)
WBC: 4.6 10*3/uL (ref 4.0–10.5)
nRBC: 0 % (ref 0.0–0.2)

## 2023-06-21 LAB — MAGNESIUM: Magnesium: 1.8 mg/dL (ref 1.7–2.4)

## 2023-06-21 MED ORDER — SODIUM BICARBONATE 8.4 % IV SOLN
100.0000 meq | Freq: Once | INTRAVENOUS | Status: AC
  Administered 2023-06-21: 100 meq via INTRAVENOUS
  Filled 2023-06-21: qty 50

## 2023-06-21 MED ORDER — DEXTROSE-SODIUM CHLORIDE 5-0.9 % IV SOLN: INTRAVENOUS | Status: DC

## 2023-06-21 MED ORDER — MELATONIN 5 MG PO TABS
10.0000 mg | ORAL_TABLET | Freq: Once | ORAL | Status: AC
  Administered 2023-06-22: 10 mg via ORAL
  Filled 2023-06-21: qty 2

## 2023-06-21 MED ORDER — DEXTROSE 50 % IV SOLN
12.5000 g | INTRAVENOUS | Status: AC
Start: 2023-06-21 — End: 2023-06-21
  Administered 2023-06-21: 12.5 g via INTRAVENOUS
  Filled 2023-06-21: qty 50

## 2023-06-21 NOTE — TOC Initial Note (Signed)
Transition of Care [facility]) - Initial/Assessment Note    Patient Details  Name: Yvette [name] MRN: [id number] Date of Birth: 25-Feb-1981  [facility] CM/SW Contact:    Marquita Palms, LCSW Phone Number: 06/21/2023, 11:14 AM  Clinical Narrative:                  CSW met with patient and her daughter at bedside. Patient reported that she has pharmacy and PCP. Patient reported she lives with her husband and her daughter. Patient reported that she does not have any DME at home. Patient asked about when she could leave the ED. Patient has no other needs at this time.        Patient Goals and CMS Choice            Expected Discharge Plan and Services                                              Prior Living Arrangements/Services                       Activities of Daily Living      Permission Sought/Granted                  Emotional Assessment              Admission diagnosis:  Intractable nausea and vomiting [R11.2] Patient Active Problem List   Diagnosis Date Noted   Intractable nausea and vomiting 06/20/2023   Transaminitis 06/20/2023   Alcohol abuse 05/17/2023   Cyclic vomiting syndrome 05/16/2023   Fatty liver 05/10/2023   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 03/2022 08/02/2022   History of Prolonged QT interval 08/02/2022   Iron deficiency 07/22/2022   Suicide ideation 04/05/2022   Delirium tremens (HCC) 04/05/2022   Hypovolemic shock (HCC) 04/05/2022   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   Dilated cardiomyopathy (HCC)    Hypophosphatemia 04/01/2022   Acute hypoxemic respiratory failure (HCC) 04/01/2022   Chronic systolic CHF (congestive heart failure) (HCC) 04/01/2022   COVID-19 virus infection 04/01/2022   Ventricular fibrillation (HCC) 03/30/2022   Hypomagnesemia 01/09/2022   Nausea vomiting and diarrhea 01/08/2022   Tobacco abuse 06/28/2021   Alcohol use disorder, severe,  dependence (HCC) 06/28/2021   Hematemesis 06/28/2021   Alcoholic liver disease, unspecified (HCC) 10/26/2020   Alcohol withdrawal (HCC) 10/26/2020   Alcoholic ketoacidosis 10/26/2020   AKI (acute kidney injury) (HCC) 10/26/2020   High serum osmolar gap 10/26/2020   Hiatal hernia    Portal hypertension (HCC)    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   Intractable vomiting 10/01/2019   Alcohol use, daily 09/30/2019   Elevated LFTs 09/30/2019   Thrombocytopenia (HCC) 09/30/2019   Hypokalemia    Dehydration    Food poisoning    Other neutropenia (HCC) 06/11/2016   Leukopenia 02/03/2015   PCP:  Jodi Marble, NP Pharmacy:   [facility] [address]), [place] - [address] [place]) [place] [address] Phone: [phone] Fax: [phone]     Social Determinants of Health (SDOH) Social History: SDOH Screenings   Food Insecurity: No Food Insecurity (05/17/2023)  Housing: Low Risk  (05/17/2023)  Transportation Needs: No Transportation Needs (05/17/2023)  Utilities: Not At Risk (05/17/2023)  Tobacco Use: High Risk (06/20/2023)   SDOH Interventions:  Readmission Risk Interventions    06/21/2023   11:13 AM  Readmission Risk Prevention Plan  Transportation Screening Complete  PCP or Specialist Appt within 5-7 Days Complete  Home Care Screening Complete  Medication Review (RN CM) Complete

## 2023-06-21 NOTE — ED Notes (Signed)
[phone]   Raymondo Band dtr   [phone]  Mr Husa pt father

## 2023-06-21 NOTE — Progress Notes (Signed)
[facility] Progress Note  Patient: Yvette [name]    ZOX:[id number]  DOA: 06/20/2023     Date of Service: the patient was seen and examined on 06/21/2023  Chief Complaint  Patient presents with   Emesis   Brief hospital course: Yvette [name] is a 42 y.o. female with medical history significant of alcohol abuse, asthma, , nonischemic cardiomyopathy with recovered EF secondary to VF arrest, iron deficiency anemia, tobacco use disorder, Kawasaki disease , history of prolonged QT, history of alcohol associated nausea vomiting and gastritis presenting with intractable nausea and vomiting.  Patient reports recurrence nausea vomiting the past 4 to 5 hours.  Patient states she had alcohol binge drinking roughly 4 beers.  Has had intractable nausea vomiting since this point.  No reported black or bloody emesis.  No chest pain or shortness of breath.  No abdominal pain.  No focal hemiparesis or confusion.  This has been a recurring issue associated with alcohol use. Presented to the ER afebrile, heart rate 80s to 100s, BP stable.  Satting well on room air.  White count 2, hemoglobin 12.2, platelets 160, creatinine 0.62, AST 184, ALT 62, lipase 26, alcohol level 282, urinalysis not indicative of infection, urine drug screen negative.   Assessment and Plan:  # Intractable vomiting Intractable nausea vomiting in setting of alcohol binge Recurring issue  As needed antiemetics IV fluid hydration Monitor   # Transaminitis AST 184, ALT 62 Likely secondary to alcohol abuse Monitor   # Alcohol abuse Alcohol level 280 on admission, she drank 4 large cans of beer on the day of admission  Discussed cessation at length Withdrawal protocol Continue thiamine, folate and multivitamin Monitor   # Metabolic acidosis due to alcohol abuse and intractable nausea vomiting Bicarb 100 mEq IV injection given Monitor BMP daily  # Hypoglycemia due to poor oral intake Started D5 NS Monitor CBG and follow  hypoglycemia protocol   # History of Prolonged QT interval QTc in the 450s  Monitor   # Tobacco abuse 1/4 pack/day smoker Discussed cessation Nicotine patch   # Alcoholic gastritis IV PPI   Body mass index is 21.73 kg/m.  Interventions:  Diet: Soft diet DVT Prophylaxis: Subcutaneous Lovenox   Advance goals of care discussion: Full code  Family Communication: family was present at bedside, at the time of interview.  The pt provided permission to discuss medical plan with the family. Opportunity was given to ask question and all questions were answered satisfactorily.   Disposition:  Pt is from Home, admitted with intractable nausea and vomiting due to EtOH, still has N/V, decreased p.o. intake, which precludes a safe discharge. Discharge to Home, when stable, most likely in 1 to 2 days.  Subjective: No significant events overnight, patient did vomit few times since early morning, still has decreased p.o. intake on only liquids.  Denies any abdominal pain, no chest pain or palpitation no shortness of breath.  No EtOH withdrawal symptoms at this time.  Physical Exam: General: NAD, lying comfortably Appear in no distress, affect appropriate Eyes: PERRLA ENT: Oral Mucosa Clear, moist  Neck: no JVD,  Cardiovascular: S1 and S2 Present, no Murmur,  Respiratory: good respiratory effort, Bilateral Air entry equal and Decreased, no Crackles, no wheezes Abdomen: Bowel Sound present, Soft and no tenderness,  Skin: no rashes Extremities: no Pedal edema, no calf tenderness Neurologic: without any new focal findings Gait not checked due to patient safety concerns  Vitals:   06/21/23 1030 06/21/23 1100 06/21/23 1130  06/21/23 1210  BP: 132/85 (!) 130/90 125/83   Pulse: (!) 106 (!) 109 (!) 104   Resp: 18 18 19    Temp:    98.2 F (36.8 C)  TempSrc:    Oral  SpO2: 100% 100% 100%   Weight:      Height:        Intake/Output Summary (Last 24 hours) at 06/21/2023 1401 Last data  filed at 06/21/2023 0802 Gross per 24 hour  Intake 878.7 ml  Output --  Net 878.7 ml   Filed Weights   06/20/23 0506  Weight: 52.2 kg    Data Reviewed: I have personally reviewed and interpreted daily labs, tele strips, imagings as discussed above. I reviewed all nursing notes, pharmacy notes, vitals, pertinent old records I have discussed plan of care as described above with RN and patient/family.  CBC: Recent Labs  Lab 06/20/23 0507 06/21/23 0611  WBC 2.0* 4.6  HGB 12.2 9.7*  HCT 35.2* 28.8*  MCV 80.7 82.3  PLT 160 124*   Basic Metabolic Panel: Recent Labs  Lab 06/20/23 0507 06/21/23 0611  NA 141 137  K 3.4* 4.2  CL 105 108  CO2 20* 16*  GLUCOSE 80 66*  BUN 5* 7  CREATININE 0.62 0.84  CALCIUM 8.7* 8.9  MG  --  1.8  PHOS  --  3.5    Studies: No results found.  Scheduled Meds:  enoxaparin (LOVENOX) injection  40 mg Subcutaneous Q24H   folic acid  1 mg Intravenous Daily   multivitamin with minerals  1 tablet Oral Daily   nicotine  7 mg Transdermal Daily   pantoprazole (PROTONIX) IV  40 mg Intravenous Q12H   thiamine (VITAMIN B1) injection  100 mg Intravenous Daily   Continuous Infusions:  dextrose 5 % and 0.9 % NaCl 75 mL/hr at 06/21/23 0842   PRN Meds: LORazepam **OR** LORazepam, promethazine (PHENERGAN) injection (IM or IVPB)  Time spent: 35 minutes  Author: Gillis Santa. MD [facility] 06/21/2023 2:01 PM  To reach On-call, see care teams to locate the attending and reach out to them via [email or web address]. If 7PM-7AM, please contact night-coverage If you still have difficulty reaching the attending provider, please page the [facility] (Director on Call) for [facility] on amion for assistance.

## 2023-06-21 NOTE — Plan of Care (Signed)
CIWA protocol, no symptoms of alcohol withdrawal at this time. Nausea managed with antiemetics.  Problem: Education: Goal: Knowledge of General Education information will improve Description: Including pain rating scale, medication(s)/side effects and non-pharmacologic comfort measures Outcome: Progressing   Problem: Health Behavior/Discharge Planning: Goal: Ability to manage health-related needs will improve Outcome: Progressing   Problem: Clinical Measurements: Goal: Ability to maintain clinical measurements within normal limits will improve Outcome: Progressing Goal: Will remain free from infection Outcome: Progressing Goal: Diagnostic test results will improve Outcome: Progressing Goal: Respiratory complications will improve Outcome: Progressing Goal: Cardiovascular complication will be avoided Outcome: Progressing   Problem: Activity: Goal: Risk for activity intolerance will decrease Outcome: Progressing   Problem: Nutrition: Goal: Adequate nutrition will be maintained Outcome: Progressing   Problem: Coping: Goal: Level of anxiety will decrease Outcome: Progressing   Problem: Elimination: Goal: Will not experience complications related to bowel motility Outcome: Progressing Goal: Will not experience complications related to urinary retention Outcome: Progressing   Problem: Pain Management: Goal: General experience of comfort will improve Outcome: Progressing   Problem: Safety: Goal: Ability to remain free from injury will improve Outcome: Progressing   Problem: Skin Integrity: Goal: Risk for impaired skin integrity will decrease Outcome: Progressing

## 2023-06-21 NOTE — ED Notes (Signed)
Assumed patient care. Received report from the previous nurse. Patient appears to be resting comfortably at the moment

## 2023-06-22 DIAGNOSIS — R112 Nausea with vomiting, unspecified: Secondary | ICD-10-CM | POA: Diagnosis not present

## 2023-06-22 LAB — BASIC METABOLIC PANEL
Anion gap: 4 — ABNORMAL LOW (ref 5–15)
Anion gap: 8 (ref 5–15)
BUN: 5 mg/dL — ABNORMAL LOW (ref 6–20)
BUN: 5 mg/dL — ABNORMAL LOW (ref 6–20)
CO2: 24 mmol/L (ref 22–32)
CO2: 23 mmol/L (ref 22–32)
Calcium: 8.4 mg/dL — ABNORMAL LOW (ref 8.9–10.3)
Calcium: 8.5 mg/dL — ABNORMAL LOW (ref 8.9–10.3)
Chloride: 109 mmol/L (ref 98–111)
Chloride: 104 mmol/L (ref 98–111)
Creatinine, Ser: 0.58 mg/dL (ref 0.44–1.00)
Creatinine, Ser: 0.58 mg/dL (ref 0.44–1.00)
GFR, Estimated: >60 mL/min (ref >60)
GFR, Estimated: >60 mL/min (ref >60)
Glucose, Bld: 107 mg/dL — ABNORMAL HIGH (ref 70–99)
Glucose, Bld: 107 mg/dL — ABNORMAL HIGH (ref 70–99)
Potassium: 3.1 mmol/L — ABNORMAL LOW (ref 3.5–5.1)
Potassium: 3.6 mmol/L (ref 3.5–5.1)
Sodium: 137 mmol/L (ref 135–145)
Sodium: 135 mmol/L (ref 135–145)

## 2023-06-22 LAB — CBC
HCT: 29.2 % — ABNORMAL LOW (ref 36.0–46.0)
Hemoglobin: 10 g/dL — ABNORMAL LOW (ref 12.0–15.0)
MCH: 27.6 pg (ref 26.0–34.0)
MCHC: 34.2 g/dL (ref 30.0–36.0)
MCV: 80.7 fL (ref 80.0–100.0)
Platelets: 116 10*3/uL — ABNORMAL LOW (ref 150–400)
RBC: 3.62 MIL/uL — ABNORMAL LOW (ref 3.87–5.11)
RDW: 15.9 % — ABNORMAL HIGH (ref 11.5–15.5)
WBC: 3.2 10*3/uL — ABNORMAL LOW (ref 4.0–10.5)
nRBC: 0 % (ref 0.0–0.2)

## 2023-06-22 LAB — PHOSPHORUS
Phosphorus: 1.3 mg/dL — ABNORMAL LOW (ref 2.5–4.6)
Phosphorus: 3 mg/dL (ref 2.5–4.6)

## 2023-06-22 LAB — MAGNESIUM
Magnesium: 1.4 mg/dL — ABNORMAL LOW (ref 1.7–2.4)
Magnesium: 2 mg/dL (ref 1.7–2.4)

## 2023-06-22 MED ORDER — MAGNESIUM SULFATE 2 GM/50ML IV SOLN
2.0000 g | Freq: Once | INTRAVENOUS | Status: AC
  Administered 2023-06-22: 2 g via INTRAVENOUS
  Filled 2023-06-22: qty 50

## 2023-06-22 MED ORDER — SACCHAROMYCES BOULARDII 250 MG PO CAPS
250.0000 mg | ORAL_CAPSULE | Freq: Two times a day (BID) | ORAL | Status: DC
  Administered 2023-06-22 – 2023-06-23 (×2): 250 mg via ORAL
  Filled 2023-06-22 (×2): qty 1

## 2023-06-22 MED ORDER — MELATONIN 5 MG PO TABS
10.0000 mg | ORAL_TABLET | Freq: Once | ORAL | Status: AC
  Administered 2023-06-22: 10 mg via ORAL
  Filled 2023-06-22: qty 2

## 2023-06-22 MED ORDER — POTASSIUM CHLORIDE CRYS ER 20 MEQ PO TBCR
40.0000 meq | EXTENDED_RELEASE_TABLET | Freq: Once | ORAL | Status: AC
  Administered 2023-06-22: 40 meq via ORAL
  Filled 2023-06-22: qty 2

## 2023-06-22 MED ORDER — THIAMINE MONONITRATE 100 MG PO TABS
100.0000 mg | ORAL_TABLET | Freq: Every day | ORAL | Status: DC
Start: 2023-06-23 — End: 2023-06-23
  Administered 2023-06-23: 100 mg via ORAL
  Filled 2023-06-22: qty 1

## 2023-06-22 MED ORDER — FOLIC ACID 1 MG PO TABS
1.0000 mg | ORAL_TABLET | Freq: Every day | ORAL | Status: DC
Start: 2023-06-23 — End: 2023-06-23
  Administered 2023-06-23: 1 mg via ORAL
  Filled 2023-06-22: qty 1

## 2023-06-22 MED ORDER — LOPERAMIDE HCL 2 MG PO CAPS: 2.0000 mg | ORAL_CAPSULE | ORAL | Status: DC | PRN

## 2023-06-22 MED ORDER — POTASSIUM PHOSPHATES 15 MMOLE/5ML IV SOLN
30.0000 mmol | Freq: Once | INTRAVENOUS | Status: AC
  Administered 2023-06-22: 30 mmol via INTRAVENOUS
  Filled 2023-06-22: qty 10

## 2023-06-22 NOTE — Progress Notes (Signed)
[facility] Progress Note  Patient: Yvette [name]    VZD:[id number]  DOA: 06/20/2023     Date of Service: the patient was seen and examined on 06/22/2023  Chief Complaint  Patient presents with   Emesis   Brief hospital course: WARDELL MULREADY is a 42 y.o. female with medical history significant of alcohol abuse, asthma, , nonischemic cardiomyopathy with recovered EF secondary to VF arrest, iron deficiency anemia, tobacco use disorder, Kawasaki disease , history of prolonged QT, history of alcohol associated nausea vomiting and gastritis presenting with intractable nausea and vomiting.  Patient reports recurrence nausea vomiting the past 4 to 5 hours.  Patient states she had alcohol binge drinking roughly 4 beers.  Has had intractable nausea vomiting since this point.  No reported black or bloody emesis.  No chest pain or shortness of breath.  No abdominal pain.  No focal hemiparesis or confusion.  This has been a recurring issue associated with alcohol use. Presented to the ER afebrile, heart rate 80s to 100s, BP stable.  Satting well on room air.  White count 2, hemoglobin 12.2, platelets 160, creatinine 0.62, AST 184, ALT 62, lipase 26, alcohol level 282, urinalysis not indicative of infection, urine drug screen negative.   Assessment and Plan:  # Intractable vomiting Intractable nausea vomiting in setting of alcohol binge Recurring issue  As needed antiemetics IV fluid hydration Monitor   # Transaminitis AST 184, ALT 62 Likely secondary to alcohol abuse Monitor   # Alcohol abuse Alcohol level 280 on admission, she drank 4 large cans of beer on the day of admission  Discussed cessation at length Withdrawal protocol Continue thiamine, folate and multivitamin Monitor   # Hypokalemia, potassium repleted. # Hypophosphatemia, Phos repleted. # Hypomagnesemia, mag repleted. Monitor electrolytes and replete as needed.  # Metabolic acidosis due to alcohol abuse and intractable  nausea vomiting Bicarb 100 mEq IV injection given Acidosis resolved Monitor BMP daily  # Hypoglycemia due to poor oral intake Started D5 NS Monitor CBG and follow hypoglycemia protocol   # History of Prolonged QT interval QTc in the 450s  Monitor   # Tobacco abuse 1/4 pack/day smoker Discussed cessation Nicotine patch   # Alcoholic gastritis IV PPI  # Diarrhea most most likely noninfectious Started probiotics twice daily Started Imodium as needed Watch for any symptoms of dehydration Monitor electrolytes and replete   Body mass index is 21.73 kg/m.  Interventions:  Diet: Soft diet DVT Prophylaxis: Subcutaneous Lovenox   Advance goals of care discussion: Full code  Family Communication: family was present at bedside, at the time of interview.  The pt provided permission to discuss medical plan with the family. Opportunity was given to ask question and all questions were answered satisfactorily.   Disposition:  Pt is from Home, admitted with intractable nausea and vomiting due to EtOH, still has N/V, decreased p.o. intake, which precludes a safe discharge. Discharge to Home, when stable, most likely in 1 to 2 days.  Subjective: No significant events overnight, patient did vomit few times since early morning, still has decreased p.o. intake on only liquids.  Denies any abdominal pain, no chest pain or palpitation no shortness of breath.  No EtOH withdrawal symptoms at this time.  Physical Exam: General: NAD, lying comfortably Appear in no distress, affect appropriate Eyes: PERRLA ENT: Oral Mucosa Clear, moist  Neck: no JVD,  Cardiovascular: S1 and S2 Present, no Murmur,  Respiratory: good respiratory effort, Bilateral Air entry equal and Decreased, no  Crackles, no wheezes Abdomen: Bowel Sound present, Soft and no tenderness,  Skin: no rashes Extremities: no Pedal edema, no calf tenderness Neurologic: without any new focal findings Gait not checked due to  patient safety concerns  Vitals:   06/21/23 2255 06/22/23 0143 06/22/23 0811 06/22/23 1559  BP: 132/84 113/74 120/89 120/87  Pulse: (!) 101 99 78 80  Resp:  20 16 16   Temp:  99.4 F (37.4 C) 98.5 F (36.9 C) 98.4 F (36.9 C)  TempSrc:  Oral    SpO2:  99% 99% 100%  Weight:      Height:        Intake/Output Summary (Last 24 hours) at 06/22/2023 1735 Last data filed at 06/22/2023 1602 Gross per 24 hour  Intake 1686.1 ml  Output --  Net 1686.1 ml   Filed Weights   06/20/23 0506  Weight: 52.2 kg    Data Reviewed: I have personally reviewed and interpreted daily labs, tele strips, imagings as discussed above. I reviewed all nursing notes, pharmacy notes, vitals, pertinent old records I have discussed plan of care as described above with RN and patient/family.  CBC: Recent Labs  Lab 06/20/23 0507 06/21/23 0611 06/22/23 0456  WBC 2.0* 4.6 3.2*  HGB 12.2 9.7* 10.0*  HCT 35.2* 28.8* 29.2*  MCV 80.7 82.3 80.7  PLT 160 124* 116*   Basic Metabolic Panel: Recent Labs  Lab 06/20/23 0507 06/21/23 0611 06/22/23 0456  NA 141 137 137  K 3.4* 4.2 3.1*  CL 105 108 109  CO2 20* 16* 24  GLUCOSE 80 66* 107*  BUN 5* 7 5*  CREATININE 0.62 0.84 0.58  CALCIUM 8.7* 8.9 8.4*  MG  --  1.8 1.4*  PHOS  --  3.5 1.3*    Studies: No results found.  Scheduled Meds:  enoxaparin (LOVENOX) injection  40 mg Subcutaneous Q24H   [START ON 06/23/2023] folic acid  1 mg Oral Daily   multivitamin with minerals  1 tablet Oral Daily   nicotine  7 mg Transdermal Daily   pantoprazole (PROTONIX) IV  40 mg Intravenous Q12H   [START ON 06/23/2023] thiamine  100 mg Oral Daily   Continuous Infusions:   PRN Meds: LORazepam **OR** LORazepam, promethazine (PHENERGAN) injection (IM or IVPB)  Time spent: 55 minutes  Author: Gillis Santa. MD [facility] 06/22/2023 5:35 PM  To reach On-call, see care teams to locate the attending and reach out to them via [email or web address]. If 7PM-7AM,  please contact night-coverage If you still have difficulty reaching the attending provider, please page the [facility] (Director on Call) for [facility] on amion for assistance.

## 2023-06-22 NOTE — Plan of Care (Signed)

## 2023-06-22 NOTE — Progress Notes (Signed)
PHARMACY CONSULT NOTE - ELECTROLYTES  Pharmacy Consult for Electrolyte Monitoring and Replacement   Recent Labs: Height: 5\' 1"  (154.9 cm) Weight: 52.2 kg (115 lb) IBW/kg (Calculated) : 47.8 Estimated Creatinine Clearance: 71.2 mL/min (by C-G formula based on SCr of 0.58 mg/dL). Potassium (mmol/L)  Date Value  06/22/2023 3.1 (L)   Magnesium (mg/dL)  Date Value  81/19/1478 1.4 (L)   Calcium (mg/dL)  Date Value  29/56/2130 8.4 (L)   Albumin (g/dL)  Date Value  86/57/8469 3.5  02/05/2021 4.9 (H)   Phosphorus (mg/dL)  Date Value  62/95/2841 1.3 (L)   Sodium (mmol/L)  Date Value  06/22/2023 137  02/05/2021 141   Corrected Ca: 8.8 mg/dL  Assessment  Yvette [name] is a 42 y.o. female presenting with intractable nausea and vomiting. PMH significant for alcohol abuse, asthma, nonischemic cardiomyopathy with recovered EF secondary to VF arrest, iron deficiency anemia, tobacco use disorder, Kawasaki disease , history of prolonged QT, history of alcohol associated nausea vomiting and gastritis. Pharmacy has been consulted to monitor and replace electrolytes.  Diet: PO MIVF: None Pertinent medications: None  Goal of Therapy: Electrolytes WNL  Plan:  MD ordered: Mag sulfate 2 g IV x 1 KCl 40 mEq PO x 2 Potassium phosphate 30 mmol x 1 Check BMP, Mg, Phos with AM labs  Thank you for allowing pharmacy to be a part of this patient's care.  Merryl Hacker, PharmD Clinical Pharmacist 06/22/2023 10:02 AM

## 2023-06-23 DIAGNOSIS — R112 Nausea with vomiting, unspecified: Secondary | ICD-10-CM | POA: Diagnosis not present

## 2023-06-23 LAB — MAGNESIUM: Magnesium: 2.7 mg/dL — ABNORMAL HIGH (ref 1.7–2.4)

## 2023-06-23 LAB — BASIC METABOLIC PANEL
Anion gap: 9 (ref 5–15)
BUN: 6 mg/dL (ref 6–20)
CO2: 21 mmol/L — ABNORMAL LOW (ref 22–32)
Calcium: 8.6 mg/dL — ABNORMAL LOW (ref 8.9–10.3)
Chloride: 103 mmol/L (ref 98–111)
Creatinine, Ser: 0.56 mg/dL (ref 0.44–1.00)
GFR, Estimated: >60 mL/min (ref >60)
Glucose, Bld: 87 mg/dL (ref 70–99)
Potassium: 4.2 mmol/L (ref 3.5–5.1)
Sodium: 133 mmol/L — ABNORMAL LOW (ref 135–145)

## 2023-06-23 LAB — CBC
HCT: 37.2 % (ref 36.0–46.0)
Hemoglobin: 12.2 g/dL (ref 12.0–15.0)
MCH: 27.3 pg (ref 26.0–34.0)
MCHC: 32.8 g/dL (ref 30.0–36.0)
MCV: 83.2 fL (ref 80.0–100.0)
Platelets: 100 10*3/uL — ABNORMAL LOW (ref 150–400)
RBC: 4.47 MIL/uL (ref 3.87–5.11)
RDW: 15.8 % — ABNORMAL HIGH (ref 11.5–15.5)
WBC: 2.9 10*3/uL — ABNORMAL LOW (ref 4.0–10.5)
nRBC: 0 % (ref 0.0–0.2)

## 2023-06-23 LAB — PHOSPHORUS: Phosphorus: 3.9 mg/dL (ref 2.5–4.6)

## 2023-06-23 MED ORDER — VITAMIN B-1 100 MG PO TABS: 100.0000 mg | ORAL_TABLET | Freq: Every day | ORAL | 0 refills | Status: AC

## 2023-06-23 MED ORDER — ADULT MULTIVITAMIN W/MINERALS CH: 1.0000 | ORAL_TABLET | Freq: Every day | ORAL | Status: DC

## 2023-06-23 NOTE — Progress Notes (Signed)
PHARMACY CONSULT NOTE - ELECTROLYTES  Pharmacy Consult for Electrolyte Monitoring and Replacement   Recent Labs: Height: 5\' 1"  (154.9 cm) Weight: 52.2 kg (115 lb) IBW/kg (Calculated) : 47.8 Estimated Creatinine Clearance: 70.5 mL/min (by C-G formula based on SCr of 0.56 mg/dL). Potassium (mmol/L)  Date Value  06/23/2023 4.2   Magnesium (mg/dL)  Date Value  19/14/7829 2.7 (H)   Calcium (mg/dL)  Date Value  56/21/3086 8.6 (L)   Albumin (g/dL)  Date Value  57/84/6962 3.5  02/05/2021 4.9 (H)   Phosphorus (mg/dL)  Date Value  95/28/4132 3.9   Sodium (mmol/L)  Date Value  06/23/2023 133 (L)  02/05/2021 141   Corrected Ca: 8.8 mg/dL  Assessment  Yvette [name] is a 42 y.o. female presenting with intractable nausea and vomiting. PMH significant for alcohol abuse, asthma, nonischemic cardiomyopathy with recovered EF secondary to VF arrest, iron deficiency anemia, tobacco use disorder, Kawasaki disease , history of prolonged QT, history of alcohol associated nausea vomiting and gastritis. Pharmacy has been consulted to monitor and replace electrolytes.  Diet: PO MIVF: None Pertinent medications: None  Goal of Therapy: Electrolytes WNL  Plan:  No supplementation warranted at this time Check BMP, Mg, Phos with AM labs  Thank you for allowing pharmacy to be a part of this patient's care.  Merryl Hacker, PharmD Clinical Pharmacist 06/23/2023 7:21 AM

## 2023-06-23 NOTE — Discharge Summary (Signed)
[facility] Discharge Summary   Patient: Yvette [name] [id number]  PCP: Jodi Marble, NP  Date of admission: 06/20/2023   Date of discharge:  06/23/2023     Discharge Diagnoses:  Principal Problem:   Intractable nausea and vomiting Active Problems:   Intractable vomiting   Alcoholic gastritis   Tobacco abuse   History of Prolonged QT interval   Alcohol abuse   Transaminitis   Admitted From: Home Disposition:  Home   Recommendations for Outpatient Follow-up:  PCP: PCP in 1 week Follow up LABS/TEST:     Follow-up Information     Jodi Marble, NP Follow up in 1 week(s).   Specialty: Nurse Practitioner Contact information: [address] [place] [address] [phone]                Diet recommendation: Regular  Activity: The patient is advised to gradually reintroduce usual activities, as tolerated  Discharge Condition: stable  Code Status: Full code   History of present illness: As per the H and P dictated on admission Hospital Course:  MEMPHIS MENDEN is a 42 y.o. female with medical history significant of alcohol abuse, asthma, , nonischemic cardiomyopathy with recovered EF secondary to VF arrest, iron deficiency anemia, tobacco use disorder, Kawasaki disease , history of prolonged QT, history of alcohol associated nausea vomiting and gastritis presenting with intractable nausea and vomiting.  Patient reports recurrence nausea vomiting the past 4 to 5 hours.  Patient states she had alcohol binge drinking roughly 4 beers.  Has had intractable nausea vomiting since this point.  No reported black or bloody emesis.  No chest pain or shortness of breath.  No abdominal pain.  No focal hemiparesis or confusion.  This has been a recurring issue associated with alcohol use. ED w/up: Afebrile, heart rate 80s to 100s, BP stable.  Satting well on room air.  White count 2, hemoglobin 12.2, platelets 160, creatinine 0.62, AST 184, ALT 62, lipase 26, alcohol  level 282, urinalysis not indicative of infection, urine drug screen negative.     Assessment and Plan:   # Intractable vomiting.  Resolved Intractable nausea and vomiting in setting of alcohol binge, and gastritis secondary to EtOH use.  Continue pantoprazole symptomatic treatment for nausea vomiting.  IV fluid for hydration.  Patient's nausea and vomiting resolved, tolerating diet well.  Patient is stable to discharge at home today.  Patient agreed with the discharge planning.   # Transaminitis,  AST 184, ALT 62, Likely secondary to alcohol abuse.  ALT AST improved. # Alcohol abuse Alcohol level 280 on admission, she drank 4 large cans of beer on the day of admission. Discussed cessation at length. S/p Withdrawal protocol but no withdrawal symptoms. S/p thiamine, folate and multivitamin.  Patient was discharged on thiamine 100 mg p.o. daily for 30 days and multivitamin # Hypokalemia, potassium repleted.  Resolved # Hypophosphatemia, Phos repleted.  Resolved # Hypomagnesemia, mag repleted.  Resolved # Metabolic acidosis due to alcohol abuse and intractable nausea vomiting Bicarb 100 mEq IV injection given.  Acidosis resolved # Hypoglycemia due to poor oral intake, resolved s/p D5 NS.  Diet improved, no more hypoglycemia episode noticed during hospital stay. # History of Prolonged QT interval: QTc in the 450s, not any cardiac symptoms.  Patient was advised to follow with PCP and advised to avoid QT prolongation medications.  # Tobacco abuse, 1/4 pack/day smoker. Discussed cessation. S/p Nicotine patch # Alcoholic gastritis s/p PPI # Diarrhea most likely noninfectious, s/p probiotics twice  daily and  Imodium as needed.  Diarrhea resolved  Body mass index is 21.73 kg/m.  Nutrition Interventions:   Patient was ambulatory without any assistance. On the day of the discharge the patient's vitals were stable, and no other acute medical condition were reported by patient. the patient was felt  safe to be discharge at Home.  Consultants: None Procedures: None  Discharge Exam: General: Appear in no distress, no Rash; Oral Mucosa Clear, moist. Cardiovascular: S1 and S2 Present, no Murmur, Respiratory: normal respiratory effort, Bilateral Air entry present and no Crackles, no wheezes Abdomen: Bowel Sound present, Soft and no tenderness, no hernia Extremities: no Pedal edema, no calf tenderness Neurology: alert and oriented to time, place, and person affect appropriate.  Filed Weights   06/20/23 0506  Weight: 52.2 kg   Vitals:   06/23/23 0409 06/23/23 0755  BP: (!) 142/87 (!) 130/100  Pulse: 62 69  Resp: 16 18  Temp: 98.6 F (37 C) 98.4 F (36.9 C)  SpO2: 100% 98%    DISCHARGE MEDICATION: Allergies as of 06/23/2023       Reactions   Inapsine [droperidol] Anaphylaxis   Zofran [ondansetron] Other (See Comments)   Prolonged QT        Medication List     STOP taking these medications    DEPO-PROVERA IM   folic acid 1 MG tablet Commonly known as: FOLVITE   naltrexone 50 MG tablet Commonly known as: DEPADE   nicotine 7 mg/24hr patch Commonly known as: NICODERM CQ - dosed in mg/24 hr   nicotine polacrilex 4 MG lozenge Commonly known as: Nicotine Mini   sucralfate 1 g tablet Commonly known as: CARAFATE       TAKE these medications    multivitamin with minerals Tabs tablet Take 1 tablet by mouth daily. Start taking on: June 24, 2023   pantoprazole 40 MG tablet Commonly known as: PROTONIX Take 1 tablet (40 mg total) by mouth daily.   promethazine 12.5 MG tablet Commonly known as: PHENERGAN Take 1 tablet (12.5 mg total) by mouth every 6 (six) hours as needed for nausea or vomiting.   thiamine 100 MG tablet Commonly known as: Vitamin B-1 Take 1 tablet (100 mg total) by mouth daily.       Allergies  Allergen Reactions   Inapsine [Droperidol] Anaphylaxis   Zofran [Ondansetron] Other (See Comments)    Prolonged QT   Discharge  Instructions     Call MD for:  difficulty breathing, headache or visual disturbances   Complete by: As directed    Call MD for:  extreme fatigue   Complete by: As directed    Call MD for:  persistant dizziness or light-headedness   Complete by: As directed    Call MD for:  persistant nausea and vomiting   Complete by: As directed    Call MD for:  severe uncontrolled pain   Complete by: As directed    Call MD for:  temperature >100.4   Complete by: As directed    Diet - low sodium heart healthy   Complete by: As directed    Discharge instructions   Complete by: As directed    Follow-up with PCP in 1 week   Increase activity slowly   Complete by: As directed        The results of significant diagnostics from this hospitalization (including imaging, microbiology, ancillary and laboratory) are listed below for reference.    Significant Diagnostic Studies: No results found.  Microbiology: No results  found for this or any previous visit (from the past 240 hours).   Labs: CBC: Recent Labs  Lab 06/20/23 0507 06/21/23 0611 06/22/23 0456 06/23/23 0501  WBC 2.0* 4.6 3.2* 2.9*  HGB 12.2 9.7* 10.0* 12.2  HCT 35.2* 28.8* 29.2* 37.2  MCV 80.7 82.3 80.7 83.2  PLT 160 124* 116* 100*   Basic Metabolic Panel: Recent Labs  Lab 06/20/23 0507 06/21/23 0611 06/22/23 0456 06/22/23 2120 06/23/23 0501  NA 141 137 137 135 133*  K 3.4* 4.2 3.1* 3.6 4.2  CL 105 108 109 104 103  CO2 20* 16* 24 23 21*  GLUCOSE 80 66* 107* 107* 87  BUN 5* 7 5* 5* 6  CREATININE 0.62 0.84 0.58 0.58 0.56  CALCIUM 8.7* 8.9 8.4* 8.5* 8.6*  MG  --  1.8 1.4* 2.0 2.7*  PHOS  --  3.5 1.3* 3.0 3.9   Liver Function Tests: Recent Labs  Lab 06/20/23 0507 06/21/23 0611  AST 184* 78*  ALT 62* 44  ALKPHOS 87 63  BILITOT 0.6 1.3*  PROT 8.0 6.7  ALBUMIN 4.4 3.5   Recent Labs  Lab 06/20/23 0507  LIPASE 26   No results for input(s): "AMMONIA" in the last 168 hours. Cardiac Enzymes: No results for  input(s): "CKTOTAL", "CKMB", "CKMBINDEX", "TROPONINI" in the last 168 hours. BNP (last 3 results) No results for input(s): "BNP" in the last 8760 hours. CBG: Recent Labs  Lab 06/21/23 0805 06/21/23 1149  GLUCAP 61* 92    Time spent: 35 minutes  Signed:  Gillis Santa  [facility]  06/23/2023 1:46 PM

## 2023-06-23 NOTE — Plan of Care (Signed)

## 2023-07-26 ENCOUNTER — Inpatient Hospital Stay: Payer: Medicaid Other | Attending: Internal Medicine

## 2023-07-26 DIAGNOSIS — D649 Anemia, unspecified: Secondary | ICD-10-CM | POA: Insufficient documentation

## 2023-07-26 DIAGNOSIS — K760 Fatty (change of) liver, not elsewhere classified: Secondary | ICD-10-CM | POA: Insufficient documentation

## 2023-07-26 DIAGNOSIS — D709 Neutropenia, unspecified: Secondary | ICD-10-CM | POA: Insufficient documentation

## 2023-07-29 ENCOUNTER — Inpatient Hospital Stay: Payer: Medicaid Other

## 2023-07-29 ENCOUNTER — Inpatient Hospital Stay: Payer: Medicaid Other | Admitting: Internal Medicine

## 2023-07-29 DIAGNOSIS — K760 Fatty (change of) liver, not elsewhere classified: Secondary | ICD-10-CM | POA: Diagnosis not present

## 2023-07-29 DIAGNOSIS — D649 Anemia, unspecified: Secondary | ICD-10-CM | POA: Diagnosis not present

## 2023-07-29 DIAGNOSIS — D708 Other neutropenia: Secondary | ICD-10-CM

## 2023-07-29 DIAGNOSIS — D709 Neutropenia, unspecified: Secondary | ICD-10-CM | POA: Diagnosis present

## 2023-07-29 LAB — CBC WITH DIFFERENTIAL (CANCER CENTER ONLY)
Abs Immature Granulocytes: 0.01 10*3/uL (ref 0.00–0.07)
Basophils Absolute: 0 10*3/uL (ref 0.0–0.1)
Basophils Relative: 1 %
Eosinophils Absolute: 0.1 10*3/uL (ref 0.0–0.5)
Eosinophils Relative: 2 %
HCT: 33.7 % — ABNORMAL LOW (ref 36.0–46.0)
Hemoglobin: 11.1 g/dL — ABNORMAL LOW (ref 12.0–15.0)
Immature Granulocytes: 0 %
Lymphocytes Relative: 36 %
Lymphs Abs: 0.9 10*3/uL (ref 0.7–4.0)
MCH: 27.2 pg (ref 26.0–34.0)
MCHC: 32.9 g/dL (ref 30.0–36.0)
MCV: 82.6 fL (ref 80.0–100.0)
Monocytes Absolute: 0.4 10*3/uL (ref 0.1–1.0)
Monocytes Relative: 16 %
Neutro Abs: 1.1 10*3/uL — ABNORMAL LOW (ref 1.7–7.7)
Neutrophils Relative %: 45 %
Platelet Count: 170 10*3/uL (ref 150–400)
RBC: 4.08 MIL/uL (ref 3.87–5.11)
RDW: 17.6 % — ABNORMAL HIGH (ref 11.5–15.5)
WBC Count: 2.6 10*3/uL — ABNORMAL LOW (ref 4.0–10.5)
nRBC: 0 % (ref 0.0–0.2)

## 2023-07-29 LAB — CMP (CANCER CENTER ONLY)
ALT: 21 U/L (ref 0–44)
AST: 56 U/L — ABNORMAL HIGH (ref 15–41)
Albumin: 4.4 g/dL (ref 3.5–5.0)
Alkaline Phosphatase: 64 U/L (ref 38–126)
Anion gap: 13 (ref 5–15)
BUN: 7 mg/dL (ref 6–20)
CO2: 19 mmol/L — ABNORMAL LOW (ref 22–32)
Calcium: 8.8 mg/dL — ABNORMAL LOW (ref 8.9–10.3)
Chloride: 102 mmol/L (ref 98–111)
Creatinine: 0.51 mg/dL (ref 0.44–1.00)
GFR, Estimated: >60 mL/min (ref >60)
Glucose, Bld: 88 mg/dL (ref 70–99)
Potassium: 3.7 mmol/L (ref 3.5–5.1)
Sodium: 134 mmol/L — ABNORMAL LOW (ref 135–145)
Total Bilirubin: 0.5 mg/dL (ref 0.0–1.2)
Total Protein: 8.1 g/dL (ref 6.5–8.1)

## 2023-07-29 LAB — IRON AND TIBC
Iron: 31 ug/dL (ref 28–170)
Saturation Ratios: 6 % — ABNORMAL LOW (ref 10.4–31.8)
TIBC: 501 ug/dL — ABNORMAL HIGH (ref 250–450)
UIBC: 470 ug/dL

## 2023-07-29 LAB — FERRITIN: Ferritin: 16 ng/mL (ref 11–307)

## 2023-07-29 LAB — VITAMIN B12: Vitamin B-12: 327 pg/mL (ref 180–914)

## 2023-07-29 LAB — LACTATE DEHYDROGENASE: LDH: 161 U/L (ref 98–192)

## 2023-07-29 LAB — FOLATE: Folate: 11.9 ng/mL (ref >5.9)

## 2023-08-02 ENCOUNTER — Encounter: Payer: Self-pay | Admitting: Nurse Practitioner

## 2023-08-02 ENCOUNTER — Inpatient Hospital Stay (HOSPITAL_BASED_OUTPATIENT_CLINIC_OR_DEPARTMENT_OTHER): Payer: Medicaid Other | Admitting: Nurse Practitioner

## 2023-08-02 ENCOUNTER — Inpatient Hospital Stay: Payer: Medicaid Other

## 2023-08-02 VITALS — BP 130/102 | HR 106 | Temp 97.2°F | Wt 113.0 lb

## 2023-08-02 DIAGNOSIS — E611 Iron deficiency: Secondary | ICD-10-CM | POA: Diagnosis not present

## 2023-08-02 DIAGNOSIS — D708 Other neutropenia: Secondary | ICD-10-CM

## 2023-08-02 DIAGNOSIS — D709 Neutropenia, unspecified: Secondary | ICD-10-CM | POA: Diagnosis not present

## 2023-08-02 MED ORDER — SODIUM CHLORIDE 0.9 % IV SOLN
200.0000 mg | Freq: Once | INTRAVENOUS | Status: DC
  Filled 2023-08-02: qty 10

## 2023-08-02 MED ORDER — IRON SUCROSE 20 MG/ML IV SOLN: 200.0000 mg | Freq: Once | INTRAVENOUS | Status: AC

## 2023-08-02 MED ORDER — SODIUM CHLORIDE 0.9% FLUSH
10.0000 mL | Freq: Once | INTRAVENOUS | Status: AC | PRN
  Filled 2023-08-02: qty 10

## 2023-08-02 NOTE — Progress Notes (Unsigned)
[facility] OFFICE PROGRESS NOTE  Patient Care Team: Jodi Marble, NP as PCP - General (Nurse Practitioner) Iran Ouch, MD as PCP - Cardiology (Cardiology) Earna Coder, MD as Consulting Physician (Internal Medicine)   SUMMARY OF ONCOLOGIC HISTORY:  # NOV 2015 WBC- 2.2/ANC- 1200/ALC-500; [name]; 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 Normal cytogenetics  # hx of SVT likely secondary to electrolyte disturbance. S/p evaluation with cardiology.  # OCT 2016- Bil indeterminate lung nodules-CT  In July 2017- Improving.   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  Alone.  A pleasant 43 year-old African-American female patient with above history of chronic mild leukopenia/neutropenia; history of alcoholism is here for follow-up.  Patient unfortunately continues to drink alcohol.  She also smokes.   No weight loss or night sweats.   She does complain of hot flashes-she is on Lupron. Menstrual cycles- none at this time.   Review of Systems  Constitutional:  Negative for chills, diaphoresis, fever, [name], hemoptysis, sputum production, [name], palpitations, orthopnea and leg swelling.  Gastrointestinal:  Negative for abdominal pain, blood in stool, constipation, diarrhea, heartburn, melena, nausea and vomiting.  Genitourinary:  Negative for dysuria, frequency and urgency.  Musculoskeletal:  Negative for back pain and joint pain.  Skin: Negative.  Negative for itching and rash.  Neurological:  Negative for dizziness, tingling, focal weakness, weakness and  headaches.  Endo/Heme/Allergies:  Does not bruise/bleed easily.  Psychiatric/Behavioral:  Negative for depression. The patient is not nervous/anxious and does not have insomnia.     PAST MEDICAL HISTORY :  Past Medical History:  Diagnosis Date  . Asthma   . Cardiac arrest (HCC)   . ETOH abuse   . H/O Kawasaki's disease    as 30 month old child and at 61 years  . History of anemia   . History of blood transfusion 1984  . Hypokalemia   . Hypomagnesemia    a. in the setting of n/v->gastritis/etoh.  . Leukopenia   . Neutropenia (HCC)   . NICM (nonischemic cardiomyopathy) (HCC)    a. 06/2021 Echo: EF 50-55%, [name]; b. 09/2021 ETT: Ex time 6:19, [name], [name]; c. 03/2022 Echo: EF 35-40%, [name]; d. 08/2022 Echo: EF 60-65%, no rwma, [name].  Marland Kitchen NSVT (nonsustained ventricular tachycardia) (HCC)    a. Noted during admission 06/2021 assoc w/ palpitations and presyncope in the setting of profound QT prolongation/gastritis.  . Tobacco abuse   . Transaminitis     PAST SURGICAL HISTORY :   Past Surgical History:  Procedure Laterality Date  . ESOPHAGOGASTRODUODENOSCOPY N/A 10/29/2020   Procedure: ESOPHAGOGASTRODUODENOSCOPY (EGD);  Surgeon: Wyline Mood, MD;  Location: [facility] ENDOSCOPY;  Service: Gastroenterology;  [name];  . ESOPHAGOGASTRODUODENOSCOPY (EGD) WITH PROPOFOL N/A 03/14/2020   Procedure: ESOPHAGOGASTRODUODENOSCOPY (EGD) WITH PROPOFOL;  Surgeon: Pasty Spillers, MD;  [name];  Service: Endoscopy;  [name];  . ESOPHAGOGASTRODUODENOSCOPY (EGD) WITH PROPOFOL N/A 02/25/2021   Procedure: ESOPHAGOGASTRODUODENOSCOPY (EGD) WITH PROPOFOL;  Surgeon: Pasty Spillers, MD;  [name];  Service: Endoscopy;  [name];  .  ESOPHAGOGASTRODUODENOSCOPY (EGD) WITH PROPOFOL N/A 06/25/2022   Procedure: ESOPHAGOGASTRODUODENOSCOPY (EGD) WITH PROPOFOL;  Surgeon: Jaynie Collins, DO;  Location: [facility]  ENDOSCOPY;  Service: Gastroenterology;  [name];    FAMILY HISTORY :   Family History  Problem Relation Age of Onset  . Lupus Mother   . Prostate cancer Father   . Cancer Paternal Aunt        Breast   . Cancer Paternal Aunt        Breast     SOCIAL HISTORY:   Social History   Tobacco Use  . Smoking status: Every Day    Current packs/day: 0.25    Average packs/day: 0.3 packs/day for 10.0 years (2.5 ttl pk-yrs)    Types: Cigarettes  . Smokeless tobacco: Never  . Tobacco comments:    05/27/22 3 cig per day   Vaping Use  . Vaping status: Never Used  Substance Use Topics  . Alcohol use: Yes    Alcohol/week: 61.0 standard drinks of alcohol    Types: 21 Cans of beer, [name]; 05/27/22 approx 2-3 beers week for the last 2 months  . Drug use: No    ALLERGIES:  is allergic to inapsine [droperidol] and zofran [ondansetron].  MEDICATIONS:  Current Outpatient Medications  Medication Sig Dispense Refill  . Multiple Vitamin (MULTIVITAMIN WITH MINERALS) TABS tablet Take 1 tablet by mouth daily.    . pantoprazole (PROTONIX) 40 MG tablet Take 1 tablet (40 mg total) by mouth daily. 30 tablet 0  . promethazine (PHENERGAN) 12.5 MG tablet Take 1 tablet (12.5 mg total) by mouth every 6 (six) hours as needed for nausea or vomiting. 30 tablet 0   No current facility-administered medications for this visit.    PHYSICAL EXAMINATION: ECOG PERFORMANCE STATUS: 0 - Asymptomatic  BP (!) 130/102 (BP Location: Right Arm, Patient Position: Sitting) Comment: reading after 2nd attempt. Pt denies being prescribed any BP.  Pulse (!) 106   Temp (!) 97.2 F (36.2 C) (Tympanic)   Wt 113 lb (51.3 kg)   SpO2 100%   BMI 21.35 kg/m   Filed Weights   08/02/23 1350  Weight: 113 lb (51.3 kg)     Physical Exam HENT:     Head: Normocephalic and atraumatic.     Mouth/Throat:     Pharynx: No oropharyngeal exudate.  Eyes:     Pupils: Pupils  are equal, round, and reactive to light.  Cardiovascular:     Rate and Rhythm: Normal rate and regular rhythm.  Pulmonary:     Effort: No respiratory distress.     Breath sounds: No wheezing.  Abdominal:     General: Bowel sounds are normal. There is no distension.     Palpations: Abdomen is soft. There is no mass.     Tenderness: There is no abdominal tenderness. There is no guarding or rebound.  Musculoskeletal:        General: No tenderness. Normal range of motion.     Cervical back: Normal range of motion and neck supple.  Skin:    General: Skin is warm.  Neurological:     Mental Status: She is alert and oriented to person, place, and time.  Psychiatric:        Mood and Affect: Affect normal.     LABORATORY DATA:  I have reviewed the data as listed    Component Value Date/Time   NA 134 (L) 07/29/2023 1317   NA  141 02/05/2021 1334   K 3.7 07/29/2023 1317   CL 102 07/29/2023 1317   CO2 19 (L) 07/29/2023 1317   GLUCOSE 88 07/29/2023 1317   BUN 7 07/29/2023 1317   BUN 7 02/05/2021 1334   CREATININE 0.51 07/29/2023 1317   CALCIUM 8.8 (L) 07/29/2023 1317   PROT 8.1 07/29/2023 1317   PROT 8.0 02/05/2021 1334   ALBUMIN 4.4 07/29/2023 1317   ALBUMIN 4.9 (H) 02/05/2021 1334   AST 56 (H) 07/29/2023 1317   ALT 21 07/29/2023 1317   ALKPHOS 64 07/29/2023 1317   BILITOT 0.5 07/29/2023 1317   GFRNONAA >60 07/29/2023 1317   GFRAA >60 11/15/2019 2054    No results found for: "SPEP", "UPEP"  Lab Results  Component Value Date   WBC 2.6 (L) 07/29/2023   NEUTROABS 1.1 (L) 07/29/2023   HGB 11.1 (L) 07/29/2023   HCT 33.7 (L) 07/29/2023   MCV 82.6 07/29/2023   PLT 170 07/29/2023      Chemistry      Component Value Date/Time   NA 134 (L) 07/29/2023 1317   NA 141 02/05/2021 1334   K 3.7 07/29/2023 1317   CL 102 07/29/2023 1317   CO2 19 (L) 07/29/2023 1317   BUN 7 07/29/2023 1317   BUN 7 02/05/2021 1334   CREATININE 0.51 07/29/2023 1317   GLU 76 01/01/2020 1447       Component Value Date/Time   CALCIUM 8.8 (L) 07/29/2023 1317   ALKPHOS 64 07/29/2023 1317   AST 56 (H) 07/29/2023 1317   ALT 21 07/29/2023 1317   BILITOT 0.5 07/29/2023 1317       RADIOGRAPHIC STUDIES: I have personally reviewed the radiological images as listed and agreed with the findings in the report. No results found.   ASSESSMENT & PLAN:   # Intermittent neutropenia white count  2-3 ANC [phone]; today absolute neutrophil count is  1.7  hemoglobin 10.2- platelets 183 Asymptomatic/benign ethnic neutropenia/? Alcohol. Previous bone marrow biopsy negative.   # Mild Anemia- Hb-10.2 on Depot [menstrual cycles- none]- JULY 2024- Iron sat 10%; Ferritin -11-  [Hx of constipation; difficulty swallowing]- will send liquid Iron script. Recommend IV venofer, but per pt pref- HOLD venofer today. Pt will call to re-schedule. EGD jan 2023- hospital- NED. Refer to [facility]- GI re: anemia   # hx of SVT- ? Sec to electrolyte abnormalites [s/p holter]-follow-up with cardiology.   # Heavy alcohol-again discussed cessation patient not interested.[place] July 2024- fatty liver.    # DISPOSITION: # Refer to [facility]- GI re: anemia; colonoscopy # HOLD venofer today [pt pref; will call to re-schedule] # follow up in 6  months- MD;2-3 days prior  -labs-cbc/cmp/ldh;folic acid; b12  iron studies;ferritin; possible venofer--Dr.B   No problem-specific Assessment & Plan notes found for this encounter.    Alinda Dooms, NP 08/02/2023 2:13 PM

## 2023-08-12 ENCOUNTER — Inpatient Hospital Stay: Payer: Medicaid Other

## 2023-08-12 VITALS — BP 122/93 | HR 82 | Temp 97.3°F | Resp 18

## 2023-08-12 DIAGNOSIS — E611 Iron deficiency: Secondary | ICD-10-CM

## 2023-08-12 MED ORDER — IRON SUCROSE 20 MG/ML IV SOLN
200.0000 mg | Freq: Once | INTRAVENOUS | Status: DC
  Filled 2023-08-12: qty 10

## 2023-08-12 NOTE — Progress Notes (Signed)
Unable to obtain IV access this encounter, access gained and blew x3, pt educated on hydrating x2 days prior and day of appt. Pt reports they usually have to use "ultrasound" to gain access. New additional appt requested.

## 2023-08-16 ENCOUNTER — Inpatient Hospital Stay: Payer: Medicaid Other

## 2023-08-17 ENCOUNTER — Inpatient Hospital Stay: Payer: Medicaid Other | Attending: Nurse Practitioner

## 2023-08-17 DIAGNOSIS — D509 Iron deficiency anemia, unspecified: Secondary | ICD-10-CM | POA: Insufficient documentation

## 2023-08-17 NOTE — Progress Notes (Signed)
Unable to obtain iv access. Multiple attempts made. MD team aware. Pt will reschedule appt.

## 2023-08-19 ENCOUNTER — Inpatient Hospital Stay: Payer: Medicaid Other

## 2023-08-19 VITALS — BP 117/88 | HR 85 | Temp 98.1°F | Resp 16

## 2023-08-19 DIAGNOSIS — D509 Iron deficiency anemia, unspecified: Secondary | ICD-10-CM | POA: Diagnosis present

## 2023-08-19 DIAGNOSIS — E611 Iron deficiency: Secondary | ICD-10-CM

## 2023-08-19 MED ORDER — IRON SUCROSE 20 MG/ML IV SOLN
200.0000 mg | Freq: Once | INTRAVENOUS | Status: AC
  Administered 2023-08-19: 200 mg via INTRAVENOUS

## 2023-08-19 MED ORDER — SODIUM CHLORIDE 0.9% FLUSH
10.0000 mL | Freq: Once | INTRAVENOUS | Status: AC | PRN
  Administered 2023-08-19: 10 mL
  Filled 2023-08-19: qty 10

## 2023-08-22 ENCOUNTER — Encounter: Payer: Self-pay | Admitting: Internal Medicine

## 2023-08-26 ENCOUNTER — Inpatient Hospital Stay: Payer: Medicaid Other

## 2023-08-26 VITALS — BP 108/82 | HR 82 | Temp 98.4°F | Resp 16

## 2023-08-26 DIAGNOSIS — D509 Iron deficiency anemia, unspecified: Secondary | ICD-10-CM | POA: Diagnosis not present

## 2023-08-26 DIAGNOSIS — E611 Iron deficiency: Secondary | ICD-10-CM

## 2023-08-26 MED ORDER — IRON SUCROSE 20 MG/ML IV SOLN
200.0000 mg | Freq: Once | INTRAVENOUS | Status: AC
  Administered 2023-08-26: 200 mg via INTRAVENOUS
  Filled 2023-08-26: qty 10

## 2023-08-26 NOTE — Patient Instructions (Signed)

## 2023-08-31 ENCOUNTER — Inpatient Hospital Stay: Payer: Medicaid Other

## 2023-09-07 ENCOUNTER — Inpatient Hospital Stay: Payer: Medicaid Other

## 2023-09-09 ENCOUNTER — Inpatient Hospital Stay: Payer: Medicaid Other

## 2023-09-09 VITALS — BP 114/84 | HR 65 | Temp 97.5°F | Resp 17

## 2023-09-09 DIAGNOSIS — E611 Iron deficiency: Secondary | ICD-10-CM

## 2023-09-09 DIAGNOSIS — D509 Iron deficiency anemia, unspecified: Secondary | ICD-10-CM | POA: Diagnosis not present

## 2023-09-09 MED ORDER — IRON SUCROSE 20 MG/ML IV SOLN
200.0000 mg | Freq: Once | INTRAVENOUS | Status: AC
  Administered 2023-09-09: 200 mg via INTRAVENOUS

## 2023-09-09 MED ORDER — SODIUM CHLORIDE 0.9% FLUSH
10.0000 mL | Freq: Once | INTRAVENOUS | Status: AC | PRN
Start: 2023-09-09 — End: 2023-09-09
  Administered 2023-09-09: 10 mL
  Filled 2023-09-09: qty 10

## 2023-09-14 ENCOUNTER — Inpatient Hospital Stay: Payer: Medicaid Other

## 2023-09-16 ENCOUNTER — Inpatient Hospital Stay: Attending: Nurse Practitioner

## 2024-01-20 ENCOUNTER — Other Ambulatory Visit: Payer: Self-pay | Admitting: *Deleted

## 2024-01-20 DIAGNOSIS — E611 Iron deficiency: Secondary | ICD-10-CM

## 2024-01-23 ENCOUNTER — Inpatient Hospital Stay: Payer: Medicaid Other

## 2024-01-27 ENCOUNTER — Inpatient Hospital Stay: Attending: Internal Medicine

## 2024-01-27 DIAGNOSIS — D709 Neutropenia, unspecified: Secondary | ICD-10-CM | POA: Insufficient documentation

## 2024-01-27 DIAGNOSIS — D509 Iron deficiency anemia, unspecified: Secondary | ICD-10-CM | POA: Insufficient documentation

## 2024-01-27 DIAGNOSIS — E611 Iron deficiency: Secondary | ICD-10-CM

## 2024-01-27 LAB — CBC WITH DIFFERENTIAL (CANCER CENTER ONLY)
Abs Immature Granulocytes: 0 K/uL (ref 0.00–0.07)
Basophils Absolute: 0 K/uL (ref 0.0–0.1)
Basophils Relative: 1 %
Eosinophils Absolute: 0.1 K/uL (ref 0.0–0.5)
Eosinophils Relative: 8 %
HCT: 36.8 % (ref 36.0–46.0)
Hemoglobin: 12.5 g/dL (ref 12.0–15.0)
Immature Granulocytes: 0 %
Lymphocytes Relative: 43 %
Lymphs Abs: 0.7 K/uL (ref 0.7–4.0)
MCH: 31.2 pg (ref 26.0–34.0)
MCHC: 34 g/dL (ref 30.0–36.0)
MCV: 91.8 fL (ref 80.0–100.0)
Monocytes Absolute: 0.4 K/uL (ref 0.1–1.0)
Monocytes Relative: 26 %
Neutro Abs: 0.4 K/uL — CL (ref 1.7–7.7)
Neutrophils Relative %: 22 %
Platelet Count: 114 K/uL — ABNORMAL LOW (ref 150–400)
RBC: 4.01 MIL/uL (ref 3.87–5.11)
RDW: 12.7 % (ref 11.5–15.5)
Smear Review: NORMAL
WBC Count: 1.6 K/uL — ABNORMAL LOW (ref 4.0–10.5)
nRBC: 0 % (ref 0.0–0.2)

## 2024-01-27 LAB — IRON AND TIBC
Iron: 116 ug/dL (ref 28–170)
Saturation Ratios: 40 % — ABNORMAL HIGH (ref 10.4–31.8)
TIBC: 288 ug/dL (ref 250–450)
UIBC: 172 ug/dL

## 2024-01-27 LAB — VITAMIN B12: Vitamin B-12: 388 pg/mL (ref 180–914)

## 2024-01-27 LAB — FOLATE: Folate: 8.1 ng/mL (ref >5.9)

## 2024-01-27 LAB — FERRITIN: Ferritin: 355 ng/mL — ABNORMAL HIGH (ref 11–307)

## 2024-01-30 ENCOUNTER — Ambulatory Visit: Payer: Self-pay | Admitting: Internal Medicine

## 2024-01-30 ENCOUNTER — Ambulatory Visit: Payer: Medicaid Other

## 2024-01-30 ENCOUNTER — Inpatient Hospital Stay: Payer: Medicaid Other | Admitting: Internal Medicine

## 2024-01-30 NOTE — Telephone Encounter (Signed)
 Pt now show for appt today-  Please have the patient follow-up with APP in the next-2 to 3 weeks- NO labs GB

## 2024-01-30 NOTE — Progress Notes (Signed)
 She rescheduled to 7/28 already this morning

## 2024-02-06 ENCOUNTER — Inpatient Hospital Stay

## 2024-02-06 ENCOUNTER — Inpatient Hospital Stay: Admitting: Internal Medicine

## 2024-03-01 ENCOUNTER — Inpatient Hospital Stay: Attending: Internal Medicine

## 2024-03-06 ENCOUNTER — Inpatient Hospital Stay: Attending: Internal Medicine | Admitting: Internal Medicine

## 2024-03-06 ENCOUNTER — Encounter: Payer: Self-pay | Admitting: Internal Medicine

## 2024-03-06 ENCOUNTER — Inpatient Hospital Stay

## 2024-03-06 VITALS — BP 128/89 | HR 70 | Temp 98.3°F | Resp 16 | Ht 61.0 in | Wt 110.9 lb

## 2024-03-06 DIAGNOSIS — D709 Neutropenia, unspecified: Secondary | ICD-10-CM | POA: Insufficient documentation

## 2024-03-06 DIAGNOSIS — D708 Other neutropenia: Secondary | ICD-10-CM

## 2024-03-06 DIAGNOSIS — D509 Iron deficiency anemia, unspecified: Secondary | ICD-10-CM | POA: Diagnosis present

## 2024-03-06 NOTE — Progress Notes (Signed)
 [facility] OFFICE PROGRESS NOTE  Patient Care Team: Yvette Geralds, NP as PCP - General (Nurse Practitioner) Yvette Deatrice LABOR, MD as PCP - Cardiology (Cardiology) Yvette Cindy SAUNDERS, [name]; [name]; 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; history of alcoholism is here for follow-up.  Patient admits to ongoing fatigue.  Denies any fevers or chills.  Has any cough.  Denies any nausea vomiting.  Patient states that she has cut down on alcohol currently drinking beer a day at most.  She also smokes.   No weight loss or night sweats.   She does complain of hot flashes-she is on Lupron. Menstrual cycles- none at this time.   Review of Systems  Constitutional:  Negative for chills, diaphoresis, fever, [name], hemoptysis, sputum production, [name], palpitations, orthopnea and leg swelling.  Gastrointestinal:  Negative for abdominal pain, blood in stool, constipation, diarrhea, heartburn, melena, nausea and vomiting.  Genitourinary:  Negative for dysuria, frequency and urgency.  Musculoskeletal:  Negative for back pain and joint  pain.  Skin: Negative.  Negative for itching and rash.  Neurological:  Negative for dizziness, tingling, focal weakness, weakness and headaches.  Endo/Heme/Allergies:  Does not bruise/bleed easily.  Psychiatric/Behavioral:  Negative for depression. The patient is not nervous/anxious and does not have insomnia.     PAST MEDICAL HISTORY :  Past Medical History:  Diagnosis Date   Asthma    Cardiac arrest (HCC)    ETOH abuse    H/O Kawasaki's disease    as 34 month old child and at 29 years   History of anemia    History of blood transfusion 1984   Hypokalemia    Hypomagnesemia    a. in the setting of n/v->gastritis/etoh.   Leukopenia    Neutropenia (HCC)    NICM (nonischemic cardiomyopathy) (HCC)    a. 06/2021 Echo: EF 50-55%, [name]; b. 09/2021 ETT: Ex time 6:19, [name], [name]; c. 03/2022 Echo: EF 35-40%, [name]; d. 08/2022 Echo: EF 60-65%, no rwma, [name].   NSVT (nonsustained ventricular tachycardia) (HCC)    a. Noted during admission 06/2021 assoc w/ palpitations and presyncope in the setting of profound QT prolongation/gastritis.   Tobacco abuse    Transaminitis     PAST SURGICAL HISTORY :   Past Surgical History:  Procedure Laterality Date   ESOPHAGOGASTRODUODENOSCOPY N/A 10/29/2020   Procedure: ESOPHAGOGASTRODUODENOSCOPY (EGD);  Surgeon: Yvette Bi, MD;  [name];  Service: Gastroenterology;  [name];   ESOPHAGOGASTRODUODENOSCOPY (EGD) WITH PROPOFOL  N/A 03/14/2020   Procedure: ESOPHAGOGASTRODUODENOSCOPY (EGD) WITH PROPOFOL ;  Surgeon: Yvette Keene NOVAK, MD;  [name];  Service: Endoscopy;  [name];  ESOPHAGOGASTRODUODENOSCOPY (EGD) WITH PROPOFOL  N/A 02/25/2021   Procedure: ESOPHAGOGASTRODUODENOSCOPY (EGD) WITH PROPOFOL ;  Surgeon: Yvette Keene NOVAK, MD;  [name];  Service: Endoscopy;  [name];   ESOPHAGOGASTRODUODENOSCOPY (EGD) WITH PROPOFOL  N/A 06/25/2022    Procedure: ESOPHAGOGASTRODUODENOSCOPY (EGD) WITH PROPOFOL ;  Surgeon: Yvette Elspeth Sharper, DO;  [name];  Service: Gastroenterology;  [name];    FAMILY HISTORY :   Family History  Problem Relation Age of Onset   Lupus Mother    Prostate cancer Father    Cancer Paternal Aunt        Breast    Cancer Paternal Aunt        Breast     SOCIAL HISTORY:   Social History   Tobacco Use   Smoking status: Every Day    Current packs/day: 0.25    Average packs/day: 0.3 packs/day for 10.0 years (2.5 ttl pk-yrs)    Types: Cigarettes   Smokeless tobacco: Never   Tobacco comments:    05/27/22 3 cig per day   Vaping Use   Vaping status: Never Used  Substance Use Topics   Alcohol use: Yes    Alcohol/week: 61.0 standard drinks of alcohol    Types: 21 Cans of beer, [name]; 05/27/22 approx 2-3 beers week for the last 2 months   Drug use: No    ALLERGIES:  is allergic to inapsine  [droperidol ] and zofran  [ondansetron ].  MEDICATIONS:  Current Outpatient Medications  Medication Sig Dispense Refill   pantoprazole  (PROTONIX ) 40 MG tablet Take 1 tablet (40 mg total) by mouth daily. 30 tablet 0   promethazine  (PHENERGAN ) 12.5 MG tablet Take 1 tablet (12.5 mg total) by mouth every 6 (six) hours as needed for nausea or vomiting. 30 tablet 0   No current facility-administered medications for this visit.   Facility-Administered Medications Ordered in Other Visits  Medication Dose Route Frequency Provider Last Rate Last Admin   iron  sucrose (VENOFER ) injection 200 mg  200 mg Intravenous Once Yvette Sipe R, MD       sodium chloride  flush (NS) 0.9 % injection 10 mL  10 mL Intracatheter Once PRN Yvette Lahmann R, [name], Patient Position: Sitting, Cuff Size: Small)   Pulse 70   Temp 98.3 F (36.8 C)  (Tympanic)   Resp 16   Ht 5' 1 (1.549 m)   Wt 110 lb 14.4 oz (50.3 kg)   SpO2 100%   BMI 20.95 kg/m   Filed Weights   03/06/24 1450  Weight: 110 lb 14.4 oz (50.3 kg)     Physical Exam HENT:     Head: Normocephalic and atraumatic.     Mouth/Throat:     Pharynx: No oropharyngeal exudate.  Eyes:     Pupils: Pupils are equal, round, and reactive to light.  Cardiovascular:     Rate and Rhythm: Normal rate and regular rhythm.  Pulmonary:     Effort: No respiratory distress.     Breath sounds: No wheezing.  Abdominal:     General: Bowel sounds are normal. There is no distension.     Palpations: Abdomen is soft. There is no mass.     Tenderness: There is no abdominal tenderness. There is no guarding or rebound.  Musculoskeletal:        General: No tenderness. Normal range of motion.  Cervical back: Normal range of motion and neck supple.  Skin:    General: Skin is warm.  Neurological:     Mental Status: She is alert and oriented to person, place, and time.  Psychiatric:        Mood and Affect: Affect normal.      LABORATORY DATA:  I have reviewed the data as listed    Component Value Date/Time   NA 134 (L) 07/29/2023 1317   NA 141 02/05/2021 1334   K 3.7 07/29/2023 1317   CL 102 07/29/2023 1317   CO2 19 (L) 07/29/2023 1317   GLUCOSE 88 07/29/2023 1317   BUN 7 07/29/2023 1317   BUN 7 02/05/2021 1334   CREATININE 0.51 07/29/2023 1317   CALCIUM  8.8 (L) 07/29/2023 1317   PROT 8.1 07/29/2023 1317   PROT 8.0 02/05/2021 1334   ALBUMIN 4.4 07/29/2023 1317   ALBUMIN 4.9 (H) 02/05/2021 1334   AST 56 (H) 07/29/2023 1317   ALT 21 07/29/2023 1317   ALKPHOS 64 07/29/2023 1317   BILITOT 0.5 07/29/2023 1317   GFRNONAA >60 07/29/2023 1317   GFRAA >60 11/15/2019 2054    No results found for: SPEP, UPEP  Lab Results  Component Value Date   WBC 1.6 (L) 01/27/2024   NEUTROABS 0.4 (LL) 01/27/2024   HGB 12.5 01/27/2024   HCT 36.8 01/27/2024   MCV 91.8 01/27/2024    PLT 114 (L) 01/27/2024      Chemistry      Component Value Date/Time   NA 134 (L) 07/29/2023 1317   NA 141 02/05/2021 1334   K 3.7 07/29/2023 1317   CL 102 07/29/2023 1317   CO2 19 (L) 07/29/2023 1317   BUN 7 07/29/2023 1317   BUN 7 02/05/2021 1334   CREATININE 0.51 07/29/2023 1317   GLU 76 01/01/2020 1447      Component Value Date/Time   CALCIUM  8.8 (L) 07/29/2023 1317   ALKPHOS 64 07/29/2023 1317   AST 56 (H) 07/29/2023 1317   ALT 21 07/29/2023 1317   BILITOT 0.5 07/29/2023 1317       RADIOGRAPHIC STUDIES: I have personally reviewed the radiological images as listed and agreed with the findings in the report. No results found.   ASSESSMENT & PLAN:  Other neutropenia (HCC) # Intermittent neutropenia white count  2-3 ANC [phone]; today absolute neutrophil count is 0.4- hemoglobin 12 - platelets 117 Asymptomatic/benign ethnic neutropenia/? Alcohol.  2017-  bone marrow biopsy negative. Monitor for now. No infection noted.  Given the asymptomatic nature continue surveillance.  # Mild Anemia-  Depot [menstrual cycles- none]- Hx of mild IDA-  [Hx of constipation; difficulty swallowing]- cannot tolerate pills; insurance- no- liquid iron - EGD jan 2023- hopital- NED.   # hx of SVT- ? Sec to electrolyte abnormalites [s/p holter]-follow-up with cardiology.  # DISPOSITION: # HOLD venofer  today - # follow up in 6  months- MD;2-3 days prior  -labs-cbc/cmp/ldh;folic acid ; b12  iron  studies;ferritin; possible venofer --Dr.B  Cc; Risk manager.       Cindy JONELLE Joe, MD 03/06/2024 3:25 PM

## 2024-03-06 NOTE — Progress Notes (Signed)
 Fatigue/weakness: YES  Dyspena: NO Light headedness: NO [name]: NO   C/O cramps and knots in her calves, on and on x6 months.  If possible, pt doesn't want to get the infusion today due to needing to meet her landlord about rent.

## 2024-03-06 NOTE — Assessment & Plan Note (Addendum)
#   Intermittent neutropenia white count  2-3 ANC [phone]; today absolute neutrophil count is 0.4- hemoglobin 12 - platelets 117 Asymptomatic/benign ethnic neutropenia/? Alcohol.  2017-  bone marrow biopsy negative. Monitor for now. No infection noted.  Given the asymptomatic nature continue surveillance.  # Mild Anemia-  Depot [menstrual cycles- none]- Hx of mild IDA-  [Hx of constipation; difficulty swallowing]- cannot tolerate pills; insurance- no- liquid iron - EGD jan 2023- hopital- NED.   # hx of SVT- ? Sec to electrolyte abnormalites [s/p holter]-follow-up with cardiology.  # DISPOSITION: # HOLD venofer  today - # follow up in 6  months- MD;2-3 days prior  -labs-cbc/cmp/ldh;folic acid ; b12  iron  studies;ferritin; possible venofer --Dr.B  Cc; Risk manager.

## 2024-06-11 ENCOUNTER — Other Ambulatory Visit: Payer: Self-pay

## 2024-06-11 DIAGNOSIS — Z1231 Encounter for screening mammogram for malignant neoplasm of breast: Secondary | ICD-10-CM

## 2024-06-30 ENCOUNTER — Inpatient Hospital Stay
Admission: EM | Admit: 2024-06-30 | Discharge: 2024-07-03 | DRG: 897 | Disposition: A | Discharge status: Home / Self Care

## 2024-06-30 ENCOUNTER — Emergency Department

## 2024-06-30 DIAGNOSIS — E8889 Other specified metabolic disorders: Secondary | ICD-10-CM | POA: Insufficient documentation

## 2024-06-30 DIAGNOSIS — F10239 Alcohol dependence with withdrawal, unspecified: Principal | ICD-10-CM | POA: Diagnosis present

## 2024-06-30 DIAGNOSIS — K2920 Alcoholic gastritis without bleeding: Secondary | ICD-10-CM | POA: Diagnosis present

## 2024-06-30 DIAGNOSIS — K709 Alcoholic liver disease, unspecified: Secondary | ICD-10-CM | POA: Diagnosis present

## 2024-06-30 DIAGNOSIS — R7401 Elevation of levels of liver transaminase levels: Secondary | ICD-10-CM | POA: Diagnosis present

## 2024-06-30 DIAGNOSIS — D72810 Lymphocytopenia: Secondary | ICD-10-CM | POA: Diagnosis present

## 2024-06-30 DIAGNOSIS — F419 Anxiety disorder, unspecified: Secondary | ICD-10-CM | POA: Diagnosis present

## 2024-06-30 DIAGNOSIS — I493 Ventricular premature depolarization: Secondary | ICD-10-CM | POA: Diagnosis present

## 2024-06-30 DIAGNOSIS — D509 Iron deficiency anemia, unspecified: Secondary | ICD-10-CM | POA: Diagnosis present

## 2024-06-30 DIAGNOSIS — R9431 Abnormal electrocardiogram [ECG] [EKG]: Secondary | ICD-10-CM | POA: Diagnosis present

## 2024-06-30 DIAGNOSIS — Z8042 Family history of malignant neoplasm of prostate: Secondary | ICD-10-CM

## 2024-06-30 DIAGNOSIS — E876 Hypokalemia: Secondary | ICD-10-CM | POA: Diagnosis present

## 2024-06-30 DIAGNOSIS — E8729 Other acidosis: Secondary | ICD-10-CM | POA: Diagnosis present

## 2024-06-30 DIAGNOSIS — R1110 Vomiting, unspecified: Principal | ICD-10-CM | POA: Diagnosis present

## 2024-06-30 DIAGNOSIS — Z8269 Family history of other diseases of the musculoskeletal system and connective tissue: Secondary | ICD-10-CM

## 2024-06-30 DIAGNOSIS — I426 Alcoholic cardiomyopathy: Secondary | ICD-10-CM | POA: Diagnosis present

## 2024-06-30 DIAGNOSIS — Z8674 Personal history of sudden cardiac arrest: Secondary | ICD-10-CM

## 2024-06-30 DIAGNOSIS — D709 Neutropenia, unspecified: Secondary | ICD-10-CM | POA: Diagnosis present

## 2024-06-30 DIAGNOSIS — I471 Supraventricular tachycardia, unspecified: Secondary | ICD-10-CM | POA: Diagnosis present

## 2024-06-30 DIAGNOSIS — F17210 Nicotine dependence, cigarettes, uncomplicated: Secondary | ICD-10-CM | POA: Diagnosis present

## 2024-06-30 DIAGNOSIS — R748 Abnormal levels of other serum enzymes: Secondary | ICD-10-CM

## 2024-06-30 DIAGNOSIS — K760 Fatty (change of) liver, not elsewhere classified: Secondary | ICD-10-CM | POA: Diagnosis present

## 2024-06-30 DIAGNOSIS — F10939 Alcohol use, unspecified with withdrawal, unspecified: Secondary | ICD-10-CM | POA: Diagnosis present

## 2024-06-30 DIAGNOSIS — J45909 Unspecified asthma, uncomplicated: Secondary | ICD-10-CM | POA: Diagnosis present

## 2024-06-30 DIAGNOSIS — Z8616 Personal history of COVID-19: Secondary | ICD-10-CM

## 2024-06-30 DIAGNOSIS — I5042 Chronic combined systolic (congestive) and diastolic (congestive) heart failure: Secondary | ICD-10-CM | POA: Diagnosis present

## 2024-06-30 DIAGNOSIS — D6959 Other secondary thrombocytopenia: Secondary | ICD-10-CM | POA: Diagnosis present

## 2024-06-30 LAB — URINALYSIS, ROUTINE W REFLEX MICROSCOPIC
Bilirubin Urine: NEGATIVE
Glucose, UA: NEGATIVE mg/dL
Ketones, ur: 20 mg/dL — AB
Leukocytes,Ua: NEGATIVE
Nitrite: NEGATIVE
Protein, ur: 100 mg/dL — AB
Specific Gravity, Urine: 1.024 (ref 1.005–1.030)
pH: 5 (ref 5.0–8.0)

## 2024-06-30 LAB — COMPREHENSIVE METABOLIC PANEL WITH GFR
ALT: 142 U/L — ABNORMAL HIGH (ref 0–44)
AST: 526 U/L — ABNORMAL HIGH (ref 15–41)
Albumin: 5.3 g/dL — ABNORMAL HIGH (ref 3.5–5.0)
Alkaline Phosphatase: 143 U/L — ABNORMAL HIGH (ref 38–126)
Anion gap: 21 — ABNORMAL HIGH (ref 5–15)
BUN: 5 mg/dL — ABNORMAL LOW (ref 6–20)
CO2: 19 mmol/L — ABNORMAL LOW (ref 22–32)
Calcium: 9.8 mg/dL (ref 8.9–10.3)
Chloride: 100 mmol/L (ref 98–111)
Creatinine, Ser: 0.58 mg/dL (ref 0.44–1.00)
GFR, Estimated: >60 mL/min
Glucose, Bld: 85 mg/dL (ref 70–99)
Potassium: 3.5 mmol/L (ref 3.5–5.1)
Sodium: 140 mmol/L (ref 135–145)
Total Bilirubin: 0.9 mg/dL (ref 0.0–1.2)
Total Protein: 8.9 g/dL — ABNORMAL HIGH (ref 6.5–8.1)

## 2024-06-30 LAB — CBC
HCT: 40.9 % (ref 36.0–46.0)
Hemoglobin: 14.1 g/dL (ref 12.0–15.0)
MCH: 30.9 pg (ref 26.0–34.0)
MCHC: 34.5 g/dL (ref 30.0–36.0)
MCV: 89.7 fL (ref 80.0–100.0)
Platelets: 103 K/uL — ABNORMAL LOW (ref 150–400)
RBC: 4.56 MIL/uL (ref 3.87–5.11)
RDW: 13.7 % (ref 11.5–15.5)
WBC: 2.2 K/uL — ABNORMAL LOW (ref 4.0–10.5)
nRBC: 0 % (ref 0.0–0.2)

## 2024-06-30 LAB — LIPASE, BLOOD: Lipase: 21 U/L (ref 11–51)

## 2024-06-30 LAB — POC URINE PREG, ED: Preg Test, Ur: NEGATIVE

## 2024-06-30 MED ORDER — LORAZEPAM 2 MG/ML IJ SOLN: 1.0000 mg | INTRAMUSCULAR | Status: DC | PRN

## 2024-06-30 MED ORDER — LACTATED RINGERS IV SOLN: INTRAVENOUS | Status: DC

## 2024-06-30 MED ORDER — ADULT MULTIVITAMIN W/MINERALS CH
1.0000 | ORAL_TABLET | Freq: Every day | ORAL | Status: DC
  Administered 2024-07-01 – 2024-07-03 (×3): 1 via ORAL
  Filled 2024-06-30 (×3): qty 1

## 2024-06-30 MED ORDER — THIAMINE HCL 100 MG/ML IJ SOLN: 100.0000 mg | Freq: Every day | INTRAMUSCULAR | Status: DC

## 2024-06-30 MED ORDER — POTASSIUM CHLORIDE 10 MEQ/100ML IV SOLN
10.0000 meq | INTRAVENOUS | Status: AC
  Administered 2024-06-30 – 2024-07-01 (×2): 10 meq via INTRAVENOUS
  Filled 2024-06-30 (×2): qty 100

## 2024-06-30 MED ORDER — ACETAMINOPHEN 325 MG PO TABS: 650.0000 mg | ORAL_TABLET | Freq: Four times a day (QID) | ORAL | Status: DC | PRN

## 2024-06-30 MED ORDER — PANTOPRAZOLE SODIUM 40 MG IV SOLR
40.0000 mg | Freq: Once | INTRAVENOUS | Status: AC
  Administered 2024-06-30: 40 mg via INTRAVENOUS
  Filled 2024-06-30: qty 10

## 2024-06-30 MED ORDER — LORAZEPAM 1 MG PO TABS
1.0000 mg | ORAL_TABLET | ORAL | Status: DC | PRN
  Administered 2024-07-02: 1 mg via ORAL
  Filled 2024-06-30: qty 1

## 2024-06-30 MED ORDER — METOCLOPRAMIDE HCL 5 MG/ML IJ SOLN
10.0000 mg | Freq: Once | INTRAMUSCULAR | Status: AC
  Administered 2024-06-30: 10 mg via INTRAVENOUS
  Filled 2024-06-30: qty 2

## 2024-06-30 MED ORDER — ENOXAPARIN SODIUM 40 MG/0.4ML IJ SOSY
40.0000 mg | PREFILLED_SYRINGE | INTRAMUSCULAR | Status: DC
  Administered 2024-06-30 – 2024-07-02 (×3): 40 mg via SUBCUTANEOUS
  Filled 2024-06-30 (×3): qty 0.4

## 2024-06-30 MED ORDER — DEXAMETHASONE SOD PHOSPHATE PF 10 MG/ML IJ SOLN
12.0000 mg | INTRAMUSCULAR | Status: AC
  Administered 2024-06-30 – 2024-07-02 (×3): 12 mg via INTRAVENOUS

## 2024-06-30 MED ORDER — OLANZAPINE 5 MG PO TABS: 2.5000 mg | ORAL_TABLET | Freq: Every day | ORAL | Status: DC

## 2024-06-30 MED ORDER — LORAZEPAM 2 MG/ML IJ SOLN
0.0000 mg | Freq: Four times a day (QID) | INTRAMUSCULAR | Status: AC
  Administered 2024-07-01: 1 mg via INTRAVENOUS
  Filled 2024-06-30: qty 1

## 2024-06-30 MED ORDER — OLANZAPINE 10 MG IM SOLR
2.5000 mg | Freq: Once | INTRAMUSCULAR | Status: AC
  Administered 2024-06-30: 2.5 mg via INTRAMUSCULAR
  Filled 2024-06-30: qty 10

## 2024-06-30 MED ORDER — LACTATED RINGERS IV BOLUS
1000.0000 mL | Freq: Once | INTRAVENOUS | Status: AC
  Administered 2024-06-30: 1000 mL via INTRAVENOUS

## 2024-06-30 MED ORDER — SODIUM CHLORIDE 0.9 % IV BOLUS
1000.0000 mL | Freq: Once | INTRAVENOUS | Status: AC
  Administered 2024-06-30: 1000 mL via INTRAVENOUS

## 2024-06-30 MED ORDER — THIAMINE MONONITRATE 100 MG PO TABS
100.0000 mg | ORAL_TABLET | Freq: Every day | ORAL | Status: DC
  Administered 2024-07-01 – 2024-07-03 (×3): 100 mg via ORAL
  Filled 2024-06-30 (×3): qty 1

## 2024-06-30 MED ORDER — FOLIC ACID 1 MG PO TABS
1.0000 mg | ORAL_TABLET | Freq: Every day | ORAL | Status: DC
  Administered 2024-07-01 – 2024-07-03 (×3): 1 mg via ORAL
  Filled 2024-06-30 (×3): qty 1

## 2024-06-30 MED ORDER — ACETAMINOPHEN 650 MG RE SUPP: 650.0000 mg | Freq: Four times a day (QID) | RECTAL | Status: DC | PRN

## 2024-06-30 MED ORDER — PROCHLORPERAZINE EDISYLATE 10 MG/2ML IJ SOLN
10.0000 mg | Freq: Once | INTRAMUSCULAR | Status: AC
  Administered 2024-06-30: 10 mg via INTRAVENOUS
  Filled 2024-06-30: qty 2

## 2024-06-30 MED ORDER — PANTOPRAZOLE SODIUM 40 MG IV SOLR
40.0000 mg | Freq: Two times a day (BID) | INTRAVENOUS | Status: DC
  Administered 2024-07-01 – 2024-07-03 (×5): 40 mg via INTRAVENOUS
  Filled 2024-06-30 (×5): qty 10

## 2024-06-30 MED ORDER — LORAZEPAM 2 MG/ML IJ SOLN
0.0000 mg | Freq: Two times a day (BID) | INTRAMUSCULAR | Status: DC
  Administered 2024-07-03: 2 mg via INTRAVENOUS
  Filled 2024-06-30: qty 1

## 2024-06-30 NOTE — ED Triage Notes (Signed)
 Pt arrives via EMS with reports of n/v that worsened today at 1 am. Denies abd pain or sick contacts.

## 2024-06-30 NOTE — H&P (Signed)
 " History and Physical    Patient: Yvette [name] FMW:[id number] DOB: Apr 21, 1981 DOA: 06/30/2024 DOS: the patient was seen and examined on 06/30/2024 PCP: Osa Geralds, NP  Patient coming from: Home  Chief Complaint:  Chief Complaint  Patient presents with   Emesis   Nausea   HPI: 43 year old female with a past medical history of neutropenia, history of SVT secondary to electrolyte disturbance, lung nodules, Kawasaki disease, alcohol abuse, prolonged QTc syndrome, nonischemic cardiomyopathy with recovered EF and history of VF arrest in addition to alcohol associated nausea vomiting gastritis presents to the emergency department with intractable nausea and vomiting since this morning.  The patient denies any headache denies any abdominal pain denies any diarrhea but reports persistent nausea and vomiting and is extremely nervous about any antiemetics will give her given her history of cardiac arrest and prolonged QTc.  She reports minimal to no relief with current therapy so far.  The patient does report she drink alcohol approximately 2 days ago which she reports is a few beers but cannot further quantify. No Cannabis usage.  Past medical records reviewed and summarized Patient's discharge summary from 06/23/2023 with intractable nausea and vomiting, noted to have QTc 450 at that time 08/11/2023:  CSY: 05/16/2023-Dr. Toledo-normal colonoscopy and terminal ileoscopy - EGD: May 16, 2023-Dr. Toledo-hiatal hernia (3 cm) otherwise normal upper endoscopy. 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 in the setting of n/v->gastritis/etoh.   Leukopenia    Neutropenia    NICM (nonischemic cardiomyopathy) (HCC)    a. 06/2021 Echo: EF 50-55%, [name]; b. 09/2021 ETT: Ex time 6:19, [name], [name]; c. 03/2022 Echo: EF 35-40%, [name]; d. 08/2022 Echo: EF 60-65%, no rwma, [name].   NSVT (nonsustained ventricular tachycardia) (HCC)    a. Noted during admission 06/2021 assoc w/ palpitations and presyncope in the setting of profound QT prolongation/gastritis.   Tobacco abuse    Transaminitis    Past Surgical History:  Procedure Laterality Date   ESOPHAGOGASTRODUODENOSCOPY N/A 10/29/2020   Procedure: ESOPHAGOGASTRODUODENOSCOPY (EGD);  [name], MD;  Location: [facility] ENDOSCOPY;  Service: Gastroenterology;  [name];   ESOPHAGOGASTRODUODENOSCOPY (EGD) WITH PROPOFOL  N/A 03/14/2020   Procedure: ESOPHAGOGASTRODUODENOSCOPY (EGD) WITH PROPOFOL ;  [name], MD;  [name];  Service: Endoscopy;  [name];   ESOPHAGOGASTRODUODENOSCOPY (EGD) WITH PROPOFOL  N/A 02/25/2021   Procedure: ESOPHAGOGASTRODUODENOSCOPY (EGD) WITH PROPOFOL ;  [name], MD;  [name];  Service: Endoscopy;  [name];   ESOPHAGOGASTRODUODENOSCOPY (EGD) WITH PROPOFOL  N/A 06/25/2022   Procedure: ESOPHAGOGASTRODUODENOSCOPY (EGD) WITH PROPOFOL ;  [name], DO;  Location[facility] ENDOSCOPY;  Service: Gastroenterology;  [name];   Social History:  reports that she has been smoking cigarettes. She has a 2.5 pack-year smoking history. She has never used smokeless tobacco. She reports current alcohol use of about 61.0 standard drinks of alcohol per week. She reports that she does  not use drugs.  Allergies[1]  Family History  Problem Relation Age of Onset   Lupus Mother    Prostate cancer Father    Cancer  Paternal Aunt        Breast    Cancer Paternal Aunt        Breast     Prior to Admission medications  Medication Sig Start Date End Date Taking? Authorizing Provider  pantoprazole  (PROTONIX ) 40 MG tablet Take 1 tablet (40 mg total) by mouth daily. 05/11/23   Laurita Pillion, MD  promethazine  (PHENERGAN ) 12.5 MG tablet Take 1 tablet (12.5 mg total) by mouth every 6 (six) hours as needed for nausea or vomiting. 06/20/23   Cyrena Mylar, MD    Physical Exam: Vitals:   06/30/24 1810 06/30/24 1820 06/30/24 1825 06/30/24 1945  BP:    (!) 159/97  Pulse: 85 89 84 78  Resp:    (!) 21  Temp:    98.5 F (36.9 C)  TempSrc:    Oral  SpO2: 100% 100% 95% 100%  Weight:      Height:      Patient seen and examined at approximately 10:20 PM in room 47 of the emergency department Patient holding emesis bucket with intermittent vomiting Constitutional:  Vital Signs as per [facility] than three noted] No Acute Distress Eyes:  Pink Conjunctiva and no Ptosis Respiratory:   Respiratory Effort Normal: No Use of Respiratory Muscles,No  Intercostal Retractions             Lungs Clear to Auscultation Bilaterally Cardiovascular:   Heart Auscultated: Regular Regular without any added sounds or murmurs              No Lower Extremity Edema Gastrointestinal:  Abdomen soft and nontender without palpable masses, guarding or rebound  No Palpable Splenomegaly or Hepatomegaly Tremor to outstretched hand but no flap Psychiatric:  Patient Orientated to Time, Place and Person Patient with blunted mood and affect Recent and Remote Memory Intact  Data Reviewed: Labs, Radiology, ECG as detailed in HPI and A/P   Assessment and Plan: * Vomiting  Intractable nausea and vomiting No abdominal pain and normal lipase do not believe there is a intra-abdominal process or acute abdomen, benign exam Pregnancy test was negative, not NK1 canidate given Liver function Suspect this is a symptom of her alcohol withdrawal,  denies any cannabis usage, extremely reluctant on antiemetics given her prolonged QTc, given the patient has no abdominal pain and is on IV PPI do not believe this is a primary gastritis but rather withdrawal symptoms accordingly given the patient's concern about prolonged QTc have prescribed olanzapine  2.5 mg which is lower than the usual QTc prolonging dose dexamethasone  12 mg daily IV and have ordered an ECG which is pending to potentially judiciously use QTc prolonging agents (did receive Compazine  in the emergency department), telemetry has been ordered for this indication as well as the fact she is in alcohol withdrawal at risk for malignant arrhythmias with her VF history, plan is to treat symptomatically and follow clinically   Transaminitis Transaminitis Alkaline phosphatase 143 AST 526 ALT 142 This is not alcoholic hepatitis given the patient's normal bilirubin the ratio however makes me think this is alcohol induced transaminitis Will check an acute hepatitis panel for completeness check an INR Right upper quadrant ultrasound rule out any stones but was notable for hepatic steatosis  Alcohol withdrawal (HCC) Alcohol withdrawal Denies any history of alcohol withdrawal seizures but does have a history of complicated  alcohol withdrawal last drink 2 days ago and now significant nausea and vomiting, WAS protocol with as needed benzodiazepines, thiamine  folic acid  No tactile or visual hallucinations currently  Ketosis (HCC)  Probable starvation ketosis CO2 19 anion gap 21 ketones noted Obtaining lactic acid as well as a VBG   Prolonged Q-T interval on ECG History of prolonged QTc syndrome Obtaining electrolytes, providing potassium given its at 3.5 to drive greater than 4, ECG, telemetry Obtaining ECG (ordered and pending)  Neutropenia 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 Normal cytogenetics No fever or signs or symptoms of localizing infection to think the patient has infection       Advance Care Planning:   Code Status: Full Code as per patient   Consults: Nil  Family Communication: Patient Only  Severity of Illness: The appropriate patient status for this patient is OBSERVATION. Observation status is judged to be reasonable and necessary in order to provide the required intensity of service to ensure the patient's safety. The patient's presenting symptoms, physical exam findings, and initial radiographic and laboratory data in the context of their medical condition is felt to place them at decreased risk for further clinical deterioration. Furthermore, it is anticipated that the patient will be medically stable for discharge from the hospital within 2 midnights of admission.   Author: Prentice JAYSON Lowenstein, MD 06/30/2024 10:53 PM  For on call review [email or web address].      [1]  Allergies Allergen Reactions   Inapsine  [Droperidol ] Anaphylaxis   Zofran  [Ondansetron ] Other (See Comments)    Prolonged QT   "

## 2024-06-30 NOTE — ED Provider Notes (Signed)
 "  [facility] Provider Note    Event Date/Time   First MD Initiated Contact with Patient 06/30/24 1755     (approximate)   History   Emesis and Nausea   HPI  Yvette [name] is a 43 y.o. female history of leukopenia, anemia, EtOH abuse, hypokalemia, cardiac arrest presents emergency department with complaints of nausea and vomiting.  Patient states she has not had EtOH.  Has not been taken Tylenol .  States she has had problems with her liver since 2015.  Still has a gallbladder.  Denying abdominal pain.  No fever or chills.  Just uncontrollable vomiting.  Patient states she is allergic to droperidol , cannot take Zofran  due to the QT prolongation      Physical Exam   Triage Vital Signs: ED Triage Vitals  Encounter Vitals Group     BP 06/30/24 1604 (!) 152/89     Girls Systolic BP Percentile --      Girls Diastolic BP Percentile --      Boys Systolic BP Percentile --      Boys Diastolic BP Percentile --      Pulse Rate 06/30/24 1603 82     Resp 06/30/24 1603 18     Temp 06/30/24 1603 98.6 F (37 C)     Temp Source 06/30/24 1603 Oral     SpO2 06/30/24 1603 98 %     Weight 06/30/24 1603 110 lb 3.7 oz (50 kg)     Height 06/30/24 1603 5' 1 (1.549 m)     Head Circumference --      Peak Flow --      Pain Score 06/30/24 1603 0     Pain Loc --      Pain Education --      Exclude from Growth Chart --     Most recent vital signs: Vitals:   06/30/24 1825 06/30/24 1945  BP:  (!) 159/97  Pulse: 84 78  Resp:  (!) 21  Temp:  98.5 F (36.9 C)  SpO2: 95% 100%     General: Awake, no distress.   CV:  Good peripheral perfusion. regular rate and  rhythm Resp:  Normal effort. Lungs CTA Abd:  No distention.  Nontender, patient constantly retching Other:      ED Results / Procedures / Treatments   Labs (all labs ordered are listed, but only abnormal results are displayed) Labs Reviewed  COMPREHENSIVE METABOLIC PANEL WITH GFR - Abnormal; Notable for  the following components:      Result Value   CO2 19 (*)    BUN 5 (*)    Total Protein 8.9 (*)    Albumin 5.3 (*)    AST 526 (*)    ALT 142 (*)    Alkaline Phosphatase 143 (*)    Anion gap 21 (*)    All other components within normal limits  CBC - Abnormal; Notable for the following components:   WBC 2.2 (*)    Platelets 103 (*)    All other components within normal limits  URINALYSIS, ROUTINE W REFLEX MICROSCOPIC - Abnormal; Notable for the following components:   Color, Urine AMBER (*)    APPearance HAZY (*)    Hgb urine dipstick MODERATE (*)    Ketones, ur 20 (*)    Protein, ur 100 (*)    Bacteria, UA RARE (*)    All other components within normal limits  LIPASE, BLOOD  HEPATITIS PANEL, ACUTE  HIV ANTIBODY (ROUTINE TESTING  W REFLEX)  CBC  CREATININE, SERUM  PROTIME-INR  BLOOD GAS, VENOUS  LACTIC ACID, PLASMA  LACTIC ACID, PLASMA  MAGNESIUM   PHOSPHORUS  POC URINE PREG, ED     EKG     RADIOLOGY Ultrasound right upper quad    PROCEDURES:   Procedures  Critical Care: No Chief Complaint  Patient presents with   Emesis   Nausea      MEDICATIONS ORDERED IN ED: Medications  enoxaparin  (LOVENOX ) injection 40 mg (has no administration in time range)  dexamethasone  (DECADRON ) injection 12 mg (has no administration in time range)  OLANZapine  (ZYPREXA ) tablet 2.5 mg (has no administration in time range)  potassium chloride  10 mEq in 100 mL IVPB (has no administration in time range)  LORazepam  (ATIVAN ) tablet 1-4 mg (has no administration in time range)    Or  LORazepam  (ATIVAN ) injection 1-4 mg (has no administration in time range)  thiamine  (VITAMIN B1) tablet 100 mg (has no administration in time range)    Or  thiamine  (VITAMIN B1) injection 100 mg (has no administration in time range)  folic acid  (FOLVITE ) tablet 1 mg (has no administration in time range)  multivitamin with minerals tablet 1 tablet (has no administration in time range)  LORazepam   (ATIVAN ) injection 0-4 mg (has no administration in time range)    Followed by  LORazepam  (ATIVAN ) injection 0-4 mg (has no administration in time range)  pantoprazole  (PROTONIX ) injection 40 mg (has no administration in time range)  prochlorperazine  (COMPAZINE ) injection 10 mg (10 mg Intravenous Given 06/30/24 1842)  sodium chloride  0.9 % bolus 1,000 mL (0 mLs Intravenous Stopped 06/30/24 1901)  metoCLOPramide  (REGLAN ) injection 10 mg (10 mg Intravenous Given 06/30/24 2119)  lactated ringers  bolus 1,000 mL (1,000 mLs Intravenous New Bag/Given 06/30/24 2125)  pantoprazole  (PROTONIX ) injection 40 mg (40 mg Intravenous Given 06/30/24 2121)     IMPRESSION / MDM / ASSESSMENT AND PLAN / ED COURSE  I reviewed the triage vital signs and the nursing notes.                              Differential diagnosis includes, but is not limited to, cyclic vomiting, alcoholic gastritis, hepatitis, gastroenteritis, acute cholecystitis, choledocholithiasis, pancreatitis  Patient's presentation is most consistent with acute illness / injury with system symptoms.    Medications given: Compazine , normal saline 1 IV, Reglan  IV, Protonix  20 mg IV  CBC reassuring, Metabolic panel shows AST of 526, ALT at 142, alk phosphatase 143, bili Ruben levels are reassuring, her anion gap is slightly elevated at 21 Lipase reassuring  With the patient continuing to vomit went ahead and gave her normal saline 1 L IV, Compazine  10 mg IV will reassess with patient.  Added hepatitis panel.  Will consider admission if the patient continues to have vomiting, elevated liver enzymes can be followed up as outpatient.   Have been unable to control patient's vomiting.  It has calm down some but she still continues to vomit.  I feel with 2 doses of IV antiemetics and no relief that she would benefit from admission.  Patient is in agreement this treatment plan.  Consult hospitalist, Dr. Ezzard will be admitting the patient.  He is aware  of the elevated liver enzymes, history of alcoholic gastritis, and treatment plan that we have done here in the ED.  He agrees to admit the patient.  She is stable at this time.   FINAL CLINICAL IMPRESSION(S) /  ED DIAGNOSES   Final diagnoses:  Intractable vomiting  Elevated liver enzymes     Rx / DC Orders   ED Discharge Orders     None        Note:  This document was prepared using Dragon voice recognition software and may include unintentional dictation errors.    Gasper Devere ORN, PA-C 06/30/24 2244  "

## 2024-06-30 NOTE — Assessment & Plan Note (Signed)
 Transaminitis Alkaline phosphatase 143 AST 526 ALT 142 This is not alcoholic hepatitis given the patient's normal bilirubin the ratio however makes me think this is alcohol induced transaminitis Will check an acute hepatitis panel for completeness check an INR Right upper quadrant ultrasound rule out any stones but was notable for hepatic steatosis

## 2024-06-30 NOTE — Assessment & Plan Note (Signed)
 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 Normal cytogenetics No fever or signs or symptoms of localizing infection to think the patient has infection

## 2024-06-30 NOTE — Assessment & Plan Note (Signed)
" °  Probable starvation ketosis CO2 19 anion gap 21 ketones noted Obtaining lactic acid as well as a VBG  "

## 2024-06-30 NOTE — Assessment & Plan Note (Signed)
 Alcohol withdrawal Denies any history of alcohol withdrawal seizures but does have a history of complicated alcohol withdrawal last drink 2 days ago and now significant nausea and vomiting, WAS protocol with as needed benzodiazepines, thiamine  folic acid  No tactile or visual hallucinations currently

## 2024-06-30 NOTE — Assessment & Plan Note (Signed)
 History of prolonged QTc syndrome Obtaining electrolytes, providing potassium given its at 3.5 to drive greater than 4, ECG, telemetry Obtaining ECG (ordered and pending)

## 2024-06-30 NOTE — Assessment & Plan Note (Addendum)
" °  Intractable nausea and vomiting No abdominal pain and normal lipase do not believe there is a intra-abdominal process or acute abdomen, benign exam Pregnancy test was negative, not NK1 canidate given Liver function Suspect this is a symptom of her alcohol withdrawal, denies any cannabis usage, extremely reluctant on antiemetics given her prolonged QTc, given the patient has no abdominal pain and is on IV PPI do not believe this is a primary gastritis but rather withdrawal symptoms accordingly given the patient's concern about prolonged QTc have prescribed olanzapine  2.5 mg which is lower than the usual QTc prolonging dose dexamethasone  12 mg daily IV and have ordered an ECG which is pending to potentially judiciously use QTc prolonging agents (did receive Compazine  in the emergency department), telemetry has been ordered for this indication as well as the fact she is in alcohol withdrawal at risk for malignant arrhythmias with her VF history, plan is to treat symptomatically and follow clinically  "

## 2024-07-01 DIAGNOSIS — K709 Alcoholic liver disease, unspecified: Secondary | ICD-10-CM | POA: Diagnosis present

## 2024-07-01 DIAGNOSIS — D6959 Other secondary thrombocytopenia: Secondary | ICD-10-CM | POA: Diagnosis present

## 2024-07-01 DIAGNOSIS — E8889 Other specified metabolic disorders: Secondary | ICD-10-CM | POA: Diagnosis not present

## 2024-07-01 DIAGNOSIS — R112 Nausea with vomiting, unspecified: Secondary | ICD-10-CM | POA: Diagnosis not present

## 2024-07-01 DIAGNOSIS — I4581 Long QT syndrome: Secondary | ICD-10-CM | POA: Diagnosis not present

## 2024-07-01 DIAGNOSIS — Z8042 Family history of malignant neoplasm of prostate: Secondary | ICD-10-CM | POA: Diagnosis not present

## 2024-07-01 DIAGNOSIS — I426 Alcoholic cardiomyopathy: Secondary | ICD-10-CM | POA: Diagnosis present

## 2024-07-01 DIAGNOSIS — R9431 Abnormal electrocardiogram [ECG] [EKG]: Secondary | ICD-10-CM

## 2024-07-01 DIAGNOSIS — R1110 Vomiting, unspecified: Secondary | ICD-10-CM | POA: Diagnosis present

## 2024-07-01 DIAGNOSIS — Z8269 Family history of other diseases of the musculoskeletal system and connective tissue: Secondary | ICD-10-CM | POA: Diagnosis not present

## 2024-07-01 DIAGNOSIS — K2920 Alcoholic gastritis without bleeding: Secondary | ICD-10-CM | POA: Diagnosis present

## 2024-07-01 DIAGNOSIS — I509 Heart failure, unspecified: Secondary | ICD-10-CM | POA: Diagnosis not present

## 2024-07-01 DIAGNOSIS — F10929 Alcohol use, unspecified with intoxication, unspecified: Secondary | ICD-10-CM

## 2024-07-01 DIAGNOSIS — Z8616 Personal history of COVID-19: Secondary | ICD-10-CM | POA: Diagnosis not present

## 2024-07-01 DIAGNOSIS — E876 Hypokalemia: Secondary | ICD-10-CM | POA: Diagnosis present

## 2024-07-01 DIAGNOSIS — D72810 Lymphocytopenia: Secondary | ICD-10-CM | POA: Diagnosis present

## 2024-07-01 DIAGNOSIS — F419 Anxiety disorder, unspecified: Secondary | ICD-10-CM | POA: Diagnosis present

## 2024-07-01 DIAGNOSIS — I471 Supraventricular tachycardia, unspecified: Secondary | ICD-10-CM | POA: Diagnosis present

## 2024-07-01 DIAGNOSIS — I5022 Chronic systolic (congestive) heart failure: Secondary | ICD-10-CM | POA: Diagnosis not present

## 2024-07-01 DIAGNOSIS — I493 Ventricular premature depolarization: Secondary | ICD-10-CM | POA: Diagnosis present

## 2024-07-01 DIAGNOSIS — K760 Fatty (change of) liver, not elsewhere classified: Secondary | ICD-10-CM | POA: Diagnosis present

## 2024-07-01 DIAGNOSIS — E8729 Other acidosis: Secondary | ICD-10-CM | POA: Diagnosis present

## 2024-07-01 DIAGNOSIS — F10939 Alcohol use, unspecified with withdrawal, unspecified: Secondary | ICD-10-CM | POA: Diagnosis not present

## 2024-07-01 DIAGNOSIS — F17210 Nicotine dependence, cigarettes, uncomplicated: Secondary | ICD-10-CM | POA: Diagnosis present

## 2024-07-01 DIAGNOSIS — D509 Iron deficiency anemia, unspecified: Secondary | ICD-10-CM | POA: Diagnosis present

## 2024-07-01 DIAGNOSIS — F10239 Alcohol dependence with withdrawal, unspecified: Secondary | ICD-10-CM | POA: Diagnosis present

## 2024-07-01 DIAGNOSIS — Z8674 Personal history of sudden cardiac arrest: Secondary | ICD-10-CM | POA: Diagnosis not present

## 2024-07-01 DIAGNOSIS — J45909 Unspecified asthma, uncomplicated: Secondary | ICD-10-CM | POA: Diagnosis present

## 2024-07-01 DIAGNOSIS — I5042 Chronic combined systolic (congestive) and diastolic (congestive) heart failure: Secondary | ICD-10-CM | POA: Diagnosis present

## 2024-07-01 LAB — CBC
HCT: 35.7 % — ABNORMAL LOW (ref 36.0–46.0)
Hemoglobin: 12.2 g/dL (ref 12.0–15.0)
MCH: 31 pg (ref 26.0–34.0)
MCHC: 34.2 g/dL (ref 30.0–36.0)
MCV: 90.8 fL (ref 80.0–100.0)
Platelets: 84 K/uL — ABNORMAL LOW (ref 150–400)
RBC: 3.93 MIL/uL (ref 3.87–5.11)
RDW: 13.7 % (ref 11.5–15.5)
WBC: 3.8 K/uL — ABNORMAL LOW (ref 4.0–10.5)
nRBC: 0 % (ref 0.0–0.2)

## 2024-07-01 LAB — COMPREHENSIVE METABOLIC PANEL WITH GFR
ALT: 102 U/L — ABNORMAL HIGH (ref 0–44)
ALT: 86 U/L — ABNORMAL HIGH (ref 0–44)
AST: 226 U/L — ABNORMAL HIGH (ref 15–41)
AST: 162 U/L — ABNORMAL HIGH (ref 15–41)
Albumin: 4.7 g/dL (ref 3.5–5.0)
Albumin: 4.3 g/dL (ref 3.5–5.0)
Alkaline Phosphatase: 113 U/L (ref 38–126)
Alkaline Phosphatase: 99 U/L (ref 38–126)
Anion gap: 24 — ABNORMAL HIGH (ref 5–15)
Anion gap: 22 — ABNORMAL HIGH (ref 5–15)
BUN: 7 mg/dL (ref 6–20)
BUN: 7 mg/dL (ref 6–20)
CO2: 15 mmol/L — ABNORMAL LOW (ref 22–32)
CO2: 15 mmol/L — ABNORMAL LOW (ref 22–32)
Calcium: 9.3 mg/dL (ref 8.9–10.3)
Calcium: 8.8 mg/dL — ABNORMAL LOW (ref 8.9–10.3)
Chloride: 103 mmol/L (ref 98–111)
Chloride: 102 mmol/L (ref 98–111)
Creatinine, Ser: 0.62 mg/dL (ref 0.44–1.00)
Creatinine, Ser: 0.58 mg/dL (ref 0.44–1.00)
GFR, Estimated: >60 mL/min
GFR, Estimated: >60 mL/min
Glucose, Bld: 102 mg/dL — ABNORMAL HIGH (ref 70–99)
Glucose, Bld: 104 mg/dL — ABNORMAL HIGH (ref 70–99)
Potassium: 4.2 mmol/L (ref 3.5–5.1)
Potassium: 3.6 mmol/L (ref 3.5–5.1)
Sodium: 141 mmol/L (ref 135–145)
Sodium: 139 mmol/L (ref 135–145)
Total Bilirubin: 0.9 mg/dL (ref 0.0–1.2)
Total Bilirubin: 0.8 mg/dL (ref 0.0–1.2)
Total Protein: 7.8 g/dL (ref 6.5–8.1)
Total Protein: 6.8 g/dL (ref 6.5–8.1)

## 2024-07-01 LAB — BLOOD GAS, VENOUS
Acid-base deficit: 6.3 mmol/L — ABNORMAL HIGH (ref 0.0–2.0)
Bicarbonate: 18.1 mmol/L — ABNORMAL LOW (ref 20.0–28.0)
O2 Saturation: 82.9 %
Patient temperature: 37
pCO2, Ven: 32 mmHg — ABNORMAL LOW (ref 44–60)
pH, Ven: 7.36 (ref 7.25–7.43)
pO2, Ven: 52 mmHg — ABNORMAL HIGH (ref 32–45)

## 2024-07-01 LAB — BASIC METABOLIC PANEL WITH GFR
Anion gap: 23 — ABNORMAL HIGH (ref 5–15)
BUN: 7 mg/dL (ref 6–20)
CO2: 15 mmol/L — ABNORMAL LOW (ref 22–32)
Calcium: 8.8 mg/dL — ABNORMAL LOW (ref 8.9–10.3)
Chloride: 101 mmol/L (ref 98–111)
Creatinine, Ser: 0.57 mg/dL (ref 0.44–1.00)
GFR, Estimated: >60 mL/min
Glucose, Bld: 104 mg/dL — ABNORMAL HIGH (ref 70–99)
Potassium: 3.7 mmol/L (ref 3.5–5.1)
Sodium: 139 mmol/L (ref 135–145)

## 2024-07-01 LAB — CBC WITH DIFFERENTIAL/PLATELET
Abs Immature Granulocytes: 0.04 K/uL (ref 0.00–0.07)
Basophils Absolute: 0 K/uL (ref 0.0–0.1)
Basophils Relative: 0 %
Eosinophils Absolute: 0 K/uL (ref 0.0–0.5)
Eosinophils Relative: 0 %
HCT: 36.2 % (ref 36.0–46.0)
Hemoglobin: 12.3 g/dL (ref 12.0–15.0)
Immature Granulocytes: 1 %
Lymphocytes Relative: 3 %
Lymphs Abs: 0.1 K/uL — ABNORMAL LOW (ref 0.7–4.0)
MCH: 30.8 pg (ref 26.0–34.0)
MCHC: 34 g/dL (ref 30.0–36.0)
MCV: 90.5 fL (ref 80.0–100.0)
Monocytes Absolute: 0 K/uL — ABNORMAL LOW (ref 0.1–1.0)
Monocytes Relative: 1 %
Neutro Abs: 3.4 K/uL (ref 1.7–7.7)
Neutrophils Relative %: 95 %
Platelets: 83 K/uL — ABNORMAL LOW (ref 150–400)
RBC: 4 MIL/uL (ref 3.87–5.11)
RDW: 13.9 % (ref 11.5–15.5)
WBC: 3.6 K/uL — ABNORMAL LOW (ref 4.0–10.5)
nRBC: 0 % (ref 0.0–0.2)

## 2024-07-01 LAB — LACTIC ACID, PLASMA
Lactic Acid, Venous: 1.4 mmol/L (ref 0.5–1.9)
Lactic Acid, Venous: 0.8 mmol/L (ref 0.5–1.9)

## 2024-07-01 LAB — HEPATITIS PANEL, ACUTE
HCV Ab: NONREACTIVE
Hep A IgM: NONREACTIVE
Hep B C IgM: NONREACTIVE
Hepatitis B Surface Ag: NONREACTIVE

## 2024-07-01 LAB — PHOSPHORUS
Phosphorus: 3.5 mg/dL (ref 2.5–4.6)
Phosphorus: 3.9 mg/dL (ref 2.5–4.6)

## 2024-07-01 LAB — HIV ANTIBODY (ROUTINE TESTING W REFLEX): HIV Screen 4th Generation wRfx: NONREACTIVE

## 2024-07-01 LAB — BETA-HYDROXYBUTYRIC ACID
Beta-Hydroxybutyric Acid: 6.02 mmol/L — ABNORMAL HIGH (ref 0.05–0.27)
Beta-Hydroxybutyric Acid: 5.4 mmol/L — ABNORMAL HIGH (ref 0.05–0.27)

## 2024-07-01 LAB — ETHANOL: Alcohol, Ethyl (B): <15 mg/dL

## 2024-07-01 LAB — PROTIME-INR
INR: 1.1 (ref 0.8–1.2)
Prothrombin Time: 15.1 s (ref 11.4–15.2)

## 2024-07-01 LAB — MAGNESIUM
Magnesium: 1.5 mg/dL — ABNORMAL LOW (ref 1.7–2.4)
Magnesium: 3.4 mg/dL — ABNORMAL HIGH (ref 1.7–2.4)

## 2024-07-01 MED ORDER — BOOST / RESOURCE BREEZE PO LIQD CUSTOM
1.0000 | Freq: Three times a day (TID) | ORAL | Status: DC
  Administered 2024-07-02: 1 via ORAL

## 2024-07-01 MED ORDER — LORAZEPAM 2 MG/ML IJ SOLN: 0.5000 mg | Freq: Four times a day (QID) | INTRAMUSCULAR | Status: DC | PRN

## 2024-07-01 MED ORDER — DEXTROSE IN LACTATED RINGERS 5 % IV SOLN: INTRAVENOUS | Status: DC

## 2024-07-01 MED ORDER — MELATONIN 5 MG PO TABS
5.0000 mg | ORAL_TABLET | Freq: Every evening | ORAL | Status: DC | PRN
  Administered 2024-07-02 (×2): 5 mg via ORAL
  Filled 2024-07-01 (×2): qty 1

## 2024-07-01 MED ORDER — POLYETHYLENE GLYCOL 3350 17 G PO PACK: 17.0000 g | PACK | Freq: Every day | ORAL | Status: DC | PRN

## 2024-07-01 MED ORDER — MAGNESIUM SULFATE 2 GM/50ML IV SOLN
2.0000 g | Freq: Once | INTRAVENOUS | Status: AC
  Administered 2024-07-01: 2 g via INTRAVENOUS
  Filled 2024-07-01: qty 50

## 2024-07-01 MED ORDER — POTASSIUM CHLORIDE 10 MEQ/100ML IV SOLN
10.0000 meq | INTRAVENOUS | Status: AC
  Administered 2024-07-01 (×4): 10 meq via INTRAVENOUS
  Filled 2024-07-01 (×3): qty 100

## 2024-07-01 MED ORDER — PROCHLORPERAZINE EDISYLATE 10 MG/2ML IJ SOLN
5.0000 mg | Freq: Four times a day (QID) | INTRAMUSCULAR | Status: DC | PRN
  Administered 2024-07-01 (×2): 5 mg via INTRAVENOUS
  Filled 2024-07-01 (×2): qty 2

## 2024-07-01 NOTE — Discharge Instructions (Signed)

## 2024-07-01 NOTE — Plan of Care (Signed)

## 2024-07-01 NOTE — Progress Notes (Addendum)
 " Progress Note   Patient: Yvette [name] FMW:[id number] DOB: [birth year] DOA: 06/30/2024     0 DOS: the patient was seen and examined on 07/01/2024   Brief hospital course: Per H&P HPI   43 year old female with a past medical history of neutropenia, history of vfib and cardiac arrest secondary to electrolyte disturbance, lung nodules, Kawasaki disease, alcohol abuse, prolonged QTc syndrome, nonischemic cardiomyopathy with recovered EF and history of VF arrest in addition to alcohol associated nausea vomiting gastritis presents to the emergency department with intractable nausea and vomiting since this morning.  The patient denies any headache denies any abdominal pain denies any diarrhea but reports persistent nausea and vomiting and is extremely nervous about any antiemetics will give her given her history of cardiac arrest and prolonged QTc.  She reports minimal to no relief with current therapy so far.  The patient does report she drink alcohol approximately 2 days ago which she reports is a few beers but cannot further quantify. No Cannabis usage.   Past medical records reviewed and summarized Patient's discharge summary from 06/23/2023 with intractable nausea and vomiting, noted to have QTc 450 at that time 08/11/2023:  CSY: 05/16/2023-Dr. Toledo-normal colonoscopy and terminal ileoscopy - EGD: May 16, 2023-Dr. Toledo-hiatal hernia (3 cm) otherwise normal upper endoscopy. Cancer Center note 03/06/2024: Alcohol versus benign ethnic neutropenia, normal cytogenetics and no malignancy   Case discussed with emergency department nurse practitioner and subsequently placed in observation  Assessment and Plan: N/V  Likely a result of her alcohol use. She has a history of esophagitis and esophageal stricture.  Laboratory work up is unrevealing and imaging without findings to suggest etiology for patient's symptoms.  -CLD -Continue dexamethasone  and PRN benzodiazepines for patient's nausea given  prolonged QTC   Alcoholic/starvation ketoacidosis  Continue thiamine  and start D5LR Repeat BMP  Hypomagnesemia  Replace PRN   Alcohol use disorder TOC consulted.  Patient states she drinks due to anxiety.  Will start antianxiety medication when able to take PO  May benefit from referral to psych for CBT on discharge as well  CIWA   Tobacco use disorder  TOC consulted   Transaminitis  Alcoholic liver disease  Improved on AM labs. RUQ US  with findings c/w hepatic steatosis.  Continue to encourage alcohol cessation   Prolonged Qtc H/o polymorphic VT H/o V fib arrest Non sustained VT noted on telemetry. Qtc remains >539ms. Likely a result of electrolyte abnormalities and alcohol use. She has followed with cardiology outpatient and had normal qtc's. Alcohol cessation has been encouraged and patient has been on naltrexone . She was felt not to be a candidate for ICD due to persistent alcohol use during 2023 hospitalization when patient arrested. Patient not on any qtc prolonging medications.  -Mg>2, K>4 -Avoid QTC prolonging medications  -Repeat TTE, if prolonged qtc does not improve with replacing electrolytes could consider cardiology consult.   H/o NICM 2/2 alcohol use now with recovered EF Appears euvolemic Will repeat TTE given last TTE ~ 1 year ago and patient with frequent pvcs.   Leukopenia (Lymphopenia) Thrombocytopenia  Likely in the setting of her chronic alcohol use. She has a history of neutropenia and followed with hematology outpatient. Her cytopenias were thought to be due to her alcohol use.  -Could consider work up of possible viral etiologies of patients symptoms  Subjective: patient states that she drinks 2 24oz beers every other day. She usually drink because of her anxiety. She is not currently on any medicaiton for this. Has been  prescribed zoloft in the past. No SI/HI.   She continues to have N/V.   Had NSVT on telemetry and frequent PVCs. Patient  asympatomatic.   Physical Exam: Vitals:   07/01/24 0339 07/01/24 0441 07/01/24 0731 07/01/24 0731  BP: (!) 159/85  (!) 153/87 (!) 153/87  Pulse: 77  (!) 56 (!) 56  Resp: 19  14   Temp:  97.7 F (36.5 C) 99 F (37.2 C)   TempSrc:  Oral Oral   SpO2: 100%  100%   Weight:      Height:       Physical Exam  Constitutional: In no distress.  Cardiovascular: Normal rate, regular rhythm. No lower extremity edema  Pulmonary: Non labored breathing on room air, no wheezing or rales.   Abdominal: Soft. Non distended and non tender Musculoskeletal: Normal range of motion.     Neurological: Alert and oriented to person, place, and time. Non focal  Skin: Skin is warm and dry.   Data Reviewed:     Latest Ref Rng & Units 07/01/2024    7:54 AM 06/30/2024    4:05 PM 07/29/2023    1:17 PM  BMP  Glucose 70 - 99 mg/dL 897  85  88   BUN 6 - 20 mg/dL 7  5  7    Creatinine 0.44 - 1.00 mg/dL 9.37  9.41  9.48   Sodium 135 - 145 mmol/L 141  140  134   Potassium 3.5 - 5.1 mmol/L 4.2  3.5  3.7   Chloride 98 - 111 mmol/L 103  100  102   CO2 22 - 32 mmol/L 15  19  19    Calcium  8.9 - 10.3 mg/dL 9.3  9.8  8.8    Telmetry   NSVT 25 beats on telemetry and frequent PVCs.  Family Communication: None at bedside   Disposition: Status is: Observation The patient will require care spanning > 2 midnights and should be moved to inpatient because: requiring cardiac monitoring and IV replacement of medications.    Planned Discharge Destination: Home    Time spent: 35 minutes  Author: Alban Pepper, MD 07/01/2024 9:08 AM  For on call review [email or web address].  "

## 2024-07-01 NOTE — Consult Note (Signed)
 PHARMACY CONSULT NOTE - ELECTROLYTES  Pharmacy Consult for Electrolyte Monitoring and Replacement   Recent Labs: Height: 5' 1 (154.9 cm) Weight: 50 kg (110 lb 3.7 oz) IBW/kg (Calculated) : 47.8 Estimated Creatinine Clearance: 69.8 mL/min (by C-G formula based on SCr of 0.58 mg/dL). Potassium (mmol/L)  Date Value  07/01/2024 3.6  07/01/2024 3.7   Magnesium  (mg/dL)  Date Value  87/78/7974 3.4 (H)   Calcium  (mg/dL)  Date Value  87/78/7974 8.8 (L)  07/01/2024 8.8 (L)   Albumin (g/dL)  Date Value  87/78/7974 4.3  02/05/2021 4.9 (H)   Phosphorus (mg/dL)  Date Value  87/78/7974 3.9   Sodium (mmol/L)  Date Value  07/01/2024 139  07/01/2024 139  02/05/2021 141    Assessment  Yvette [name] is a 43 y.o. female presenting with nausea and vomiting. PMH significant for cardiomyopathy, Kawasaki disease, SVT, and neutropenia. Pharmacy has been consulted to monitor and replace electrolytes.  Diet: NPO MIVF: D5LR @ 75 mL/hr Pertinent medications: N/A  Goal of Therapy: Electrolytes WNL  Plan:  K = 3.6, give Kcl 10 mEq IV x 4 Check BMP, Mg, Phos with AM labs  Thank you for allowing pharmacy to be a part of this patient's care.  Kayla JULIANNA Blew, PharmD Clinical Pharmacist 07/01/2024 2:57 PM

## 2024-07-01 NOTE — TOC Initial Note (Signed)
 Transition of Care [facility]) - Initial/Assessment Note    Patient Details  Name: ALLICE GARRO MRN: [id number] Date of Birth: [birth year]  [facility] CM/SW Contact:    Giada Schoppe L Alaiah Lundy, LCSW Phone Number: 07/01/2024, 1:39 PM  Clinical Narrative:                  [facility] consult received for substance abuse counseling/education. TOC does not provide counseling/education. Resources were added to the AVS.        Patient Goals and CMS Choice            Expected Discharge Plan and Services                                              Prior Living Arrangements/Services                       Activities of Daily Living      Permission Sought/Granted                  Emotional Assessment              Admission diagnosis:  Vomiting [R11.10] Patient Active Problem List   Diagnosis Date Noted   Vomiting 06/30/2024   Ketosis (HCC) 06/30/2024   Intractable nausea and vomiting 06/20/2023   Transaminitis 06/20/2023   Alcohol abuse 05/17/2023   Cyclic vomiting syndrome 05/16/2023   Fatty liver 05/10/2023   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  03/2022 08/02/2022   Prolonged Q-T interval on ECG 08/02/2022   Iron  deficiency 07/22/2022   Suicide ideation 04/05/2022   Delirium tremens (HCC) 04/05/2022   Hypovolemic shock (HCC) 04/05/2022   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   Dilated cardiomyopathy (HCC)    Hypophosphatemia 04/01/2022   Acute hypoxemic respiratory failure (HCC) 04/01/2022   Chronic systolic CHF (congestive heart failure) (HCC) 04/01/2022   COVID-19 virus infection 04/01/2022   Ventricular fibrillation (HCC) 03/30/2022   Hypomagnesemia 01/09/2022   Nausea vomiting and diarrhea 01/08/2022   Tobacco abuse 06/28/2021   Alcohol use disorder, severe, dependence (HCC) 06/28/2021   Hematemesis 06/28/2021   Alcoholic liver disease, unspecified 10/26/2020   Alcohol withdrawal (HCC) 10/26/2020    Alcoholic ketoacidosis 10/26/2020   AKI (acute kidney injury) 10/26/2020   High serum osmolar gap 10/26/2020   Hiatal hernia    Portal hypertension (HCC)    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   Intractable vomiting 10/01/2019   Alcohol use, daily 09/30/2019   Elevated LFTs 09/30/2019   Thrombocytopenia 09/30/2019   Hypokalemia    Dehydration    Food poisoning    Neutropenia 06/11/2016   Leukopenia 02/03/2015   PCP:  Osa Geralds, NP Pharmacy:   [facility] [address] ([address]), [place] - [address] [place]) [place] [address] Phone: [phone] Fax: [phone]     Social Drivers of Health (SDOH) Social History: SDOH Screenings   Food Insecurity: No Food Insecurity (06/21/2023)  Housing: Low Risk (06/21/2023)  Transportation Needs: No Transportation Needs (06/21/2023)  Utilities: Not At Risk (06/21/2023)  Depression (PHQ2-9): Low Risk (03/06/2024)  Tobacco Use: High Risk (06/30/2024)   SDOH Interventions:     Readmission Risk Interventions    06/21/2023   11:13 AM  Readmission Risk Prevention Plan  Transportation Screening Complete  PCP or Specialist Appt within 5-7 Days  Complete  Home Care Screening Complete  Medication Review (RN CM) Complete

## 2024-07-01 NOTE — ED Notes (Signed)
 Pt vomited after taking morning medications.

## 2024-07-01 NOTE — Consult Note (Signed)
 PHARMACY CONSULT NOTE - ELECTROLYTES  Pharmacy Consult for Electrolyte Monitoring and Replacement   Recent Labs: Height: 5' 1 (154.9 cm) Weight: 50 kg (110 lb 3.7 oz) IBW/kg (Calculated) : 47.8 Estimated Creatinine Clearance: 69.8 mL/min (by C-G formula based on SCr of 0.62 mg/dL). Potassium (mmol/L)  Date Value  07/01/2024 4.2   Magnesium  (mg/dL)  Date Value  87/78/7974 1.5 (L)   Calcium  (mg/dL)  Date Value  87/78/7974 9.3   Albumin (g/dL)  Date Value  87/78/7974 4.7  02/05/2021 4.9 (H)   Phosphorus (mg/dL)  Date Value  87/78/7974 3.5   Sodium (mmol/L)  Date Value  07/01/2024 141  02/05/2021 141    Assessment  Yvette [name] is a 43 y.o. female presenting with nausea and vomiting. PMH significant for cardiomyopathy, Kawasaki disease, SVT, and neutropenia. Pharmacy has been consulted to monitor and replace electrolytes.  Diet: NPO MIVF: D5LR @ 75 mL/hr Pertinent medications: N/A  Goal of Therapy: Electrolytes WNL  Plan:  Mg 1.5, patient has received total of 4g of IV MagSulf ordered by provider Check BMP, Mg, Phos with AM labs  Thank you for allowing pharmacy to be a part of this patient's care.  Kayla JULIANNA Blew, PharmD Clinical Pharmacist 07/01/2024 12:28 PM

## 2024-07-02 ENCOUNTER — Inpatient Hospital Stay (HOSPITAL_COMMUNITY)
Admit: 2024-07-02 | Discharge: 2024-07-02 | Disposition: A | Discharge status: Home / Self Care | Attending: Student | Admitting: Student

## 2024-07-02 DIAGNOSIS — E8889 Other specified metabolic disorders: Secondary | ICD-10-CM | POA: Diagnosis not present

## 2024-07-02 DIAGNOSIS — R1110 Vomiting, unspecified: Secondary | ICD-10-CM

## 2024-07-02 DIAGNOSIS — F10939 Alcohol use, unspecified with withdrawal, unspecified: Secondary | ICD-10-CM

## 2024-07-02 DIAGNOSIS — R112 Nausea with vomiting, unspecified: Secondary | ICD-10-CM | POA: Diagnosis not present

## 2024-07-02 DIAGNOSIS — I509 Heart failure, unspecified: Secondary | ICD-10-CM | POA: Diagnosis not present

## 2024-07-02 DIAGNOSIS — I493 Ventricular premature depolarization: Secondary | ICD-10-CM | POA: Diagnosis not present

## 2024-07-02 LAB — COMPREHENSIVE METABOLIC PANEL WITH GFR
ALT: 90 U/L — ABNORMAL HIGH (ref 0–44)
AST: 209 U/L — ABNORMAL HIGH (ref 15–41)
Albumin: 4.5 g/dL (ref 3.5–5.0)
Alkaline Phosphatase: 108 U/L (ref 38–126)
Anion gap: 16 — ABNORMAL HIGH (ref 5–15)
BUN: 7 mg/dL (ref 6–20)
CO2: 19 mmol/L — ABNORMAL LOW (ref 22–32)
Calcium: 9.2 mg/dL (ref 8.9–10.3)
Chloride: 101 mmol/L (ref 98–111)
Creatinine, Ser: 0.49 mg/dL (ref 0.44–1.00)
GFR, Estimated: >60 mL/min
Glucose, Bld: 155 mg/dL — ABNORMAL HIGH (ref 70–99)
Potassium: 4.1 mmol/L (ref 3.5–5.1)
Sodium: 136 mmol/L (ref 135–145)
Total Bilirubin: 1 mg/dL (ref 0.0–1.2)
Total Protein: 7.5 g/dL (ref 6.5–8.1)

## 2024-07-02 LAB — ECHOCARDIOGRAM COMPLETE
AR max vel: 1.49 cm2
AV Area VTI: 1.32 cm2
AV Area mean vel: 1.44 cm2
AV Mean grad: 5 mmHg
AV Peak grad: 10.5 mmHg
Ao pk vel: 1.62 m/s
Area-P 1/2: 4.49 cm2
Calc EF: 63.1 %
Height: 61 in
MV VTI: 1.25 cm2
S' Lateral: 2.3 cm
Single Plane A2C EF: 73.1 %
Single Plane A4C EF: 50.2 %
Weight: 1763.68 [oz_av]

## 2024-07-02 LAB — GASTROINTESTINAL PANEL BY PCR, STOOL (REPLACES STOOL CULTURE)

## 2024-07-02 LAB — CBC
HCT: 33.8 % — ABNORMAL LOW (ref 36.0–46.0)
Hemoglobin: 11.6 g/dL — ABNORMAL LOW (ref 12.0–15.0)
MCH: 30.8 pg (ref 26.0–34.0)
MCHC: 34.3 g/dL (ref 30.0–36.0)
MCV: 89.7 fL (ref 80.0–100.0)
Platelets: 76 K/uL — ABNORMAL LOW (ref 150–400)
RBC: 3.77 MIL/uL — ABNORMAL LOW (ref 3.87–5.11)
RDW: 13.4 % (ref 11.5–15.5)
WBC: 3.2 K/uL — ABNORMAL LOW (ref 4.0–10.5)
nRBC: 0 % (ref 0.0–0.2)

## 2024-07-02 LAB — VITAMIN B12: Vitamin B-12: 766 pg/mL (ref 180–914)

## 2024-07-02 LAB — MAGNESIUM: Magnesium: 2.5 mg/dL — ABNORMAL HIGH (ref 1.7–2.4)

## 2024-07-02 LAB — VITAMIN D 25 HYDROXY (VIT D DEFICIENCY, FRACTURES): Vit D, 25-Hydroxy: 7 ng/mL — ABNORMAL LOW (ref 30–100)

## 2024-07-02 LAB — BETA-HYDROXYBUTYRIC ACID: Beta-Hydroxybutyric Acid: 0.86 mmol/L — ABNORMAL HIGH (ref 0.05–0.27)

## 2024-07-02 LAB — PHOSPHORUS: Phosphorus: 1.9 mg/dL — ABNORMAL LOW (ref 2.5–4.6)

## 2024-07-02 MED ORDER — HYDRALAZINE HCL 20 MG/ML IJ SOLN: 5.0000 mg | Freq: Four times a day (QID) | INTRAMUSCULAR | Status: DC | PRN

## 2024-07-02 MED ORDER — DEXTROSE IN LACTATED RINGERS 5 % IV SOLN: INTRAVENOUS | Status: DC

## 2024-07-02 MED ORDER — K PHOS MONO-SOD PHOS DI & MONO 155-852-130 MG PO TABS: 500.0000 mg | ORAL_TABLET | Freq: Two times a day (BID) | ORAL | Status: AC

## 2024-07-02 MED ORDER — HYDRALAZINE HCL 20 MG/ML IJ SOLN
2.0000 mg | Freq: Once | INTRAMUSCULAR | Status: AC
  Administered 2024-07-02: 2 mg via INTRAVENOUS
  Filled 2024-07-02: qty 1

## 2024-07-02 MED ORDER — SODIUM PHOSPHATES 45 MMOLE/15ML IV SOLN
15.0000 mmol | Freq: Once | INTRAVENOUS | Status: AC
  Administered 2024-07-02: 15 mmol via INTRAVENOUS
  Filled 2024-07-02: qty 5

## 2024-07-02 MED ORDER — DEXTROSE IN LACTATED RINGERS 5 % IV SOLN: INTRAVENOUS | Status: AC

## 2024-07-02 NOTE — Progress Notes (Signed)
 " Progress Note   Patient: Yvette [name] FMW:[id number] DOB: 24-Oct-1980 DOA: 06/30/2024     1 DOS: the patient was seen and examined on 07/02/2024   Brief hospital course: Per H&P  43 year old female with a past medical history of neutropenia, history of vfib and cardiac arrest secondary to electrolyte disturbance, lung nodules, Kawasaki disease, alcohol abuse, prolonged QTc syndrome, nonischemic cardiomyopathy with recovered EF and history of VF arrest in addition to alcohol associated nausea vomiting gastritis presents to the emergency department with intractable nausea and vomiting. Patient extremely nervous about any antiemetics will give her given her history of cardiac arrest and prolonged QTc. Reports last drink of alcohol 12/19, denies marijuana use. Admitted for further management   Past medical records reviewed and summarized Patient's discharge summary from 06/23/2023 with intractable nausea and vomiting, noted to have QTc 450 at that time 08/11/2023:  CSY: 05/16/2023-Dr. Toledo-normal colonoscopy and terminal ileoscopy - EGD: May 16, 2023-Dr. Toledo-hiatal hernia (3 cm) otherwise normal upper endoscopy. 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 She has a history of esophagitis and esophageal stricture.  Laboratory work up is unrevealing and imaging without findings to suggest etiology for patient's symptoms Advance to FLD, ADAT Continue dexamethasone  and PRN benzodiazepines for patient's nausea given prolonged QTC  Alcoholic/starvation ketoacidosis  Continue thiamine  and on D5LR AG and bicarb improving Repeat BMP  Hypomagnesemia  Hypophosphatemia Replace PRN   Alcohol use disorder TOC consulted Patient states she drinks due to anxiety, last drink 12/19 Will provide referral to psych for CBT on discharge as well  CIWA    Tobacco use disorder  TOC consulted   Transaminitis  Alcoholic liver disease  Improved on AM labs. RUQ US  with findings c/w hepatic steatosis.  Continue to encourage alcohol cessation  Diarrhea Reports 3-4 episodes of watery diarrhea, suspect if 2/2 alcohol withdrawal GIP panel pending  Prolonged Qtc H/o polymorphic VT H/o V fib arrest Non sustained VT noted on telemetry. Qtc remains >576ms. Likely a result of electrolyte abnormalities and alcohol use She has followed with cardiology outpatient and had normal qtc's.  Alcohol cessation has been encouraged and patient has been on naltrexone . She was felt not to be a candidate for ICD due to persistent alcohol use during 2023 hospitalization when patient arrested. Patient not on any qtc prolonging medications Repeat EKG  Echo shows EF 60 to 65%, [name]. -Mg>2, K>4 -Avoid QTC prolonging medications  - if prolonged qtc does not improve with replacing electrolytes could consider cardiology consult.   H/o NICM 2/2 alcohol use now with recovered EF Appears euvolemic Echo shows EF 60 to 65%, [name].  Leukopenia (Lymphopenia) Thrombocytopenia  Likely in the setting of her chronic alcohol use. She has a history of neutropenia and followed with hematology outpatient. Her cytopenias were thought to be due to her alcohol use.     Subjective: Patient reports symptoms better today, advance to full liquid diet. Reports 3-4 episodes of diarrhea, no abdominal pain/cramps.  Will check GIP Echo with no abnormalities, repeat EKG to monitor QTc   Physical Exam: Vitals:   07/01/24 1644 07/01/24 1918 07/02/24 0005 07/02/24 0341  BP: (!) 142/82 135/87 (!) 157/82 (!) 169/107  Pulse: 63 65 67 69  Resp: 13 18  14   Temp: 98.8 F (37.1 C) 98.8 F (37.1 C) 98.7 F (37.1 C) 99.7 F (37.6 C)  TempSrc: Oral  Oral  Oral  SpO2: 100% 100% 100% 100%  Weight:      Height:       Physical Exam  Constitutional: In no distress.  Cardiovascular:  Normal rate, regular rhythm. No lower extremity edema  Pulmonary: Non labored breathing on room air, no wheezing or rales.   Abdominal: Soft. Non distended and non tender Musculoskeletal: Normal range of motion.     Neurological: Alert and oriented to person, place, and time. Non focal  Skin: Skin is warm and dry.   Data Reviewed:     Latest Ref Rng & Units 07/01/2024   12:47 PM 07/01/2024    7:54 AM 06/30/2024    4:05 PM  BMP  Glucose 70 - 99 mg/dL 70 - 99 mg/dL 895    895  897  85   BUN 6 - 20 mg/dL 6 - 20 mg/dL 7    7  7  5    Creatinine 0.44 - 1.00 mg/dL 9.55 - 8.99 mg/dL 9.41    9.42  9.37  9.41   Sodium 135 - 145 mmol/L 135 - 145 mmol/L 139    139  141  140   Potassium 3.5 - 5.1 mmol/L 3.5 - 5.1 mmol/L 3.6    3.7  4.2  3.5   Chloride 98 - 111 mmol/L 98 - 111 mmol/L 102    101  103  100   CO2 22 - 32 mmol/L 22 - 32 mmol/L 15    15  15  19    Calcium  8.9 - 10.3 mg/dL 8.9 - 89.6 mg/dL 8.8    8.8  9.3  9.8    Telmetry   NSVT 25 beats on telemetry and frequent PVCs.  Family Communication: None at bedside   Disposition: Status is: Observation The patient will require care spanning > 2 midnights and should be moved to inpatient because: requiring cardiac monitoring and IV replacement of medications.    Planned Discharge Destination: Home    Time spent: 55 minutes  Author: Laree Lock, MD 07/02/2024 8:07 AM  For on call review [email or web address].  "

## 2024-07-02 NOTE — Consult Note (Signed)
 PHARMACY CONSULT NOTE - ELECTROLYTES  Pharmacy Consult for Electrolyte Monitoring and Replacement   Recent Labs: Height: 5' 1 (154.9 cm) Weight: 50 kg (110 lb 3.7 oz) IBW/kg (Calculated) : 47.8 Estimated Creatinine Clearance: 69.8 mL/min (by C-G formula based on SCr of 0.49 mg/dL). Potassium (mmol/L)  Date Value  07/02/2024 4.1   Magnesium  (mg/dL)  Date Value  87/77/7974 2.5 (H)   Calcium  (mg/dL)  Date Value  87/77/7974 9.2   Albumin (g/dL)  Date Value  87/77/7974 4.5  02/05/2021 4.9 (H)   Phosphorus (mg/dL)  Date Value  87/77/7974 1.9 (L)   Sodium (mmol/L)  Date Value  07/02/2024 136  02/05/2021 141    Assessment  Yvette [name] is a 43 y.o. female presenting with nausea and vomiting. PMH significant for cardiomyopathy, Kawasaki disease, SVT, and neutropenia. Pharmacy has been consulted to monitor and replace electrolytes.  Diet: CLD MIVF: D5LR @ 75 mL/hr Pertinent medications: N/A  Goal of Therapy: Electrolytes WNL  Plan:  Phos 1.9 >> Kphos neutral 500mg  x 2 doses Check BMP, Mg, Phos with AM labs  Thank you for allowing pharmacy to be a part of this patient's care.  Will M. Lenon, PharmD, BCPS Clinical Pharmacist 07/02/2024 10:42 AM

## 2024-07-02 NOTE — Plan of Care (Signed)

## 2024-07-03 ENCOUNTER — Encounter: Payer: Self-pay | Admitting: Internal Medicine

## 2024-07-03 ENCOUNTER — Other Ambulatory Visit: Payer: Self-pay

## 2024-07-03 DIAGNOSIS — I4581 Long QT syndrome: Secondary | ICD-10-CM

## 2024-07-03 DIAGNOSIS — I5022 Chronic systolic (congestive) heart failure: Secondary | ICD-10-CM

## 2024-07-03 DIAGNOSIS — R112 Nausea with vomiting, unspecified: Secondary | ICD-10-CM | POA: Diagnosis not present

## 2024-07-03 LAB — CBC
HCT: 36.4 % (ref 36.0–46.0)
Hemoglobin: 11.8 g/dL — ABNORMAL LOW (ref 12.0–15.0)
MCH: 30.3 pg (ref 26.0–34.0)
MCHC: 32.4 g/dL (ref 30.0–36.0)
MCV: 93.6 fL (ref 80.0–100.0)
Platelets: 61 K/uL — ABNORMAL LOW (ref 150–400)
RBC: 3.89 MIL/uL (ref 3.87–5.11)
RDW: 13.7 % (ref 11.5–15.5)
WBC: 3.2 K/uL — ABNORMAL LOW (ref 4.0–10.5)
nRBC: 0 % (ref 0.0–0.2)

## 2024-07-03 LAB — COMPREHENSIVE METABOLIC PANEL WITH GFR
ALT: 95 U/L — ABNORMAL HIGH (ref 0–44)
AST: 204 U/L — ABNORMAL HIGH (ref 15–41)
Albumin: 4.1 g/dL (ref 3.5–5.0)
Alkaline Phosphatase: 95 U/L (ref 38–126)
Anion gap: 14 (ref 5–15)
BUN: 11 mg/dL (ref 6–20)
CO2: 21 mmol/L — ABNORMAL LOW (ref 22–32)
Calcium: 8.9 mg/dL (ref 8.9–10.3)
Chloride: 99 mmol/L (ref 98–111)
Creatinine, Ser: 0.5 mg/dL (ref 0.44–1.00)
GFR, Estimated: >60 mL/min
Glucose, Bld: 150 mg/dL — ABNORMAL HIGH (ref 70–99)
Potassium: 4.2 mmol/L (ref 3.5–5.1)
Sodium: 134 mmol/L — ABNORMAL LOW (ref 135–145)
Total Bilirubin: 0.7 mg/dL (ref 0.0–1.2)
Total Protein: 6.6 g/dL (ref 6.5–8.1)

## 2024-07-03 LAB — PHOSPHORUS: Phosphorus: 2.7 mg/dL (ref 2.5–4.6)

## 2024-07-03 LAB — MAGNESIUM: Magnesium: 1.9 mg/dL (ref 1.7–2.4)

## 2024-07-03 MED ORDER — PANTOPRAZOLE SODIUM 40 MG PO TBEC: 40.0000 mg | DELAYED_RELEASE_TABLET | Freq: Every day | ORAL | Status: DC

## 2024-07-03 MED ORDER — ENSURE PLUS HIGH PROTEIN PO LIQD: 237.0000 mL | Freq: Two times a day (BID) | ORAL | Status: DC

## 2024-07-03 MED ORDER — FOLIC ACID 1 MG PO TABS
1.0000 mg | ORAL_TABLET | Freq: Every day | ORAL | 1 refills | Status: AC
  Filled 2024-07-03: qty 30, 30d supply, fill #0

## 2024-07-03 MED ORDER — VITAMIN B-1 100 MG PO TABS
100.0000 mg | ORAL_TABLET | Freq: Every day | ORAL | 1 refills | Status: AC
  Filled 2024-07-03: qty 30, 30d supply, fill #0

## 2024-07-03 MED ORDER — ADULT MULTIVITAMIN W/MINERALS CH
1.0000 | ORAL_TABLET | Freq: Every day | ORAL | 1 refills | Status: AC
  Filled 2024-07-03: qty 30, 30d supply, fill #0

## 2024-07-03 NOTE — Plan of Care (Signed)

## 2024-07-03 NOTE — Discharge Summary (Addendum)
 " Physician Discharge Summary   Patient: Yvette [name] MRN: [id number] DOB: [birth year]  Admit date:     06/30/2024  Discharge date: 07/03/2024  Discharge Physician: Laree Lock   PCP: Osa Geralds, NP   Recommendations at discharge:   Follow up with PCP in 3-5 days - Repeat CBC, CMP, Mag, Phos Monitor HR  Follow up with Cardiology outpatient  Discharge Diagnoses: Principal Problem:   Vomiting Active Problems:   Transaminitis   Alcohol withdrawal (HCC)   Neutropenia   Prolonged Q-T interval on ECG   Ketosis [facility])   Hospital Course: 43 year old female with a past medical history of neutropenia, history of vfib and cardiac arrest secondary to electrolyte disturbance, lung nodules, Kawasaki disease, alcohol abuse, prolonged QTc syndrome, nonischemic cardiomyopathy with recovered EF and history of VF arrest in addition to alcohol associated nausea vomiting gastritis presents to the emergency department with intractable nausea and vomiting. Patient extremely nervous about any antiemetics will give her given her history of cardiac arrest and prolonged QTc. Reports last drink of alcohol 12/19, denies marijuana use. Admitted for further management  Nausea/vomiting - resolved Likely a result of her alcohol use. She has a history of esophagitis and esophageal stricture.  Laboratory work up is unrevealing and imaging without findings to suggest etiology for patient's symptoms Was given dexamethasone , as needed benzodiazepines for nausea due to prolonged Qtc Symptoms resolved, tolerating diet.  On Phenergan  as needed at home   Alcoholic/starvation ketoacidosis - resolved   Hypomagnesemia  Hypophosphatemia Repleted   Alcohol use disorder Patient states she drinks due to anxiety, last drink 12/19 Monitored for withdrawal, on FA, MV, B1 Follow-up with PCP outpatient, resources per SW   Tobacco use disorder  Counseling cessation   Transaminitis  Alcoholic liver disease  RUQ  US  with findings c/w hepatic steatosis.  Continue to encourage alcohol cessation   Diarrhea - resolved GIP negative   Prolonged Qtc H/o polymorphic VT H/o V fib arrest Non sustained VT noted on telemetry. Qtc remains >535ms. Likely a result of electrolyte abnormalities and alcohol use Alcohol cessation has been encouraged and patient has been on naltrexone . She was felt not to be a candidate for ICD due to persistent alcohol use during 2023 hospitalization when patient arrested. Patient not on any qtc prolonging medications Repeat EKG Qtc 589. Electrolytes monitored and replaced Seen by Cardiology, appreciate recs Follow up outpatient , Avoid QTC prolonging medications    H/o NICM 2/2 alcohol use now with recovered EF Appears euvolemic, not on BB due to bradycardia Echo shows EF 60 to 65%, [name]   Leukopenia (Lymphopenia) Thrombocytopenia  Likely in the setting of her chronic alcohol use. She has a history of neutropenia and followed with hematology outpatient  -- Patient symptoms have significantly improved, nausea/vomiting and diarrhea have resolved.  Continue to have prolonged QTc on repeat EKG and thrombocytopenia with platelet count downtrending.  Patient insisted she would like to go home today and follow-up with her PCP/cardiology outpatient.  Discussed risk/benefits, insisted on being discharged   Pain control - [place]  Controlled Substance Reporting System database was reviewed. and patient was instructed, not to drive, operate heavy machinery, perform activities at heights, swimming or participation in water  activities or provide baby-sitting services while on Pain, Sleep and Anxiety Medications; until their outpatient Physician has advised to do so again. Also recommended to not to take more than prescribed Pain, Sleep and Anxiety Medications.  Consultants: Cardiology Procedures performed: None  Disposition: Home Diet recommendation:  Discharge  Diet Orders (From  admission, onward)     Start     Ordered   07/03/24 0000  Diet general        07/03/24 1202            DISCHARGE MEDICATION: Allergies as of 07/03/2024       Reactions   Inapsine  [droperidol ] Anaphylaxis   Zofran  [ondansetron ] Other (See Comments)   Prolonged QT        Medication List     TAKE these medications    Depo-Provera 150 MG/ML injection Generic drug: medroxyPROGESTERone Inject 150 mg into the muscle every 3 (three) months.   folic acid  1 MG tablet Commonly known as: FOLVITE  Take 1 tablet (1 mg total) by mouth daily. Start taking on: July 04, 2024   multivitamin with minerals Tabs tablet Take 1 tablet by mouth daily. Start taking on: July 04, 2024   pantoprazole  40 MG tablet Commonly known as: PROTONIX  Take 1 tablet (40 mg total) by mouth daily.   promethazine  12.5 MG tablet Commonly known as: PHENERGAN  Take 1 tablet (12.5 mg total) by mouth every 6 (six) hours as needed for nausea or vomiting.   thiamine  100 MG tablet Commonly known as: Vitamin B-1 Take 1 tablet (100 mg total) by mouth daily. Start taking on: July 04, 2024        Discharge Exam: Fredricka Weights   06/30/24 1603  Weight: 50 kg    Constitutional: In no distress Cardiovascular: Normal rate, regular rhythm. No lower extremity edema  Pulmonary: Non labored breathing on room air, no wheezing or rales Abdominal: Soft. Non distended and non tender Musculoskeletal: Normal range of motion Neurological: Alert and oriented to person, place, and time. Non focal  Skin: Skin is warm and dry  Condition at discharge: fair  The results of significant diagnostics from this hospitalization (including imaging, microbiology, ancillary and laboratory) are listed below for reference.   Imaging Studies: ECHOCARDIOGRAM COMPLETE Result Date: 07/02/2024    ECHOCARDIOGRAM REPORT   Patient Name:   Yvette [name] Date of Exam: 07/02/2024 Medical Rec #:  [id number]      Height:        61.0 in Accession #:    [id number]     Weight:       110.2 lb Date of Birth:  05/11/1981     BSA:          1.466 m Patient Age:    43 years       BP:           169/107 mmHg Patient Gender: F              HR:           86 bpm. Exam Location:  [facility] Procedure: 2D Echo, Cardiac Doppler and Color Doppler (Both Spectral and Color            Flow Doppler were utilized during procedure). Indications:     Frequent PVCs  History:         Patient has prior history of Echocardiogram examinations, most                  recent 08/25/2022. Abnormal ECG.  Sonographer:     Ashley McNeely-Sloane Referring Phys:  [id number] PRINCETON CARTER Diagnosing Phys: Deatrice Cage MD IMPRESSIONS  1. Left ventricular ejection fraction, by estimation, is 60 to 65%. The left ventricle has normal function. The left ventricle has no regional wall motion abnormalities. Left ventricular diastolic parameters  were normal.  2. Right ventricular systolic function is normal. The right ventricular size is normal. Tricuspid regurgitation signal is inadequate for assessing PA pressure.  3. The mitral valve is normal in structure. No evidence of mitral valve regurgitation. No evidence of mitral stenosis.  4. The aortic valve is normal in structure. Aortic valve regurgitation is not visualized. No aortic stenosis is present.  5. The inferior vena cava is normal in size with greater than 50% respiratory variability, suggesting right atrial pressure of 3 mmHg. FINDINGS  Left Ventricle: Left ventricular ejection fraction, by estimation, is 60 to 65%. The left ventricle has normal function. The left ventricle has no regional wall motion abnormalities. The left ventricular internal cavity size was normal in size. There is  no left ventricular hypertrophy. Left ventricular diastolic parameters were normal. Right Ventricle: The right ventricular size is normal. No increase in right ventricular wall thickness. Right ventricular systolic function is normal. Tricuspid  regurgitation signal is inadequate for assessing PA pressure. Left Atrium: Left atrial size was normal in size. Right Atrium: Right atrial size was normal in size. Pericardium: There is no evidence of pericardial effusion. Mitral Valve: The mitral valve is normal in structure. No evidence of mitral valve regurgitation. No evidence of mitral valve stenosis. MV peak gradient, 8.1 mmHg. The mean mitral valve gradient is 3.0 mmHg. Tricuspid Valve: The tricuspid valve is normal in structure. Tricuspid valve regurgitation is trivial. No evidence of tricuspid stenosis. Aortic Valve: The aortic valve is normal in structure. Aortic valve regurgitation is not visualized. No aortic stenosis is present. Aortic valve mean gradient measures 5.0 mmHg. Aortic valve peak gradient measures 10.5 mmHg. Aortic valve area, by VTI measures 1.32 cm. Pulmonic Valve: The pulmonic valve was normal in structure. Pulmonic valve regurgitation is mild. No evidence of pulmonic stenosis. Aorta: The aortic root is normal in size and structure. Venous: The inferior vena cava is normal in size with greater than 50% respiratory variability, suggesting right atrial pressure of 3 mmHg. IAS/Shunts: No atrial level shunt detected by color flow Doppler.  LEFT VENTRICLE PLAX 2D LVIDd:         3.90 cm     Diastology LVIDs:         2.30 cm     LV e' medial:    8.92 cm/s LV PW:         0.80 cm     LV E/e' medial:  12.7 LV IVS:        0.60 cm     LV e' lateral:   17.80 cm/s LVOT diam:     1.50 cm     LV E/e' lateral: 6.3 LV SV:         42 LV SV Index:   29 LVOT Area:     1.77 cm LV IVRT:       85 msec  LV Volumes (MOD) LV vol d, MOD A2C: 52.5 ml LV vol d, MOD A4C: 46.8 ml LV vol s, MOD A2C: 14.1 ml LV vol s, MOD A4C: 23.3 ml LV SV MOD A2C:     38.4 ml LV SV MOD A4C:     46.8 ml LV SV MOD BP:      32.1 ml RIGHT VENTRICLE             IVC RV Basal diam:  3.30 cm     IVC diam: 1.70 cm RV Mid diam:    2.60 cm RV S prime:     13.50 cm/s  PULMONARY VEINS TAPSE  (M-mode): 1.9 cm      A Reversal Duration: 169.00 msec                             A Reversal Velocity: 43.20 cm/s                             Diastolic Velocity:  57.50 cm/s                             S/D Velocity:        1.10                             Systolic Velocity:   61.30 cm/s LEFT ATRIUM             Index        RIGHT ATRIUM          Index LA diam:        2.90 cm 1.98 cm/m   RA Area:     8.81 cm LA Vol (A2C):   36.4 ml 24.82 ml/m  RA Volume:   17.30 ml 11.80 ml/m LA Vol (A4C):   16.3 ml 11.12 ml/m LA Biplane Vol: 24.9 ml 16.98 ml/m  AORTIC VALVE                     PULMONIC VALVE AV Area (Vmax):    1.49 cm      PV Vmax:          1.09 m/s AV Area (Vmean):   1.44 cm      PV Vmean:         77.000 cm/s AV Area (VTI):     1.32 cm      PV VTI:           0.203 m AV Vmax:           162.00 cm/s   PV Peak grad:     4.8 mmHg AV Vmean:          107.000 cm/s  PV Mean grad:     3.0 mmHg AV VTI:            0.322 m       PR End Diast Vel: 5.02 msec AV Peak Grad:      10.5 mmHg     RVOT Peak grad:   3 mmHg AV Mean Grad:      5.0 mmHg LVOT Vmax:         137.00 cm/s LVOT Vmean:        86.900 cm/s LVOT VTI:          0.240 m LVOT/AV VTI ratio: 0.75  AORTA Ao Root diam: 2.70 cm Ao Asc diam:  2.30 cm MITRAL VALVE MV Area (PHT): 4.49 cm     SHUNTS MV Area VTI:   1.25 cm     Systemic VTI:  0.24 m MV Peak grad:  8.1 mmHg     Systemic Diam: 1.50 cm MV Mean grad:  3.0 mmHg     Pulmonic VTI:  0.201 m MV Vmax:       1.42 m/s MV Vmean:      77.4 cm/s MV Decel Time: 169 msec MV E velocity: 113.00 cm/s MV A velocity: 97.90 cm/s MV  E/A ratio:  1.15 Deatrice Cage MD Electronically signed by Deatrice Cage MD Signature Date/Time: 07/02/2024/11:58:20 AM    Final    US  Abdomen Limited RUQ (LIVER/GB) Result Date: 06/30/2024 EXAM: Right Upper Quadrant Abdominal Ultrasound 06/30/2024 07:23:15 PM TECHNIQUE: Real-time ultrasonography of the right upper quadrant of the abdomen was performed. COMPARISON: Ultrasound 05/10/2023  CLINICAL HISTORY: Elevated liver enzymes. FINDINGS: LIVER: Increased hepatic echogenicity is compatible with hepatic steatosis. Patent portal vein with antegrade flow. No intrahepatic biliary ductal dilatation. No evidence of mass. BILIARY SYSTEM: No pericholecystic fluid or wall thickening. No cholelithiasis. The bile duct measures 3 mm. No evidence of acute cholecystitis. OTHER: No right upper quadrant ascites. IMPRESSION: 1. Hepatic steatosis. 2. No sonographic findings of acute cholecystitis. Electronically signed by: Norman Gatlin MD 06/30/2024 07:28 PM EST RP Workstation: [id number]    Microbiology: Results for orders placed or performed during the hospital encounter of 06/30/24  Gastrointestinal Panel by PCR , Stool     Status: None   Collection Time: 07/02/24  4:15 PM   Specimen: Stool  Result Value Ref Range Status   Campylobacter species NOT DETECTED NOT DETECTED Final   Plesimonas shigelloides NOT DETECTED NOT DETECTED Final   Salmonella species NOT DETECTED NOT DETECTED Final   Yersinia enterocolitica NOT DETECTED NOT DETECTED Final   Vibrio species NOT DETECTED NOT DETECTED Final   Vibrio cholerae NOT DETECTED NOT DETECTED Final   Enteroaggregative E coli (EAEC) NOT DETECTED NOT DETECTED Final   Enteropathogenic E coli (EPEC) NOT DETECTED NOT DETECTED Final   Enterotoxigenic E coli (ETEC) NOT DETECTED NOT DETECTED Final   Shiga like toxin producing E coli (STEC) NOT DETECTED NOT DETECTED Final   Shigella/Enteroinvasive E coli (EIEC) NOT DETECTED NOT DETECTED Final   Cryptosporidium NOT DETECTED NOT DETECTED Final   Cyclospora cayetanensis NOT DETECTED NOT DETECTED Final   Entamoeba histolytica NOT DETECTED NOT DETECTED Final   Giardia lamblia NOT DETECTED NOT DETECTED Final   Adenovirus F40/41 NOT DETECTED NOT DETECTED Final   Astrovirus NOT DETECTED NOT DETECTED Final   Norovirus GI/GII NOT DETECTED NOT DETECTED Final   Rotavirus A NOT DETECTED NOT DETECTED Final    Sapovirus (I, II, IV, and V) NOT DETECTED NOT DETECTED Final    Comment: Performed at [facility], [address] [place], [place] [address]    Labs: CBC: Recent Labs  Lab 06/30/24 1605 07/01/24 0754 07/02/24 1022 07/03/24 0535  WBC 2.2* 3.6*  3.8* 3.2* 3.2*  NEUTROABS  --  3.4  --   --   HGB 14.1 12.3  12.2 11.6* 11.8*  HCT 40.9 36.2  35.7* 33.8* 36.4  MCV 89.7 90.5  90.8 89.7 93.6  PLT 103* 83*  84* 76* 61*   Basic Metabolic Panel: Recent Labs  Lab 06/30/24 1605 07/01/24 0754 07/01/24 1247 07/02/24 0903 07/03/24 0535  NA 140 141 139  139 136 134*  K 3.5 4.2 3.7  3.6 4.1 4.2  CL 100 103 101  102 101 99  CO2 19* 15* 15*  15* 19* 21*  GLUCOSE 85 102* 104*  104* 155* 150*  BUN 5* 7 7  7 7 11   CREATININE 0.58 0.62 0.57  0.58 0.49 0.50  CALCIUM  9.8 9.3 8.8*  8.8* 9.2 8.9  MG  --  1.5* 3.4* 2.5* 1.9  PHOS  --  3.5 3.9 1.9* 2.7   Liver Function Tests: Recent Labs  Lab 06/30/24 1605 07/01/24 0754 07/01/24 1247 07/02/24 0903 07/03/24 0535  AST 526* 226* 162*  209* 204*  ALT 142* 102* 86* 90* 95*  ALKPHOS 143* 113 99 108 95  BILITOT 0.9 0.9 0.8 1.0 0.7  PROT 8.9* 7.8 6.8 7.5 6.6  ALBUMIN 5.3* 4.7 4.3 4.5 4.1   CBG: No results for input(s): GLUCAP in the last 168 hours.  Discharge time spent: greater than 30 minutes.  Signed: Laree Lock, MD [facility] 07/03/2024 "

## 2024-07-03 NOTE — Consult Note (Signed)
 "  Cardiology Consultation:   Patient ID: Yvette [name]; [id number]; Jan 01, 1981   Admit date: 06/30/2024 Date of Consult: 07/03/2024  Primary Care Provider: Osa Geralds, NP Primary Cardiologist: Darron Primary Electrophysiologist:  None   Patient Profile:   Yvette [name] is a 43 y.o. 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 Jerelene.  History of Present Illness:   In the setting of alcoholism and alcoholic gastritis, she has a history of multiple admissions and ED visits in the setting of nausea, vomiting, hypokalemia, and hypomagnesemia.  She had normal EGDs in the past.  In 06/2021, she was admitted with nausea, vomiting, and electrolyte abnormalities.  She was also being treated with Phenergan  on top of usual home dose of Remeron .  In that setting, she was having frequent ventricular ectopy and runs of ventricular tachycardia with significant QT prolongation.  She required intravenous lidocaine .  Echo showed an EF of 50-55% and she was subsequently placed on beta-blocker therapy with recommendation for alcohol cessation.  Subsequent outpatient monitoring showed predominantly sinus rhythm and sinus tachycardia with an average heart rate of 106 bpm with rare PACs, PVCs, and 1 brief run of PSVT.  Exercise treadmill testing in March 2023 was negative for ischemic EKG changes with exercise time of 6 minutes and 19 seconds.    In 03/2022, she was readmitted with gastritis and URI  symptoms (found to be COVID-positive).  Following dose of droperidol  in the emergency department, she became unresponsive and required intubation, CPR, and defibrillation in the setting of ventricular fibrillation.  Repeat echo showed EF of 35 to 40% with severe anterolateral and anterior hypokinesis.  She was noted to have prolonged QT on telemetry.  GDMT was limited by borderline hypotension and she was again counseled on the importance of alcohol cessation and avoidance of QT prolonging medications including antiemetics.  Follow-up echo in February 2024 showed EF of 60 to 65% without regional wall motion abnormalities.  She was last seen in the office in 04/2023 with stable QTc.  She has since been admitted to the hospital several times for intractable nausea and vomiting.   She was admitted to [facility] on 06/30/2024 with recurrent intractable nausea and vomiting in the setting of ongoing alcohol abuse.  EKG was obtained on 07/02/2024 and demonstrated sinus rhythm with rare PVCs, prolonged QT of 550 ms, baseline artifact, and no acute ST-T changes labs: Magnesium  1.5 repleted to 3.4 currently 1.9, potassium 3.5.  Echo showed an EF of 60 to 65%, no regional wall motion abnormalities, normal LV diastolic function parameters, normal RV systolic function and ventricular cavity size, no significant valvular abnormality, and normal CVP.  Never with symptoms of angina or cardiac decompensation.  No dizziness, near syncope or syncope outside of vagal episodes associated with emesis.  Last episode of emesis on 12/22.     Past Medical History:  Diagnosis Date   Asthma    Cardiac arrest (HCC)    ETOH abuse    H/O Kawasaki's disease    as 21 month old child and at 26 years  History of anemia    History of blood transfusion 1984   Hypokalemia    Hypomagnesemia    a. in the setting of n/v->gastritis/etoh.   Leukopenia    Neutropenia    NICM (nonischemic cardiomyopathy) (HCC)    a. 06/2021 Echo: EF 50-55%, nl  RV fxn; b. 09/2021 ETT: Ex time 6:19, [name], [name]; c. 03/2022 Echo: EF 35-40%, [name]; d. 08/2022 Echo: EF 60-65%, no rwma, [name].   NSVT (nonsustained ventricular tachycardia) (HCC)    a. Noted during admission 06/2021 assoc w/ palpitations and presyncope in the setting of profound QT prolongation/gastritis.   Tobacco abuse    Transaminitis     Past Surgical History:  Procedure Laterality Date   ESOPHAGOGASTRODUODENOSCOPY N/A 10/29/2020   Procedure: ESOPHAGOGASTRODUODENOSCOPY (EGD);  [name], MD;  Location: [facility] ENDOSCOPY;  Service: Gastroenterology;  [name];   ESOPHAGOGASTRODUODENOSCOPY (EGD) WITH PROPOFOL  N/A 03/14/2020   Procedure: ESOPHAGOGASTRODUODENOSCOPY (EGD) WITH PROPOFOL ;  [name], MD;  [name];  Service: Endoscopy;  [name];   ESOPHAGOGASTRODUODENOSCOPY (EGD) WITH PROPOFOL  N/A 02/25/2021   Procedure: ESOPHAGOGASTRODUODENOSCOPY (EGD) WITH PROPOFOL ;  [name], MD;  [name];  Service: Endoscopy;  [name];   ESOPHAGOGASTRODUODENOSCOPY (EGD) WITH PROPOFOL  N/A 06/25/2022   Procedure: ESOPHAGOGASTRODUODENOSCOPY (EGD) WITH PROPOFOL ;  [name], DO;  Location: [facility] ENDOSCOPY;  Service: Gastroenterology;  [name];     Home Meds: Prior to Admission medications  Medication Sig Start Date End Date Taking? Authorizing Provider  DEPO-PROVERA 150 MG/ML injection Inject 150 mg into the muscle every 3 (three) months. 06/06/24  Yes [provider]  pantoprazole  (PROTONIX ) 40 MG tablet Take 1 tablet (40 mg total) by mouth daily. 05/11/23  Yes Laurita Pillion, MD  promethazine  (PHENERGAN ) 12.5 MG tablet Take 1 tablet (12.5 mg total) by mouth every 6 (six) hours as needed for nausea or vomiting. 06/20/23  Yes Cyrena Mylar, MD    Inpatient Medications: Scheduled Meds:  enoxaparin  (LOVENOX ) injection  40 mg  Subcutaneous Q24H   feeding supplement  237 mL Oral BID BM   folic acid   1 mg Oral Daily   LORazepam   0-4 mg Intravenous Q12H   multivitamin with minerals  1 tablet Oral Daily   pantoprazole  (PROTONIX ) IV  40 mg Intravenous Q12H   phosphorus  500 mg Oral BID   thiamine   100 mg Oral Daily   Or   thiamine   100 mg Intravenous Daily   Continuous Infusions:  PRN Meds: hydrALAZINE , LORazepam , LORazepam  **OR** LORazepam , melatonin, polyethylene glycol  Allergies:  Allergies[1]  Social History:   Social History   Socioeconomic History   Marital status: Significant Other    Spouse name: Not on file   Number of children: Not on file   Years of education: Not on file   Highest education level: Not on file  Occupational History   Not on file  Tobacco Use   Smoking status: Every Day    Current packs/day: 0.25    Average packs/day: 0.3 packs/day for 10.0 years (2.5 ttl pk-yrs)    Types: Cigarettes   Smokeless tobacco: Never   Tobacco comments:    05/27/22 3 cig per day   Vaping Use   Vaping status: Never Used  Substance and Sexual Activity   Alcohol use: Yes    Alcohol/week: 61.0 standard drinks of alcohol    Types: 21 Cans of beer, 40 Standard drinks or  equivalent per week    Comment: daily; 05/27/22 approx 2-3 beers week for the last 2 months   Drug use: No   Sexual activity: Yes    Partners: Male    Birth control/protection: Injection  Other Topics Concern   Not on file  Social History Narrative   Lives locally with husband and daughter.  Previously lived in [place] , [place].  Does not routinely exercise.   Social Drivers of Health   Tobacco Use: High Risk (06/30/2024)   Patient History    Smoking Tobacco Use: Every Day    Smokeless Tobacco Use: Never    Passive Exposure: Not on file  Financial Resource Strain: Not on file  Food Insecurity: No Food Insecurity (07/01/2024)   Epic    Worried About Programme Researcher, Broadcasting/film/video in the Last Year: Never true    Ran Out of Food in  the Last Year: Never true  Transportation Needs: No Transportation Needs (07/01/2024)   Epic    Lack of Transportation (Medical): No    Lack of Transportation (Non-Medical): No  Physical Activity: Not on file  Stress: Not on file  Social Connections: Not on file  Intimate Partner Violence: Not At Risk (07/01/2024)   Epic    Fear of Current or Ex-Partner: No    Emotionally Abused: No    Physically Abused: No    Sexually Abused: No  Depression (PHQ2-9): Low Risk (03/06/2024)   Depression (PHQ2-9)    PHQ-2 Score: 0  Alcohol Screen: Not on file  Housing: Low Risk (07/01/2024)   Epic    Unable to Pay for Housing in the Last Year: No    Number of Times Moved in the Last Year: 0    Homeless in the Last Year: No  Utilities: Not At Risk (07/01/2024)   Epic    Threatened with loss of utilities: No  Health Literacy: Not on file     Family History:   Family History  Problem Relation Age of Onset   Lupus Mother    Prostate cancer Father    Cancer Paternal Aunt        Breast    Cancer Paternal Aunt        Breast     ROS:  Review of Systems  Constitutional:  Positive for malaise/fatigue. Negative for chills, diaphoresis, fever and weight loss.  HENT:  Negative for congestion.   Eyes:  Negative for discharge and redness.  Respiratory:  Negative for cough, sputum production, [name], palpitations, orthopnea, claudication, leg swelling and PND.  Gastrointestinal:  Positive for nausea and vomiting. Negative for abdominal pain, blood in stool, constipation, diarrhea, heartburn and melena.  Musculoskeletal:  Negative for falls and myalgias.  Skin:  Negative for rash.  Neurological:  Positive for weakness. Negative for dizziness, tingling, tremors, sensory change, speech change, focal weakness and loss of consciousness.  Endo/Heme/Allergies:  Does not bruise/bleed easily.  Psychiatric/Behavioral:  Negative for substance  abuse. The patient is not nervous/anxious.   All other systems reviewed and are negative.     Physical Exam/Data:   Vitals:   07/02/24 1606 07/02/24 2207 07/03/24 0439 07/03/24 0826  BP: (!) 121/93 118/82 (!) 137/95 (!) 164/103  Pulse: 63 97 63 (!) 53  Resp: 16 18 18 18   Temp: 98.1 F (36.7 C) 99.8 F (37.7 C) 99.3 F (37.4 C) 99.1 F (37.3 C)  TempSrc: Oral Oral Oral Oral  SpO2: 100% 100% 100%  100%  Weight:      Height:        Intake/Output Summary (Last 24 hours) at 07/03/2024 0922 Last data filed at 07/03/2024 0500 Gross per 24 hour  Intake 663.02 ml  Output --  Net 663.02 ml   Filed Weights   06/30/24 1603  Weight: 50 kg   Body mass index is 20.83 kg/m.   Physical Exam: General: Well developed, well nourished, in no acute distress. Head: Normocephalic, atraumatic, sclera non-icteric, no xanthomas, nares without discharge.  Neck: Negative for carotid bruits. JVD not elevated. Lungs: Clear bilaterally to auscultation without wheezes, rales, or rhonchi. Breathing is unlabored. Heart: RRR with S1 S2. No murmurs, rubs, or gallops appreciated. Abdomen: Soft, non-tender, non-distended with normoactive bowel sounds. No hepatomegaly. No rebound/guarding. No obvious abdominal masses. Msk:  Strength and tone appear normal for age. Extremities: No clubbing or cyanosis. No edema. Distal pedal pulses are 2+ and equal bilaterally. Neuro: Alert and oriented X 3. No facial asymmetry. No focal deficit. Moves all extremities spontaneously. Psych:  Responds to questions appropriately with a normal affect.   EKG:  The EKG was personally reviewed and demonstrates: sinus rhythm with rare PVCs, prolonged QT of 550 ms, baseline artifact, and no acute ST-T changes  Telemetry:  Telemetry was personally reviewed and demonstrates: SR with QT 520 msec  Weights: Filed Weights   06/30/24 1603  Weight: 50 kg    Relevant CV Studies:  2D echo 09/03/2023: 1. Left ventricular ejection  fraction, by estimation, is 60 to 65%. The  left ventricle has normal function. The left ventricle has no regional  wall motion abnormalities. Left ventricular diastolic parameters were  normal.   2. Right ventricular systolic function is normal. The right ventricular  size is normal. Tricuspid regurgitation signal is inadequate for assessing  PA pressure.   3. The mitral valve is normal in structure. No evidence of mitral valve  regurgitation. No evidence of mitral stenosis.   4. The aortic valve is normal in structure. Aortic valve regurgitation is  not visualized. No aortic stenosis is present.   5. The inferior vena cava is normal in size with greater than 50%  respiratory variability, suggesting right atrial pressure of 3 mmHg.   Laboratory Data:  Chemistry Recent Labs  Lab 07/01/24 1247 07/02/24 0903 07/03/24 0535  NA 139  139 136 134*  K 3.7  3.6 4.1 4.2  CL 101  102 101 99  CO2 15*  15* 19* 21*  GLUCOSE 104*  104* 155* 150*  BUN 7  7 7 11   CREATININE 0.57  0.58 0.49 0.50  CALCIUM  8.8*  8.8* 9.2 8.9  GFRNONAA >60  >60 >60 >60  ANIONGAP 23*  22* 16* 14    Recent Labs  Lab 07/01/24 1247 07/02/24 0903 07/03/24 0535  PROT 6.8 7.5 6.6  ALBUMIN 4.3 4.5 4.1  AST 162* 209* 204*  ALT 86* 90* 95*  ALKPHOS 99 108 95  BILITOT 0.8 1.0 0.7   Hematology Recent Labs  Lab 07/01/24 0754 07/02/24 1022 07/03/24 0535  WBC 3.6*  3.8* 3.2* 3.2*  RBC 4.00  3.93 3.77* 3.89  HGB 12.3  12.2 11.6* 11.8*  HCT 36.2  35.7* 33.8* 36.4  MCV 90.5  90.8 89.7 93.6  MCH 30.8  31.0 30.8 30.3  MCHC 34.0  34.2 34.3 32.4  RDW 13.9  13.7 13.4 13.7  PLT 83*  84* 76* 61*   Cardiac EnzymesNo results for input(s): TROPONINI in the last 168 hours.  No results for input(s): TROPIPOC in the last 168 hours.  BNPNo results for input(s): BNP, PROBNP in the last 168 hours.  DDimer No results for input(s): DDIMER in the last 168 hours.  Radiology/Studies:   US  Abdomen  Limited RUQ (LIVER/GB) IMPRESSION: 1. Hepatic steatosis. 2. No sonographic findings of acute cholecystitis. Electronically signed by: Norman Gatlin MD 06/30/2024 07:28 PM EST RP Workstation: [id number]    Assessment and Plan:   1. Prolonged QT with history of polymorphic VT and VF arrest:  - Avoid QT prolonging medications  - Maintain potassium and magnesium  at goal 4.0 and 2.0, respectively  - Monitor on telemetry - Daily EKG while admitted  - No indication for intravenous lidocaine  at this time  - Echo this admission with normal LV systolic function   2. 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 Intractable nausea/vomiting with alcohol abuse and alcoholic gastritis:  - Likely exacerbated by alcohol use  - Improved - Management per primary service   4. Hypomagnesemia:  - Repleted  - Maintain potassium and magnesium  at goal 4.0 and 2.0 respectively   5. Iron  deficiency anemia with thrombocytopenia:  - Hemoglobin mildly low, though stable  - Platelet count downtrending to 61  - Management per internal medicine  6. Elevated BP: - BP has been elevated this admission without prior diagnosis of HTN - Monitor while admitted - Not currently on antihypertensive medications     For questions or updates, please contact [facility] Please consult [email or web address] for contact info under Cardiology/STEMI.   Signed, Bernardino Bring, PA-C [facility] Pager: [phone] 07/03/2024, 9:22 AM       [1]  Allergies Allergen Reactions   Inapsine  [Droperidol ] Anaphylaxis   Zofran  [Ondansetron ] Other (See Comments)     Prolonged QT   "

## 2024-07-03 NOTE — Consult Note (Signed)
 PHARMACY CONSULT NOTE - ELECTROLYTES  Pharmacy Consult for Electrolyte Monitoring and Replacement   Recent Labs: Height: 5' 1 (154.9 cm) Weight: 50 kg (110 lb 3.7 oz) IBW/kg (Calculated) : 47.8 Estimated Creatinine Clearance: 69.8 mL/min (by C-G formula based on SCr of 0.5 mg/dL). Potassium (mmol/L)  Date Value  07/03/2024 4.2   Magnesium  (mg/dL)  Date Value  87/76/7974 1.9   Calcium  (mg/dL)  Date Value  87/76/7974 8.9   Albumin (g/dL)  Date Value  87/76/7974 4.1  02/05/2021 4.9 (H)   Phosphorus (mg/dL)  Date Value  87/76/7974 2.7   Sodium (mmol/L)  Date Value  07/03/2024 134 (L)  02/05/2021 141    Assessment  Yvette [name] is a 43 y.o. female presenting with nausea and vomiting. PMH significant for cardiomyopathy, Kawasaki disease, SVT, and neutropenia. Pharmacy has been consulted to monitor and replace electrolytes.  Diet: Soft foods MIVF: n/a Pertinent medications: n/a  Goal of Therapy: Electrolytes WNL  Plan:  No electrolyte replacement warranted Check BMP, Mg, Phos with AM labs  Thank you for allowing pharmacy to be a part of this patient's care.  Will M. Lenon, PharmD, BCPS Clinical Pharmacist 07/03/2024 8:09 AM

## 2024-09-07 ENCOUNTER — Other Ambulatory Visit

## 2024-09-11 ENCOUNTER — Ambulatory Visit: Admitting: Internal Medicine

## 2024-09-11 ENCOUNTER — Ambulatory Visit
